# Patient Record
Sex: Male | Born: 1980 | Race: White | Hispanic: No | Smoking: Current every day smoker
Health system: Southern US, Community
[De-identification: ages and names within clinical notes are randomized; demographics above are authoritative.]

## PROBLEM LIST (undated history)

## (undated) DIAGNOSIS — K219 Gastro-esophageal reflux disease without esophagitis: Secondary | ICD-10-CM

## (undated) DIAGNOSIS — F419 Anxiety disorder, unspecified: Secondary | ICD-10-CM

## (undated) DIAGNOSIS — M1990 Unspecified osteoarthritis, unspecified site: Secondary | ICD-10-CM

## (undated) HISTORY — PX: OTHER SURGICAL HISTORY: SHX169

## (undated) HISTORY — PX: BACK SURGERY: SHX140

---

## 2006-06-14 ENCOUNTER — Ambulatory Visit: Payer: Self-pay | Admitting: Internal Medicine

## 2006-09-20 ENCOUNTER — Ambulatory Visit: Payer: Self-pay | Admitting: Physician Assistant

## 2011-12-31 ENCOUNTER — Encounter: Payer: Self-pay | Admitting: Anesthesiology

## 2013-02-21 ENCOUNTER — Ambulatory Visit: Payer: Self-pay | Admitting: Pain Medicine

## 2013-02-24 ENCOUNTER — Ambulatory Visit: Payer: Self-pay | Admitting: Pain Medicine

## 2013-03-14 ENCOUNTER — Ambulatory Visit: Payer: Self-pay | Admitting: Pain Medicine

## 2013-03-20 ENCOUNTER — Ambulatory Visit: Payer: Self-pay | Admitting: Pain Medicine

## 2013-04-18 ENCOUNTER — Ambulatory Visit: Payer: Self-pay | Admitting: Pain Medicine

## 2013-04-24 ENCOUNTER — Ambulatory Visit: Payer: Self-pay | Admitting: Pain Medicine

## 2013-05-06 HISTORY — PX: LUMBAR FUSION: SHX111

## 2013-05-16 ENCOUNTER — Ambulatory Visit: Payer: Self-pay | Admitting: Pain Medicine

## 2013-05-16 ENCOUNTER — Other Ambulatory Visit: Payer: Self-pay | Admitting: Neurosurgery

## 2013-05-23 ENCOUNTER — Encounter (HOSPITAL_COMMUNITY): Payer: Self-pay | Admitting: Pharmacy Technician

## 2013-05-23 NOTE — Pre-Procedure Instructions (Signed)
FERN ASMAR  05/23/2013   Your procedure is scheduled on:  November 24th, Monday   Report to [facility]  2 * 3 at  5:30  AM.   Call this number if you have problems the morning of surgery: [phone]   Remember:   Do not eat food or drink liquids after midnight Sunday.   Take these medicines the morning of surgery with A SIP OF WATER: Lyrica   Do not wear jewelry.  Do not wear lotions, powders, or colognes. You may wear deodorant.   Men may shave face and neck.  Do not bring valuables to the hospital.  [facility] is not responsible for any belongings or valuables.               Contacts, dentures or bridgework may not be worn into surgery.  Leave suitcase in the car. After surgery it may be brought to your room.  For patients admitted to the hospital, discharge time is determined by your treatment team.             Name and phone number of your driver:    Special Instructions: Shower using CHG 2 nights before surgery and the night before surgery.  If you shower the day of surgery use CHG.  Use special wash - you have one bottle of CHG for all showers.  You should use approximately 1/3 of the bottle for each shower.   Please read over the following fact sheets that you were given: Pain Booklet, Blood Transfusion Information, MRSA Information and Surgical Site Infection Prevention

## 2013-05-24 ENCOUNTER — Encounter (HOSPITAL_COMMUNITY): Payer: Self-pay

## 2013-05-24 ENCOUNTER — Encounter (HOSPITAL_COMMUNITY)
Admission: RE | Admit: 2013-05-24 | Discharge: 2013-05-24 | Disposition: A | Discharge status: Home / Self Care | Payer: BC Managed Care – PPO | Source: Ambulatory Visit | Attending: Neurosurgery | Admitting: Neurosurgery

## 2013-05-24 DIAGNOSIS — Z01812 Encounter for preprocedural laboratory examination: Secondary | ICD-10-CM | POA: Insufficient documentation

## 2013-05-24 DIAGNOSIS — Z01818 Encounter for other preprocedural examination: Secondary | ICD-10-CM | POA: Insufficient documentation

## 2013-05-24 HISTORY — DX: Unspecified osteoarthritis, unspecified site: M19.90

## 2013-05-24 HISTORY — DX: Anxiety disorder, unspecified: F41.9

## 2013-05-24 HISTORY — DX: Gastro-esophageal reflux disease without esophagitis: K21.9

## 2013-05-24 LAB — BASIC METABOLIC PANEL
BUN: 7 mg/dL (ref 6–23)
CO2: 26 mEq/L (ref 19–32)
Creatinine, Ser: 0.97 mg/dL (ref 0.50–1.35)
GFR calc Af Amer: >90 mL/min (ref >90)
GFR calc non Af Amer: >90 mL/min (ref >90)
Potassium: 4.6 mEq/L (ref 3.5–5.1)

## 2013-05-24 LAB — SURGICAL PCR SCREEN
MRSA, PCR: NEGATIVE
Staphylococcus aureus: NEGATIVE

## 2013-05-24 LAB — CBC
HCT: 47.4 % (ref 39.0–52.0)
MCHC: 33.8 g/dL (ref 30.0–36.0)
MCV: 87.6 fL (ref 78.0–100.0)
Platelets: 281 10*3/uL (ref 150–400)
RDW: 14 % (ref 11.5–15.5)
WBC: 9.7 10*3/uL (ref 4.0–10.5)

## 2013-05-24 LAB — TYPE AND SCREEN: ABO/RH(D): B POS

## 2013-05-24 LAB — ABO/RH: ABO/RH(D): B POS

## 2013-05-24 NOTE — Progress Notes (Signed)
Denies any cardiac issues, have never seen cardio.Marland Kitchen Has gone to Pain Clinic @ [facility].....basically healthy.  DA

## 2013-05-28 MED ORDER — CEFAZOLIN SODIUM-DEXTROSE 2-3 GM-% IV SOLR: 2.0000 g | INTRAVENOUS | Status: DC

## 2013-05-29 ENCOUNTER — Inpatient Hospital Stay (HOSPITAL_COMMUNITY)
Admission: RE | Admit: 2013-05-29 | Discharge: 2013-05-31 | DRG: 460 | Disposition: A | Discharge status: Home / Self Care | Payer: BC Managed Care – PPO | Source: Ambulatory Visit | Attending: Neurosurgery | Admitting: Neurosurgery

## 2013-05-29 ENCOUNTER — Inpatient Hospital Stay (HOSPITAL_COMMUNITY): Payer: BC Managed Care – PPO | Admitting: Anesthesiology

## 2013-05-29 ENCOUNTER — Inpatient Hospital Stay (HOSPITAL_COMMUNITY): Payer: BC Managed Care – PPO

## 2013-05-29 ENCOUNTER — Encounter (HOSPITAL_COMMUNITY)
Admission: RE | Disposition: A | Discharge status: Home / Self Care | Payer: BC Managed Care – PPO | Source: Ambulatory Visit | Attending: Neurosurgery

## 2013-05-29 ENCOUNTER — Encounter (HOSPITAL_COMMUNITY): Payer: BC Managed Care – PPO | Admitting: Anesthesiology

## 2013-05-29 ENCOUNTER — Encounter (HOSPITAL_COMMUNITY): Payer: Self-pay | Admitting: *Deleted

## 2013-05-29 DIAGNOSIS — Z981 Arthrodesis status: Secondary | ICD-10-CM

## 2013-05-29 DIAGNOSIS — M5137 Other intervertebral disc degeneration, lumbosacral region: Principal | ICD-10-CM | POA: Diagnosis present

## 2013-05-29 DIAGNOSIS — F17200 Nicotine dependence, unspecified, uncomplicated: Secondary | ICD-10-CM | POA: Diagnosis present

## 2013-05-29 DIAGNOSIS — M51379 Other intervertebral disc degeneration, lumbosacral region without mention of lumbar back pain or lower extremity pain: Principal | ICD-10-CM | POA: Diagnosis present

## 2013-05-29 DIAGNOSIS — F411 Generalized anxiety disorder: Secondary | ICD-10-CM | POA: Diagnosis present

## 2013-05-29 DIAGNOSIS — M129 Arthropathy, unspecified: Secondary | ICD-10-CM | POA: Diagnosis present

## 2013-05-29 DIAGNOSIS — M5136 Other intervertebral disc degeneration, lumbar region: Secondary | ICD-10-CM

## 2013-05-29 DIAGNOSIS — K219 Gastro-esophageal reflux disease without esophagitis: Secondary | ICD-10-CM | POA: Diagnosis present

## 2013-05-29 SURGERY — POSTERIOR LUMBAR FUSION 2 LEVEL
Anesthesia: General | Site: Back | Wound class: Clean

## 2013-05-29 MED ORDER — BACITRACIN ZINC 500 UNIT/GM EX OINT
TOPICAL_OINTMENT | CUTANEOUS | Status: DC | PRN
  Administered 2013-05-29: 1 via TOPICAL

## 2013-05-29 MED ORDER — MIDAZOLAM HCL 5 MG/5ML IJ SOLN
INTRAMUSCULAR | Status: DC | PRN
  Administered 2013-05-29 (×2): 2 mg via INTRAVENOUS

## 2013-05-29 MED ORDER — MORPHINE SULFATE 2 MG/ML IJ SOLN: 1.0000 mg | INTRAMUSCULAR | Status: DC | PRN

## 2013-05-29 MED ORDER — NEOSTIGMINE METHYLSULFATE 1 MG/ML IJ SOLN
INTRAMUSCULAR | Status: DC | PRN
  Administered 2013-05-29: 3 mg via INTRAVENOUS

## 2013-05-29 MED ORDER — BUPIVACAINE LIPOSOME 1.3 % IJ SUSP
INTRAMUSCULAR | Status: DC | PRN
  Administered 2013-05-29: 20 mL

## 2013-05-29 MED ORDER — ONDANSETRON HCL 4 MG/2ML IJ SOLN: 4.0000 mg | Freq: Four times a day (QID) | INTRAMUSCULAR | Status: DC | PRN

## 2013-05-29 MED ORDER — DOCUSATE SODIUM 100 MG PO CAPS
100.0000 mg | ORAL_CAPSULE | Freq: Two times a day (BID) | ORAL | Status: DC
  Administered 2013-05-29 – 2013-05-31 (×5): 100 mg via ORAL
  Filled 2013-05-29 (×5): qty 1

## 2013-05-29 MED ORDER — MORPHINE SULFATE (PF) 1 MG/ML IV SOLN
INTRAVENOUS | Status: DC
  Administered 2013-05-29: 13:00:00 via INTRAVENOUS
  Administered 2013-05-29: 15 mg via INTRAVENOUS
  Administered 2013-05-29: 16.5 mg via INTRAVENOUS
  Administered 2013-05-30: 17.58 mg via INTRAVENOUS
  Administered 2013-05-30: 20.41 mg via INTRAVENOUS
  Administered 2013-05-30: 9 mg via INTRAVENOUS
  Filled 2013-05-29 (×3): qty 25

## 2013-05-29 MED ORDER — HYDROMORPHONE HCL PF 1 MG/ML IJ SOLN
INTRAMUSCULAR | Status: AC
  Filled 2013-05-29: qty 1

## 2013-05-29 MED ORDER — OXYCODONE HCL 5 MG/5ML PO SOLN: 5.0000 mg | Freq: Once | ORAL | Status: AC | PRN

## 2013-05-29 MED ORDER — SODIUM CHLORIDE 0.9 % IJ SOLN: 9.0000 mL | INTRAMUSCULAR | Status: DC | PRN

## 2013-05-29 MED ORDER — NALOXONE HCL 0.4 MG/ML IJ SOLN: 0.4000 mg | INTRAMUSCULAR | Status: DC | PRN

## 2013-05-29 MED ORDER — LACTATED RINGERS IV SOLN: INTRAVENOUS | Status: DC

## 2013-05-29 MED ORDER — MORPHINE SULFATE (PF) 1 MG/ML IV SOLN
INTRAVENOUS | Status: AC
  Administered 2013-05-29: 18:00:00
  Filled 2013-05-29: qty 25

## 2013-05-29 MED ORDER — GLYCOPYRROLATE 0.2 MG/ML IJ SOLN
INTRAMUSCULAR | Status: DC | PRN
  Administered 2013-05-29: 0.4 mg via INTRAVENOUS

## 2013-05-29 MED ORDER — DIPHENHYDRAMINE HCL 50 MG/ML IJ SOLN
12.5000 mg | Freq: Four times a day (QID) | INTRAMUSCULAR | Status: DC | PRN
  Administered 2013-05-30: 12.5 mg via INTRAVENOUS
  Filled 2013-05-29: qty 1

## 2013-05-29 MED ORDER — HYDROMORPHONE HCL PF 1 MG/ML IJ SOLN
0.2500 mg | INTRAMUSCULAR | Status: DC | PRN
  Administered 2013-05-29 (×4): 0.5 mg via INTRAVENOUS

## 2013-05-29 MED ORDER — ALUM & MAG HYDROXIDE-SIMETH 200-200-20 MG/5ML PO SUSP
30.0000 mL | Freq: Four times a day (QID) | ORAL | Status: DC | PRN
  Administered 2013-05-30: 30 mL via ORAL
  Filled 2013-05-29: qty 30

## 2013-05-29 MED ORDER — ONDANSETRON HCL 4 MG/2ML IJ SOLN: 4.0000 mg | Freq: Once | INTRAMUSCULAR | Status: DC | PRN

## 2013-05-29 MED ORDER — OXYCODONE-ACETAMINOPHEN 5-325 MG PO TABS
1.0000 | ORAL_TABLET | ORAL | Status: DC | PRN
  Administered 2013-05-29 – 2013-05-31 (×6): 2 via ORAL
  Filled 2013-05-29 (×8): qty 2

## 2013-05-29 MED ORDER — LIDOCAINE HCL (CARDIAC) 20 MG/ML IV SOLN
INTRAVENOUS | Status: DC | PRN
  Administered 2013-05-29: 80 mg via INTRAVENOUS

## 2013-05-29 MED ORDER — HYDROCODONE-ACETAMINOPHEN 5-325 MG PO TABS
1.0000 | ORAL_TABLET | ORAL | Status: DC | PRN
  Administered 2013-05-30 – 2013-05-31 (×2): 2 via ORAL
  Filled 2013-05-29 (×2): qty 2

## 2013-05-29 MED ORDER — PHENOL 1.4 % MT LIQD: 1.0000 | OROMUCOSAL | Status: DC | PRN

## 2013-05-29 MED ORDER — BUPIVACAINE-EPINEPHRINE PF 0.5-1:200000 % IJ SOLN
INTRAMUSCULAR | Status: DC | PRN
  Administered 2013-05-29: 10 mL

## 2013-05-29 MED ORDER — VANCOMYCIN HCL 10 G IV SOLR
1500.0000 mg | Freq: Two times a day (BID) | INTRAVENOUS | Status: DC
  Administered 2013-05-29 – 2013-05-31 (×4): 1500 mg via INTRAVENOUS
  Filled 2013-05-29 (×6): qty 1500

## 2013-05-29 MED ORDER — VECURONIUM BROMIDE 10 MG IV SOLR
INTRAVENOUS | Status: DC | PRN
  Administered 2013-05-29 (×2): 8 mg via INTRAVENOUS

## 2013-05-29 MED ORDER — THROMBIN 20000 UNITS EX SOLR
CUTANEOUS | Status: DC | PRN
  Administered 2013-05-29: 08:00:00 via TOPICAL

## 2013-05-29 MED ORDER — OXYCODONE HCL 5 MG PO TABS
ORAL_TABLET | ORAL | Status: AC
  Filled 2013-05-29: qty 1

## 2013-05-29 MED ORDER — VANCOMYCIN HCL IN DEXTROSE 1-5 GM/200ML-% IV SOLN
INTRAVENOUS | Status: AC
  Administered 2013-05-29: 1000 mg via INTRAVENOUS
  Filled 2013-05-29: qty 200

## 2013-05-29 MED ORDER — DIAZEPAM 5 MG PO TABS
ORAL_TABLET | ORAL | Status: AC
  Administered 2013-05-30: 5 mg via ORAL
  Filled 2013-05-29: qty 1

## 2013-05-29 MED ORDER — PREGABALIN 75 MG PO CAPS
75.0000 mg | ORAL_CAPSULE | Freq: Two times a day (BID) | ORAL | Status: DC
  Filled 2013-05-29 (×4): qty 1

## 2013-05-29 MED ORDER — SODIUM CHLORIDE 0.9 % IR SOLN
Status: DC | PRN
  Administered 2013-05-29: 08:00:00

## 2013-05-29 MED ORDER — DEXAMETHASONE SODIUM PHOSPHATE 10 MG/ML IJ SOLN
INTRAMUSCULAR | Status: AC
  Administered 2013-05-29: 10 mg via INTRAVENOUS
  Filled 2013-05-29: qty 1

## 2013-05-29 MED ORDER — 0.9 % SODIUM CHLORIDE (POUR BTL) OPTIME
TOPICAL | Status: DC | PRN
  Administered 2013-05-29: 1000 mL

## 2013-05-29 MED ORDER — ROCURONIUM BROMIDE 100 MG/10ML IV SOLN
INTRAVENOUS | Status: DC | PRN
  Administered 2013-05-29: 50 mg via INTRAVENOUS

## 2013-05-29 MED ORDER — DIAZEPAM 5 MG PO TABS
5.0000 mg | ORAL_TABLET | Freq: Four times a day (QID) | ORAL | Status: DC | PRN
Start: 2013-05-29 — End: 2013-05-31
  Administered 2013-05-29 – 2013-05-31 (×6): 5 mg via ORAL
  Filled 2013-05-29 (×5): qty 1

## 2013-05-29 MED ORDER — PHENYLEPHRINE HCL 10 MG/ML IJ SOLN
INTRAMUSCULAR | Status: DC | PRN
  Administered 2013-05-29 (×2): 80 ug via INTRAVENOUS
  Administered 2013-05-29: 120 ug via INTRAVENOUS

## 2013-05-29 MED ORDER — ONDANSETRON HCL 4 MG/2ML IJ SOLN
INTRAMUSCULAR | Status: DC | PRN
  Administered 2013-05-29: 4 mg via INTRAVENOUS

## 2013-05-29 MED ORDER — DIPHENHYDRAMINE HCL 12.5 MG/5ML PO ELIX
12.5000 mg | ORAL_SOLUTION | Freq: Four times a day (QID) | ORAL | Status: DC | PRN
  Filled 2013-05-29: qty 10

## 2013-05-29 MED ORDER — ARTIFICIAL TEARS OP OINT
TOPICAL_OINTMENT | OPHTHALMIC | Status: DC | PRN
  Administered 2013-05-29: 1 via OPHTHALMIC

## 2013-05-29 MED ORDER — ONDANSETRON HCL 4 MG/2ML IJ SOLN: 4.0000 mg | INTRAMUSCULAR | Status: DC | PRN

## 2013-05-29 MED ORDER — PROPOFOL 10 MG/ML IV BOLUS
INTRAVENOUS | Status: DC | PRN
  Administered 2013-05-29: 200 mg via INTRAVENOUS

## 2013-05-29 MED ORDER — BUPIVACAINE LIPOSOME 1.3 % IJ SUSP
20.0000 mL | INTRAMUSCULAR | Status: AC
  Filled 2013-05-29: qty 20

## 2013-05-29 MED ORDER — LACTATED RINGERS IV SOLN
INTRAVENOUS | Status: DC | PRN
  Administered 2013-05-29 (×4): via INTRAVENOUS

## 2013-05-29 MED ORDER — FENTANYL CITRATE 0.05 MG/ML IJ SOLN
INTRAMUSCULAR | Status: DC | PRN
  Administered 2013-05-29: 100 ug via INTRAVENOUS
  Administered 2013-05-29: 50 ug via INTRAVENOUS
  Administered 2013-05-29: 150 ug via INTRAVENOUS
  Administered 2013-05-29 (×4): 100 ug via INTRAVENOUS
  Administered 2013-05-29: 50 ug via INTRAVENOUS
  Administered 2013-05-29 (×2): 100 ug via INTRAVENOUS
  Administered 2013-05-29: 50 ug via INTRAVENOUS

## 2013-05-29 MED ORDER — MENTHOL 3 MG MT LOZG: 1.0000 | LOZENGE | OROMUCOSAL | Status: DC | PRN

## 2013-05-29 MED ORDER — OXYCODONE HCL 5 MG PO TABS
5.0000 mg | ORAL_TABLET | Freq: Once | ORAL | Status: AC | PRN
  Administered 2013-05-29: 5 mg via ORAL

## 2013-05-29 MED FILL — Heparin Sodium (Porcine) Inj 1000 Unit/ML: INTRAMUSCULAR | Qty: 30 | Status: AC

## 2013-05-29 MED FILL — Sodium Chloride IV Soln 0.9%: INTRAVENOUS | Qty: 1000 | Status: AC

## 2013-05-29 SURGICAL SUPPLY — 74 items
APL SKNCLS STERI-STRIP NONHPOA (GAUZE/BANDAGES/DRESSINGS) ×1
BAG DECANTER FOR FLEXI CONT (MISCELLANEOUS) ×2 IMPLANT
BENZOIN TINCTURE PRP APPL 2/3 (GAUZE/BANDAGES/DRESSINGS) ×2 IMPLANT
BLADE SURG ROTATE 9660 (MISCELLANEOUS) IMPLANT
BRUSH SCRUB EZ PLAIN DRY (MISCELLANEOUS) ×2 IMPLANT
BUR ACORN 6.0 (BURR) ×2 IMPLANT
BUR MATCHSTICK NEURO 3.0 LAGG (BURR) ×2 IMPLANT
CANISTER SUCT 3000ML (MISCELLANEOUS) ×2 IMPLANT
CAP REVERE LOCKING (Cap) ×6 IMPLANT
CONT SPEC 4OZ CLIKSEAL STRL BL (MISCELLANEOUS) ×3 IMPLANT
COVER BACK TABLE 24X17X13 BIG (DRAPES) IMPLANT
COVER TABLE BACK 60X90 (DRAPES) ×2 IMPLANT
DRAPE C-ARM 42X72 X-RAY (DRAPES) ×4 IMPLANT
DRAPE LAPAROTOMY 100X72X124 (DRAPES) ×2 IMPLANT
DRAPE POUCH INSTRU U-SHP 10X18 (DRAPES) ×2 IMPLANT
DRAPE PROXIMA HALF (DRAPES) ×1 IMPLANT
DRAPE SURG 17X23 STRL (DRAPES) ×8 IMPLANT
ELECT BLADE 4.0 EZ CLEAN MEGAD (MISCELLANEOUS) ×2
ELECT REM PT RETURN 9FT ADLT (ELECTROSURGICAL) ×2
ELECTRODE BLDE 4.0 EZ CLN MEGD (MISCELLANEOUS) ×1 IMPLANT
ELECTRODE REM PT RTRN 9FT ADLT (ELECTROSURGICAL) ×1 IMPLANT
EVACUATOR 1/8 PVC DRAIN (DRAIN) ×1 IMPLANT
GAUZE SPONGE 4X4 16PLY XRAY LF (GAUZE/BANDAGES/DRESSINGS) ×2 IMPLANT
GLOVE BIO SURGEON STRL SZ8 (GLOVE) ×1 IMPLANT
GLOVE BIO SURGEON STRL SZ8.5 (GLOVE) ×4 IMPLANT
GLOVE BIOGEL PI IND STRL 7.5 (GLOVE) IMPLANT
GLOVE BIOGEL PI IND STRL 8 (GLOVE) IMPLANT
GLOVE BIOGEL PI INDICATOR 7.5 (GLOVE) ×2
GLOVE BIOGEL PI INDICATOR 8 (GLOVE) ×4
GLOVE ECLIPSE 7.5 STRL STRAW (GLOVE) ×7 IMPLANT
GLOVE EXAM NITRILE LRG STRL (GLOVE) IMPLANT
GLOVE EXAM NITRILE MD LF STRL (GLOVE) IMPLANT
GLOVE EXAM NITRILE XL STR (GLOVE) IMPLANT
GLOVE EXAM NITRILE XS STR PU (GLOVE) IMPLANT
GLOVE INDICATOR 8.5 STRL (GLOVE) ×1 IMPLANT
GLOVE SS BIOGEL STRL SZ 8 (GLOVE) ×2 IMPLANT
GLOVE SUPERSENSE BIOGEL SZ 8 (GLOVE) ×2
GOWN BRE IMP SLV AUR LG STRL (GOWN DISPOSABLE) IMPLANT
GOWN BRE IMP SLV AUR XL STRL (GOWN DISPOSABLE) ×6 IMPLANT
GOWN STRL REIN 2XL LVL4 (GOWN DISPOSABLE) ×3 IMPLANT
KIT BASIN OR (CUSTOM PROCEDURE TRAY) ×2 IMPLANT
KIT ROOM TURNOVER OR (KITS) ×2 IMPLANT
NDL HYPO 21X1.5 SAFETY (NEEDLE) IMPLANT
NEEDLE HYPO 21X1.5 SAFETY (NEEDLE) ×2 IMPLANT
NEEDLE HYPO 22GX1.5 SAFETY (NEEDLE) ×2 IMPLANT
NS IRRIG 1000ML POUR BTL (IV SOLUTION) ×2 IMPLANT
PACK FOAM VITOSS 10CC (Orthopedic Implant) ×1 IMPLANT
PACK LAMINECTOMY NEURO (CUSTOM PROCEDURE TRAY) ×2 IMPLANT
PAD ARMBOARD 7.5X6 YLW CONV (MISCELLANEOUS) ×7 IMPLANT
PATTIES SURGICAL .5 X1 (DISPOSABLE) IMPLANT
PATTIES SURGICAL 1X1 (DISPOSABLE) ×1 IMPLANT
PUTTY ABX ACTIFUSE 20ML (Putty) ×1 IMPLANT
ROD REVERE 7.5MM (Rod) ×2 IMPLANT
SCREW 7.5X45MM (Screw) ×1 IMPLANT
SCREW 7.5X50MM (Screw) ×5 IMPLANT
SCREW REVERE 6.35 75X55MM (Screw) ×1 IMPLANT
SPACER SUSTAIN O 10X26 13MM (Spacer) ×2 IMPLANT
SPACER SUSTAIN O 10X26 8MM (Spacer) ×2 IMPLANT
SPONGE GAUZE 4X4 12PLY (GAUZE/BANDAGES/DRESSINGS) ×2 IMPLANT
SPONGE LAP 4X18 X RAY DECT (DISPOSABLE) IMPLANT
SPONGE NEURO XRAY DETECT 1X3 (DISPOSABLE) IMPLANT
SPONGE SURGIFOAM ABS GEL 100 (HEMOSTASIS) ×2 IMPLANT
STRIP CLOSURE SKIN 1/2X4 (GAUZE/BANDAGES/DRESSINGS) ×2 IMPLANT
SUT VIC AB 1 CT1 18XBRD ANBCTR (SUTURE) ×2 IMPLANT
SUT VIC AB 1 CT1 8-18 (SUTURE) ×4
SUT VIC AB 2-0 CP2 18 (SUTURE) ×4 IMPLANT
SYR 20CC LL (SYRINGE) ×1 IMPLANT
SYR 20ML ECCENTRIC (SYRINGE) ×2 IMPLANT
TAPE CLOTH SURG 4X10 WHT LF (GAUZE/BANDAGES/DRESSINGS) ×1 IMPLANT
TOWEL OR 17X24 6PK STRL BLUE (TOWEL DISPOSABLE) ×2 IMPLANT
TOWEL OR 17X26 10 PK STRL BLUE (TOWEL DISPOSABLE) ×2 IMPLANT
TRAY FOLEY CATH 14FRSI W/METER (CATHETERS) ×1 IMPLANT
TRAY FOLEY CATH 16FRSI W/METER (SET/KITS/TRAYS/PACK) ×1 IMPLANT
WATER STERILE IRR 1000ML POUR (IV SOLUTION) ×2 IMPLANT

## 2013-05-29 NOTE — Plan of Care (Signed)
Problem: Consults Goal: Diagnosis - Spinal Surgery Fusion

## 2013-05-29 NOTE — Progress Notes (Signed)
ANTIBIOTIC CONSULT NOTE - INITIAL  Pharmacy Consult:  Vancomycin Indication:  Surgical prophylaxis  Allergies  Allergen Reactions  . Amoxicillin Hives and Rash    Patient Measurements: Height: 5' 10.08" (178 cm) Weight: 160 lb 0.9 oz (72.6 kg) IBW/kg (Calculated) : 73.18  Vital Signs: Temp: 96.8 F (36 C) (11/24 1507) Temp src: Oral (11/24 0616) BP: 114/58 mmHg (11/24 1438) Pulse Rate: 96 (11/24 1507) Intake/Output from this shift: Total I/O In: 3500 [I.V.:3500] Out: 1380 [Urine:950; Drains:30; Blood:400]  Labs: No results found for this basename: WBC, HGB, PLT, LABCREA, CREATININE,  in the last 72 hours Estimated Creatinine Clearance: 110.2 ml/min (by C-G formula based on Cr of 0.97). No results found for this basename: VANCOTROUGH, Leodis Binet, VANCORANDOM, GENTTROUGH, GENTPEAK, GENTRANDOM, TOBRATROUGH, TOBRAPEAK, TOBRARND, AMIKACINPEAK, AMIKACINTROU, AMIKACIN,  in the last 72 hours   Microbiology: Recent Results (from the past 720 hour(s))  SURGICAL PCR SCREEN     Status: None   Collection Time    05/24/13  9:18 AM      Result Value Range Status   MRSA, PCR NEGATIVE  NEGATIVE Final   Staphylococcus aureus NEGATIVE  NEGATIVE Final   Comment:            The Xpert SA Assay (FDA     approved for NASAL specimens     in patients over 54 years of age),     is one component of     a comprehensive surveillance     program.  Test performance has     been validated by [employer] for patients greater     than or equal to 1 year old.     It is not intended     to diagnose infection nor to     guide or monitor treatment.    Medical History: Past Medical History  Diagnosis Date  . GERD (gastroesophageal reflux disease)     TAKES OTC  . Arthritis   . Anxiety     IN THE PAST      Assessment: 32 YOM with previous back surgery in 2006 presented with persistent back pain and subsequently undergo lumbar laminectomies and interbody fusion, POD#0.  Pharmacy  consulted to manage vancomycin for surgical prophylaxis while Hemovac still present.  Estimated CrCL 110 ml/min.  Patient received pre-op vancomycin at 0730 today.   Goal of Therapy:  Vancomycin trough level 10-15 mcg/ml   Plan:  - Vanc 1500mg  IV Q12H - Monitor renal fxn, clinical course - F/U Hemovac removal to d/c vanc    Aleksei Goodlin D. Laney Potash, PharmD, BCPS Pager:  [phone] 05/29/2013, 4:08 PM

## 2013-05-29 NOTE — Anesthesia Postprocedure Evaluation (Signed)
  Anesthesia Post-op Note  Patient: Derek [name]  Procedure(s) Performed: Procedure(s) with comments: Lumbar four-five, Lumbar five-Sacral one laminectomies with posterior lumbar interbody fusion with interbody prosthesis, posterior lateral arthrodesis and posterior segmental instrumentation (N/A) - Lumbar four-five, Lumbar five-Sacral one laminectomies with posterior lumbar interbody fusion with interbody prosthesis, posterior lateral arthrodesis and posterior segmental instrumentation  Patient Location: PACU  Anesthesia Type:General  Level of Consciousness: awake, alert  and oriented  Airway and Oxygen Therapy: Patient Spontanous Breathing and Patient connected to nasal cannula oxygen  Post-op Pain: mild  Post-op Assessment: Post-op Vital signs reviewed  Post-op Vital Signs: Reviewed  Complications: No apparent anesthesia complications

## 2013-05-29 NOTE — Anesthesia Preprocedure Evaluation (Signed)
Anesthesia Evaluation  Patient identified by MRN, date of birth, ID band Patient awake    Reviewed: Allergy & Precautions, H&P , NPO status , Patient's Chart, lab work & pertinent test results  Airway Mallampati: I TM Distance: >3 FB Neck ROM: Full    Dental  (+) Teeth Intact and Dental Advisory Given   Pulmonary Current Smoker,  breath sounds clear to auscultation        Cardiovascular Rhythm:Regular Rate:Normal     Neuro/Psych    GI/Hepatic GERD-  Medicated and Controlled,  Endo/Other    Renal/GU      Musculoskeletal   Abdominal   Peds  Hematology   Anesthesia Other Findings   Reproductive/Obstetrics                           Anesthesia Physical Anesthesia Plan  ASA: I  Anesthesia Plan: General   Post-op Pain Management:    Induction: Intravenous  Airway Management Planned: Oral ETT  Additional Equipment:   Intra-op Plan:   Post-operative Plan: Extubation in OR  Informed Consent: I have reviewed the patients History and Physical, chart, labs and discussed the procedure including the risks, benefits and alternatives for the proposed anesthesia with the patient or authorized representative who has indicated his/her understanding and acceptance.   Dental advisory given  Plan Discussed with: CRNA, Anesthesiologist and Surgeon  Anesthesia Plan Comments:         Anesthesia Quick Evaluation

## 2013-05-29 NOTE — H&P (Signed)
Subjective: The patient is a 32 year old white male who's had prior back surgery by another physician in 2006. He has had persistent back pain. He has failed extensive nonsurgical management. He was worked up with a lumbar MRI which demonstrated disc degeneration at L4-5 and L5-S1. I discussed the various treatment options with him including surgery. The patient has weighed the risks, benefits, and alternatives surgery and decided proceed with an L4-5 and L5-S1 decompression, instrumentation, and fusion.   Past Medical History  Diagnosis Date  . GERD (gastroesophageal reflux disease)     TAKES OTC  . Arthritis   . Anxiety     IN THE PAST    Past Surgical History  Procedure Laterality Date  . Back surgery      LUMBAR LAMINECTOMY  . Hand reimplanted      LEFT HAND 1985    Allergies  Allergen Reactions  . Amoxicillin Hives and Rash    History  Substance Use Topics  . Smoking status: Current Every Day Smoker -- 1.00 packs/day for 16 years  . Smokeless tobacco: Not on file  . Alcohol Use: Yes     Comment: VERY RARE, SOCIAL    History reviewed. No pertinent family history. Prior to Admission medications   Medication Sig Start Date End Date Taking? Authorizing Provider  cyclobenzaprine (FLEXERIL) 10 MG tablet Take 10 mg by mouth daily as needed for muscle spasms.   Yes Historical Provider, MD  pregabalin (LYRICA) 75 MG capsule Take 75 mg by mouth 2 (two) times daily.   Yes Historical Provider, MD     Review of Systems  Positive ROS: As above  All other systems have been reviewed and were otherwise negative with the exception of those mentioned in the HPI and as above.  Objective: Vital signs in last 24 hours: Temp:  [98.2 F (36.8 C)] 98.2 F (36.8 C) (11/24 0616) Resp:  [16] 16 (11/24 0616) BP: (122)/(58) 122/58 mmHg (11/24 0616) SpO2:  [98 %] 98 % (11/24 0616)  General Appearance: Alert, cooperative, no distress, appears stated age Head: Normocephalic, without obvious  abnormality, atraumatic Eyes: PERRL, conjunctiva/corneas clear, EOM's intact, fundi benign, both eyes      Ears: Normal TM's and external ear canals, both ears Throat: Lips, mucosa, and tongue normal; teeth and gums normal Neck: Supple, symmetrical, trachea midline, no adenopathy; thyroid: No enlargement/tenderness/nodules; no carotid bruit or JVD Back: Symmetric, no curvature, ROM normal, no CVA tenderness. The patient's lumbar incision is well-healed. Lungs: Clear to auscultation bilaterally, respirations unlabored Heart: Regular rate and rhythm, S1 and S2 normal, no murmur, rub or gallop Abdomen: Soft, non-tender, bowel sounds active all four quadrants, no masses, no organomegaly Extremities: Extremities normal, atraumatic, no cyanosis or edema Pulses: 2+ and symmetric all extremities Skin: Skin color, texture, turgor normal, no rashes or lesions  NEUROLOGIC:   Mental status: alert and oriented, no aphasia, good attention span, Fund of knowledge/ memory ok Motor Exam - grossly normal Sensory Exam - grossly normal Reflexes:  Coordination - grossly normal Gait - grossly normal Balance - grossly normal Cranial Nerves: I: smell Not tested  II: visual acuity  OS: Normal    OD: Normal   II: visual fields Full to confrontation  II: pupils Equal, round, reactive to light  III,VII: ptosis None  III,IV,VI: extraocular muscles  Full ROM  V: mastication Normal  V: facial light touch sensation  Normal  V,VII: corneal reflex  Present  VII: facial muscle function - upper  Normal  VII: facial  muscle function - lower Normal  VIII: hearing Not tested  IX: soft palate elevation  Normal  IX,X: gag reflex Present  XI: trapezius strength  5/5  XI: sternocleidomastoid strength 5/5  XI: neck flexion strength  5/5  XII: tongue strength  Normal    Data Review Lab Results  Component Value Date   WBC 9.7 05/24/2013   HGB 16.0 05/24/2013   HCT 47.4 05/24/2013   MCV 87.6 05/24/2013   PLT 281  05/24/2013   Lab Results  Component Value Date   NA 141 05/24/2013   K 4.6 05/24/2013   CL 105 05/24/2013   CO2 26 05/24/2013   BUN 7 05/24/2013   CREATININE 0.97 05/24/2013   GLUCOSE 84 05/24/2013   No results found for this basename: INR, PROTIME    Assessment/Plan: 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 I have reviewed the MR scan with them and pointed out the abnormalities. We have discussed the various treatment options including surgery. I have described the surgical treatment option of a L4-5 and L5-S1 decompression, agitation, and fusion. I have shown him surgical models. We have discussed the risks, benefits, alternatives, and likelihood of achieving our goals with surgery. I have answered all the patient's questions. He has decided to proceed with surgery.   Ebunoluwa Gernert,Ericson D 05/29/2013 7:18 AM

## 2013-05-29 NOTE — Transfer of Care (Signed)
Immediate Anesthesia Transfer of Care Note  Patient: Derek [name]  Procedure(s) Performed: Procedure(s) with comments: Lumbar four-five, Lumbar five-Sacral one laminectomies with posterior lumbar interbody fusion with interbody prosthesis, posterior lateral arthrodesis and posterior segmental instrumentation (N/A) - Lumbar four-five, Lumbar five-Sacral one laminectomies with posterior lumbar interbody fusion with interbody prosthesis, posterior lateral arthrodesis and posterior segmental instrumentation  Patient Location: PACU  Anesthesia Type:General  Level of Consciousness: sedated, patient cooperative and responds to stimulation  Airway & Oxygen Therapy: Patient Spontanous Breathing and Patient connected to nasal cannula oxygen  Post-op Assessment: Report given to PACU RN, Post -op Vital signs reviewed and stable, Patient moving all extremities and Patient moving all extremities X 4  Post vital signs: Reviewed and stable  Complications: No apparent anesthesia complications

## 2013-05-29 NOTE — Op Note (Signed)
Brief history: The patient is a 32 year old white male who has had prior lumbar laminectomy and discectomy by the physician years ago. He has had persistent back pain and has failed extensive nonsurgical management. He was worked up with a lumbar MRI which demonstrated this degeneration at L4-5 and L5-S1. I discussed the various treatment options with the patient including surgery. He has weighed the risks, benefits, and alternatives surgery and decided proceed with an L4-5 and L5-S1 decompression, agitation, and fusion.  Preoperative diagnosis: L4-5 and L5-S1 Degenerative disc disease, lumbago; lumbar radiculopathy; L4-5 and L5-S1 facet arthropathy, stenosis  Postoperative diagnosis: The same  Procedure: Bilateral L4 and L5 Laminotomy/foraminotomies to decompress the bilateral L4, L5 and S1 nerve roots(the work required to do this was in addition to the work required to do the posterior lumbar interbody fusion because of the patient's spinal stenosis, facet arthropathy. Etc. requiring a wide decompression of the nerve roots.); L4-5 and L5-S1 posterior lumbar interbody fusion with local morselized autograft bone and Actifusebone graft extender; insertion of interbody prosthesis at L4-5 and L5-S1 (globus peek interbody prosthesis); posterior segmental instrumentation from L4 to S1 with globus titanium pedicle screws and rods; posterior lateral arthrodesis at L4-5 and L5-S1 with local morselized autograft bone and Vitoss bone graft extender.  Surgeon: Dr. Delma Officer  Asst.: Dr. Jillyn Hidden cram  Anesthesia: Gen. endotracheal  Estimated blood loss: 200 cc  Drains: One medium Hemovac  Complications: None  Description of procedure: The patient was brought to the operating room by the anesthesia team. General endotracheal anesthesia was induced. The patient was turned to the prone position on the Wilson frame. The patient's lumbosacral region was then prepared with Betadine scrub and Betadine solution.  Sterile drapes were applied.  I then injected the area to be incised with Marcaine with epinephrine solution. I then used the scalpel to make a linear midline incision over the L4-5 and L5-S1 interspace, incising through the patient's prior surgical scar. I then used electrocautery to perform a bilateral subperiosteal dissection exposing the spinous process and lamina of L3-S1. We then obtained intraoperative radiograph to confirm our location. We then inserted the Verstrac retractor to provide exposure.  I began the decompression by using the high speed drill to perform laminotomies at L4 and L5 bilaterally. We did encounter scar tissue from the prior operation at L5 S1-1 left. We then used the Kerrison punches to widen the laminotomy and removed the ligamentum flavum at L4-5 and L5-S1 bilaterally. We carefully dissected through the epidural scar tissue at L5-S1 and performed a foraminotomy about the left S1 nerve root.. We used the Kerrison punches to remove the medial facets at L4-5 and L5-S1 bilaterally. We performed wide foraminotomies about the bilateral L4, L5 and S1 nerve roots completing the decompression.  We now turned our attention to the posterior lumbar interbody fusion. I used a scalpel to incise the intervertebral disc at L4-5 and L5-S1. I then performed a partial intervertebral discectomy at L4-5 and L5-S1 using the pituitary forceps. We prepared the vertebral endplates at L4-5 and L5-S1 for the fusion by removing the soft tissues with the curettes. We then used the trial spacers to pick the appropriate sized interbody prosthesis. We prefilled his prosthesis with a combination of local morselized autograft bone that we obtained during the decompression as well as Actifuse bone graft extender. We inserted the prefilled prosthesis into the interspace at L4-5 and L5-S1. There was a good snug fit of the prosthesis in the interspace. We then filled and the  remainder of the intervertebral disc space  with local morselized autograft bone and Actifuse. This completed the posterior lumbar interbody arthrodesis.  We now turned attention to the instrumentation. Under fluoroscopic guidance we cannulated the bilateral L4, L5 and S1 pedicles with the bone probe. We then removed the bone probe. We then tapped the pedicle with a 0.5 millimeter tap. We then removed the tap. We probed inside the tapped pedicle with a ball probe to rule out cortical breaches. We then inserted a 7.5 x 50 and 55 millimeter pedicle screw into the L4, L5 and S1 pedicles bilaterally under fluoroscopic guidance. We then palpated along the medial aspect of the pedicles to rule out cortical breaches. There were none. The nerve roots were not injured. We then connected the unilateral pedicle screws with a lordotic rod. We compressed the construct and secured the rod in place with the caps. We then tightened the caps appropriately. This completed the instrumentation from L4-S1.  We now turned our attention to the posterior lateral arthrodesis at L4-5 and L5-S1. We used the high-speed drill to decorticate the remainder of the facets, pars, transverse process at L4-5 and L5-S1. We then applied a combination of local morselized autograft bone and Vitoss bone graft extender over these decorticated posterior lateral structures. This completed the posterior lateral arthrodesis.  We then obtained hemostasis using bipolar electrocautery. We irrigated the wound out with bacitracin solution. We inspected the thecal sac and nerve roots and noted they were well decompressed. We then removed the retractor. We placed a medium Hemovac drain in the epidural space and tunneled out through separate stab wound. We reapproximated patient's thoracolumbar fascia with interrupted #1 Vicryl suture. We reapproximated patient's subcutaneous tissue with interrupted 2-0 Vicryl suture. The reapproximated patient's skin with Steri-Strips and benzoin. The wound was then  coated with bacitracin ointment. A sterile dressing was applied. The drapes were removed. The patient was subsequently returned to the supine position where they were extubated by the anesthesia team. He was then transported to the post anesthesia care unit in stable condition. All sponge instrument and needle counts were reportedly correct at the end of this case.

## 2013-05-29 NOTE — Progress Notes (Signed)
Patient ID: Derek [name], male   DOB: 1981/05/24, 32 y.o.   MRN: [id number] Subjective:  The patient is somnolent but easily arousable. He is in no apparent distress.  Objective: Vital signs in last 24 hours: Temp:  [96.8 F (36 C)-98.2 F (36.8 C)] 96.8 F (36 C) (11/24 1217) Resp:  [16] 16 (11/24 0616) BP: (122)/(58) 122/58 mmHg (11/24 0616) SpO2:  [98 %] 98 % (11/24 0616)  Intake/Output from previous day:   Intake/Output this shift: Total I/O In: 3500 [I.V.:3500] Out: 1150 [Urine:750; Blood:400]  Physical exam the patient is somnolent but arousable. He is moving all 4 extremities well.  Lab Results: No results found for this basename: WBC, HGB, HCT, PLT,  in the last 72 hours BMET No results found for this basename: NA, K, CL, CO2, GLUCOSE, BUN, CREATININE, CALCIUM,  in the last 72 hours  Studies/Results: Dg Lumbar Spine 1 View  05/29/2013   CLINICAL DATA:  L4-L5 and L5-S1 lumbar surgery  EXAM: LUMBAR SPINE - 1 VIEW  COMPARISON:  MRI lumbar spine 11/06/2008  FINDINGS: Prior MRI labeled with 5 lumbar vertebrae.  Cross-table lateral view #1 at 0806 hr interpreted at 1156 hr.  Metallic probe via posterior approach projects dorsal to the L5-S1 disc space level.  Tissue spreaders and a surgical sponge are identified dorsal to L4 and L5.  IMPRESSION: Dorsal localization of the L5-S1 disc space level.   Electronically Signed   By: Ulyses Southward M.D.   On: 05/29/2013 11:57    Assessment/Plan: The patient is doing well.  LOS: 0 days     Calissa Swenor,Srihith D 05/29/2013, 12:24 PM

## 2013-05-30 LAB — BASIC METABOLIC PANEL
BUN: 7 mg/dL (ref 6–23)
GFR calc Af Amer: >90 mL/min (ref >90)
GFR calc non Af Amer: >90 mL/min (ref >90)
Glucose, Bld: 131 mg/dL — ABNORMAL HIGH (ref 70–99)
Potassium: 4.3 mEq/L (ref 3.5–5.1)
Sodium: 143 mEq/L (ref 135–145)

## 2013-05-30 LAB — CBC
Hemoglobin: 11.8 g/dL — ABNORMAL LOW (ref 13.0–17.0)
MCH: 29.3 pg (ref 26.0–34.0)
MCHC: 33.6 g/dL (ref 30.0–36.0)
Platelets: 274 10*3/uL (ref 150–400)
WBC: 27.2 10*3/uL — ABNORMAL HIGH (ref 4.0–10.5)

## 2013-05-30 MED ORDER — PANTOPRAZOLE SODIUM 40 MG PO TBEC
40.0000 mg | DELAYED_RELEASE_TABLET | Freq: Every day | ORAL | Status: DC
  Administered 2013-05-30 – 2013-05-31 (×2): 40 mg via ORAL
  Filled 2013-05-30: qty 1

## 2013-05-30 MED ORDER — MORPHINE SULFATE 2 MG/ML IJ SOLN
2.0000 mg | INTRAMUSCULAR | Status: DC | PRN
  Administered 2013-05-30: 2 mg via INTRAVENOUS
  Filled 2013-05-30: qty 1

## 2013-05-30 NOTE — Progress Notes (Signed)
UR complete.  Braniya Farrugia RN, MSN 

## 2013-05-30 NOTE — Evaluation (Signed)
Occupational Therapy Evaluation Patient Details Name: Derek [name] MRN: [id number] DOB: Jan 09, 1981 Today's Date: 05/30/2013 Time: 0272-5366 OT Time Calculation (min): 30 min  OT Assessment / Plan / Recommendation History of present illness pt presents with L4-S1 PLIF.   Clinical Impression   Pt presents with below problem list. Pt moving well during evaluation. Pt will benefit from acute OT to increase independence prior to d/c.    OT Assessment  Patient needs continued OT Services    Follow Up Recommendations  No OT follow up    Barriers to Discharge      Equipment Recommendations  None recommended by OT    Recommendations for Other Services    Frequency  Min 2X/week    Precautions / Restrictions Precautions Precautions: Back;Fall Precaution Booklet Issued: No Precaution Comments: Pt able to state 2/3 back precautions. Required Braces or Orthoses: Spinal Brace Spinal Brace: Lumbar corset;Applied in sitting position Restrictions Weight Bearing Restrictions: No   Pertinent Vitals/Pain Pain 8/10 in back. Increased activity during session.     ADL  Grooming: Wash/dry hands;Min guard (pt bending at knees squatting and then leaning over) Where Assessed - Grooming: Supported standing Upper Body Dressing: Supervision/safety;Set up Where Assessed - Upper Body Dressing: Unsupported sitting Lower Body Dressing: Minimal assistance Where Assessed - Lower Body Dressing: Unsupported sit to stand Toilet Transfer: Supervision/safety Toilet Transfer Method: Sit to stand Toilet Transfer Equipment: Regular height toilet;Bedside commode (stood at comfort height; sit to stand from simulated regular) Toileting - Architect and Hygiene: Supervision/safety Where Assessed - Engineer, mining and Hygiene: Standing Tub/Shower Transfer: Supervision/safety Tub/Shower Transfer Method: Science writer: Walk in shower Equipment Used: Back  brace;Reacher;Long-handled sponge;Long-handled shoe horn;Sock aid Transfers/Ambulation Related to ADLs: Supervision ADL Comments: Pt appeared to be bending when trying to don sock, so educated on AE for LB ADLs. Pt practiced with sockaid. Educated on having grooming items on right side of sink and also using cups for teeth care to avoid breaking precautions. Educated to sit to get LB clothing over feet. Recommended having someone with him when showering and getting in and out of shower. Recommended sitting to bathe. Educated on toilet aid for hygiene. Legs shaky when practicing simulated regular height toilet transfer, so practiced on [facility] as well.     OT Diagnosis: Acute pain  OT Problem List: Decreased knowledge of use of DME or AE;Decreased knowledge of precautions;Pain;Decreased activity tolerance;Impaired balance (sitting and/or standing);Decreased range of motion OT Treatment Interventions: [name]-care/ADL training;DME and/or AE instruction;Therapeutic activities;Patient/family education;Balance training   OT Goals(Current goals can be found in the care plan section) Acute Rehab OT Goals Patient Stated Goal: not stated OT Goal Formulation: With patient Time For Goal Achievement: 06/06/13 Potential to Achieve Goals: Good ADL Goals Pt Will Perform Lower Body Dressing: with modified independence;with adaptive equipment;sit to/from stand Pt Will Transfer to Toilet: with modified independence;ambulating;regular height toilet Pt Will Perform Toileting - Clothing Manipulation and hygiene: with modified independence;sit to/from stand Additional ADL Goal #1: Pt will independently verbalize and demonstrate 3/3 back precautions. Additional ADL Goal #2: Pt will independently perform bed mobility while maintaining back precautions as precursor for ADLs.    Visit Information  Last OT Received On: 05/30/13 Assistance Needed: +1 History of Present Illness: pt presents with L4-S1 PLIF.       Prior  Functioning     Home Living Family/patient expects to be discharged to:: Private residence Living Arrangements: Spouse/significant other;Children Available Help at Discharge: Family;Available 24 hours/day Type of Home: House  Home Access: Stairs to enter Entrance Stairs-Number of Steps: 3 Entrance Stairs-Rails: Right Home Layout: One level Home Equipment: None (thinks he can borrow 3 in 1 if needed) Prior Function Level of Independence: Independent Communication Communication: No difficulties Dominant Hand: Right         Vision/Perception     Cognition  Cognition Arousal/Alertness: Awake/alert Behavior During Therapy: WFL for tasks assessed/performed Overall Cognitive Status: Within Functional Limits for tasks assessed    Extremity/Trunk Assessment Upper Extremity Assessment Upper Extremity Assessment: Overall WFL for tasks assessed Lower Extremity Assessment Lower Extremity Assessment: Defer to PT evaluation     Mobility Bed Mobility Bed Mobility: Rolling Right;Right Sidelying to Sit Rolling Right: 5: Supervision Right Sidelying to Sit: 4: Min guard Details for Bed Mobility Assistance: Pt very close to EOB. Cues for technique. Transfers Transfers: Sit to Stand;Stand to Sit Sit to Stand: 5: Supervision;From chair/3-in-1;From toilet;From bed Stand to Sit: 5: Supervision;To chair/3-in-1;To toilet Details for Transfer Assistance: cues for technique.     Exercise     Balance Balance Balance Assessed: No   End of Session OT - End of Session Equipment Utilized During Treatment: Back brace Activity Tolerance: Patient tolerated treatment well Patient left: Other (comment) (up in room-nurse notified) Nurse Communication: Other (comment) (pt up in room-staggered a few times during session)  GO     Earlie Raveling OTR/L [phone] 05/30/2013, 3:56 PM

## 2013-05-30 NOTE — Progress Notes (Signed)
Patient ID: Derek [name], male   DOB: [birth year], 32 y.o.   MRN: [id number] Subjective:  the patient is alert and pleasant. He looks well. He is in no apparent distress.  Objective: Vital signs in last 24 hours: Temp:  [96.8 F (36 C)-98.5 F (36.9 C)] 98 F (36.7 C) (11/25 0500) Pulse Rate:  [70-117] 70 (11/25 0500) Resp:  [11-22] 16 (11/25 0500) BP: (109-139)/(53-71) 124/70 mmHg (11/25 0500) SpO2:  [89 %-100 %] 97 % (11/25 0500) Weight:  [72.6 kg (160 lb 0.9 oz)] 72.6 kg (160 lb 0.9 oz) (11/24 1507)  Intake/Output from previous day: 11/24 0701 - 11/25 0700 In: 3860 [P.O.:360; I.V.:3500] Out: 5755 [Urine:5150; Drains:205; Blood:400] Intake/Output this shift:    Physical exam patient is alert and oriented x3. His strength is grossly normal his lower extremities.  Lab Results:  Recent Labs  05/30/13 0635  WBC 27.2*  HGB 11.8*  HCT 35.1*  PLT 274   BMET  Recent Labs  05/30/13 0635  NA 143  K 4.3  CL 105  CO2 28  GLUCOSE 131*  BUN 7  CREATININE 0.87  CALCIUM 9.1    Studies/Results: Dg Lumbar Spine 2-3 Views  05/29/2013   CLINICAL DATA:  L4-L5 -S1 laminectomy with posterior interbody fusion  EXAM: LUMBAR SPINE - 2-3 VIEW  COMPARISON:  05/29/2013 at 8:06 a.m.  FINDINGS: Two intraoperative images of the lumbar spine demonstrate pedicle screws in place at L4, L5, and S1 with bilateral interbody spacer markers at L4-5 and left-sided interbody spacer markers at L5-S1. There is perhaps 2-3 mm of posterior subluxation at L4-5 on the lateral projection. No fracture observed. The posterolateral rods have not yet been placed.  IMPRESSION: 1. Pedicle screw and interbody spacers in place as noted above. Minimal posterior subluxation at L4-5, likely degenerative. No complicating feature observed.   Electronically Signed   By: Herbie Baltimore M.D.   On: 05/29/2013 14:00   Dg Lumbar Spine 1 View  05/29/2013   CLINICAL DATA:  L4-L5 and L5-S1 lumbar surgery  EXAM: LUMBAR SPINE -  1 VIEW  COMPARISON:  MRI lumbar spine 11/06/2008  FINDINGS: Prior MRI labeled with 5 lumbar vertebrae.  Cross-table lateral view #1 at 0806 hr interpreted at 1156 hr.  Metallic probe via posterior approach projects dorsal to the L5-S1 disc space level.  Tissue spreaders and a surgical sponge are identified dorsal to L4 and L5.  IMPRESSION: Dorsal localization of the L5-S1 disc space level.   Electronically Signed   By: Ulyses Southward M.D.   On: 05/29/2013 11:57    Assessment/Plan: Postop day #1: The patient is doing well. We will discontinue his Foley catheter, PCA pump, Hemovac drain. We will mobilize him with PT and OT. He may be able to go home tomorrow.   LOS: 1 day     Zakyra Kukuk,Mathayus D 05/30/2013, 7:54 AM

## 2013-05-30 NOTE — Evaluation (Signed)
Physical Therapy Evaluation Patient Details Name: Derek [name] MRN: [id number] DOB: April 29, 1981 Today's Date: 05/30/2013 Time: 1323-1400 PT Time Calculation (min): 37 min  PT Assessment / Plan / Recommendation History of Present Illness  pt presents with L4-S1 PLIF.  Clinical Impression  Pt moving great and anticipate continued great progress.  Pt ed on back precautions and donning/doffing brace.  Pt would benefit from OPPT prior to returning to work.  No further acute care PT at this time.  Will sign off.      PT Assessment  Patent does not need any further PT services    Follow Up Recommendations  No PT follow up    Does the patient have the potential to tolerate intense rehabilitation      Barriers to Discharge        Equipment Recommendations  None recommended by PT    Recommendations for Other Services     Frequency      Precautions / Restrictions Precautions Precautions: Back;Fall Precaution Booklet Issued: Yes (comment) Required Braces or Orthoses: Spinal Brace Spinal Brace: Lumbar corset;Applied in sitting position Restrictions Weight Bearing Restrictions: No   Pertinent Vitals/Pain "hiccups make my back sore"      Mobility  Bed Mobility Bed Mobility: Sit to Sidelying Right Sit to Sidelying Right: 5: Supervision;HOB elevated Details for Bed Mobility Assistance: cues for log roll technique.   Transfers Transfers: Stand to Sit Stand to Sit: 6: Modified independent (Device/Increase time);To bed;With upper extremity assist Details for Transfer Assistance: Demos good safety.   Ambulation/Gait Ambulation/Gait Assistance: 6: Modified independent (Device/Increase time) Ambulation Distance (Feet): 300 Feet Assistive device: None Ambulation/Gait Assistance Details: pt moves slowly, but demos good safety.   Gait Pattern: Step-through pattern;Decreased stride length Stairs: Yes Stairs Assistance: 5: Supervision Stairs Assistance Details (indicate cue type and  reason): Demos good safety and use of rail.   Stair Management Technique: One rail Right;Forwards Number of Stairs: 2 Wheelchair Mobility Wheelchair Mobility: No    Exercises     PT Diagnosis:    PT Problem List:   PT Treatment Interventions:       PT Goals(Current goals can be found in the care plan section)    Visit Information  Last PT Received On: 05/30/13 Assistance Needed: +1 History of Present Illness: pt presents with L4-S1 PLIF.       Prior Functioning  Home Living Family/patient expects to be discharged to:: Private residence Living Arrangements: Spouse/significant other;Children Available Help at Discharge: Family;Available 24 hours/day Type of Home: House Home Access: Stairs to enter [employer] of Steps: 3 Entrance Stairs-Rails: Right Home Layout: One level Home Equipment: None Prior Function Level of Independence: Independent Communication Communication: No difficulties    Cognition  Cognition Arousal/Alertness: Awake/alert Behavior During Therapy: WFL for tasks assessed/performed Overall Cognitive Status: Within Functional Limits for tasks assessed    Extremity/Trunk Assessment Upper Extremity Assessment Upper Extremity Assessment: Defer to OT evaluation Lower Extremity Assessment Lower Extremity Assessment: Overall WFL for tasks assessed   Balance Balance Balance Assessed: No  End of Session PT - End of Session Equipment Utilized During Treatment: Gait belt;Back brace Activity Tolerance: Patient tolerated treatment well Patient left: in bed;with call bell/phone within reach;with family/visitor present Nurse Communication: Mobility status  GP     Derek [name], [place] [phone] 05/30/2013, 2:12 PM

## 2013-05-31 MED ORDER — DSS 100 MG PO CAPS: 100.0000 mg | ORAL_CAPSULE | Freq: Two times a day (BID) | ORAL | Status: DC

## 2013-05-31 MED ORDER — OXYCODONE HCL 5 MG PO TABS
15.0000 mg | ORAL_TABLET | ORAL | Status: DC | PRN
  Administered 2013-05-31: 15 mg via ORAL
  Filled 2013-05-31: qty 3

## 2013-05-31 MED ORDER — DIAZEPAM 5 MG PO TABS: 5.0000 mg | ORAL_TABLET | Freq: Four times a day (QID) | ORAL | Status: DC | PRN

## 2013-05-31 MED ORDER — OXYCODONE HCL 15 MG PO TABS: 15.0000 mg | ORAL_TABLET | ORAL | Status: DC | PRN

## 2013-05-31 NOTE — Discharge Summary (Signed)
Physician Discharge Summary  Patient ID: Derek [name] MRN: [id number] DOB/AGE: 10-02-1980 32 y.o.  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 *   Discharged Condition: good  Hospital Course: I performed a redo L4-5 and L5-S1 laminectomy, decompression, instrumentation, and fusion on the patient on 05/29/2013. The surgery went well.  The patient's postoperative course was unremarkable. On postop day #2 the patient requested discharge to home. He was given oral and written discharge instructions. All his questions were answered.  Consults: PT/OT Significant Diagnostic Studies: None Treatments: L4-5 and L5-S1 decompression, instrumentation, and fusion. Discharge Exam: Blood pressure 121/57, pulse 90, temperature 98.9 F (37.2 C), temperature source Oral, resp. rate 18, height 5' 10.08" (1.78 m), weight 72.6 kg (160 lb 0.9 oz), SpO2 96.00%. The patient is alert and pleasant. He looks well. His strength is normal his lower extremities. His dressing is clean and dry.  Disposition: Home  Discharge Orders   Future Orders Complete By Expires   Call MD for:  difficulty breathing, headache or visual disturbances  As directed    Call MD for:  extreme fatigue  As directed    Call MD for:  hives  As directed    Call MD for:  persistant dizziness or light-headedness  As directed    Call MD for:  persistant nausea and vomiting  As directed    Call MD for:  redness, tenderness, or signs of infection (pain, swelling, redness, odor or green/yellow discharge around incision site)  As directed    Call MD for:  severe uncontrolled pain  As directed    Call MD for:  temperature >100.4  As directed    Diet - low sodium heart healthy  As directed    Discharge instructions  As directed    Comments:     Call  [phone] for a followup appointment. Take a stool softener while you are using pain medications.   Driving Restrictions  As directed    Comments:     Do not drive for 2 weeks.   Increase activity slowly  As directed    Lifting restrictions  As directed    Comments:     Do not lift more than 5 pounds. No excessive bending or twisting.   May shower / Bathe  As directed    Comments:     He may shower after the pain she is removed 3 days after surgery. Leave the incision alone.   Remove dressing in 24 hours  As directed        Medication List    STOP taking these medications       cyclobenzaprine 10 MG tablet  Commonly known as:  FLEXERIL      TAKE these medications       diazepam 5 MG tablet  Commonly known as:  VALIUM  Take 1 tablet (5 mg total) by mouth every 6 (six) hours as needed for muscle spasms.     DSS 100 MG Caps  Take 100 mg by mouth 2 (two) times daily.     oxyCODONE 15 MG immediate release tablet  Commonly known as:  ROXICODONE  Take 1 tablet (15 mg total) by mouth every 4 (four) hours as needed for severe pain.     pregabalin 75 MG capsule  Commonly known as:  LYRICA  Take 75 mg by mouth 2 (  two) times daily.         SignedCristi Loron 05/31/2013, 12:08 PM

## 2013-05-31 NOTE — Progress Notes (Signed)
Occupational Therapy Treatment Patient Details Name: Derek [name] MRN: [id number] DOB: 28-May-1981 Today's Date: 05/31/2013 Time: 8413-2440 OT Time Calculation (min): 16 min  OT Assessment / Plan / Recommendation  History of present illness pt presents with L4-S1 PLIF.   OT comments  Practiced with AE for LB ADLs, toilet transfer, bed mobility and donning brace. Pt in a lot of pain today.   Follow Up Recommendations  No OT follow up    Barriers to Discharge       Equipment Recommendations  Other (comment) (AE kit)    Recommendations for Other Services    Frequency Min 2X/week   Progress towards OT Goals Progress towards OT goals: Progressing toward goals  Plan Discharge plan remains appropriate    Precautions / Restrictions Precautions Precautions: Back;Fall Precaution Booklet Issued: No Precaution Comments: Pt able to state 3/3 back precautions Required Braces or Orthoses: Spinal Brace Spinal Brace: Lumbar corset;Applied in sitting position Restrictions Weight Bearing Restrictions: No   Pertinent Vitals/Pain Pain 9/10. Increased activity during session.      ADL  Upper Body Dressing: Modified independent Where Assessed - Upper Body Dressing: Unsupported sitting (back brace) Lower Body Dressing: Supervision/safety;Set up Where Assessed - Lower Body Dressing: Unsupported sit to stand Toilet Transfer: Supervision/safety Toilet Transfer Method: Sit to Barista: Regular height toilet Equipment Used: Back brace;Long-handled sponge;Reacher;Sock aid Transfers/Ambulation Related to ADLs: Supervision ADL Comments: Pt practiced with AE for LB ADLs. Pt practiced simulated regular height toilet transfer. Pt in a lot of pain today. Recommended sitting to bathe and standing in front of chair/bed when pulling up LB clothing. Told pt the 3 in 1 may be easier for him to use at home for toileting.     OT Diagnosis:    OT Problem List:   OT Treatment  Interventions:     OT Goals(current goals can now be found in the care plan section) Acute Rehab OT Goals Patient Stated Goal: not stated OT Goal Formulation: With patient Time For Goal Achievement: 06/06/13 Potential to Achieve Goals: Good ADL Goals Pt Will Perform Lower Body Dressing: with modified independence;with adaptive equipment;sit to/from stand Pt Will Transfer to Toilet: with modified independence;ambulating;regular height toilet Pt Will Perform Toileting - Clothing Manipulation and hygiene: with modified independence;sit to/from stand Additional ADL Goal #1: Pt will independently verbalize and demonstrate 3/3 back precautions. Additional ADL Goal #2: Pt will independently perform bed mobility while maintaining back precautions as precursor for ADLs.    Visit Information  Last OT Received On: 05/31/13 Assistance Needed: +1 History of Present Illness: pt presents with L4-S1 PLIF.    Subjective Data      Prior Functioning       Cognition  Cognition Arousal/Alertness: Awake/alert Behavior During Therapy: WFL for tasks assessed/performed Overall Cognitive Status: Within Functional Limits for tasks assessed    Mobility  Bed Mobility Bed Mobility: Sit to Sidelying Right;Rolling Left;Rolling Right;Right Sidelying to Sit Rolling Right: 4: Min guard Rolling Left: 5: Supervision Right Sidelying to Sit: 4: Min guard Sit to Sidelying Right: 4: Min guard Details for Bed Mobility Assistance: Pt in lots of pain today. OT educated to get all the way on side before rolling back. Transfers Transfers: Sit to Stand;Stand to Sit Sit to Stand: 5: Supervision;With upper extremity assist;From toilet;From chair/3-in-1;From bed Stand to Sit: 5: Supervision;With upper extremity assist;To chair/3-in-1;To bed;To toilet Details for Transfer Assistance: Supervision for safety. Pt moving very slowly due to pain.    Exercises  Balance     End of Session OT - End of Session Equipment  Utilized During Treatment: Back brace Activity Tolerance: Patient limited by pain Patient left: Other (comment) (up in room-nurse notified) Nurse Communication: Other (comment) (up in room)  GO     Earlie Raveling OTR/L [phone] 05/31/2013, 12:35 PM

## 2013-05-31 NOTE — Progress Notes (Signed)
Patient refused a bath this morning, saying he is in a lot of pain.

## 2013-05-31 NOTE — Progress Notes (Signed)
Patient d/c today, awaited on transportation from family. Assessments remained unchanged as at now. D/C instructions and prescriptions given to patient and education done on back precautions.

## 2014-12-31 ENCOUNTER — Other Ambulatory Visit: Payer: Self-pay | Admitting: Neurosurgery

## 2014-12-31 DIAGNOSIS — G8929 Other chronic pain: Secondary | ICD-10-CM

## 2014-12-31 DIAGNOSIS — M545 Low back pain: Principal | ICD-10-CM

## 2015-01-04 ENCOUNTER — Ambulatory Visit
Admission: RE | Admit: 2015-01-04 | Discharge: 2015-01-04 | Disposition: A | Discharge status: Home / Self Care | Payer: BLUE CROSS/BLUE SHIELD | Source: Ambulatory Visit | Attending: Neurosurgery | Admitting: Neurosurgery

## 2015-01-04 DIAGNOSIS — M545 Low back pain: Secondary | ICD-10-CM | POA: Diagnosis present

## 2015-01-04 DIAGNOSIS — G8929 Other chronic pain: Secondary | ICD-10-CM

## 2015-01-22 ENCOUNTER — Other Ambulatory Visit: Payer: Self-pay | Admitting: Neurosurgery

## 2015-02-11 ENCOUNTER — Other Ambulatory Visit (HOSPITAL_COMMUNITY): Payer: Self-pay | Admitting: *Deleted

## 2015-02-11 NOTE — Pre-Procedure Instructions (Signed)
    AMELIA MACKEN  02/11/2015      Your procedure is scheduled on Wednesday, February 20, 2015 at 9:30 AM.   Report to [facility] Entrance "A" Admitting Office at 6:30 AM.   Call this number if you have problems the morning of surgery: [phone]   Any questions prior to day of surgery, please call [phone] between 8 & 4 PM.   Remember:  Do not eat food or drink liquids after midnight Tuesday, 02/19/15.  Take these medicines the morning of surgery with A SIP OF WATER: Valium - if needed, Oxycodone - if needed   Do not wear jewelry.  Do not wear lotions, powders, or cologne.  You may wear deodorant.             Men may shave face and neck.  Do not bring valuables to the hospital.  [facility] is not responsible for any belongings or valuables.  Contacts, dentures or bridgework may not be worn into surgery.  Leave your suitcase in the car.  After surgery it may be brought to your room.  For patients admitted to the hospital, discharge time will be determined by your treatment team.  Special instructions:  See "Preparing for Surgery" Instruction sheet.   Please read over the following fact sheets that you were given. Pain Booklet, Coughing and Deep Breathing, Blood Transfusion Information, MRSA Information and Surgical Site Infection Prevention

## 2015-02-12 ENCOUNTER — Encounter (HOSPITAL_COMMUNITY)
Admission: RE | Admit: 2015-02-12 | Discharge: 2015-02-12 | Disposition: A | Discharge status: Home / Self Care | Payer: BLUE CROSS/BLUE SHIELD | Source: Ambulatory Visit | Attending: Neurosurgery | Admitting: Neurosurgery

## 2015-02-12 ENCOUNTER — Encounter (HOSPITAL_COMMUNITY): Payer: Self-pay

## 2015-02-12 DIAGNOSIS — Z0183 Encounter for blood typing: Secondary | ICD-10-CM | POA: Insufficient documentation

## 2015-02-12 DIAGNOSIS — Z01812 Encounter for preprocedural laboratory examination: Secondary | ICD-10-CM | POA: Diagnosis not present

## 2015-02-12 DIAGNOSIS — M5136 Other intervertebral disc degeneration, lumbar region: Secondary | ICD-10-CM | POA: Diagnosis not present

## 2015-02-12 LAB — SURGICAL PCR SCREEN
MRSA, PCR: NEGATIVE
STAPHYLOCOCCUS AUREUS: NEGATIVE

## 2015-02-12 LAB — TYPE AND SCREEN
ABO/RH(D): B POS
Antibody Screen: NEGATIVE

## 2015-02-12 LAB — CBC
HCT: 42.3 % (ref 39.0–52.0)
HEMOGLOBIN: 14 g/dL (ref 13.0–17.0)
MCH: 28.5 pg (ref 26.0–34.0)
MCHC: 33.1 g/dL (ref 30.0–36.0)
MCV: 86 fL (ref 78.0–100.0)
PLATELETS: 228 10*3/uL (ref 150–400)
RBC: 4.92 MIL/uL (ref 4.22–5.81)
RDW: 13.6 % (ref 11.5–15.5)
WBC: 9 10*3/uL (ref 4.0–10.5)

## 2015-02-12 NOTE — Progress Notes (Signed)
PCP:Dr. Diannia Ruder at [facility] in [place], [place].  Pt. Stated 2007 he wore a holter monitor over the weekend due to having an extra heart beat. Stated it was related to having excess caffine consumption and he decrease his intake.  No problems since then.

## 2015-02-19 MED ORDER — VANCOMYCIN HCL IN DEXTROSE 1-5 GM/200ML-% IV SOLN
1000.0000 mg | INTRAVENOUS | Status: AC
  Administered 2015-02-20: 1000 mg via INTRAVENOUS
  Filled 2015-02-19: qty 200

## 2015-02-20 ENCOUNTER — Ambulatory Visit (HOSPITAL_COMMUNITY)
Admission: RE | Admit: 2015-02-20 | Discharge: 2015-02-20 | Disposition: A | Discharge status: Home / Self Care | Payer: BLUE CROSS/BLUE SHIELD | Source: Ambulatory Visit | Attending: Neurosurgery | Admitting: Neurosurgery

## 2015-02-20 ENCOUNTER — Inpatient Hospital Stay (HOSPITAL_COMMUNITY)
Admission: AD | Admit: 2015-02-20 | Discharge: 2015-02-21 | DRG: 460 | Disposition: A | Discharge status: Home / Self Care | Payer: BLUE CROSS/BLUE SHIELD | Source: Ambulatory Visit | Attending: Neurosurgery | Admitting: Neurosurgery

## 2015-02-20 ENCOUNTER — Encounter (HOSPITAL_COMMUNITY): Payer: Self-pay | Admitting: Certified Registered Nurse Anesthetist

## 2015-02-20 ENCOUNTER — Inpatient Hospital Stay (HOSPITAL_COMMUNITY): Payer: BLUE CROSS/BLUE SHIELD | Admitting: Certified Registered Nurse Anesthetist

## 2015-02-20 ENCOUNTER — Inpatient Hospital Stay (HOSPITAL_COMMUNITY): Payer: BLUE CROSS/BLUE SHIELD

## 2015-02-20 ENCOUNTER — Encounter (HOSPITAL_COMMUNITY)
Admission: AD | Disposition: A | Discharge status: Home / Self Care | Payer: BLUE CROSS/BLUE SHIELD | Source: Ambulatory Visit | Attending: Neurosurgery

## 2015-02-20 DIAGNOSIS — Z885 Allergy status to narcotic agent status: Secondary | ICD-10-CM

## 2015-02-20 DIAGNOSIS — Z881 Allergy status to other antibiotic agents status: Secondary | ICD-10-CM | POA: Diagnosis not present

## 2015-02-20 DIAGNOSIS — M960 Pseudarthrosis after fusion or arthrodesis: Secondary | ICD-10-CM | POA: Diagnosis present

## 2015-02-20 DIAGNOSIS — M5489 Other dorsalgia: Secondary | ICD-10-CM

## 2015-02-20 DIAGNOSIS — M1990 Unspecified osteoarthritis, unspecified site: Secondary | ICD-10-CM | POA: Diagnosis present

## 2015-02-20 DIAGNOSIS — S32009K Unspecified fracture of unspecified lumbar vertebra, subsequent encounter for fracture with nonunion: Secondary | ICD-10-CM | POA: Diagnosis present

## 2015-02-20 DIAGNOSIS — M4326 Fusion of spine, lumbar region: Secondary | ICD-10-CM

## 2015-02-20 DIAGNOSIS — F17210 Nicotine dependence, cigarettes, uncomplicated: Secondary | ICD-10-CM | POA: Diagnosis present

## 2015-02-20 SURGERY — POSTERIOR LUMBAR FUSION 1 WITH HARDWARE REMOVAL
Anesthesia: General | Site: Back

## 2015-02-20 MED ORDER — ONDANSETRON HCL 4 MG/2ML IJ SOLN: 4.0000 mg | INTRAMUSCULAR | Status: DC | PRN

## 2015-02-20 MED ORDER — MORPHINE SULFATE (PF) 2 MG/ML IV SOLN: 1.0000 mg | INTRAVENOUS | Status: DC | PRN

## 2015-02-20 MED ORDER — MENTHOL 3 MG MT LOZG: 1.0000 | LOZENGE | OROMUCOSAL | Status: DC | PRN

## 2015-02-20 MED ORDER — LACTATED RINGERS IV SOLN
INTRAVENOUS | Status: DC
  Administered 2015-02-20 (×3): via INTRAVENOUS

## 2015-02-20 MED ORDER — VANCOMYCIN HCL IN DEXTROSE 1-5 GM/200ML-% IV SOLN
1000.0000 mg | Freq: Two times a day (BID) | INTRAVENOUS | Status: DC
  Administered 2015-02-20: 1000 mg via INTRAVENOUS
  Filled 2015-02-20 (×3): qty 200

## 2015-02-20 MED ORDER — LIDOCAINE HCL (CARDIAC) 20 MG/ML IV SOLN
INTRAVENOUS | Status: DC | PRN
  Administered 2015-02-20: 60 mg via INTRAVENOUS

## 2015-02-20 MED ORDER — VECURONIUM BROMIDE 10 MG IV SOLR
INTRAVENOUS | Status: AC
  Filled 2015-02-20: qty 10

## 2015-02-20 MED ORDER — ROCURONIUM BROMIDE 100 MG/10ML IV SOLN
INTRAVENOUS | Status: DC | PRN
  Administered 2015-02-20: 50 mg via INTRAVENOUS

## 2015-02-20 MED ORDER — OXYCODONE-ACETAMINOPHEN 10-325 MG PO TABS: 1.0000 | ORAL_TABLET | ORAL | Status: DC | PRN

## 2015-02-20 MED ORDER — SODIUM CHLORIDE 0.9 % IR SOLN
Status: DC | PRN
  Administered 2015-02-20: 500 mL

## 2015-02-20 MED ORDER — MIDAZOLAM HCL 2 MG/2ML IJ SOLN
INTRAMUSCULAR | Status: AC
  Filled 2015-02-20: qty 4

## 2015-02-20 MED ORDER — BUPIVACAINE LIPOSOME 1.3 % IJ SUSP
20.0000 mL | Freq: Once | INTRAMUSCULAR | Status: DC
  Filled 2015-02-20: qty 20

## 2015-02-20 MED ORDER — DEXAMETHASONE SODIUM PHOSPHATE 4 MG/ML IJ SOLN
INTRAMUSCULAR | Status: DC | PRN
  Administered 2015-02-20: 8 mg via INTRAVENOUS

## 2015-02-20 MED ORDER — VANCOMYCIN HCL 1000 MG IV SOLR
INTRAVENOUS | Status: AC
  Filled 2015-02-20: qty 1000

## 2015-02-20 MED ORDER — 0.9 % SODIUM CHLORIDE (POUR BTL) OPTIME
TOPICAL | Status: DC | PRN
  Administered 2015-02-20: 1000 mL

## 2015-02-20 MED ORDER — STERILE WATER FOR INJECTION IJ SOLN
INTRAMUSCULAR | Status: AC
  Filled 2015-02-20: qty 10

## 2015-02-20 MED ORDER — NEOSTIGMINE METHYLSULFATE 10 MG/10ML IV SOLN
INTRAVENOUS | Status: AC
  Filled 2015-02-20: qty 1

## 2015-02-20 MED ORDER — ROCURONIUM BROMIDE 50 MG/5ML IV SOLN
INTRAVENOUS | Status: AC
  Filled 2015-02-20: qty 1

## 2015-02-20 MED ORDER — CYCLOBENZAPRINE HCL 10 MG PO TABS: 10.0000 mg | ORAL_TABLET | Freq: Three times a day (TID) | ORAL | Status: DC | PRN

## 2015-02-20 MED ORDER — ONDANSETRON HCL 4 MG/2ML IJ SOLN
INTRAMUSCULAR | Status: AC
  Filled 2015-02-20: qty 2

## 2015-02-20 MED ORDER — PROPOFOL 10 MG/ML IV BOLUS
INTRAVENOUS | Status: AC
  Filled 2015-02-20: qty 20

## 2015-02-20 MED ORDER — NEOSTIGMINE METHYLSULFATE 10 MG/10ML IV SOLN
INTRAVENOUS | Status: DC | PRN
  Administered 2015-02-20: 3 mg via INTRAVENOUS

## 2015-02-20 MED ORDER — DIAZEPAM 5 MG PO TABS
5.0000 mg | ORAL_TABLET | Freq: Four times a day (QID) | ORAL | Status: DC | PRN
  Administered 2015-02-20 – 2015-02-21 (×3): 5 mg via ORAL
  Filled 2015-02-20 (×3): qty 1

## 2015-02-20 MED ORDER — BUPIVACAINE-EPINEPHRINE (PF) 0.5% -1:200000 IJ SOLN
INTRAMUSCULAR | Status: DC | PRN
  Administered 2015-02-20: 10 mL via PERINEURAL

## 2015-02-20 MED ORDER — GLYCOPYRROLATE 0.2 MG/ML IJ SOLN
INTRAMUSCULAR | Status: DC | PRN
  Administered 2015-02-20: 0.4 mg via INTRAVENOUS

## 2015-02-20 MED ORDER — LACTATED RINGERS IV SOLN: INTRAVENOUS | Status: DC

## 2015-02-20 MED ORDER — BISACODYL 10 MG RE SUPP: 10.0000 mg | Freq: Every day | RECTAL | Status: DC | PRN

## 2015-02-20 MED ORDER — MIDAZOLAM HCL 5 MG/5ML IJ SOLN
INTRAMUSCULAR | Status: DC | PRN
  Administered 2015-02-20: 2 mg via INTRAVENOUS

## 2015-02-20 MED ORDER — SUCCINYLCHOLINE CHLORIDE 20 MG/ML IJ SOLN
INTRAMUSCULAR | Status: AC
  Filled 2015-02-20: qty 1

## 2015-02-20 MED ORDER — DEXAMETHASONE SODIUM PHOSPHATE 4 MG/ML IJ SOLN
INTRAMUSCULAR | Status: AC
  Filled 2015-02-20: qty 2

## 2015-02-20 MED ORDER — GLYCOPYRROLATE 0.2 MG/ML IJ SOLN
INTRAMUSCULAR | Status: AC
  Filled 2015-02-20: qty 2

## 2015-02-20 MED ORDER — THROMBIN 20000 UNITS EX SOLR
CUTANEOUS | Status: DC | PRN
  Administered 2015-02-20: 20 mL via TOPICAL

## 2015-02-20 MED ORDER — FENTANYL CITRATE (PF) 250 MCG/5ML IJ SOLN
INTRAMUSCULAR | Status: AC
  Filled 2015-02-20: qty 5

## 2015-02-20 MED ORDER — ARTIFICIAL TEARS OP OINT
TOPICAL_OINTMENT | OPHTHALMIC | Status: AC
  Filled 2015-02-20: qty 3.5

## 2015-02-20 MED ORDER — ACETAMINOPHEN 325 MG PO TABS: 650.0000 mg | ORAL_TABLET | ORAL | Status: DC | PRN

## 2015-02-20 MED ORDER — BUPIVACAINE LIPOSOME 1.3 % IJ SUSP
INTRAMUSCULAR | Status: DC | PRN
  Administered 2015-02-20: 20 mL

## 2015-02-20 MED ORDER — VANCOMYCIN HCL 1000 MG IV SOLR
INTRAVENOUS | Status: DC | PRN
  Administered 2015-02-20: 1000 mg

## 2015-02-20 MED ORDER — ALUM & MAG HYDROXIDE-SIMETH 200-200-20 MG/5ML PO SUSP
30.0000 mL | Freq: Four times a day (QID) | ORAL | Status: DC | PRN
  Administered 2015-02-21: 30 mL via ORAL
  Filled 2015-02-20: qty 30

## 2015-02-20 MED ORDER — EPHEDRINE SULFATE 50 MG/ML IJ SOLN
INTRAMUSCULAR | Status: AC
  Filled 2015-02-20: qty 1

## 2015-02-20 MED ORDER — LIDOCAINE HCL (CARDIAC) 20 MG/ML IV SOLN
INTRAVENOUS | Status: AC
  Filled 2015-02-20: qty 5

## 2015-02-20 MED ORDER — BACITRACIN ZINC 500 UNIT/GM EX OINT
TOPICAL_OINTMENT | CUTANEOUS | Status: DC | PRN
  Administered 2015-02-20: 1 via TOPICAL

## 2015-02-20 MED ORDER — HYDROMORPHONE HCL 1 MG/ML IJ SOLN
0.2500 mg | INTRAMUSCULAR | Status: DC | PRN
  Administered 2015-02-20: 0.5 mg via INTRAVENOUS

## 2015-02-20 MED ORDER — PROPOFOL 10 MG/ML IV BOLUS
INTRAVENOUS | Status: DC | PRN
  Administered 2015-02-20: 200 mg via INTRAVENOUS

## 2015-02-20 MED ORDER — HYDROMORPHONE HCL 1 MG/ML IJ SOLN
INTRAMUSCULAR | Status: AC
  Filled 2015-02-20: qty 1

## 2015-02-20 MED ORDER — PHENOL 1.4 % MT LIQD: 1.0000 | OROMUCOSAL | Status: DC | PRN

## 2015-02-20 MED ORDER — DOCUSATE SODIUM 100 MG PO CAPS
100.0000 mg | ORAL_CAPSULE | Freq: Two times a day (BID) | ORAL | Status: DC
  Administered 2015-02-20 – 2015-02-21 (×2): 100 mg via ORAL
  Filled 2015-02-20 (×2): qty 1

## 2015-02-20 MED ORDER — ACETAMINOPHEN 650 MG RE SUPP: 650.0000 mg | RECTAL | Status: DC | PRN

## 2015-02-20 MED ORDER — ONDANSETRON HCL 4 MG/2ML IJ SOLN
INTRAMUSCULAR | Status: DC | PRN
  Administered 2015-02-20: 4 mg via INTRAVENOUS

## 2015-02-20 MED ORDER — FENTANYL CITRATE (PF) 100 MCG/2ML IJ SOLN
INTRAMUSCULAR | Status: DC | PRN
  Administered 2015-02-20 (×2): 50 ug via INTRAVENOUS
  Administered 2015-02-20: 100 ug via INTRAVENOUS
  Administered 2015-02-20: 250 ug via INTRAVENOUS
  Administered 2015-02-20: 100 ug via INTRAVENOUS
  Administered 2015-02-20 (×4): 50 ug via INTRAVENOUS

## 2015-02-20 MED ORDER — OXYCODONE-ACETAMINOPHEN 5-325 MG PO TABS
1.0000 | ORAL_TABLET | ORAL | Status: DC | PRN
  Administered 2015-02-20 – 2015-02-21 (×5): 1 via ORAL
  Filled 2015-02-20 (×5): qty 1

## 2015-02-20 MED ORDER — CEFAZOLIN SODIUM-DEXTROSE 2-3 GM-% IV SOLR: 2.0000 g | Freq: Three times a day (TID) | INTRAVENOUS | Status: DC

## 2015-02-20 MED ORDER — VECURONIUM BROMIDE 10 MG IV SOLR
INTRAVENOUS | Status: DC | PRN
  Administered 2015-02-20 (×3): 2 mg via INTRAVENOUS

## 2015-02-20 MED ORDER — OXYCODONE HCL 5 MG PO TABS
5.0000 mg | ORAL_TABLET | ORAL | Status: DC | PRN
  Administered 2015-02-20 – 2015-02-21 (×5): 5 mg via ORAL
  Filled 2015-02-20 (×5): qty 1

## 2015-02-20 SURGICAL SUPPLY — 66 items
APL SKNCLS STERI-STRIP NONHPOA (GAUZE/BANDAGES/DRESSINGS) ×2
BAG DECANTER FOR FLEXI CONT (MISCELLANEOUS) ×4 IMPLANT
BENZOIN TINCTURE PRP APPL 2/3 (GAUZE/BANDAGES/DRESSINGS) ×4 IMPLANT
BLADE CLIPPER SURG (BLADE) IMPLANT
BRUSH SCRUB EZ PLAIN DRY (MISCELLANEOUS) ×4 IMPLANT
BUR MATCHSTICK NEURO 3.0 LAGG (BURR) ×4 IMPLANT
BUR PRECISION FLUTE 6.0 (BURR) ×7 IMPLANT
CANISTER SUCT 3000ML PPV (MISCELLANEOUS) ×4 IMPLANT
CAP REVERE LOCKING (Cap) ×24 IMPLANT
CLOSURE WOUND 1/2 X4 (GAUZE/BANDAGES/DRESSINGS) ×1
CONNECTOR REVERE OFFSET 15MM (Connector) ×6 IMPLANT
CONT SPEC 4OZ CLIKSEAL STRL BL (MISCELLANEOUS) ×4 IMPLANT
COVER BACK TABLE 24X17X13 BIG (DRAPES) IMPLANT
COVER BACK TABLE 60X90IN (DRAPES) ×8 IMPLANT
DRAPE C-ARM 42X72 X-RAY (DRAPES) ×8 IMPLANT
DRAPE LAPAROTOMY 100X72X124 (DRAPES) ×4 IMPLANT
DRAPE POUCH INSTRU U-SHP 10X18 (DRAPES) ×4 IMPLANT
DRAPE PROXIMA HALF (DRAPES) ×4 IMPLANT
DRAPE SCAN PATIENT (DRAPES) ×4 IMPLANT
DRAPE SURG 17X23 STRL (DRAPES) ×16 IMPLANT
ELECT BLADE 4.0 EZ CLEAN MEGAD (MISCELLANEOUS) ×4
ELECT REM PT RETURN 9FT ADLT (ELECTROSURGICAL) ×4
ELECTRODE BLDE 4.0 EZ CLN MEGD (MISCELLANEOUS) ×2 IMPLANT
ELECTRODE REM PT RTRN 9FT ADLT (ELECTROSURGICAL) ×2 IMPLANT
GAUZE SPONGE 4X4 12PLY STRL (GAUZE/BANDAGES/DRESSINGS) ×4 IMPLANT
GAUZE SPONGE 4X4 16PLY XRAY LF (GAUZE/BANDAGES/DRESSINGS) ×4 IMPLANT
GLOVE BIO SURGEON STRL SZ8 (GLOVE) ×8 IMPLANT
GLOVE BIO SURGEON STRL SZ8.5 (GLOVE) ×8 IMPLANT
GLOVE EXAM NITRILE LRG STRL (GLOVE) IMPLANT
GLOVE EXAM NITRILE MD LF STRL (GLOVE) IMPLANT
GLOVE EXAM NITRILE XL STR (GLOVE) IMPLANT
GLOVE EXAM NITRILE XS STR PU (GLOVE) IMPLANT
GOWN STRL REUS W/ TWL LRG LVL3 (GOWN DISPOSABLE) ×1 IMPLANT
GOWN STRL REUS W/ TWL XL LVL3 (GOWN DISPOSABLE) ×4 IMPLANT
GOWN STRL REUS W/TWL 2XL LVL3 (GOWN DISPOSABLE) IMPLANT
GOWN STRL REUS W/TWL LRG LVL3 (GOWN DISPOSABLE) ×4
GOWN STRL REUS W/TWL XL LVL3 (GOWN DISPOSABLE) ×8
KIT BASIN OR (CUSTOM PROCEDURE TRAY) ×4 IMPLANT
KIT INFUSE SMALL (Orthopedic Implant) ×3 IMPLANT
KIT ROOM TURNOVER OR (KITS) ×4 IMPLANT
MARKER SPHERE PSV REFLC 13MM (MARKER) ×17 IMPLANT
NDL HYPO 21X1.5 SAFETY (NEEDLE) IMPLANT
NEEDLE HYPO 21X1.5 SAFETY (NEEDLE) IMPLANT
NEEDLE HYPO 22GX1.5 SAFETY (NEEDLE) ×4 IMPLANT
NS IRRIG 1000ML POUR BTL (IV SOLUTION) ×4 IMPLANT
PACK LAMINECTOMY NEURO (CUSTOM PROCEDURE TRAY) ×4 IMPLANT
PAD ARMBOARD 7.5X6 YLW CONV (MISCELLANEOUS) ×12 IMPLANT
PATTIES SURGICAL .5 X1 (DISPOSABLE) IMPLANT
ROD REVERE 6.35 CURVED 85MM (Rod) ×6 IMPLANT
SCREW REVERE 6.35 8.5X50MM (Screw) ×6 IMPLANT
SCREW REVERE 6.35 9.0X80MM (Screw) ×6 IMPLANT
SPONGE LAP 4X18 X RAY DECT (DISPOSABLE) IMPLANT
SPONGE NEURO XRAY DETECT 1X3 (DISPOSABLE) IMPLANT
SPONGE SURGIFOAM ABS GEL 100 (HEMOSTASIS) ×4 IMPLANT
STRIP BIOACTIVE 20CC 25X100X8 (Miscellaneous) ×3 IMPLANT
STRIP CLOSURE SKIN 1/2X4 (GAUZE/BANDAGES/DRESSINGS) ×3 IMPLANT
SUT VIC AB 1 CT1 18XBRD ANBCTR (SUTURE) ×4 IMPLANT
SUT VIC AB 1 CT1 8-18 (SUTURE) ×8
SUT VIC AB 2-0 CP2 18 (SUTURE) ×8 IMPLANT
SYR 20ML ECCENTRIC (SYRINGE) ×4 IMPLANT
TAPE CLOTH SURG 4X10 WHT LF (GAUZE/BANDAGES/DRESSINGS) ×3 IMPLANT
TAPE STRIPS DRAPE STRL (GAUZE/BANDAGES/DRESSINGS) ×3 IMPLANT
TOWEL OR 17X24 6PK STRL BLUE (TOWEL DISPOSABLE) ×4 IMPLANT
TOWEL OR 17X26 10 PK STRL BLUE (TOWEL DISPOSABLE) ×4 IMPLANT
TRAY FOLEY W/METER SILVER 14FR (SET/KITS/TRAYS/PACK) ×4 IMPLANT
WATER STERILE IRR 1000ML POUR (IV SOLUTION) ×4 IMPLANT

## 2015-02-20 NOTE — Anesthesia Preprocedure Evaluation (Addendum)
Anesthesia Evaluation  Patient identified by MRN, date of birth, ID band Patient awake    Reviewed: Allergy & Precautions, H&P , NPO status , Patient's Chart, lab work & pertinent test results  Airway Mallampati: II  TM Distance: >3 FB Neck ROM: Full    Dental no notable dental hx. (+) Poor Dentition, Dental Advisory Given   Pulmonary Current Smoker,  breath sounds clear to auscultation  Pulmonary exam normal       Cardiovascular negative cardio ROS  Rhythm:Regular Rate:Normal     Neuro/Psych Anxiety negative neurological ROS     GI/Hepatic Neg liver ROS, GERD-  Controlled,  Endo/Other  negative endocrine ROS  Renal/GU negative Renal ROS  negative genitourinary   Musculoskeletal  (+) Arthritis -, Osteoarthritis,    Abdominal   Peds  Hematology negative hematology ROS (+)   Anesthesia Other Findings   Reproductive/Obstetrics negative OB ROS                            Anesthesia Physical Anesthesia Plan  ASA: II  Anesthesia Plan: General   Post-op Pain Management:    Induction: Intravenous  Airway Management Planned: Oral ETT  Additional Equipment:   Intra-op Plan:   Post-operative Plan: Extubation in OR  Informed Consent: I have reviewed the patients History and Physical, chart, labs and discussed the procedure including the risks, benefits and alternatives for the proposed anesthesia with the patient or authorized representative who has indicated his/her understanding and acceptance.   Dental advisory given  Plan Discussed with: CRNA  Anesthesia Plan Comments:         Anesthesia Quick Evaluation

## 2015-02-20 NOTE — Transfer of Care (Signed)
Immediate Anesthesia Transfer of Care Note  Patient: Derek [name]  Procedure(s) Performed: Procedure(s) with comments: L5S1 PLIF ilium screws exploration of fusion (N/A) - L5S1 posterior lumbar interbody fusion with interbody prostheis posterior lateral arthrodesis ilium screws segmental instrumentation exploration of fusion   Patient Location: PACU  Anesthesia Type:General  Level of Consciousness: awake, alert  and oriented  Airway & Oxygen Therapy: Patient Spontanous Breathing and Patient connected to nasal cannula oxygen  Post-op Assessment: Report given to RN, Post -op Vital signs reviewed and stable and Patient moving all extremities X 4  Post vital signs: Reviewed and stable  Last Vitals:  Filed Vitals:   02/20/15 1330  BP: 123/61  Pulse: 94  Temp:   Resp: 23    Complications: No apparent anesthesia complications

## 2015-02-20 NOTE — Anesthesia Procedure Notes (Signed)
Procedure Name: Intubation Date/Time: 02/20/2015 9:34 AM Performed by: Margaree Mackintosh Pre-anesthesia Checklist: Patient identified, Emergency Drugs available, Suction available, Patient being monitored and Timeout performed Patient Re-evaluated:Patient Re-evaluated prior to inductionOxygen Delivery Method: Circle system utilized Preoxygenation: Pre-oxygenation with 100% oxygen Intubation Type: IV induction Ventilation: Mask ventilation without difficulty and Oral airway inserted - appropriate to patient size Laryngoscope Size: Mac and 4 Grade View: Grade I Tube type: Oral Tube size: 7.5 mm Number of attempts: 1 Airway Equipment and Method: Stylet Placement Confirmation: ETT inserted through vocal cords under direct vision,  positive ETCO2 and breath sounds checked- equal and bilateral Secured at: 22 cm Tube secured with: Tape Dental Injury: Teeth and Oropharynx as per pre-operative assessment

## 2015-02-20 NOTE — Anesthesia Postprocedure Evaluation (Signed)
  Anesthesia Post-op Note  Patient: VARDAAN DEPASCALE  Procedure(s) Performed: Procedure(s) with comments: L5S1 PLIF ilium screws exploration of fusion (N/A) - L5S1 posterior lumbar interbody fusion with interbody prostheis posterior lateral arthrodesis ilium screws segmental instrumentation exploration of fusion   Patient Location: PACU  Anesthesia Type:General  Level of Consciousness: awake, alert , oriented and patient cooperative  Airway and Oxygen Therapy: Patient Spontanous Breathing and Patient connected to nasal cannula oxygen  Post-op Pain: mild  Post-op Assessment: Post-op Vital signs reviewed, Patient's Cardiovascular Status Stable, Respiratory Function Stable, Patent Airway, No signs of Nausea or vomiting and Pain level controlled              Post-op Vital Signs: Reviewed and stable  Last Vitals:  Filed Vitals:   02/20/15 1515  BP: 121/53  Pulse: 57  Temp:   Resp: 12    Complications: No apparent anesthesia complications

## 2015-02-20 NOTE — Progress Notes (Signed)
Subjective:  The patient is somnolent but arousable. He is in no apparent distress.  Objective: Vital signs in last 24 hours: Temp:  [97.8 F (36.6 C)] 97.8 F (36.6 C) (08/17 0727) Pulse Rate:  [63] 63 (08/17 0727) Resp:  [16] 16 (08/17 0727) BP: (127)/(69) 127/69 mmHg (08/17 0727) SpO2:  [99 %] 99 % (08/17 0727) Weight:  [73.936 kg (163 lb)] 73.936 kg (163 lb) (08/17 0727)  Intake/Output from previous day:   Intake/Output this shift: Total I/O In: 2000 [I.V.:2000] Out: 400 [Urine:200; Blood:200]  Physical exam the patient is somnolent but arousable. He is moving all 4 extremities well.  Lab Results: No results for input(s): WBC, HGB, HCT, PLT in the last 72 hours. BMET No results for input(s): NA, K, CL, CO2, GLUCOSE, BUN, CREATININE, CALCIUM in the last 72 hours.  Studies/Results: Ct Lumbar Spine Wo Contrast  02/20/2015   CLINICAL DATA:  Brain lab protocol. Preop spine to sacrum fusion. History of back pain with radicular symptoms in both legs.  EXAM: CT LUMBAR SPINE WITHOUT CONTRAST  TECHNIQUE: Multidetector CT imaging of the lumbar spine was performed without intravenous contrast administration. Multiplanar CT image reconstructions were also generated.  COMPARISON:  None.  FINDINGS: 1 mm contiguous images were obtained from L2 through the coccyx with patient prone in preparation for brain lab localization for surgical fusion. The patient has undergone previous L4-S1 posterior and interbody fusion. L4-5 fusion is solid. There is a pseudarthrosis at L5-S1. No solid interbody or posterior arthrodesis. BILATERAL S1 screw loosening. Hardware appropriately placed. No worrisome paravertebral findings. Unremarkable sacroiliac joints. Grossly unremarkable visualized abdominal soft tissues.  IMPRESSION: Brain lab protocol in preparation for fusion revision. Solid L4-5 fusion. L5-S1 pseudarthrosis.   Electronically Signed   By: Elsie Stain M.D.   On: 02/20/2015 08:26     Assessment/Plan: The patient is doing well.  LOS: 0 days     [name],Apolinar D 02/20/2015, 1:27 PM

## 2015-02-20 NOTE — Plan of Care (Signed)
Problem: Consults Goal: Diagnosis - Spinal Surgery Outcome: Completed/Met Date Met:  02/20/15 Thoraco/Lumbar Spine Fusion     

## 2015-02-20 NOTE — Op Note (Signed)
Brief history: The patient is a 34 year old white male on whom I performed an L4-5 and L5-S1 decompression, instrumentation, and fusion about 18 months ago. The patient initially seemed to be improving but developed recurrent back pain. He was worked up with a lumbar x-rays in the lumbar CT which demonstrated findings consistent with a pseudoarthrosis at L5-S1. I discussed the various treatment options with the patient including surgery. He has weighed the risks, benefits, and alternative surgery and decided to proceed with a redo L5-S1 posterior lateral arthrodesis and posterior instrumentation.  Preoperative diagnosis: L5-S1 pseudoarthrosis, lumbago  Postoperative diagnosis: The same  Procedure: Posterior lateral arthrodesis L5-S1 with local morselized autograft bone, Kinnex bone graft extender, and bone morphogenic protein-soaked collagen sponges; posterior segmental instrumentation L4 to the ilium with lowest titanium pedicle screws and rods with BrainLab neuro navigation; exploration of lumbar fusion  Surgeon: Dr. Delma Officer  Asst.: Dr. Romeo Apple Ditty  Anesthesia: Gen. endotracheal  Estimated blood loss: 150 mL  Drains: One medium Hemovac  Complications: None  Description of procedure: The patient was brought to the operating room by the anesthesia team. General endotracheal anesthesia was induced. The patient was turned to the prone position on the Wilson frame. The patient's lumbosacral region was then prepared with Betadine scrub and Betadine solution. Sterile drapes were applied.  I then injected the area to be incised with Marcaine with epinephrine solution. I then used the scalpel to make a linear midline incision over the L4-5 and L5-S1 interspace, incising through the old surgical scar. I then used electrocautery to perform a bilateral subperiosteal dissection exposing the spinous process and lamina of L4, L5 and the sacrum, and exposing the old hardware from L4-S1. We also exposed  the bilateral posterior superior iliac spine. We then inserted the Verstrac retractor to provide exposure.  I explored the old lumbar fusion by removing the caps from the pedicle screws at L4, L5 and S1 bilaterally. The arthrodesis at L4-5 appeared solid. The arthrodesis at L5-S1 did not appear solid the S1 pedicle screws were a bit loose. We removed the S1 pedicle screws.  We now turned attention to the instrumentation. We registered the patient's spine in the [place] computer. Then utilizing BrainLab neuro navigation I identified a good entry point for the ileal screws bilaterally. I used a high-speed drill to decorticate the entry point and to make room for the screw head. We then used the neuronavigation unit to cannulate the ileum with bone probe. I then removed the bone probe and tapped along the track of the bone probe. We then removed the tap. I probed inside the track with a ball probe and noted there was no cortical breaches. We inserted a 10.0 x 80 mm polyaxial screw anteriorly ilium bilaterally. We got good bony purchase. I then replaced the S1 pedicle screws with a 8.5 x 50 mm pedicle screw. There was a good snug fit. We then connected with a unilateral pedicle screws and ileal screw with a lordotic rod bilaterally. We secured the rod in place with the caps were tightened appropriately. This completed the instrumentation.  We now turned our attention to the posterior lateral arthrodesis at L5-S1. We used the high-speed drill to decorticate the remainder of the facets, pars, transverse process at L5-S1. We then applied a combination of local morselized autograft bone, bone morphogenic protein-soaked collagen sponges, and Kinnex bone graft extender over these decorticated posterior lateral structures. This completed the posterior lateral arthrodesis.  We then obtained hemostasis using bipolar electrocautery. We irrigated  the wound out with bacitracin solution. We inspected the thecal sac and  nerve roots and noted they were well decompressed. We then removed the retractor. We placed a medium Hemovac drain in the epidural space and tunneled out through separate stab wound. We placed vancomycin powder in the wound. We reapproximated patient's thoracolumbar fascia with interrupted #1 Vicryl suture. We reapproximated patient's subcutaneous tissue with interrupted 2-0 Vicryl suture. The reapproximated patient's skin with Steri-Strips and benzoin. The wound was then coated with bacitracin ointment. A sterile dressing was applied. The drapes were removed. The patient was subsequently returned to the supine position where they were extubated by the anesthesia team. He was then transported to the post anesthesia care unit in stable condition. All sponge instrument and needle counts were reportedly correct at the end of this case.

## 2015-02-20 NOTE — Progress Notes (Signed)
Orthopedic Tech Progress Note Patient Details:  Derek [name] 1981/07/04 [id number] Brace order completed by bio-tech vendor. Patient ID: Derek [name], male   DOB: April 04, 1981, 34 y.o.   MRN: [id number]   [name] 02/20/2015, 6:24 PM

## 2015-02-21 LAB — BASIC METABOLIC PANEL
Anion gap: 7 (ref 5–15)
BUN: 9 mg/dL (ref 6–20)
CHLORIDE: 108 mmol/L (ref 101–111)
CO2: 28 mmol/L (ref 22–32)
Calcium: 9.4 mg/dL (ref 8.9–10.3)
Creatinine, Ser: 1.01 mg/dL (ref 0.61–1.24)
GFR calc Af Amer: >60 mL/min (ref >60)
GFR calc non Af Amer: >60 mL/min (ref >60)
GLUCOSE: 186 mg/dL — AB (ref 65–99)
POTASSIUM: 3.9 mmol/L (ref 3.5–5.1)
Sodium: 143 mmol/L (ref 135–145)

## 2015-02-21 LAB — CBC
HCT: 39.9 % (ref 39.0–52.0)
HEMOGLOBIN: 13.1 g/dL (ref 13.0–17.0)
MCH: 28.6 pg (ref 26.0–34.0)
MCHC: 32.8 g/dL (ref 30.0–36.0)
MCV: 87.1 fL (ref 78.0–100.0)
Platelets: 248 10*3/uL (ref 150–400)
RBC: 4.58 MIL/uL (ref 4.22–5.81)
RDW: 13.7 % (ref 11.5–15.5)
WBC: 27.5 10*3/uL — ABNORMAL HIGH (ref 4.0–10.5)

## 2015-02-21 MED ORDER — DOCUSATE SODIUM 100 MG PO CAPS: 100.0000 mg | ORAL_CAPSULE | Freq: Two times a day (BID) | ORAL | Status: DC

## 2015-02-21 MED ORDER — OXYCODONE-ACETAMINOPHEN 10-325 MG PO TABS: 1.0000 | ORAL_TABLET | ORAL | Status: DC | PRN

## 2015-02-21 MED ORDER — CYCLOBENZAPRINE HCL 10 MG PO TABS: 10.0000 mg | ORAL_TABLET | Freq: Three times a day (TID) | ORAL | Status: DC | PRN

## 2015-02-21 NOTE — Evaluation (Addendum)
Physical Therapy Evaluation Patient Details Name: Derek [name] MRN: [id number] DOB: 20-May-1981 Today's Date: 02/21/2015   History of Present Illness  Patient is a 34 yo male s/p L5S1 PLIF ilium screws exploration of fusion (N/A) - L5S1 posterior lumbar interbody fusion with interbody prostheis posterior lateral arthrodesis ilium screws segmental instrumentation exploration of fusion.  Clinical Impression  Patient seen for mobility and education. Patient mobilizing well, performed stair negotiation and reviewed all education related to precautions and mobility expectations. At this time, no further acute PT needs. Will sign off.    Follow Up Recommendations No PT follow up    Equipment Recommendations  None recommended by PT    Recommendations for Other Services       Precautions / Restrictions Precautions Precautions: Back Precaution Booklet Issued: Yes (comment) Precaution Comments: educated and verbally reviewed Required Braces or Orthoses: Spinal Brace Spinal Brace: Lumbar corset Restrictions Weight Bearing Restrictions: No      Mobility  Bed Mobility Overal bed mobility: Modified Independent             General bed mobility comments: increased time secondary to pain  Transfers Overall transfer level: Modified independent Equipment used: None             General transfer comment: increased time secondary to pain, no physical assist required  Ambulation/Gait Ambulation/Gait assistance: Independent Ambulation Distance (Feet): 410 Feet Assistive device: None Gait Pattern/deviations: WFL(Within Functional Limits) Gait velocity: decreased Gait velocity interpretation: Below normal speed for age/gender General Gait Details: steady with ambulation, no physical assist required, no need for assistive device  Stairs Stairs: Yes Stairs assistance: Independent Stair Management: Forwards Number of Stairs: 4 General stair comments: no rails, recipricol  technique  Wheelchair Mobility    Modified Rankin (Stroke Patients Only)       Balance                                             Pertinent Vitals/Pain Pain Assessment: 0-10 Pain Score: 8  Pain Location: low back and buttocks Pain Descriptors / Indicators: Aching;Grimacing;Operative site guarding;Sore Pain Intervention(s): Monitored during session    Home Living Family/patient expects to be discharged to:: Private residence Living Arrangements: Spouse/significant other Available Help at Discharge: Family Type of Home: House Home Access: Stairs to enter Entrance Stairs-Rails: Right Entrance Stairs-Number of Steps: 3 Home Layout: One level Home Equipment: Adaptive equipment      Prior Function Level of Independence: Independent               Hand Dominance   Dominant Hand: Right    Extremity/Trunk Assessment   Upper Extremity Assessment: Overall WFL for tasks assessed           Lower Extremity Assessment: Overall WFL for tasks assessed         Communication   Communication: No difficulties  Cognition Arousal/Alertness: Awake/alert Behavior During Therapy: WFL for tasks assessed/performed Overall Cognitive Status: Within Functional Limits for tasks assessed                      General Comments General comments (skin integrity, edema, etc.): educated regarding precuations, safety with mobility, car transfers, and pain management strategies    Exercises        Assessment/Plan    PT Assessment Patent does not need any further PT services  PT Diagnosis Difficulty walking;Acute  pain   PT Problem List Pain  PT Treatment Interventions     PT Goals (Current goals can be found in the Care Plan section) Acute Rehab PT Goals Patient Stated Goal: to go home PT Goal Formulation: All assessment and education complete, DC therapy    Frequency     Barriers to discharge        Co-evaluation               End  of Session Equipment Utilized During Treatment: Back brace Activity Tolerance: Patient tolerated treatment well Patient left: in bed;with call bell/phone within reach;with family/visitor present Nurse Communication: Mobility status    Functional Assessment Tool Used: clinical judgement Functional Limitation: Mobility: Walking and moving around Mobility: Walking and Moving Around Current Status (V7846): At least 1 percent but less than 20 percent impaired, limited or restricted Mobility: Walking and Moving Around Goal Status [phone]): At least 1 percent but less than 20 percent impaired, limited or restricted Mobility: Walking and Moving Around Discharge Status [phone]): At least 1 percent but less than 20 percent impaired, limited or restricted    Time: 0742-0759 PT Time Calculation (min) (ACUTE ONLY): 17 min   Charges:   PT Evaluation $Initial PT Evaluation Tier I: 1 Procedure     PT G Codes:   PT G-Codes **NOT FOR INPATIENT CLASS** Functional Assessment Tool Used: clinical judgement Functional Limitation: Mobility: Walking and moving around Mobility: Walking and Moving Around Current Status (K4401): At least 1 percent but less than 20 percent impaired, limited or restricted Mobility: Walking and Moving Around Goal Status [phone]): At least 1 percent but less than 20 percent impaired, limited or restricted Mobility: Walking and Moving Around Discharge Status [phone]): At least 1 percent but less than 20 percent impaired, limited or restricted    Fabio Asa 02/21/2015, 8:02 AM Charlotte Crumb, PT DPT  [phone]

## 2015-02-21 NOTE — Progress Notes (Signed)
Pt doing well. Pt given D/C instructions with Rx's, verbal understanding was provided. Pt's IV and Hemovac were removed prior to D/C. Pt's dressing was changed per MD order. Pt is stable @ D/C and has no other needs at this time. Pt D/C'd home via walking @ 1135 per MD order. Rema Fendt, RN

## 2015-02-21 NOTE — Discharge Summary (Signed)
Physician Discharge Summary  Patient ID: Derek [name] MRN: [id number] DOB/AGE: 1981-04-11 34 y.o.  Admit date: 02/20/2015 Discharge date: 02/21/2015  Admission Diagnoses: L5-S1 pseudoarthrosis, lumbago  Discharge Diagnoses: The same Active Problems:   Lumbar pseudoarthrosis   Discharged Condition: good  Hospital Course: I performed an exploration of the patient's lumbar fusion with a redo L5-S1 instrumentation and fusion on the patient on 02/20/2015. The surgery went well.  The patient's postoperative course was unremarkable. On postoperative day #1 he requested discharge to home. The patient, and his wife, was given oral and written discharge instructions. All their questions were answered.  Consults: Physical therapy Significant Diagnostic Studies: None Treatments: Exploration of lumbar fusion; L5-S1 posterior lateral arthrodesis, L4-S1 posterior segmental instrumentation Discharge Exam: Blood pressure 123/60, pulse 85, temperature 98.6 F (37 C), temperature source Oral, resp. rate 18, weight 73.936 kg (163 lb), SpO2 97 %. The patient is alert and pleasant. He looks great. His strength is normal in his lower extremities. His strain has clotted off. His dressing has an old bloodstained.  Disposition: Home  Discharge Instructions    Call MD for:  difficulty breathing, headache or visual disturbances    Complete by:  As directed      Call MD for:  extreme fatigue    Complete by:  As directed      Call MD for:  hives    Complete by:  As directed      Call MD for:  persistant dizziness or light-headedness    Complete by:  As directed      Call MD for:  persistant nausea and vomiting    Complete by:  As directed      Call MD for:  redness, tenderness, or signs of infection (pain, swelling, redness, odor or green/yellow discharge around incision site)    Complete by:  As directed      Call MD for:  severe uncontrolled pain    Complete by:  As directed      Call MD for:   temperature >100.4    Complete by:  As directed      Diet - low sodium heart healthy    Complete by:  As directed      Discharge instructions    Complete by:  As directed   Call [phone] for a followup appointment. Take a stool softener while you are using pain medications.     Driving Restrictions    Complete by:  As directed   Do not drive for 2 weeks.     Increase activity slowly    Complete by:  As directed      Lifting restrictions    Complete by:  As directed   Do not lift more than 5 pounds. No excessive bending or twisting.     May shower / Bathe    Complete by:  As directed   He may shower after the pain she is removed 3 days after surgery. Leave the incision alone.     Remove dressing in 48 hours    Complete by:  As directed   Your stitches are under the scan and will dissolve by themselves. The Steri-Strips will fall off after you take a few showers. Do not rub back or pick at the wound, Leave the wound alone.            Medication List    STOP taking these medications        diazepam 5 MG tablet  Commonly known  as:  VALIUM      TAKE these medications        cyclobenzaprine 10 MG tablet  Commonly known as:  FLEXERIL  Take 1 tablet (10 mg total) by mouth 3 (three) times daily as needed for muscle spasms.     docusate sodium 100 MG capsule  Commonly known as:  COLACE  Take 1 capsule (100 mg total) by mouth 2 (two) times daily.     oxyCODONE-acetaminophen 10-325 MG per tablet  Commonly known as:  PERCOCET  Take 1 tablet by oral route every 6 hours as needed for pain     oxyCODONE-acetaminophen 10-325 MG per tablet  Commonly known as:  PERCOCET  Take 1 tablet by mouth every 4 (four) hours as needed for pain.         SignedTressie Stalker D 02/21/2015, 11:06 AM

## 2015-02-21 NOTE — Discharge Instructions (Signed)

## 2015-02-22 MED FILL — Heparin Sodium (Porcine) Inj 1000 Unit/ML: INTRAMUSCULAR | Qty: 30 | Status: AC

## 2015-02-22 MED FILL — Sodium Chloride IV Soln 0.9%: INTRAVENOUS | Qty: 1000 | Status: AC

## 2015-02-26 ENCOUNTER — Encounter (HOSPITAL_COMMUNITY): Payer: Self-pay | Admitting: Neurosurgery

## 2015-02-27 NOTE — H&P (Cosign Needed)
Subjective: The patient is a 34 year old white male on whom I performed a previous L4-5 and L5-S1. He has had chronic back pain. He has failed medical management and was worked up with a lumbar CT. This demonstrated findings consistent with an L5-S1 pseudoarthrosis. I discussed the situation with the patient. We discussed the various treatment options including surgery. He has decided to proceed with an exploration of his fusion with a redo L5-S1 is rotation and fusion.   Past Medical History  Diagnosis Date  . GERD (gastroesophageal reflux disease)     TAKES OTC  . Arthritis   . Anxiety     IN THE PAST    Past Surgical History  Procedure Laterality Date  . Back surgery      LUMBAR LAMINECTOMY  . Hand reimplanted      LEFT HAND 1985  . Lumbar fusion  11/14    Allergies  Allergen Reactions  . Amoxicillin Hives and Rash  . Vicodin [Hydrocodone-Acetaminophen] Hives and Itching    Social History  Substance Use Topics  . Smoking status: Current Every Day Smoker -- 1.00 packs/day for 16 years  . Smokeless tobacco: Not on file  . Alcohol Use: Yes     Comment: VERY RARE, SOCIAL    History reviewed. No pertinent family history. Prior to Admission medications   Medication Sig Start Date End Date Taking? Authorizing Provider  oxyCODONE-acetaminophen (PERCOCET) 10-325 MG per tablet Take 1 tablet by oral route every 6 hours as needed for pain 01/15/15  Yes Historical Provider, MD  cyclobenzaprine (FLEXERIL) 10 MG tablet Take 1 tablet (10 mg total) by mouth 3 (three) times daily as needed for muscle spasms. 02/21/15   Tressie Stalker, MD  docusate sodium (COLACE) 100 MG capsule Take 1 capsule (100 mg total) by mouth 2 (two) times daily. 02/21/15   Tressie Stalker, MD  oxyCODONE-acetaminophen (PERCOCET) 10-325 MG per tablet Take 1 tablet by mouth every 4 (four) hours as needed for pain. 02/21/15   Tressie Stalker, MD     Review of Systems  Positive ROS: As above  All other systems have been  reviewed and were otherwise negative with the exception of those mentioned in the HPI and as above.  Objective: Vital signs in last 24 hours:    General Appearance: Alert, cooperative, no distress, Head: Normocephalic, without obvious abnormality, atraumatic Eyes: PERRL, conjunctiva/corneas clear, EOM's intact,    Ears: Normal  Throat: Normal  Neck: Supple, symmetrical, trachea midline, no adenopathy; thyroid: No enlargement/tenderness/nodules; no carotid bruit or JVD Back: Symmetric, no curvature, ROM normal, no CVA tenderness. The patient's lumbar incision is well-healed. Lungs: Clear to auscultation bilaterally, respirations unlabored Heart: Regular rate and rhythm, no murmur, rub or gallop Abdomen: Soft, non-tender,, no masses, no organomegaly Extremities: Extremities normal, atraumatic, no cyanosis or edema Pulses: 2+ and symmetric all extremities Skin: Skin color, texture, turgor normal, no rashes or lesions  NEUROLOGIC:   Mental status: alert and oriented, no aphasia, good attention span, Fund of knowledge/ memory ok Motor Exam - grossly normal Sensory Exam - grossly normal Reflexes:  Coordination - grossly normal Gait - grossly normal Balance - grossly normal Cranial Nerves: I: smell Not tested  II: visual acuity  OS: Normal  OD: Normal   II: visual fields Full to confrontation  II: pupils Equal, round, reactive to light  III,VII: ptosis None  III,IV,VI: extraocular muscles  Full ROM  V: mastication Normal  V: facial light touch sensation  Normal  V,VII: corneal reflex  Present  VII: facial muscle function - upper  Normal  VII: facial muscle function - lower Normal  VIII: hearing Not tested  IX: soft palate elevation  Normal  IX,X: gag reflex Present  XI: trapezius strength  5/5  XI: sternocleidomastoid strength 5/5  XI: neck flexion strength  5/5  XII: tongue strength  Normal    Data Review Lab Results  Component Value Date   WBC 27.5* 02/21/2015   HGB  13.1 02/21/2015   HCT 39.9 02/21/2015   MCV 87.1 02/21/2015   PLT 248 02/21/2015   Lab Results  Component Value Date   NA 143 02/21/2015   K 3.9 02/21/2015   CL 108 02/21/2015   CO2 28 02/21/2015   BUN 9 02/21/2015   CREATININE 1.01 02/21/2015   GLUCOSE 186* 02/21/2015   No results found for: INR, PROTIME  Assessment/Plan: L5-S1 pseudoarthrosis, lumbago: I have discussed the situation with the patient. I have reviewed his imaging studies with him and pointed out the abnormalities. We have discussed the various treatment options including surgery. I have described the surgical treatment option of L5-S1 redo fusion with an exploration of his fusion. I have described the surgery to him. I have shown surgical models. We have discussed the risks, benefits, alternatives, and likelihood of achieving goals with surgery. I have answered all his questions. He has decided to proceed with surgery.   Hyun Reali,Eliah D 02/27/2015 8:29 AM

## 2015-04-18 ENCOUNTER — Other Ambulatory Visit: Payer: Self-pay | Admitting: Neurosurgery

## 2015-04-18 DIAGNOSIS — S32009K Unspecified fracture of unspecified lumbar vertebra, subsequent encounter for fracture with nonunion: Secondary | ICD-10-CM

## 2015-04-19 ENCOUNTER — Ambulatory Visit
Admission: RE | Admit: 2015-04-19 | Discharge: 2015-04-19 | Disposition: A | Discharge status: Home / Self Care | Payer: BLUE CROSS/BLUE SHIELD | Source: Ambulatory Visit | Attending: Neurosurgery | Admitting: Neurosurgery

## 2015-04-19 DIAGNOSIS — M545 Low back pain: Secondary | ICD-10-CM | POA: Diagnosis not present

## 2015-04-19 DIAGNOSIS — S32009K Unspecified fracture of unspecified lumbar vertebra, subsequent encounter for fracture with nonunion: Secondary | ICD-10-CM

## 2016-09-02 ENCOUNTER — Emergency Department
Admission: EM | Admit: 2016-09-02 | Discharge: 2016-09-02 | Disposition: A | Discharge status: Home / Self Care | Payer: BLUE CROSS/BLUE SHIELD | Attending: Emergency Medicine | Admitting: Emergency Medicine

## 2016-09-02 ENCOUNTER — Encounter: Payer: Self-pay | Admitting: Emergency Medicine

## 2016-09-02 DIAGNOSIS — F17200 Nicotine dependence, unspecified, uncomplicated: Secondary | ICD-10-CM | POA: Insufficient documentation

## 2016-09-02 DIAGNOSIS — Z79899 Other long term (current) drug therapy: Secondary | ICD-10-CM | POA: Insufficient documentation

## 2016-09-02 DIAGNOSIS — F410 Panic disorder [episodic paroxysmal anxiety] without agoraphobia: Secondary | ICD-10-CM | POA: Insufficient documentation

## 2016-09-02 MED ORDER — LORAZEPAM 0.5 MG PO TABS: 0.5000 mg | ORAL_TABLET | Freq: Four times a day (QID) | ORAL | 0 refills | Status: AC | PRN

## 2016-09-02 MED ORDER — LORAZEPAM 0.5 MG PO TABS
0.5000 mg | ORAL_TABLET | Freq: Once | ORAL | Status: AC
  Administered 2016-09-02: 0.5 mg via ORAL
  Filled 2016-09-02: qty 1

## 2016-09-02 NOTE — Discharge Instructions (Signed)
Please return to the emergency department if you develop thoughts of hurting herself or anyone else, hallucinations, severe pain, visual or speech changes, numbness tingling or weakness, chest pain, lightheadedness or fainting, or any other symptoms concerning to you.

## 2016-09-02 NOTE — ED Provider Notes (Signed)
[facility] Emergency Department Provider Note  ____________________________________________  Time seen: Approximately 4:49 PM  I have reviewed the triage vital signs and the nursing notes.   HISTORY  Chief Complaint Panic Attack    HPI Derek [name] is a 36 y.o. male with a history of generalized anxiety disorder presenting with panic attacks. The patient reports that he has been diagnosed with anxiety in the past and was on medication, but has not been taking it for a long time. Over the past 3 days, he has had increased stress, and has had multiple episodes that are typical of his previous panic attacks. He describes a sensation of "not being able to take a deep breath" associated with tightness in his chest. He denies any palpitations, lightheadedness or syncope. No fevers or chills. He has no SI, HI or hallucinations.   Past Medical History:  Diagnosis Date  . Anxiety    IN THE PAST  . Arthritis   . GERD (gastroesophageal reflux disease)    TAKES OTC    Patient Active Problem List   Diagnosis Date Noted  . 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 BACK SURGERY     LUMBAR LAMINECTOMY  . HAND REIMPLANTED     LEFT HAND 1985  . LUMBAR FUSION  11/14    Current Outpatient Rx  . Order #: [id number] Class: Normal  . Order #: [id number] Class: Normal  . Order #: [id number] Class: Historical Med  . Order #: [id number] Class: Print    Allergies Amoxicillin and Vicodin [hydrocodone-acetaminophen]  No family history on file.  Social History Social History  Substance Use Topics  . Smoking status: Current Every Day Smoker    Packs/day: 1.00    Years: 16.00  . Smokeless tobacco: Never Used  . Alcohol use Yes     Comment: VERY RARE, SOCIAL    Review of Systems Constitutional: No fever/chills.No lightheadedness or syncope. Eyes: No visual changes. ENT: No sore throat. No congestion or  rhinorrhea. Cardiovascular: Positive chest pain. Denies palpitations. Respiratory: Positive shortness of breath.  No cough. Gastrointestinal: No abdominal pain.  No nausea, no vomiting.  No diarrhea.  No constipation. Genitourinary: Negative for dysuria. Musculoskeletal: Negative for back pain. Skin: Negative for rash. Neurological: Negative for headaches. No focal numbness, tingling or weakness.  Psychiatric:Positive panic attacks. Negative SI, HI or hallucinations on my examination.  10-point ROS otherwise negative.  ____________________________________________   PHYSICAL EXAM:  VITAL SIGNS: ED Triage Vitals  Enc Vitals Group     BP 09/02/16 1611 114/75     Pulse Rate 09/02/16 1611 71     Resp 09/02/16 1611 17     Temp 09/02/16 1611 97.9 F (36.6 C)     Temp Source 09/02/16 1611 Oral     SpO2 09/02/16 1611 98 %     Weight 09/02/16 1612 175 lb (79.4 kg)     Height 09/02/16 1612 5\' 10"  (1.778 m)     Head Circumference --      Peak Flow --      Pain Score 09/02/16 1612 3     Pain Loc --      Pain Edu? --      Excl. in GC? --     Constitutional: Alert and oriented. Well appearing and in no acute distress. Answers questions appropriately. Eyes: Conjunctivae are normal.  EOMI. No scleral icterus. Head: Atraumatic. Nose: No congestion/rhinnorhea. Mouth/Throat: Mucous membranes are moist.  Neck: No stridor.  Supple.  No JVD. No meningismus. Cardiovascular: Normal rate, regular rhythm. No murmurs, rubs or gallops.  Respiratory: Normal respiratory effort.  No accessory muscle use or retractions. Lungs CTAB.  No wheezes, rales or ronchi. Gastrointestinal: Soft, nontender and nondistended.  No guarding or rebound.  No peritoneal signs. Musculoskeletal: No LE edema. . Neurologic:  A&Ox3.  Speech is clear.  Face and smile are symmetric.  EOMI.  Moves all extremities well. Skin:  Skin is warm, dry and intact. No rash noted. Psychiatric: Mood and affect are normal. The patient  does not have any evidence of acute anxiety on my examination. His speech is normal and he does not have any pressured speech or tangential thinking. Speech and behavior are normal.  Normal judgement. My examination, the patient denies any SI, HI or hallucinations.  ____________________________________________   LABS (all labs ordered are listed, but only abnormal results are displayed)  Labs Reviewed - No data to display ____________________________________________  EKG  ED ECG REPORT I, Rockne Menghini, the attending physician, personally viewed and interpreted this ECG.   Date: 09/02/2016  EKG Time: 1604  Rate: 67  Rhythm: normal sinus rhythm  Axis: normal  Intervals:none  ST&T Change: Nonspecific T-wave inversions in V1. No ST elevation.  ____________________________________________  RADIOLOGY  No results found.  ____________________________________________   PROCEDURES  Procedure(s) performed: None  Procedures  Critical Care performed: No ____________________________________________   INITIAL IMPRESSION / ASSESSMENT AND PLAN / ED COURSE  Pertinent labs & imaging results that were available during my care of the patient were reviewed by me and considered in my medical decision making (see chart for details).  36 y.o. male with a history of panic attacks off his medications presenting with 3 days of intermittent panic attacks. Overall, the patient's vital signs are reassuring and his cardiopulmonary examination is normal. He does not have any red flag warnings for acute psychiatric illness requiring hospitalization. His EKG does not show ischemic changes. I have talked to our TTS service, who will see the patient for recommendations of outpatient treatment for his symptoms. We discussed return precautions as well as follow-up instructions.  ----------------------------------------- 5:23 PM on 09/02/2016 -----------------------------------------  The  patient has been seen and evaluated by TTS who have given him outpatient resources for his panic attacks. Plan discharge.  ____________________________________________  FINAL CLINICAL IMPRESSION(S) / ED DIAGNOSES  Final diagnoses:  Panic attacks         NEW MEDICATIONS STARTED DURING THIS VISIT:  New Prescriptions   No medications on file      Rockne Menghini, MD 09/02/16 1723

## 2016-09-02 NOTE — ED Triage Notes (Signed)
Pt comes into the Ed via POV c/o panic attacks that started about 2 days ago.  States he has episodes of shortness of breath associated with it.  Denies any chest pain.  Patient states he has had an increase in stress lately.  Patient in NAD at this time with even and unlabored respirations.

## 2016-09-02 NOTE — ED Notes (Signed)
Patient is alert/ oriented/clam/cooperative and denies SI/HI/Psychosis.   Discussed patient with ER MD (Dr. Sharma CovertNorman) and patient is able to discharge home when medically cleared. Patient was giving referral information and instructions on how to follow up with Outpatient Treatment (as well as several private practice locations, [facility] and [facility]) and [facility].  Writer also advised the patient to call the toll free phone on his insurance card for the counselors and agencies in his network.

## 2018-01-13 ENCOUNTER — Ambulatory Visit: Payer: BLUE CROSS/BLUE SHIELD | Admitting: Nurse Practitioner

## 2018-01-13 DIAGNOSIS — Z720 Tobacco use: Secondary | ICD-10-CM | POA: Insufficient documentation

## 2018-01-13 NOTE — Progress Notes (Deleted)
Patient's Name: Derek [name]  MRN: [id number]  Referring Provider: Jodi Marble, MD  DOB: [birth year]  PCP: Derek Marble, MD  DOS: 01/13/2018  Note by: Derek David NP  Service setting: Ambulatory outpatient  Specialty: Interventional Pain Management  Location: [facility] (AMB) Pain Management Facility    Patient type: New Patient    Primary Reason(s) for Visit: Initial Patient Evaluation CC: No chief complaint on file.  HPI  Derek [name] is a 37 y.o. year old, male patient, who comes today for an initial evaluation. He has Lumbar pseudoarthrosis; Chronic back pain; and Tobacco use on their problem list.. His primarily concern today is the No chief complaint on file.  Pain Assessment: Location:     Radiating:   Onset:   Duration:   Quality:   Severity:  /10 (subjective, self-reported pain score)  Note: Reported level is compatible with observation.                         When using our objective Pain Scale, levels between 6 and 10/10 are said to belong in an emergency room, as it progressively worsens from a 6/10, described as severely limiting, requiring emergency care not usually available at an outpatient pain management facility. At a 6/10 level, communication becomes difficult and requires great effort. Assistance to reach the emergency department may be required. Facial flushing and profuse sweating along with potentially dangerous increases in heart rate and blood pressure will be evident. Effect on ADL:   Timing:   Modifying factors:   BP:    HR:    Onset and Duration: {Hx; Onset and Duration:210120511} Cause of pain: {Hx; Cause:210120521} Severity: {Pain Severity:210120502} Timing: {Symptoms; Timing:210120501} Aggravating Factors: {Causes; Aggravating pain factors:210120507} Alleviating Factors: {Causes; Alleviating Factors:210120500} Associated Problems: {Hx; Associated problems:210120515} Quality of Pain: {Hx; Symptom quality or Descriptor:210120531} Previous  Examinations or Tests: {Hx; Previous examinations or test:210120529} Previous Treatments: {Hx; Previous Treatment:210120503}  The patient comes into the clinics today for the first time for a chronic pain management evaluation. ***  Today I took the time to provide the patient with information regarding this pain practice. The patient was informed that the practice is divided into two sections: an interventional pain management section, as well as a completely separate and distinct medication management section. I explained that there are procedure days for interventional therapies, and evaluation days for follow-ups and medication management. Because of the amount of documentation required during both, they are kept separated. This means that there is the possibility that *** may be scheduled for a procedure on one day, and medication management the next. I have also informed *** that because of staffing and facility limitations, this practice will no longer take patients for medication management only. To illustrate the reasons for this, I gave the patient the example of surgeons, and how inappropriate it would be to refer a patient to his/her care, just to write for the post-surgical antibiotics on a surgery done by a different surgeon.   Because interventional pain management is part of the board-certified specialty for the doctors, the patient was informed that joining this practice means that they are open to any and all interventional therapies. I made it clear that this does not mean that they will be forced to have any procedures done. What this means is that I believe interventional therapies to be essential part of the diagnosis and proper management of chronic pain conditions. Therefore, patients not interested in these interventional  alternatives will be better served under the care of a different practitioner.  The patient was also made aware of my Comprehensive Pain Management Safety  Guidelines where by joining this practice, they limit all of their nerve blocks and joint injections to those done by our practice, for as long as we are retained to manage their care. Historic Controlled Substance Pharmacotherapy Review  PMP and historical list of controlled substances: *** Highest opioid analgesic regimen found: *** Most recent opioid analgesic: *** Current opioid analgesics: *** Highest recorded MME/day: *** mg/day MME/day: *** mg/day Medications: The patient did not bring the medication(s) to the appointment, as requested in our "New Patient Package" Pharmacodynamics: Desired effects: Analgesia: The patient reports >50% benefit. Reported improvement in function: The patient reports medication allows him to accomplish basic ADLs. Clinically meaningful improvement in function (CMIF): Sustained CMIF goals met Perceived effectiveness: Described as relatively effective, allowing for increase in activities of daily living (ADL) Undesirable effects: Side-effects or Adverse reactions: None reported Historical Monitoring: The patient  reports that he does not use drugs. List of all UDS Test(s): No results found for: MDMA, COCAINSCRNUR, PCPSCRNUR, PCPQUANT, CANNABQUANT, THCU, Quinter List of all Serum Drug Screening Test(s):  No results found for: AMPHSCRSER, BARBSCRSER, BENZOSCRSER, COCAINSCRSER, PCPSCRSER, PCPQUANT, THCSCRSER, CANNABQUANT, OPIATESCRSER, OXYSCRSER, PROPOXSCRSER Historical Background Evaluation: [place] PDMP: Six (6) year initial data search conducted.             [place] Department of public safety, offender search: Editor, commissioning Information) Non-contributory Risk Assessment Profile: Aberrant behavior: None observed or detected today Risk factors for fatal opioid overdose: None identified today Fatal overdose hazard ratio (HR): Calculation deferred Non-fatal overdose hazard ratio (HR): Calculation deferred Risk of opioid abuse or dependence: 0.7-3.0% with doses ? 36 MME/day  and 6.1-26% with doses ? 120 MME/day. Substance use disorder (SUD) risk level: Pending results of Medical Psychology Evaluation for SUD Opioid risk tool (ORT) (Total Score):    ORT Scoring interpretation table:  Score <3 = Low Risk for SUD  Score between 4-7 = Moderate Risk for SUD  Score >8 = High Risk for Opioid Abuse   PHQ-2 Depression Scale:  Total score:    PHQ-2 Scoring interpretation table: (Score and probability of major depressive disorder)  Score 0 = No depression  Score 1 = 15.4% Probability  Score 2 = 21.1% Probability  Score 3 = 38.4% Probability  Score 4 = 45.5% Probability  Score 5 = 56.4% Probability  Score 6 = 78.6% Probability   PHQ-9 Depression Scale:  Total score:    PHQ-9 Scoring interpretation table:  Score 0-4 = No depression  Score 5-9 = Mild depression  Score 10-14 = Moderate depression  Score 15-19 = Moderately severe depression  Score 20-27 = Severe depression (2.4 times higher risk of SUD and 2.89 times higher risk of overuse)   Pharmacologic Plan: Pending ordered tests and/or consults  Meds  The patient has a current medication list which includes the following prescription(s): cyclobenzaprine, docusate sodium, oxycodone-acetaminophen, and oxycodone-acetaminophen.  Current Outpatient Medications on File Prior to Visit  Medication Sig  . cyclobenzaprine (FLEXERIL) 10 MG tablet Take 1 tablet (10 mg total) by mouth 3 (three) times daily as needed for muscle spasms.  Marland Kitchen docusate sodium (COLACE) 100 MG capsule Take 1 capsule (100 mg total) by mouth 2 (two) times daily.  Marland Kitchen oxyCODONE-acetaminophen (PERCOCET) 10-325 MG per tablet Take 1 tablet by oral route every 6 hours as needed for pain  . oxyCODONE-acetaminophen (PERCOCET) 10-325 MG per tablet Take  1 tablet by mouth every 4 (four) hours as needed for pain.   No current facility-administered medications on file prior to visit.    Imaging Review  Cervical Imaging: Cervical MR wo contrast: No results  found for this or any previous visit. Cervical MR wo contrast: No procedure found. Cervical MR w/wo contrast: No results found for this or any previous visit. Cervical MR w contrast: No results found for this or any previous visit. Cervical CT wo contrast: No results found for this or any previous visit. Cervical CT w/wo contrast: No results found for this or any previous visit. Cervical CT w/wo contrast: No results found for this or any previous visit. Cervical CT w contrast: No results found for this or any previous visit. Cervical CT outside: No results found for this or any previous visit. Cervical DG 1 view: No results found for this or any previous visit. Cervical DG 2-3 views: No results found for this or any previous visit. Cervical DG F/E views: No results found for this or any previous visit. Cervical DG 2-3 clearing views: No results found for this or any previous visit. Cervical DG Bending/F/E views: No results found for this or any previous visit. Cervical DG complete: No results found for this or any previous visit. Cervical DG Myelogram views: No results found for this or any previous visit. Cervical DG Myelogram views: No results found for this or any previous visit. Cervical Discogram views: No results found for this or any previous visit.  Shoulder Imaging: Shoulder-R MR w contrast: No results found for this or any previous visit. Shoulder-L MR w contrast: No results found for this or any previous visit. Shoulder-R MR w/wo contrast: No results found for this or any previous visit. Shoulder-L MR w/wo contrast: No results found for this or any previous visit. Shoulder-R MR wo contrast: No results found for this or any previous visit. Shoulder-L MR wo contrast: No results found for this or any previous visit. Shoulder-R CT w contrast: No results found for this or any previous visit. Shoulder-L CT w contrast: No results found for this or any previous visit. Shoulder-R CT  w/wo contrast: No results found for this or any previous visit. Shoulder-L CT w/wo contrast: No results found for this or any previous visit. Shoulder-R CT wo contrast: No results found for this or any previous visit. Shoulder-L CT wo contrast: No results found for this or any previous visit. Shoulder-R DG Arthrogram: No results found for this or any previous visit. Shoulder-L DG Arthrogram: No results found for this or any previous visit. Shoulder-R DG 1 view: No results found for this or any previous visit. Shoulder-L DG 1 view: No results found for this or any previous visit. Shoulder-R DG: No results found for this or any previous visit. Shoulder-L DG: No results found for this or any previous visit.  Thoracic Imaging: Thoracic MR wo contrast: No results found for this or any previous visit. Thoracic MR wo contrast: No procedure found. Thoracic MR w/wo contrast: No results found for this or any previous visit. Thoracic MR w contrast: No results found for this or any previous visit. Thoracic CT wo contrast: No results found for this or any previous visit. Thoracic CT w/wo contrast: No results found for this or any previous visit. Thoracic CT w/wo contrast: No results found for this or any previous visit. Thoracic CT w contrast: No results found for this or any previous visit. Thoracic DG 2-3 views: No results found for this  or any previous visit. Thoracic DG 4 views: No results found for this or any previous visit. Thoracic DG: No results found for this or any previous visit. Thoracic DG w/swimmers view: No results found for this or any previous visit. Thoracic DG Myelogram views: No results found for this or any previous visit. Thoracic DG Myelogram views: No results found for this or any previous visit.  Lumbosacral Imaging: Lumbar MR wo contrast: No results found for this or any previous visit. Lumbar MR wo contrast: No procedure found. Lumbar MR w/wo contrast: No results found  for this or any previous visit. Lumbar MR w contrast: No results found for this or any previous visit. Lumbar CT wo contrast:  Results for orders placed during the hospital encounter of 02/20/15  CT Lumbar Spine Wo Contrast   Narrative CLINICAL DATA:  Brain lab protocol. Preop spine to sacrum fusion. History of back pain with radicular symptoms in both legs.  EXAM: CT LUMBAR SPINE WITHOUT CONTRAST  TECHNIQUE: Multidetector CT imaging of the lumbar spine was performed without intravenous contrast administration. Multiplanar CT image reconstructions were also generated.  COMPARISON:  None.  FINDINGS: 1 mm contiguous images were obtained from L2 through the coccyx with patient prone in preparation for brain lab localization for surgical fusion. The patient has undergone previous L4-S1 posterior and interbody fusion. L4-5 fusion is solid. There is a pseudarthrosis at L5-S1. No solid interbody or posterior arthrodesis. BILATERAL S1 screw loosening. Hardware appropriately placed. No worrisome paravertebral findings. Unremarkable sacroiliac joints. Grossly unremarkable visualized abdominal soft tissues.  IMPRESSION: Brain lab protocol in preparation for fusion revision. Solid L4-5 fusion. L5-S1 pseudarthrosis.   Electronically Signed   By: Staci Righter M.D.   On: 02/20/2015 08:26    Lumbar CT w/wo contrast: No results found for this or any previous visit. Lumbar CT w/wo contrast: No results found for this or any previous visit. Lumbar CT w contrast: No results found for this or any previous visit. Lumbar DG 1V:  Results for orders placed during the hospital encounter of 05/29/13  DG Lumbar Spine 1 View   Narrative CLINICAL DATA:  L4-L5 and L5-S1 lumbar surgery  EXAM: LUMBAR SPINE - 1 VIEW  COMPARISON:  MRI lumbar spine 11/06/2008  FINDINGS: Prior MRI labeled with 5 lumbar vertebrae.  Cross-table lateral view #1 at 0806 hr interpreted at 3546 hr.  Metallic probe via  posterior approach projects dorsal to the L5-S1 disc space level.  Tissue spreaders and a surgical sponge are identified dorsal to L4 and L5.  IMPRESSION: Dorsal localization of the L5-S1 disc space level.   Electronically Signed   By: Lavonia Dana M.D.   On: 05/29/2013 11:57    Lumbar DG 1V (Clearing): No results found for this or any previous visit. Lumbar DG 2-3V (Clearing): No results found for this or any previous visit. Lumbar DG 2-3 views:  Results for orders placed during the hospital encounter of 04/19/15  DG Lumbar Spine 2-3 Views   Narrative CLINICAL DATA:  Persistent lumbago for 1 day.  Prior spinal fusion.  EXAM: LUMBAR SPINE - 2-3 VIEW  COMPARISON:  CT lumbar spine February 20, 2015  FINDINGS: Frontal, lateral, and spot lumbosacral lateral images were obtained. There is pedicle screw and plate fixation at L4, L5, and S1. There are also screws transfixing each sacroiliac joint. The screw and plate fixation devices appear intact as do disc spacers at L4-5 and L5-S1. The positioning of the pedicle screws appears unchanged compared to prior CT  examination.  There is no fracture or spondylolisthesis. Disc spaces appear unchanged ; the disc spaces superior to the postoperative areas appear unremarkable. No erosive change or bony destruction.  IMPRESSION: Postoperative change at multiple levels, stable. Note that there is now screw fixation to each sacroiliac joint. No fracture or spondylolisthesis. No erosive change or bony destruction apparent.   Electronically Signed   By: Lowella Grip III M.D.   On: 04/19/2015 14:51    Lumbar DG (Complete) 4+V: No results found for this or any previous visit. Lumbar DG F/E views: No results found for this or any previous visit. Lumbar DG Bending views: No results found for this or any previous visit. Lumbar DG Myelogram views: No results found for this or any previous visit. Lumbar DG Myelogram: No results found for  this or any previous visit. Lumbar DG Myelogram: No results found for this or any previous visit. Lumbar DG Myelogram: No results found for this or any previous visit. Lumbar DG Myelogram Lumbosacral: No results found for this or any previous visit. Lumbar DG Diskogram views: No results found for this or any previous visit. Lumbar DG Diskogram views: No results found for this or any previous visit. Lumbar DG Epidurogram OP: No results found for this or any previous visit. Lumbar DG Epidurogram IP: No results found for this or any previous visit.  Sacroiliac Joint Imaging: Sacroiliac Joint DG: No results found for this or any previous visit. Sacroiliac Joint MR w/wo contrast: No results found for this or any previous visit. Sacroiliac Joint MR wo contrast: No results found for this or any previous visit.  Spine Imaging: Whole Spine DG Myelogram views: No results found for this or any previous visit. Whole Spine MR Mets screen: No results found for this or any previous visit. Whole Spine MR Mets screen: No results found for this or any previous visit. Whole Spine MR w/wo: No results found for this or any previous visit. MRA Spinal Canal w/ cm: No results found for this or any previous visit. MRA Spinal Canal wo/ cm: No procedure found. MRA Spinal Canal w/wo cm: No results found for this or any previous visit. Spine Outside MR Films: No results found for this or any previous visit. Spine Outside CT Films: No results found for this or any previous visit. CT-Guided Biopsy: No results found for this or any previous visit. CT-Guided Needle Placement: No results found for this or any previous visit. DG Spine outside: No results found for this or any previous visit. IR Spine outside: No results found for this or any previous visit. NM Spine outside: No results found for this or any previous visit.  Hip Imaging: Hip-R MR w contrast: No results found for this or any previous visit. Hip-L MR w  contrast: No results found for this or any previous visit. Hip-R MR w/wo contrast: No results found for this or any previous visit. Hip-L MR w/wo contrast: No results found for this or any previous visit. Hip-R MR wo contrast: No results found for this or any previous visit. Hip-L MR wo contrast: No results found for this or any previous visit. Hip-R CT w contrast: No results found for this or any previous visit. Hip-L CT w contrast: No results found for this or any previous visit. Hip-R CT w/wo contrast: No results found for this or any previous visit. Hip-L CT w/wo contrast: No results found for this or any previous visit. Hip-R CT wo contrast: No results found for this or  any previous visit. Hip-L CT wo contrast: No results found for this or any previous visit. Hip-R DG 2-3 views: No results found for this or any previous visit. Hip-L DG 2-3 views: No results found for this or any previous visit. Hip-R DG Arthrogram: No results found for this or any previous visit. Hip-L DG Arthrogram: No results found for this or any previous visit. Hip-B DG Bilateral: No results found for this or any previous visit.  Knee Imaging: Knee-R MR w contrast: No results found for this or any previous visit. Knee-L MR w contrast: No results found for this or any previous visit. Knee-R MR w/wo contrast: No results found for this or any previous visit. Knee-L MR w/wo contrast: No results found for this or any previous visit. Knee-R MR wo contrast: No results found for this or any previous visit. Knee-L MR wo contrast: No results found for this or any previous visit. Knee-R CT w contrast: No results found for this or any previous visit. Knee-L CT w contrast: No results found for this or any previous visit. Knee-R CT w/wo contrast: No results found for this or any previous visit. Knee-L CT w/wo contrast: No results found for this or any previous visit. Knee-R CT wo contrast: No results found for this or any  previous visit. Knee-L CT wo contrast: No results found for this or any previous visit. Knee-R DG 1-2 views: No results found for this or any previous visit. Knee-L DG 1-2 views: No results found for this or any previous visit. Knee-R DG 3 views: No results found for this or any previous visit. Knee-L DG 3 views: No results found for this or any previous visit. Knee-R DG 4 views: No results found for this or any previous visit. Knee-L DG 4 views: No results found for this or any previous visit. Knee-R DG Arthrogram: No results found for this or any previous visit. Knee-L DG Arthrogram: No results found for this or any previous visit.  Note: Available results from prior imaging studies were reviewed.        ROS  Cardiovascular History: {Hx; Cardiovascular History:210120525} Pulmonary or Respiratory History: {Hx; Pumonary and/or Respiratory History:210120523} Neurological History: {Hx; Neurological:210120504} Review of Past Neurological Studies: No results found for this or any previous visit. Psychological-Psychiatric History: {Hx; Psychological-Psychiatric History:210120512} Gastrointestinal History: {Hx; Gastrointestinal:210120527} Genitourinary History: {Hx; Genitourinary:210120506} Hematological History: {Hx; Hematological:210120510} Endocrine History: {Hx; Endocrine history:210120509} Rheumatologic History: {Hx; Rheumatological:210120530} Musculoskeletal History: {Hx; Musculoskeletal:210120528} Work History: {Hx; Work history:210120514}  Allergies  Mr. Lowden is allergic to amoxicillin and vicodin [hydrocodone-acetaminophen].  Laboratory Chemistry  Inflammation Markers No results found for: CRP, ESRSEDRATE (CRP: Acute Phase) (ESR: Chronic Phase) Renal Function Markers Lab Results  Component Value Date   BUN 9 02/21/2015   CREATININE 1.01 02/21/2015   GFRAA >60 02/21/2015   GFRNONAA >60 02/21/2015   Hepatic Function Markers No results found for: AST, ALT, ALBUMIN,  ALKPHOS, HCVAB Electrolytes Lab Results  Component Value Date   NA 143 02/21/2015   K 3.9 02/21/2015   CL 108 02/21/2015   CALCIUM 9.4 02/21/2015   Neuropathy Markers No results found for: [id number] Bone Pathology Markers Lab Results  Component Value Date   CALCIUM 9.4 02/21/2015   Coagulation Parameters Lab Results  Component Value Date   PLT 248 02/21/2015   Cardiovascular Markers Lab Results  Component Value Date   HGB 13.1 02/21/2015   HCT 39.9 02/21/2015   Note: Lab results reviewed.  [place]  Drug: Mr. Keenum  reports that  he does not use drugs. Alcohol:  reports that he drinks alcohol. Tobacco:  reports that he has been smoking.  He has a 16.00 pack-year smoking history. He has never used smokeless tobacco. Medical:  has a past medical history of Anxiety, Arthritis, and GERD (gastroesophageal reflux disease). Family: family history is not on file.  Past Surgical History:  Procedure Laterality Date  . BACK SURGERY     LUMBAR LAMINECTOMY  . HAND REIMPLANTED     LEFT HAND 1985  . LUMBAR FUSION  11/14   Active Ambulatory Problems    Diagnosis Date Noted  . Lumbar pseudoarthrosis 02/20/2015  . Chronic back pain 01/13/2018  . Tobacco use 01/13/2018   Resolved Ambulatory Problems    Diagnosis Date Noted  . No Resolved Ambulatory Problems   Past Medical History:  Diagnosis Date  . Anxiety   . Arthritis   . GERD (gastroesophageal reflux disease)    Constitutional Exam  General appearance: Well nourished, well developed, and well hydrated. In no apparent acute distress There were no vitals filed for this visit. BMI Assessment: Estimated body mass index is 25.11 kg/m as calculated from the following:   Height as of 09/02/16: '5\' 10"'  (1.778 m).   Weight as of 09/02/16: 175 lb (79.4 kg).  BMI interpretation table: BMI level Category Range association with higher incidence of chronic pain  <18 kg/m2 Underweight   18.5-24.9 kg/m2 Ideal body weight   25-29.9  kg/m2 Overweight Increased incidence by 20%  30-34.9 kg/m2 Obese (Class I) Increased incidence by 68%  35-39.9 kg/m2 Severe obesity (Class II) Increased incidence by 136%  >40 kg/m2 Extreme obesity (Class III) Increased incidence by 254%   BMI Readings from Last 4 Encounters:  09/02/16 25.11 kg/m  02/20/15 23.39 kg/m  02/12/15 23.50 kg/m  05/29/13 22.91 kg/m   Wt Readings from Last 4 Encounters:  09/02/16 175 lb (79.4 kg)  02/20/15 163 lb (73.9 kg)  02/12/15 163 lb 12.8 oz (74.3 kg)  05/29/13 160 lb 0.9 oz (72.6 kg)  Psych/Mental status: Alert, oriented x 3 (person, place, & time)       Eyes: PERLA Respiratory: No evidence of acute respiratory distress  Cervical Spine Exam  Inspection: No masses, redness, or swelling Alignment: Symmetrical Functional ROM: Unrestricted ROM      Stability: No instability detected Muscle strength & Tone: Functionally intact Sensory: Unimpaired Palpation: No palpable anomalies              Upper Extremity (UE) Exam    Side: Right upper extremity  Side: Left upper extremity  Inspection: No masses, redness, swelling, or asymmetry. No contractures  Inspection: No masses, redness, swelling, or asymmetry. No contractures  Functional ROM: Unrestricted ROM          Functional ROM: Unrestricted ROM          Muscle strength & Tone: Functionally intact  Muscle strength & Tone: Functionally intact  Sensory: Unimpaired  Sensory: Unimpaired  Palpation: No palpable anomalies              Palpation: No palpable anomalies              Specialized Test(s): Deferred         Specialized Test(s): Deferred          Thoracic Spine Exam  Inspection: No masses, redness, or swelling Alignment: Symmetrical Functional ROM: Unrestricted ROM Stability: No instability detected Sensory: Unimpaired Muscle strength & Tone: No palpable anomalies  Lumbar Spine Exam  Inspection: No  masses, redness, or swelling Alignment: Symmetrical Functional ROM: Unrestricted ROM       Stability: No instability detected Muscle strength & Tone: Functionally intact Sensory: Unimpaired Palpation: No palpable anomalies       Provocative Tests: Lumbar Hyperextension and rotation test: evaluation deferred today       Patrick's Maneuver: evaluation deferred today                    Gait & Posture Assessment  Ambulation: Unassisted Gait: Relatively normal for age and body habitus Posture: WNL   Lower Extremity Exam    Side: Right lower extremity  Side: Left lower extremity  Inspection: No masses, redness, swelling, or asymmetry. No contractures  Inspection: No masses, redness, swelling, or asymmetry. No contractures  Functional ROM: Unrestricted ROM          Functional ROM: Unrestricted ROM          Muscle strength & Tone: Functionally intact  Muscle strength & Tone: Functionally intact  Sensory: Unimpaired  Sensory: Unimpaired  Palpation: No palpable anomalies  Palpation: No palpable anomalies   Assessment  Primary Diagnosis & Pertinent Problem List: There were no encounter diagnoses.  Visit Diagnosis: No diagnosis found. Plan of Care  Initial treatment plan:  Please be advised that as per protocol, today's visit has been an evaluation only. We have not taken over the patient's controlled substance management.  Problem-specific plan: No problem-specific Assessment & Plan notes found for this encounter.  Ordered Lab-work, Procedure(s), Referral(s), & Consult(s): No orders of the defined types were placed in this encounter.  Pharmacotherapy: Medications ordered:  No orders of the defined types were placed in this encounter.  Medications administered during this visit: [name] had no medications administered during this visit.   Pharmacotherapy under consideration:  Opioid Analgesics: The patient was informed that there is no guarantee that he would be a candidate for opioid analgesics. The decision will be made following CDC guidelines. This decision  will be based on the results of diagnostic studies, as well as Mr. Golob risk profile.  Membrane stabilizer: To be determined at a later time Muscle relaxant: To be determined at a later time NSAID: To be determined at a later time Other analgesic(s): To be determined at a later time   Interventional therapies under consideration: Mr. Wicke was informed that there is no guarantee that he would be a candidate for interventional therapies. The decision will be based on the results of diagnostic studies, as well as Mr. Schear risk profile.  Possible procedure(s): ***   Provider-requested follow-up: No follow-ups on file.  Future Appointments  Date Time Provider [place]  01/13/2018  1:00 PM Vevelyn Francois, NP [facility] None    Primary Care Physician: Derek Marble, MD Location: [facility] Note by:  Date: 01/13/2018; Time: 12:04 PM  Pain Score Disclaimer: We use the NRS-11 scale. This is a self-reported, subjective measurement of pain severity with only modest accuracy. It is used primarily to identify changes within a particular patient. It must be understood that outpatient pain scales are significantly less accurate that those used for research, where they can be applied under ideal controlled circumstances with minimal exposure to variables. In reality, the score is likely to be a combination of pain intensity and pain affect, where pain affect describes the degree of emotional arousal or changes in action readiness caused by the sensory experience of pain. Factors such as social and work situation, setting,  emotional state, anxiety levels, expectation, and prior pain experience may influence pain perception and show large inter-individual differences that may also be affected by time variables.  Patient instructions provided during this appointment: There are no Patient Instructions on file for this visit.

## 2018-01-17 ENCOUNTER — Ambulatory Visit: Payer: BLUE CROSS/BLUE SHIELD | Admitting: Nurse Practitioner

## 2018-01-24 ENCOUNTER — Other Ambulatory Visit: Payer: Self-pay

## 2018-01-24 ENCOUNTER — Encounter: Payer: Self-pay | Admitting: Nurse Practitioner

## 2018-01-24 ENCOUNTER — Ambulatory Visit: Payer: BLUE CROSS/BLUE SHIELD | Attending: Nurse Practitioner | Admitting: Nurse Practitioner

## 2018-01-24 VITALS — BP 123/73 | HR 103 | Temp 98.2°F | Ht 70.0 in | Wt 184.0 lb

## 2018-01-24 DIAGNOSIS — Z79891 Long term (current) use of opiate analgesic: Secondary | ICD-10-CM | POA: Diagnosis not present

## 2018-01-24 DIAGNOSIS — G8929 Other chronic pain: Secondary | ICD-10-CM

## 2018-01-24 DIAGNOSIS — G894 Chronic pain syndrome: Secondary | ICD-10-CM | POA: Diagnosis not present

## 2018-01-24 DIAGNOSIS — Z981 Arthrodesis status: Secondary | ICD-10-CM | POA: Insufficient documentation

## 2018-01-24 DIAGNOSIS — M79604 Pain in right leg: Secondary | ICD-10-CM | POA: Insufficient documentation

## 2018-01-24 DIAGNOSIS — M7989 Other specified soft tissue disorders: Secondary | ICD-10-CM | POA: Insufficient documentation

## 2018-01-24 DIAGNOSIS — M5442 Lumbago with sciatica, left side: Secondary | ICD-10-CM | POA: Insufficient documentation

## 2018-01-24 DIAGNOSIS — R531 Weakness: Secondary | ICD-10-CM | POA: Diagnosis not present

## 2018-01-24 DIAGNOSIS — K5900 Constipation, unspecified: Secondary | ICD-10-CM | POA: Insufficient documentation

## 2018-01-24 DIAGNOSIS — R200 Anesthesia of skin: Secondary | ICD-10-CM | POA: Diagnosis not present

## 2018-01-24 DIAGNOSIS — M79605 Pain in left leg: Secondary | ICD-10-CM | POA: Insufficient documentation

## 2018-01-24 DIAGNOSIS — M545 Low back pain: Secondary | ICD-10-CM | POA: Diagnosis present

## 2018-01-24 DIAGNOSIS — R252 Cramp and spasm: Secondary | ICD-10-CM | POA: Diagnosis not present

## 2018-01-24 DIAGNOSIS — Z789 Other specified health status: Secondary | ICD-10-CM

## 2018-01-24 DIAGNOSIS — S32009A Unspecified fracture of unspecified lumbar vertebra, initial encounter for closed fracture: Secondary | ICD-10-CM | POA: Insufficient documentation

## 2018-01-24 DIAGNOSIS — M5441 Lumbago with sciatica, right side: Secondary | ICD-10-CM | POA: Insufficient documentation

## 2018-01-24 DIAGNOSIS — M899 Disorder of bone, unspecified: Secondary | ICD-10-CM | POA: Insufficient documentation

## 2018-01-24 DIAGNOSIS — Z79899 Other long term (current) drug therapy: Secondary | ICD-10-CM | POA: Insufficient documentation

## 2018-01-24 NOTE — Progress Notes (Signed)
Patient's Name: Derek [name]  MRN: [id number]  Referring Provider: Jodi Marble, MD  DOB: [birth year]  PCP: Jodi Marble, MD  DOS: 01/24/2018  Note by: Dionisio David NP  Service setting: Ambulatory outpatient  Specialty: Interventional Pain Management  Location: [facility] (AMB) Pain Management Facility    Patient type: New Patient    Primary Reason(s) for Visit: Initial Patient Evaluation CC: Back Pain (lower)  HPI  [name] is a 37 y.o. year old, male patient, who comes today for an initial evaluation. He has Lumbar pseudoarthrosis; Chronic back pain; Tobacco use; Chronic bilateral low back pain with bilateral sciatica (Primary Area of Pain) (L>R); Chronic pain of both lower extremities (Secondary Area of Pain) (L>R); Chronic pain syndrome; Long term current use of opiate analgesic; Pharmacologic therapy; Disorder of skeletal system; and Problems influencing health status on their problem list.. His primarily concern today is the Back Pain (lower)  Pain Assessment: Location: Lower Back Radiating: Pain radiates down both leg to ankle Onset: More than a month ago Duration: Chronic pain Quality: Aching, Throbbing, Pounding, Sharp, Constant Severity: 3 /10 (subjective, self-reported pain score)  Note: Reported level is compatible with observation.                          Effect on ADL: Limits my activities Timing: Constant Modifying factors: hot shower, heating pad, ice pad, medications, physcial therapy, massages, TENs unit, liocaine patches BP: 123/73  HR: (!) 103  Onset and Duration: Sudden, Date of onset: 10/06/2006 and Present longer than 3 months Cause of pain: falling accident in driveway Severity: Getting worse, NAS-11 at its worse: 9/10, NAS-11 at its best: 2/10, NAS-11 now: 4/10 and NAS-11 on the average: 6/10 Timing: Morning, Afternoon, Night, During activity or exercise, After activity or exercise and After a period of immobility Aggravating Factors: Bending, Climbing,  Lifiting, Prolonged sitting, Prolonged standing, Squatting, Twisting, Walking, Walking uphill, Walking downhill and Working Alleviating Factors: Cold packs, Hot packs, Lying down, Medications, Nerve blocks, Resting, Sitting, Standing, TENS, Using a brace, Warm showers or baths, Chiropractic manipulations and Walking Associated Problems: Constipation, Night-time cramps, Fatigue, Inability to concentrate, Numbness, Sadness, Spasms, Sweating, Swelling, Tingling, Weakness, Pain that wakes patient up and Pain that does not allow patient to sleep Quality of Pain: Aching, Agonizing, Burning, Constant, Cramping, Cruel, Deep, Disabling, Dreadful, Dull, Exhausting, Horrible, Nagging, Pressure-like, Pulsating, Punishing, Sharp, Shooting, Sickening, Stabbing, Tender, Throbbing, Tingling, Tiring, Toothache-like and Uncomfortable Previous Examinations or Tests: CT scan, Endoscopy, MRI scan, Nerve block, X-rays, Nerve conduction test, Neurosurgical evaluation and Chiropractic evaluation Previous Treatments: Chiropractic manipulations, Epidural steroid injections, Facet blocks, Narcotic medications, Physical Therapy, Radiofrequency, Steroid treatments by mouth, Strengthening exercises, Stretching exercises, TENS and Trigger point injections  The patient comes into the clinics today for the first time for a chronic pain management evaluation.  According to the patient his primary area of pain is in his lower back.  He admits that this is related to an fall that he suffered in his driveway approximately 11 years ago.  He is status post lumbar spinal surgery x3.  He admits his first surgery was in April 2008 Dr. Sherwood Gambler.  He has had 2 additional surgeries by Dr. Arnoldo Morale last being 05/29/2016 spinal fusion.  He admits that he has been treated with interventional therapy in the past by Dr. Primus Bravo and Dr. Assunta Curtis.  He does not feel like the epidural steroids were effective.  He admits that the first injection was effective  for about  4 to 5 days.  His last physical therapy was 8 weeks in 2016.  Denies any recent physical therapy.  He admits that he does see Dr. Laveda Abbe chiropractor monthly.  He admits that he had images of few weeks ago [place] neuro[facility].  His second area pain is in his legs.  He admits the left is greater than the right.  He admits that the pain goes down the back of his leg into his heels.  He describes it as numbness with prolonged sitting or standing.  He also has tingling.  He admits that he has swelling and cramping in his legs in the mornings.  He admits that he did have a nerve conduction study in the past while being treated by Dr. Primus Bravo.  He admits that he does have muscle spasms in his upper back he admits only on occasion.  He admits that the Flexeril, tizanidine and Percocet are not effective when this happens.  Today I took the time to provide the patient with information regarding this pain practice. The patient was informed that the practice is divided into two sections: an interventional pain management section, as well as a completely separate and distinct medication management section. I explained that there are procedure days for interventional therapies, and evaluation days for follow-ups and medication management. Because of the amount of documentation required during both, they are kept separated. This means that there is the possibility that he may be scheduled for a procedure on one day, and medication management the next. I have also informed him that because of staffing and facility limitations, this practice will no longer take patients for medication management only. To illustrate the reasons for this, I gave the patient the example of surgeons, and how inappropriate it would be to refer a patient to his/her care, just to write for the post-surgical antibiotics on a surgery done by a different surgeon.   Because interventional pain management is part of the board-certified specialty for the  doctors, the patient was informed that joining this practice means that they are open to any and all interventional therapies. I made it clear that this does not mean that they will be forced to have any procedures done. What this means is that I believe interventional therapies to be essential part of the diagnosis and proper management of chronic pain conditions. Therefore, patients not interested in these interventional alternatives will be better served under the care of a different practitioner.  The patient was also made aware of my Comprehensive Pain Management Safety Guidelines where by joining this practice, they limit all of their nerve blocks and joint injections to those done by our practice, for as long as we are retained to manage their care. Historic Controlled Substance Pharmacotherapy Review  PMP and historical list of controlled substances: Oxycodone/acetaminophen 10/325 mg, clonazepam 0.5 mg, lorazepam 0.5 mg, diazepam 5 mg, oxycodone 15 mg hydrocodone/acetaminophen 10/325 mg, hydrocodone/acetaminophen 7.5/325 mg, hydrocodone/acetaminophen 5/325 mg, hydrocodone/acetaminophen 10/500 mg, Highest opioid analgesic regimen found: Oxycodone 15 mg 1 tablet 5 times daily (fill date 07/10/2013) oxycodone 75 mg/day Most recent opioid analgesic: Oxycodone/acetaminophen 10/325 mg (fill date 01/07/2018) oxycodone 40 mg/day Current opioid analgesics: Oxycodone/acetaminophen 10/325 mg (fill date 01/07/2018) oxycodone 40 mg/day Highest recorded MME/day: 112.5 mg/day MME/day: 60 mg/day Medications: The patient did not bring the medication(s) to the appointment, as requested in our "New Patient Package" Pharmacodynamics: Desired effects: Analgesia: The patient reports >50% benefit. Reported improvement in function: The patient reports medication allows him  to accomplish basic ADLs. Clinically meaningful improvement in function (CMIF): Sustained CMIF goals met Perceived effectiveness: Described as  relatively effective, allowing for increase in activities of daily living (ADL) Undesirable effects: Side-effects or Adverse reactions: None reported Historical Monitoring: The patient  reports that he does not use drugs. List of all UDS Test(s): No results found for: MDMA, COCAINSCRNUR, PCPSCRNUR, PCPQUANT, CANNABQUANT, THCU, [place] List of all Serum Drug Screening Test(s):  No results found for: AMPHSCRSER, BARBSCRSER, BENZOSCRSER, COCAINSCRSER, PCPSCRSER, PCPQUANT, THCSCRSER, CANNABQUANT, OPIATESCRSER, OXYSCRSER, PROPOXSCRSER Historical Background Evaluation: [place] PDMP: Six (6) year initial data search conducted.             [place] Department of public safety, offender search: Editor, commissioning Information) Non-contributory Risk Assessment Profile: Aberrant behavior: None observed or detected today Risk factors for fatal opioid overdose: Benzodiazepine use and male gender Fatal overdose hazard ratio (HR): Calculation deferred Non-fatal overdose hazard ratio (HR): Calculation deferred Risk of opioid abuse or dependence: 0.7-3.0% with doses ? 36 MME/day and 6.1-26% with doses ? 120 MME/day. Substance use disorder (SUD) risk level: Pending results of Medical Psychology Evaluation for SUD Opioid risk tool (ORT) (Total Score): 1  ORT Scoring interpretation table:  Score <3 = Low Risk for SUD  Score between 4-7 = Moderate Risk for SUD  Score >8 = High Risk for Opioid Abuse   PHQ-2 Depression Scale:  Total score: 0  PHQ-2 Scoring interpretation table: (Score and probability of major depressive disorder)  Score 0 = No depression  Score 1 = 15.4% Probability  Score 2 = 21.1% Probability  Score 3 = 38.4% Probability  Score 4 = 45.5% Probability  Score 5 = 56.4% Probability  Score 6 = 78.6% Probability   PHQ-9 Depression Scale:  Total score: 0  PHQ-9 Scoring interpretation table:  Score 0-4 = No depression  Score 5-9 = Mild depression  Score 10-14 = Moderate depression  Score 15-19 = Moderately  severe depression  Score 20-27 = Severe depression (2.4 times higher risk of SUD and 2.89 times higher risk of overuse)   Pharmacologic Plan: Pending ordered tests and/or consults  Meds  The patient has a current medication list which includes the following prescription(s): cyclobenzaprine, oxycodone-acetaminophen, oxycodone-acetaminophen, and tizanidine.  Current Outpatient Medications on File Prior to Visit  Medication Sig  . cyclobenzaprine (FLEXERIL) 10 MG tablet Take 1 tablet (10 mg total) by mouth 3 (three) times daily as needed for muscle spasms.  Marland Kitchen oxyCODONE-acetaminophen (PERCOCET) 10-325 MG per tablet Take 1 tablet by oral route every 6 hours as needed for pain  . oxyCODONE-acetaminophen (PERCOCET) 10-325 MG per tablet Take 1 tablet by mouth every 4 (four) hours as needed for pain.  Marland Kitchen tiZANidine (ZANAFLEX) 4 MG tablet Take 4 mg by mouth every 8 (eight) hours as needed for muscle spasms.   No current facility-administered medications on file prior to visit.    Imaging Review  Lumbosacral Imaging:  Lumbar CT wo contrast:  Results for orders placed during the hospital encounter of 02/20/15  CT Lumbar Spine Wo Contrast   Narrative CLINICAL DATA:  Brain lab protocol. Preop spine to sacrum fusion. History of back pain with radicular symptoms in both legs.  EXAM: CT LUMBAR SPINE WITHOUT CONTRAST  TECHNIQUE: Multidetector CT imaging of the lumbar spine was performed without intravenous contrast administration. Multiplanar CT image reconstructions were also generated.  COMPARISON:  None.  FINDINGS: 1 mm contiguous images were obtained from L2 through the coccyx with patient prone in preparation for brain lab localization for  surgical fusion. The patient has undergone previous L4-S1 posterior and interbody fusion. L4-5 fusion is solid. There is a pseudarthrosis at L5-S1. No solid interbody or posterior arthrodesis. BILATERAL S1 screw loosening. Hardware appropriately placed.  No worrisome paravertebral findings. Unremarkable sacroiliac joints. Grossly unremarkable visualized abdominal soft tissues.  IMPRESSION: Brain lab protocol in preparation for fusion revision. Solid L4-5 fusion. L5-S1 pseudarthrosis.   Electronically Signed   By: Staci Righter M.D.   On: 02/20/2015 08:26   Lumbar DG 1V:  Results for orders placed during the hospital encounter of 05/29/13  DG Lumbar Spine 1 View   Narrative CLINICAL DATA:  L4-L5 and L5-S1 lumbar surgery  EXAM: LUMBAR SPINE - 1 VIEW  COMPARISON:  MRI lumbar spine 11/06/2008  FINDINGS: Prior MRI labeled with 5 lumbar vertebrae.  Cross-table lateral view #1 at 0806 hr interpreted at 2353 hr.  Metallic probe via posterior approach projects dorsal to the L5-S1 disc space level.  Tissue spreaders and a surgical sponge are identified dorsal to L4 and L5.  IMPRESSION: Dorsal localization of the L5-S1 disc space level.   Electronically Signed   By: Lavonia Dana M.D.   On: 05/29/2013 11:57     Lumbar DG 2-3 views:  Results for orders placed during the hospital encounter of 04/19/15  DG Lumbar Spine 2-3 Views   Narrative CLINICAL DATA:  Persistent lumbago for 1 day.  Prior spinal fusion.  EXAM: LUMBAR SPINE - 2-3 VIEW  COMPARISON:  CT lumbar spine February 20, 2015  FINDINGS: Frontal, lateral, and spot lumbosacral lateral images were obtained. There is pedicle screw and plate fixation at L4, L5, and S1. There are also screws transfixing each sacroiliac joint. The screw and plate fixation devices appear intact as do disc spacers at L4-5 and L5-S1. The positioning of the pedicle screws appears unchanged compared to prior CT examination.  There is no fracture or spondylolisthesis. Disc spaces appear unchanged ; the disc spaces superior to the postoperative areas appear unremarkable. No erosive change or bony destruction.  IMPRESSION: Postoperative change at multiple levels, stable. Note that there  is now screw fixation to each sacroiliac joint. No fracture or spondylolisthesis. No erosive change or bony destruction apparent.   Electronically Signed   By: Lowella Grip III M.D.   On: 04/19/2015 14:51   Note: Available results from prior imaging studies were reviewed.        ROS  Cardiovascular History: Abnormal heart rhythm Pulmonary or Respiratory History: Smoking and Coughing up mucus (Bronchitis) Neurological History: No reported neurological signs or symptoms such as seizures, abnormal skin sensations, urinary and/or fecal incontinence, being born with an abnormal open spine and/or a tethered spinal cord Review of Past Neurological Studies: No results found for this or any previous visit. Psychological-Psychiatric History: Anxiousness and Prone to panicking Gastrointestinal History: Vomiting blood (Ulcers), Reflux or heatburn and Irregular, infrequent bowel movements (Constipation) Genitourinary History: No reported renal or genitourinary signs or symptoms such as difficulty voiding or producing urine, peeing blood, non-functioning kidney, kidney stones, difficulty emptying the bladder, difficulty controlling the flow of urine, or chronic kidney disease Hematological History: No reported hematological signs or symptoms such as prolonged bleeding, low or poor functioning platelets, bruising or bleeding easily, hereditary bleeding problems, low energy levels due to low hemoglobin or being anemic Endocrine History: No reported endocrine signs or symptoms such as high or low blood sugar, rapid heart rate due to high thyroid levels, obesity or weight gain due to slow thyroid or thyroid disease Rheumatologic  History: No reported rheumatological signs and symptoms such as fatigue, joint pain, tenderness, swelling, redness, heat, stiffness, decreased range of motion, with or without associated rash Musculoskeletal History: Negative for myasthenia gravis, muscular dystrophy, multiple  sclerosis or malignant hyperthermia Work History: Working full time  Allergies  Mr. Harshfield is allergic to amoxicillin and vicodin [hydrocodone-acetaminophen].  Laboratory Chemistry  Inflammation Markers No results found for: CRP, ESRSEDRATE (CRP: Acute Phase) (ESR: Chronic Phase) Renal Function Markers Lab Results  Component Value Date   BUN 9 02/21/2015   CREATININE 1.01 02/21/2015   GFRAA >60 02/21/2015   GFRNONAA >60 02/21/2015   Hepatic Function Markers No results found for: AST, ALT, ALBUMIN, ALKPHOS, HCVAB Electrolytes Lab Results  Component Value Date   NA 143 02/21/2015   K 3.9 02/21/2015   CL 108 02/21/2015   CALCIUM 9.4 02/21/2015   Neuropathy Markers No results found for: [id number] Bone Pathology Markers Lab Results  Component Value Date   CALCIUM 9.4 02/21/2015   Coagulation Parameters Lab Results  Component Value Date   PLT 248 02/21/2015   Cardiovascular Markers Lab Results  Component Value Date   HGB 13.1 02/21/2015   HCT 39.9 02/21/2015   Note: Lab results reviewed.  [place]  Drug: Mr. Calcaterra  reports that he does not use drugs. Alcohol:  reports that he drinks alcohol. Tobacco:  reports that he has been smoking.  He has a 16.00 pack-year smoking history. He has never used smokeless tobacco. Medical:  has a past medical history of Anxiety, Arthritis, and GERD (gastroesophageal reflux disease). Family: family history is not on file.  Past Surgical History:  Procedure Laterality Date  . BACK SURGERY     LUMBAR LAMINECTOMY  . HAND REIMPLANTED     LEFT HAND 1985  . LUMBAR FUSION  11/14   Active Ambulatory Problems    Diagnosis Date Noted  . Lumbar pseudoarthrosis 02/20/2015  . Chronic back pain 01/13/2018  . Tobacco use 01/13/2018  . Chronic bilateral low back pain with bilateral sciatica (Primary Area of Pain) (L>R) 01/24/2018  . Chronic pain of both lower extremities (Secondary Area of Pain) (L>R) 01/24/2018  . Chronic pain syndrome  01/24/2018  . Long term current use of opiate analgesic 01/24/2018  . Pharmacologic therapy 01/24/2018  . Disorder of skeletal system 01/24/2018  . Problems influencing health status 01/24/2018   Resolved Ambulatory Problems    Diagnosis Date Noted  . No Resolved Ambulatory Problems   Past Medical History:  Diagnosis Date  . Anxiety   . Arthritis   . GERD (gastroesophageal reflux disease)    Constitutional Exam  General appearance: Well nourished, well developed, and well hydrated. In no apparent acute distress Vitals:   01/24/18 0810  BP: 123/73  Pulse: (!) 103  Temp: 98.2 F (36.8 C)  SpO2: 96%  Weight: 184 lb (83.5 kg)  Height: _0  (1.778 m)   BMI Assessment: Estimated body mass index is 26.4 kg/m as calculated from the following:   Height as of this encounter: _1  (1.778 m).   Weight as of this encounter: 184 lb (83.5 kg).  BMI interpretation table: BMI level Category Range association with higher incidence of chronic pain  <18 kg/m2 Underweight   18.5-24.9 kg/m2 Ideal body weight   25-29.9 kg/m2 Overweight Increased incidence by 20%  30-34.9 kg/m2 Obese (Class I) Increased incidence by 68%  35-39.9 kg/m2 Severe obesity (Class II) Increased incidence by 136%  >40 kg/m2 Extreme obesity (Class III) Increased incidence by 254%  BMI Readings from Last 4 Encounters:  01/24/18 26.40 kg/m  09/02/16 25.11 kg/m  02/20/15 23.39 kg/m  02/12/15 23.50 kg/m   Wt Readings from Last 4 Encounters:  01/24/18 184 lb (83.5 kg)  09/02/16 175 lb (79.4 kg)  02/20/15 163 lb (73.9 kg)  02/12/15 163 lb 12.8 oz (74.3 kg)  Psych/Mental status: Alert, oriented x 3 (person, place, & time)       Eyes: PERLA Respiratory: No evidence of acute respiratory distress  Cervical Spine Exam  Inspection: No masses, redness, or swelling Alignment: Symmetrical Functional ROM: Unrestricted ROM      Stability: No instability detected Muscle strength & Tone: Functionally  intact Sensory: Unimpaired Palpation: No palpable anomalies              Upper Extremity (UE) Exam    Side: Right upper extremity  Side: Left upper extremity  Inspection: No masses, redness, swelling, or asymmetry. No contractures  Inspection: No masses, redness, swelling, or asymmetry. No contractures  Functional ROM: Unrestricted ROM          Functional ROM: Unrestricted ROM          Muscle strength & Tone: Functionally intact  Muscle strength & Tone: Functionally intact  Sensory: Unimpaired  Sensory: Unimpaired  Palpation: No palpable anomalies              Palpation: No palpable anomalies              Specialized Test(s): Deferred         Specialized Test(s): Deferred          Thoracic Spine Exam  Inspection: No masses, redness, or swelling Alignment: Symmetrical Functional ROM: Unrestricted ROM Stability: No instability detected Sensory: Unimpaired Muscle strength & Tone: No palpable anomalies  Lumbar Spine Exam  Inspection: Well healed scar from previous spine surgery detected Alignment: Symmetrical Functional ROM: Unrestricted ROM      Stability: No instability detected Muscle strength & Tone: Functionally intact Sensory: Unimpaired Palpation: Complains of area being tender to palpation       Provocative Tests: Lumbar Hyperextension and rotation test: Positive bilaterally for facet joint pain. Patrick's Maneuver: Negative                    Gait & Posture Assessment  Ambulation: Unassisted Gait: Relatively normal for age and body habitus Posture: WNL   Lower Extremity Exam    Side: Right lower extremity  Side: Left lower extremity  Inspection: No masses, redness, swelling, or asymmetry. No contractures  Inspection: No masses, redness, swelling, or asymmetry. No contractures  Functional ROM: Unrestricted ROM          Functional ROM: Unrestricted ROM          Muscle strength & Tone: Functionally intact  Muscle strength & Tone: Functionally intact  Sensory: Unimpaired   Sensory: Unimpaired  Palpation: No palpable anomalies  Palpation: No palpable anomalies   Assessment  Primary Diagnosis & Pertinent Problem List: The primary encounter diagnosis was Chronic bilateral low back pain with bilateral sciatica (Primary Area of Pain) (L>R). Diagnoses of Chronic pain of both lower extremities (Secondary Area of Pain) (L>R), Chronic pain syndrome, Long term current use of opiate analgesic, Pharmacologic therapy, Disorder of skeletal system, and Problems influencing health status were also pertinent to this visit.  Visit Diagnosis: 1. Chronic bilateral low back pain with bilateral sciatica (Primary Area of Pain) (L>R)   2. Chronic pain of both lower extremities (Secondary Area of Pain) (L>R)  3. Chronic pain syndrome   4. Long term current use of opiate analgesic   5. Pharmacologic therapy   6. Disorder of skeletal system   7. Problems influencing health status    Plan of Care  Initial treatment plan:  Please be advised that as per protocol, today's visit has been an evaluation only. We have not taken over the patient's controlled substance management.  Problem-specific plan: No problem-specific Assessment & Plan notes found for this encounter.  Ordered Lab-work, Procedure(s), Referral(s), & Consult(s): Orders Placed This Encounter  Procedures  . DG Lumbar Spine Complete W/Bend  . Compliance Drug Analysis, Ur  . Comp. Metabolic Panel (12)  . Magnesium  . Vitamin B12  . Sedimentation rate  . 25-Hydroxyvitamin D Lcms D2+D3  . C-reactive protein   Pharmacotherapy: Medications ordered:  No orders of the defined types were placed in this encounter.  Medications administered during this visit: [name] had no medications administered during this visit.   Pharmacotherapy under consideration:  Opioid Analgesics: The patient was informed that there is no guarantee that he would be a candidate for opioid analgesics. The decision will be made following  CDC guidelines. This decision will be based on the results of diagnostic studies, as well as Mr. Chapel risk profile.  Membrane stabilizer: To be determined at a later time Muscle relaxant: To be determined at a later time NSAID: To be determined at a later time Other analgesic(s): To be determined at a later time   Interventional therapies under consideration: Mr. Mcmeen was informed that there is no guarantee that he would be a candidate for interventional therapies. The decision will be based on the results of diagnostic studies, as well as Mr. Mey risk profile.  Possible procedure(s): Diagnostic bilateral lumbar epidural steroid injection Diagnostic bilateral lumbar facet nerve block Possible bilateral lumbar facet radiofrequency ablation Diagnostic trigger point injections   Provider-requested follow-up: Return for 2nd Visit, w/ Dr. Dossie Arbour, [place] before return for second visit.  Future Appointments  Date Time Provider [place]  02/14/2018  8:30 AM Milinda Pointer, MD [facility] None    Primary Care Physician: Jodi Marble, MD Location: [facility] Outpatient Pain Management Facility Note by:  Date: 01/24/2018; Time: 11:17 AM  Pain Score Disclaimer: We use the NRS-11 scale. This is a self-reported, subjective measurement of pain severity with only modest accuracy. It is used primarily to identify changes within a particular patient. It must be understood that outpatient pain scales are significantly less accurate that those used for research, where they can be applied under ideal controlled circumstances with minimal exposure to variables. In reality, the score is likely to be a combination of pain intensity and pain affect, where pain affect describes the degree of emotional arousal or changes in action readiness caused by the sensory experience of pain. Factors such as social and work situation, setting, emotional state, anxiety levels, expectation, and prior pain  experience may influence pain perception and show large inter-individual differences that may also be affected by time variables.  Patient instructions provided during this appointment: Patient Instructions   ____________________________________________________________________________________________  Appointment Policy Summary  It is our goal and responsibility to provide the medical community with assistance in the evaluation and management of patients with chronic pain. Unfortunately our resources are limited. Because we do not have an unlimited amount of time, or available appointments, we are required to closely monitor and manage their use. The following rules exist to maximize their use:  Patient's responsibilities: 1. Punctuality:  At what time should I arrive? You should be physically present in our office 30 minutes before your scheduled appointment. Your scheduled appointment is with your assigned healthcare provider. However, it takes 5-10 minutes to be "checked-in", and another 15 minutes for the nurses to do the admission. If you arrive to our office at the time you were given for your appointment, you will end up being at least 20-25 minutes late to your appointment with the provider. 2. Tardiness:  What happens if I arrive only a few minutes after my scheduled appointment time? You will need to reschedule your appointment. The cutoff is your appointment time. This is why it is so important that you arrive at least 30 minutes before that appointment. If you have an appointment scheduled for 10:00 AM and you arrive at 10:01, you will be required to reschedule your appointment.  3. Plan ahead:  Always assume that you will encounter traffic on your way in. Plan for it. If you are dependent on a driver, make sure they understand these rules and the need to arrive early. 4. Other appointments and responsibilities:  Avoid scheduling any other appointments before or after your pain clinic  appointments.  5. Be prepared:  Write down everything that you need to discuss with your healthcare provider and give this information to the admitting nurse. Write down the medications that you will need refilled. Bring your pills and bottles (even the empty ones), to all of your appointments, except for those where a procedure is scheduled. 6. No children or pets:  Find someone to take care of them. It is not appropriate to bring them in. 7. Scheduling changes:  We request "advanced notification" of any changes or cancellations. 8. Advanced notification:  Defined as a time period of more than 24 hours prior to the originally scheduled appointment. This allows for the appointment to be offered to other patients. 9. Rescheduling:  When a visit is rescheduled, it will require the cancellation of the original appointment. For this reason they both fall within the category of "Cancellations".  10. Cancellations:  They require advanced notification. Any cancellation less than 24 hours before the  appointment will be recorded as a "No Show". 11. No Show:  Defined as an unkept appointment where the patient failed to notify or declare to the practice their intention or inability to keep the appointment.  Corrective process for repeat offenders:  1. Tardiness: Three (3) episodes of rescheduling due to late arrivals will be recorded as one (1) "No Show". 2. Cancellation or reschedule: Three (3) cancellations or rescheduling will be recorded as one (1) "No Show". 3. "No Shows": Three (3) "No Shows" within a 12 month period will result in discharge from the practice. ____________________________________________________________________________________________  ____________________________________________________________________________________________  Pain Scale  Introduction: The pain score used by this practice is the Verbal Numerical Rating Scale (VNRS-11). This is an 11-point scale. It is for  adults and children 10 years or older. There are significant differences in how the pain score is reported, used, and applied. Forget everything you learned in the past and learn this scoring system.  General Information: The scale should reflect your current level of pain. Unless you are specifically asked for the level of your worst pain, or your average pain. If you are asked for one of these two, then it should be understood that it is over the past 24 hours.  Basic Activities of Daily Living (ADL): Personal hygiene, dressing, eating, transferring, and using restroom.  Instructions:  Most patients tend to report their level of pain as a combination of two factors, their physical pain and their psychosocial pain. This last one is also known as "suffering" and it is reflection of how physical pain affects you socially and psychologically. From now on, report them separately. From this point on, when asked to report your pain level, report only your physical pain. Use the following table for reference.  Pain Clinic Pain Levels (0-5/10)  Pain Level Score  Description  No Pain 0   Mild pain 1 Nagging, annoying, but does not interfere with basic activities of daily living (ADL). Patients are able to eat, bathe, get dressed, toileting (being able to get on and off the toilet and perform personal hygiene functions), transfer (move in and out of bed or a chair without assistance), and maintain continence (able to control bladder and bowel functions). Blood pressure and heart rate are unaffected. A normal heart rate for a healthy adult ranges from 60 to 100 bpm (beats per minute).   Mild to moderate pain 2 Noticeable and distracting. Impossible to hide from other people. More frequent flare-ups. Still possible to adapt and function close to normal. It can be very annoying and may have occasional stronger flare-ups. With discipline, patients may get used to it and adapt.   Moderate pain 3 Interferes  significantly with activities of daily living (ADL). It becomes difficult to feed, bathe, get dressed, get on and off the toilet or to perform personal hygiene functions. Difficult to get in and out of bed or a chair without assistance. Very distracting. With effort, it can be ignored when deeply involved in activities.   Moderately severe pain 4 Impossible to ignore for more than a few minutes. With effort, patients may still be able to manage work or participate in some social activities. Very difficult to concentrate. Signs of autonomic nervous system discharge are evident: dilated pupils (mydriasis); mild sweating (diaphoresis); sleep interference. Heart rate becomes elevated (>115 bpm). Diastolic blood pressure (lower number) rises above 100 mmHg. Patients find relief in laying down and not moving.   Severe pain 5 Intense and extremely unpleasant. Associated with frowning face and frequent crying. Pain overwhelms the senses.  Ability to do any activity or maintain social relationships becomes significantly limited. Conversation becomes difficult. Pacing back and forth is common, as getting into a comfortable position is nearly impossible. Pain wakes you up from deep sleep. Physical signs will be obvious: pupillary dilation; increased sweating; goosebumps; brisk reflexes; cold, clammy hands and feet; nausea, vomiting or dry heaves; loss of appetite; significant sleep disturbance with inability to fall asleep or to remain asleep. When persistent, significant weight loss is observed due to the complete loss of appetite and sleep deprivation.  Blood pressure and heart rate becomes significantly elevated. Caution: If elevated blood pressure triggers a pounding headache associated with blurred vision, then the patient should immediately seek attention at an urgent or emergency care unit, as these may be signs of an impending stroke.    Emergency Department Pain Levels (6-10/10)  Emergency Room Pain 6  Severely limiting. Requires emergency care and should not be seen or managed at an outpatient pain management facility. Communication becomes difficult and requires great effort. Assistance to reach the emergency department may be required. Facial flushing and profuse sweating along with potentially dangerous increases in heart rate and blood pressure will be evident.   Distressing pain 7 Self-care is very difficult. Assistance is required to transport, or use restroom. Assistance  to reach the emergency department will be required. Tasks requiring coordination, such as bathing and getting dressed become very difficult.   Disabling pain 8 Self-care is no longer possible. At this level, pain is disabling. The individual is unable to do even the most "basic" activities such as walking, eating, bathing, dressing, transferring to a bed, or toileting. Fine motor skills are lost. It is difficult to think clearly.   Incapacitating pain 9 Pain becomes incapacitating. Thought processing is no longer possible. Difficult to remember your own name. Control of movement and coordination are lost.   The worst pain imaginable 10 At this level, most patients pass out from pain. When this level is reached, collapse of the autonomic nervous system occurs, leading to a sudden drop in blood pressure and heart rate. This in turn results in a temporary and dramatic drop in blood flow to the brain, leading to a loss of consciousness. Fainting is one of the body's self defense mechanisms. Passing out puts the brain in a calmed state and causes it to shut down for a while, in order to begin the healing process.    Summary: 1. Refer to this scale when providing [place] with your pain level. 2. Be accurate and careful when reporting your pain level. This will help with your care. 3. Over-reporting your pain level will lead to loss of credibility. 4. Even a level of 1/10 means that there is pain and will be treated at our  facility. 5. High, inaccurate reporting will be documented as "Symptom Exaggeration", leading to loss of credibility and suspicions of possible secondary gains such as obtaining more narcotics, or wanting to appear disabled, for fraudulent reasons. 6. Only pain levels of 5 or below will be seen at our facility. 7. Pain levels of 6 and above will be sent to the Emergency Department and the appointment cancelled. ____________________________________________________________________________________________

## 2018-01-24 NOTE — Patient Instructions (Signed)

## 2018-01-27 LAB — COMPLIANCE DRUG ANALYSIS, UR

## 2018-01-29 LAB — COMP. METABOLIC PANEL (12)
AST: 22 IU/L (ref 0–40)
Albumin/Globulin Ratio: 1.6 (ref 1.2–2.2)
Albumin: 4.4 g/dL (ref 3.5–5.5)
Alkaline Phosphatase: 101 IU/L (ref 39–117)
BUN/Creatinine Ratio: 8 — ABNORMAL LOW (ref 9–20)
BUN: 8 mg/dL (ref 6–20)
Bilirubin Total: 0.3 mg/dL (ref 0.0–1.2)
CALCIUM: 9.9 mg/dL (ref 8.7–10.2)
Chloride: 104 mmol/L (ref 96–106)
Creatinine, Ser: 1.02 mg/dL (ref 0.76–1.27)
GFR, EST AFRICAN AMERICAN: 107 mL/min/{1.73_m2} (ref >59)
GFR, EST NON AFRICAN AMERICAN: 93 mL/min/{1.73_m2} (ref >59)
GLUCOSE: 89 mg/dL (ref 65–99)
Globulin, Total: 2.8 g/dL (ref 1.5–4.5)
Potassium: 4.1 mmol/L (ref 3.5–5.2)
Sodium: 144 mmol/L (ref 134–144)
Total Protein: 7.2 g/dL (ref 6.0–8.5)

## 2018-01-29 LAB — SEDIMENTATION RATE: SED RATE: 8 mm/h (ref 0–15)

## 2018-01-29 LAB — MAGNESIUM: Magnesium: 2.1 mg/dL (ref 1.6–2.3)

## 2018-01-29 LAB — 25-HYDROXYVITAMIN D LCMS D2+D3
25-HYDROXY, VITAMIN D-3: 26 ng/mL
25-HYDROXY, VITAMIN D: 26 ng/mL — AB

## 2018-01-29 LAB — VITAMIN B12: Vitamin B-12: 462 pg/mL (ref 232–1245)

## 2018-01-29 LAB — C-REACTIVE PROTEIN: CRP: 13 mg/L — AB (ref 0–10)

## 2018-02-14 ENCOUNTER — Ambulatory Visit: Payer: BLUE CROSS/BLUE SHIELD | Admitting: Pain Medicine

## 2018-03-01 DIAGNOSIS — M961 Postlaminectomy syndrome, not elsewhere classified: Secondary | ICD-10-CM | POA: Insufficient documentation

## 2018-03-01 DIAGNOSIS — M960 Pseudarthrosis after fusion or arthrodesis: Secondary | ICD-10-CM | POA: Insufficient documentation

## 2018-03-01 DIAGNOSIS — E559 Vitamin D deficiency, unspecified: Secondary | ICD-10-CM | POA: Insufficient documentation

## 2018-03-01 DIAGNOSIS — R7982 Elevated C-reactive protein (CRP): Secondary | ICD-10-CM | POA: Insufficient documentation

## 2018-03-01 NOTE — Progress Notes (Signed)
Patient's Name: Derek [name]  MRN: [id number]  Referring Provider: Jodi Marble, MD  DOB: 06-07-1981  PCP: Jodi Marble, MD  DOS: 03/02/2018  Note by: Gaspar Cola, MD  Service setting: Ambulatory outpatient  Specialty: Interventional Pain Management  Location: [facility] (AMB) Pain Management Facility    Patient type: Established   Primary Reason(s) for Visit: Encounter for evaluation before starting new chronic pain management plan of care (Level of risk: moderate) CC: Back Pain (lower and mid)  HPI  Derek [name] is a 37 y.o. year old, male patient, who comes today for a follow-up evaluation to review the test results and decide on a treatment plan. He has Lumbar pseudoarthrosis (L5-S1); Tobacco use; Chronic low back pain (Primary Area of Pain) (Bilateral) (L>R) w/ sciatica (Bilateral); Chronic lower extremity pain (Secondary Area of Pain) (Bilateral) (L>R); Chronic pain syndrome; Long term current use of opiate analgesic; Pharmacologic therapy; Disorder of skeletal system; Problems influencing health status; Elevated C-reactive protein (CRP); Vitamin D insufficiency; Failed back surgical syndrome (x3); L5-S1 pseudoarthrosis; Chronic musculoskeletal pain; Spasm of back muscles; Sacroiliac joint dysfunction (Bilateral); Chronic sacroiliac joint pain (Bilateral); Somatic dysfunction of sacroiliac joints (Bilateral); Chronic hip pain (Bilateral); Epidural fibrosis; Lumbar postlaminectomy syndrome; DDD (degenerative disc disease), lumbosacral; Lumbar facet syndrome (Bilateral); Other specified dorsopathies, sacral and sacrococcygeal region; Spondylosis without myelopathy or radiculopathy, lumbosacral region; and Neurogenic pain on their problem list. His primarily concern today is the Back Pain (lower and mid)  Pain Assessment: Location: Lower, Mid Back Radiating: Pain radiaties down left leg to heel Onset: More than a month ago Duration: Chronic pain Quality: Aching, Dull, Sharp Severity:  8 /10 (subjective, self-reported pain score)  Note: Reported level is inconsistent with clinical observations. Clinically the patient looks like a 3/10 A 3/10 is viewed as "Moderate" and described as significantly interfering with activities of daily living (ADL). It becomes difficult to feed, bathe, get dressed, get on and off the toilet or to perform personal hygiene functions. Difficult to get in and out of bed or a chair without assistance. Very distracting. With effort, it can be ignored when deeply involved in activities. Information on the proper use of the pain scale provided to the patient today. When using our objective Pain Scale, levels between 6 and 10/10 are said to belong in an emergency room, as it progressively worsens from a 6/10, described as severely limiting, requiring emergency care not usually available at an outpatient pain management facility. At a 6/10 level, communication becomes difficult and requires great effort. Assistance to reach the emergency department may be required. Facial flushing and profuse sweating along with potentially dangerous increases in heart rate and blood pressure will be evident. Effect on ADL: limits my daily activities Timing: Constant Modifying factors: hot shower, heating pad, ice pad ,medications BP: 129/79  HR: 87  Derek [name] comes in today for a follow-up visit after his initial evaluation on 01/24/2018. Today we went over the results of his tests. These were explained in "Layman's terms". During today's appointment we went over my diagnostic impression, as well as the proposed treatment plan.  According to the patient his primary area of pain is in his lower back (B) (L>R).  He admits that this is related to an fall that he suffered in his driveway approximately 11 years ago.  He is s/p lumbar spinal surgery x3.  He admits his first surgery was in April 2008 Dr. Sherwood Gambler.  He has had 2 additional surgeries by Dr. Arnoldo Morale last  being 05/29/2016 spinal  fusion.  He admits that he has been treated with interventional therapy in the past by Dr. Primus Bravo and Dr. Brien Few.  He does not feel like the epidural steroids were effective.  He admits that the first injection was effective for about 4 to 5 days (Epidural done at [facility]).  Had bad experiences w/ Dr. Brien Few [facility]) having difficulties getting into the space (TPI? Vs Facet?). Apparently he had problems w/ scar tissue. Has also been to [facility]. His last physical therapy was x 8 weeks in 2016 (made pain worse).  Denies any recent physical therapy.  He admits that he does see Dr. Laveda Abbe chiropractor monthly (helps w/ mid-back & neck).  He admits that he had images at [facility]. Worse in the mornings.  His second area pain is in his legs.  He admits the left is greater than the right.  He admits that the pain goes down the back of his leg into his heels (B).  He describes it as numbness with prolonged sitting or standing over right thigh.  He also has tingling. He admits that he has swelling and cramping in his legs (B) in the mornings.  He admits that he did have a nerve conduction study in the past while being treated by Dr. Primus Bravo.  His third  area of pain is his mid- to upper back, due to spasms.  He admits that he does have muscle spasms in the lower back and they go up into his upper back, last one about 6 months ago. Steroids and muscle relaxants may take about a day to improve. Toradol IM helps with the spasms. He admits that the Flexeril (only at night because it makes him drowsy), tizanidine (BID during the day) and Percocet are not effective when this happens.  Next pain is the neck, which is intermittent.   In considering the treatment plan options, Derek [name] was reminded that I no longer take patients for medication management only. I asked him to let me know if he had no intention of taking advantage of the interventional therapies, so that we could make  arrangements to provide this space to someone interested. I also made it clear that undergoing interventional therapies for the purpose of getting pain medications is very inappropriate on the part of a patient, and it will not be tolerated in this practice. This type of behavior would suggest true addiction and therefore it requires referral to an addiction specialist.   Further details on both, my assessment(s), as well as the proposed treatment plan, please see below.  Controlled Substance Pharmacotherapy Assessment REMS (Risk Evaluation and Mitigation Strategy)  Analgesic: Oxycodone/acetaminophen 10/325 mg (fill date 01/07/2018) oxycodone 40 mg/day Highest recorded MME/day: 112.5 mg/day MME/day: 60 mg/day Pill Count: None expected due to no prior prescriptions written by our practice. No notes on file Pharmacokinetics: Liberation and absorption (onset of action): WNL Distribution (time to peak effect): WNL Metabolism and excretion (duration of action): WNL         Pharmacodynamics: Desired effects: Analgesia: Mr. Zwahlen reports >50% benefit. Functional ability: Patient reports that medication allows him to accomplish basic ADLs Clinically meaningful improvement in function (CMIF): Sustained CMIF goals met Perceived effectiveness: Described as relatively effective, allowing for increase in activities of daily living (ADL) Undesirable effects: Side-effects or Adverse reactions: None reported Monitoring: [place] PMP: Online review of the past 65-monthperiod previously conducted. Not applicable at this point since we have not taken over the  patient's medication management yet. List of other Serum/Urine Drug Screening Test(s):  No results found. List of all UDS test(s) done:  Lab Results  Component Value Date   SUMMARY FINAL 01/24/2018   Last UDS on record: Summary  Date Value Ref Range Status  01/24/2018 FINAL  Final    Comment:     ==================================================================== TOXASSURE COMP DRUG ANALYSIS,UR ==================================================================== Test                             Result       Flag       Units Drug Present and Declared for Prescription Verification   Oxycodone                      1585         EXPECTED   ng/mg creat   Oxymorphone                    3751         EXPECTED   ng/mg creat   Noroxycodone                   2191         EXPECTED   ng/mg creat   Noroxymorphone                 431          EXPECTED   ng/mg creat    Sources of oxycodone are scheduled prescription medications.    Oxymorphone, noroxycodone, and noroxymorphone are expected    metabolites of oxycodone. Oxymorphone is also available as a    scheduled prescription medication.   Acetaminophen                  PRESENT      EXPECTED Drug Absent but Declared for Prescription Verification   Cyclobenzaprine                Not Detected UNEXPECTED   Tizanidine                     Not Detected UNEXPECTED    Tizanidine, as indicated in the declared medication list, is not    always detected even when used as directed. ==================================================================== Test                      Result    Flag   Units      Ref Range   Creatinine              110              mg/dL      >=20 ==================================================================== Declared Medications:  The flagging and interpretation on this report are based on the  following declared medications.  Unexpected results may arise from  inaccuracies in the declared medications.  **Note: The testing scope of this panel includes these medications:  Cyclobenzaprine  Oxycodone (Oxycodone Acetaminophen)  **Note: The testing scope of this panel does not include small to  moderate amounts of these reported medications:  Acetaminophen (Oxycodone Acetaminophen)   Tizanidine ==================================================================== For clinical consultation, please call [phone]. ====================================================================    UDS interpretation: Unexpected findings not considered significantly abnormal.          Medication Assessment Form: Patient introduced to form today Treatment compliance: Treatment may start today if patient agrees with proposed plan. Evaluation  of compliance is not applicable at this point Risk Assessment Profile: Aberrant behavior: See initial evaluations. None observed or detected today Comorbid factors increasing risk of overdose: See initial evaluation. No additional risks detected today Opioid risk tool (ORT) (Total Score): 1 Personal History of Substance Abuse (SUD-Substance use disorder):  Alcohol: Negative  Illegal Drugs: Negative  Rx Drugs: Negative  ORT Risk Level calculation: Low Risk Risk of substance use disorder (SUD): Low Opioid Risk Tool - 03/02/18 1457      Family History of Substance Abuse   Alcohol  Positive Male    Illegal Drugs  Negative    Rx Drugs  Negative      Personal History of Substance Abuse   Alcohol  Negative    Illegal Drugs  Negative    Rx Drugs  Negative      History of Preadolescent Sexual Abuse   History of Preadolescent Sexual Abuse  Negative or Male      Total Score   Opioid Risk Tool Scoring  1    Opioid Risk Interpretation  Low Risk      ORT Scoring interpretation table:  Score <3 = Low Risk for SUD  Score between 4-7 = Moderate Risk for SUD  Score >8 = High Risk for Opioid Abuse   Risk Mitigation Strategies:  Patient opioid safety counseling: Completed today. Counseling provided to patient as per "Patient Counseling Document". Document signed by patient, attesting to counseling and understanding Patient-Prescriber Agreement (PPA): Obtained today.  Controlled substance notification to other providers: Written and sent  today.  Pharmacologic Plan: Today we may be taking over the patient's pharmacological regimen. See below.             Laboratory Chemistry  Inflammation Markers (CRP: Acute Phase) (ESR: Chronic Phase) Lab Results  Component Value Date   CRP 13 (H) 01/24/2018   ESRSEDRATE 8 01/24/2018                         Rheumatology Markers No results found.  Renal Function Markers Lab Results  Component Value Date   BUN 8 01/24/2018   CREATININE 1.02 01/24/2018   BCR 8 (L) 01/24/2018   GFRAA 107 01/24/2018   GFRNONAA 93 01/24/2018                             Hepatic Function Markers Lab Results  Component Value Date   AST 22 01/24/2018   ALBUMIN 4.4 01/24/2018   ALKPHOS 101 01/24/2018                        Electrolytes Lab Results  Component Value Date   NA 144 01/24/2018   K 4.1 01/24/2018   CL 104 01/24/2018   CALCIUM 9.9 01/24/2018   MG 2.1 01/24/2018                        Neuropathy Markers Lab Results  Component Value Date   VITAMINB12 462 01/24/2018                        Bone Pathology Markers Lab Results  Component Value Date   25OHVITD1 26 (L) 01/24/2018   25OHVITD2 <1.0 01/24/2018   25OHVITD3 26 01/24/2018  Coagulation Parameters Lab Results  Component Value Date   PLT 248 02/21/2015                        Cardiovascular Markers Lab Results  Component Value Date   HGB 13.1 02/21/2015   HCT 39.9 02/21/2015                         CA Markers No results found.  Note: Lab results reviewed.  Recent Diagnostic Imaging Review  Lumbosacral Imaging: Lumbar CT wo contrast:  Results for orders placed during the hospital encounter of 02/20/15  CT Lumbar Spine Wo Contrast   Narrative CLINICAL DATA:  Brain lab protocol. Preop spine to sacrum fusion. History of back pain with radicular symptoms in both legs.  EXAM: CT LUMBAR SPINE WITHOUT CONTRAST  TECHNIQUE: Multidetector CT imaging of the lumbar spine was performed  without intravenous contrast administration. Multiplanar CT image reconstructions were also generated.  COMPARISON:  None.  FINDINGS: 1 mm contiguous images were obtained from L2 through the coccyx with patient prone in preparation for brain lab localization for surgical fusion. The patient has undergone previous L4-S1 posterior and interbody fusion. L4-5 fusion is solid. There is a pseudarthrosis at L5-S1. No solid interbody or posterior arthrodesis. BILATERAL S1 screw loosening. Hardware appropriately placed. No worrisome paravertebral findings. Unremarkable sacroiliac joints. Grossly unremarkable visualized abdominal soft tissues.  IMPRESSION: Brain lab protocol in preparation for fusion revision. Solid L4-5 fusion. L5-S1 pseudarthrosis.   Electronically Signed   By: Staci Righter M.D.   On: 02/20/2015 08:26    Lumbar DG 1V:  Results for orders placed during the hospital encounter of 05/29/13  DG Lumbar Spine 1 View   Narrative CLINICAL DATA:  L4-L5 and L5-S1 lumbar surgery  EXAM: LUMBAR SPINE - 1 VIEW  COMPARISON:  MRI lumbar spine 11/06/2008  FINDINGS: Prior MRI labeled with 5 lumbar vertebrae.  Cross-table lateral view #1 at 0806 hr interpreted at 0350 hr.  Metallic probe via posterior approach projects dorsal to the L5-S1 disc space level.  Tissue spreaders and a surgical sponge are identified dorsal to L4 and L5.  IMPRESSION: Dorsal localization of the L5-S1 disc space level.   Electronically Signed   By: Lavonia Dana M.D.   On: 05/29/2013 11:57    Lumbar DG 2-3 views:  Results for orders placed during the hospital encounter of 04/19/15  DG Lumbar Spine 2-3 Views   Narrative CLINICAL DATA:  Persistent lumbago for 1 day.  Prior spinal fusion.  EXAM: LUMBAR SPINE - 2-3 VIEW  COMPARISON:  CT lumbar spine February 20, 2015  FINDINGS: Frontal, lateral, and spot lumbosacral lateral images were obtained. There is pedicle screw and plate fixation at  L4, L5, and S1. There are also screws transfixing each sacroiliac joint. The screw and plate fixation devices appear intact as do disc spacers at L4-5 and L5-S1. The positioning of the pedicle screws appears unchanged compared to prior CT examination.  There is no fracture or spondylolisthesis. Disc spaces appear unchanged ; the disc spaces superior to the postoperative areas appear unremarkable. No erosive change or bony destruction.  IMPRESSION: Postoperative change at multiple levels, stable. Note that there is now screw fixation to each sacroiliac joint. No fracture or spondylolisthesis. No erosive change or bony destruction apparent.   Electronically Signed   By: Lowella Grip III M.D.   On: 04/19/2015 14:51        Complexity Note:  Imaging results reviewed. Results shared with Mr. Bookwalter, using Layman's terms.                         Meds   Current Outpatient Medications:  .  cyclobenzaprine (FLEXERIL) 10 MG tablet, Take 1 tablet (10 mg total) by mouth at bedtime., Disp: 30 tablet, Rfl: 0 .  oxyCODONE-acetaminophen (PERCOCET) 10-325 MG tablet, Take 1 tablet by mouth every 6 (six) hours as needed for pain., Disp: 100 tablet, Rfl: 0 .  tiZANidine (ZANAFLEX) 4 MG tablet, Take 1 tablet (4 mg total) by mouth 2 (two) times daily as needed for muscle spasms (For muscle pain/spasm during the day.)., Disp: 60 tablet, Rfl: 0 .  Calcium Carbonate-Vit D-Min (GNP CALCIUM 1200) 1200-1000 MG-UNIT CHEW, Chew 1,200 mg by mouth daily with breakfast. Take in combination with vitamin D and magnesium., Disp: 30 tablet, Rfl: 5 .  Cholecalciferol (VITAMIN D3) 5000 units CAPS, Take 1 capsule (5,000 Units total) by mouth daily with breakfast. Take along with calcium and magnesium., Disp: 30 capsule, Rfl: 5 .  [START ON 03/03/2018] ergocalciferol (VITAMIN D2) 50000 units capsule, Take 1 capsule (50,000 Units total) by mouth 2 (two) times a week. X 6 weeks., Disp: 12 capsule, Rfl: 0 .  Magnesium 500  MG CAPS, Take 1 capsule (500 mg total) by mouth 2 (two) times daily at 8 am and 10 pm., Disp: 60 capsule, Rfl: 5  ROS  Constitutional: Denies any fever or chills Gastrointestinal: No reported hemesis, hematochezia, vomiting, or acute GI distress Musculoskeletal: Denies any acute onset joint swelling, redness, loss of ROM, or weakness Neurological: No reported episodes of acute onset apraxia, aphasia, dysarthria, agnosia, amnesia, paralysis, loss of coordination, or loss of consciousness  Allergies  Mr. Nelles is allergic to amoxicillin and vicodin [hydrocodone-acetaminophen].  [place]  Drug: Mr. Sellman  reports that he does not use drugs. Alcohol:  reports that he drinks alcohol. Tobacco:  reports that he has been smoking. He has a 16.00 pack-year smoking history. He has never used smokeless tobacco. Medical:  has a past medical history of Anxiety, Arthritis, and GERD (gastroesophageal reflux disease). Surgical: Mr. Hoogland  has a past surgical history that includes Back surgery; HAND REIMPLANTED; and Lumbar fusion (11/14). Family: family history is not on file.  Constitutional Exam  General appearance: Well nourished, well developed, and well hydrated. In no apparent acute distress Vitals:   03/02/18 1447  BP: 129/79  Pulse: 87  Temp: 98.7 F (37.1 C)  SpO2: 97%  Weight: 181 lb (82.1 kg)  Height: '5\' 10"'  (1.778 m)   BMI Assessment: Estimated body mass index is 25.97 kg/m as calculated from the following:   Height as of this encounter: '5\' 10"'  (1.778 m).   Weight as of this encounter: 181 lb (82.1 kg).  BMI interpretation table: BMI level Category Range association with higher incidence of chronic pain  <18 kg/m2 Underweight   18.5-24.9 kg/m2 Ideal body weight   25-29.9 kg/m2 Overweight Increased incidence by 20%  30-34.9 kg/m2 Obese (Class I) Increased incidence by 68%  35-39.9 kg/m2 Severe obesity (Class II) Increased incidence by 136%  >40 kg/m2 Extreme obesity (Class III)  Increased incidence by 254%   Patient's current BMI Ideal Body weight  Body mass index is 25.97 kg/m. Ideal body weight: 73 kg (160 lb 15 oz) Adjusted ideal body weight: 76.6 kg (168 lb 15.4 oz)   BMI Readings from Last 4 Encounters:  03/02/18 25.97 kg/m  01/24/18 26.40  kg/m  09/02/16 25.11 kg/m  02/20/15 23.39 kg/m   Wt Readings from Last 4 Encounters:  03/02/18 181 lb (82.1 kg)  01/24/18 184 lb (83.5 kg)  09/02/16 175 lb (79.4 kg)  02/20/15 163 lb (73.9 kg)  Psych/Mental status: Alert, oriented x 3 (person, place, & time)       Eyes: PERLA Respiratory: No evidence of acute respiratory distress  Cervical Spine Area Exam  Skin & Axial Inspection: No masses, redness, edema, swelling, or associated skin lesions Alignment: Symmetrical Functional ROM: Unrestricted ROM      Stability: No instability detected Muscle Tone/Strength: Functionally intact. No obvious neuro-muscular anomalies detected. Sensory (Neurological): Unimpaired Palpation: No palpable anomalies              Upper Extremity (UE) Exam    Side: Right upper extremity  Side: Left upper extremity  Skin & Extremity Inspection: Skin color, temperature, and hair growth are WNL. No peripheral edema or cyanosis. No masses, redness, swelling, asymmetry, or associated skin lesions. No contractures.  Skin & Extremity Inspection: Skin color, temperature, and hair growth are WNL. No peripheral edema or cyanosis. No masses, redness, swelling, asymmetry, or associated skin lesions. No contractures.  Functional ROM: Unrestricted ROM          Functional ROM: Unrestricted ROM          Muscle Tone/Strength: Functionally intact. No obvious neuro-muscular anomalies detected.  Muscle Tone/Strength: Functionally intact. No obvious neuro-muscular anomalies detected.  Sensory (Neurological): Unimpaired          Sensory (Neurological): Unimpaired          Palpation: No palpable anomalies              Palpation: No palpable anomalies               Provocative Test(s):  Phalen's test: deferred Tinel's test: deferred Apley's scratch test (touch opposite shoulder):  Action 1 (Across chest): deferred Action 2 (Overhead): deferred Action 3 (LB reach): deferred   Provocative Test(s):  Phalen's test: deferred Tinel's test: deferred Apley's scratch test (touch opposite shoulder):  Action 1 (Across chest): deferred Action 2 (Overhead): deferred Action 3 (LB reach): deferred    Thoracic Spine Area Exam  Skin & Axial Inspection: No masses, redness, or swelling Alignment: Symmetrical Functional ROM: Unrestricted ROM Stability: No instability detected Muscle Tone/Strength: Functionally intact. No obvious neuro-muscular anomalies detected. Sensory (Neurological): Unimpaired Muscle strength & Tone: No palpable anomalies  Lumbar Spine Area Exam  Skin & Axial Inspection: No masses, redness, or swelling Alignment: Symmetrical Functional ROM: Decreased ROM       Stability: No instability detected Muscle Tone/Strength: Increased muscle tone over affected area Sensory (Neurological): Movement-associated pain Palpation: Complains of area being tender to palpation       Provocative Tests: Hyperextension/rotation test: (+) bilaterally for facet joint pain. Lumbar quadrant test (Kemp's test): (+) bilaterally for facet joint pain. Lateral bending test: deferred today       Patrick's Maneuver: (+) for bilateral S-I arthralgia and for bilateral hip arthralgia (L>R) FABER test: (+) for bilateral S-I arthralgia and for bilateral hip arthralgia (L>R) S-I anterior distraction/compression test: deferred today         S-I lateral compression test: deferred today         S-I Thigh-thrust test: deferred today         S-I Gaenslen's test: deferred today          Gait & Posture Assessment  Ambulation: Unassisted Gait: Relatively normal for age  and body habitus Posture: WNL   Lower Extremity Exam    Side: Right lower extremity  Side: Left  lower extremity  Stability: No instability observed          Stability: No instability observed          Skin & Extremity Inspection: Skin color, temperature, and hair growth are WNL. No peripheral edema or cyanosis. No masses, redness, swelling, asymmetry, or associated skin lesions. No contractures.  Skin & Extremity Inspection: Skin color, temperature, and hair growth are WNL. No peripheral edema or cyanosis. No masses, redness, swelling, asymmetry, or associated skin lesions. No contractures.  Functional ROM: Unrestricted ROM                  Functional ROM: Unrestricted ROM                  Muscle Tone/Strength: Able to Toe-walk & Heel-walk without problems  Muscle Tone/Strength: Able to Toe-walk & Heel-walk without problems  Sensory (Neurological): Dermatomal pain pattern (S1)  Sensory (Neurological): Dermatomal pain pattern  Palpation: No palpable anomalies  Palpation: No palpable anomalies   Assessment & Plan  Primary Diagnosis & Pertinent Problem List: The primary encounter diagnosis was Chronic pain syndrome. Diagnoses of Chronic low back pain (Primary Area of Pain) (Bilateral) (L>R) w/ sciatica (Bilateral), Failed back surgical syndrome, Lumbar postlaminectomy syndrome, L5-S1 pseudarthrosis, Lumbar pseudoarthrosis (L5-S1), DDD (degenerative disc disease), lumbar, Epidural fibrosis, Lumbar facet syndrome (Bilateral), Spondylosis without myelopathy or radiculopathy, lumbosacral region, Spasm of back muscles, Chronic sacroiliac joint pain (Bilateral), Sacroiliac joint dysfunction (Bilateral), Somatic dysfunction of sacroiliac joints (Bilateral), Other specified dorsopathies, sacral and sacrococcygeal region, Chronic lower extremity pain (Secondary Area of Pain) (Bilateral) (L>R), Chronic hip pain (Bilateral), Neurogenic pain, Chronic musculoskeletal pain, Pharmacologic therapy, Elevated C-reactive protein (CRP), and Vitamin D insufficiency were also pertinent to this visit.  Visit  Diagnosis: 1. Chronic pain syndrome   2. Chronic low back pain (Primary Area of Pain) (Bilateral) (L>R) w/ sciatica (Bilateral)   3. Failed back surgical syndrome   4. Lumbar postlaminectomy syndrome   5. L5-S1 pseudarthrosis   6. Lumbar pseudoarthrosis (L5-S1)   7. DDD (degenerative disc disease), lumbar   8. Epidural fibrosis   9. Lumbar facet syndrome (Bilateral)   10. Spondylosis without myelopathy or radiculopathy, lumbosacral region   11. Spasm of back muscles   12. Chronic sacroiliac joint pain (Bilateral)   13. Sacroiliac joint dysfunction (Bilateral)   14. Somatic dysfunction of sacroiliac joints (Bilateral)   15. Other specified dorsopathies, sacral and sacrococcygeal region   16. Chronic lower extremity pain (Secondary Area of Pain) (Bilateral) (L>R)   17. Chronic hip pain (Bilateral)   18. Neurogenic pain   19. Chronic musculoskeletal pain   20. Pharmacologic therapy   21. Elevated C-reactive protein (CRP)   22. Vitamin D insufficiency    Problems updated and reviewed during this visit: Problem  Chronic Musculoskeletal Pain  Spasm of Back Muscles  Sacroiliac joint dysfunction (Bilateral)  Chronic sacroiliac joint pain (Bilateral)  Somatic dysfunction of sacroiliac joints (Bilateral)  Chronic hip pain (Bilateral)  Epidural Fibrosis  Lumbar postlaminectomy syndrome  Ddd (Degenerative Disc Disease), Lumbosacral  Lumbar facet syndrome (Bilateral)  Other Specified Dorsopathies, Sacral and Sacrococcygeal Region  Spondylosis Without Myelopathy Or Radiculopathy, Lumbosacral Region  Neurogenic Pain  Failed back surgical syndrome (x3)   previous L4-S1 posterior and interbody fusion   L5-S1 pseudoarthrosis  Lumbar pseudoarthrosis (L5-S1)   Time Note: Greater than 50% of the 40 minute(s) of  face-to-face time spent with Mr. Bracken, was spent in counseling/coordination of care regarding: the appropriate use of the pain scale, Mr. Henes primary cause of pain, the results of  his recent test(s), the significance of each one oth the test(s) anomalies and it's corresponding characteristic pain pattern(s), the treatment plan, treatment alternatives, the risks and possible complications of proposed treatment, the opioid analgesic risks and possible complications, realistic expectations, the goals of pain management (increased in functionality), the medication agreement and the need to collect and read the AVS material.  Plan of Care  Pharmacotherapy (Medications Ordered): Meds ordered this encounter  Medications  . ergocalciferol (VITAMIN D2) 50000 units capsule    Sig: Take 1 capsule (50,000 Units total) by mouth 2 (two) times a week. X 6 weeks.    Dispense:  12 capsule    Refill:  0    Do not add this medication to the electronic "Automatic Refill" notification system. Patient may have prescription filled one day early if pharmacy is closed on scheduled refill date.  . Cholecalciferol (VITAMIN D3) 5000 units CAPS    Sig: Take 1 capsule (5,000 Units total) by mouth daily with breakfast. Take along with calcium and magnesium.    Dispense:  30 capsule    Refill:  5    Do not place medication on "Automatic Refill".  May substitute with similar over-the-counter product.  . Magnesium 500 MG CAPS    Sig: Take 1 capsule (500 mg total) by mouth 2 (two) times daily at 8 am and 10 pm.    Dispense:  60 capsule    Refill:  5    Do not place medication on "Automatic Refill".  The patient may use similar over-the-counter product.  . Calcium Carbonate-Vit D-Min (GNP CALCIUM 1200) 1200-1000 MG-UNIT CHEW    Sig: Chew 1,200 mg by mouth daily with breakfast. Take in combination with vitamin D and magnesium.    Dispense:  30 tablet    Refill:  5    Do not place medication on "Automatic Refill".  May substitute with similar over-the-counter product.  . orphenadrine (NORFLEX) injection 60 mg  . ketorolac (TORADOL) injection 60 mg  . cyclobenzaprine (FLEXERIL) 10 MG tablet    Sig:  Take 1 tablet (10 mg total) by mouth at bedtime.    Dispense:  30 tablet    Refill:  0    Do not place medication on "Automatic Refill". Fill one day early if pharmacy is closed on scheduled refill date.  Marland Kitchen tiZANidine (ZANAFLEX) 4 MG tablet    Sig: Take 1 tablet (4 mg total) by mouth 2 (two) times daily as needed for muscle spasms (For muscle pain/spasm during the day.).    Dispense:  60 tablet    Refill:  0    Do not place medication on "Automatic Refill". Fill one day early if pharmacy is closed on scheduled refill date.  Marland Kitchen oxyCODONE-acetaminophen (PERCOCET) 10-325 MG tablet    Sig: Take 1 tablet by mouth every 6 (six) hours as needed for pain.    Dispense:  100 tablet    Refill:  0    Medication for Chronic Pain (G89.4).  STOP ACT - Not applicable. Fill one day early if pharmacy is closed on scheduled refill date.  Do not fill until: 03/02/18 To last until: 04/01/18    Procedure Orders     Caudal Epidural Injection Lab Orders  No laboratory test(s) ordered today   Imaging Orders  No imaging studies ordered today  Referral Orders  No referral(s) requested today    Pharmacological management options:  Opioid Analgesics: We'll take over management today. See above orders Membrane stabilizer: We have discussed the possibility of optimizing this mode of therapy, if tolerated Muscle relaxant: We have discussed the possibility of a trial NSAID: We have discussed the possibility of a trial Other analgesic(s): To be determined at a later time   Interventional management options: Planned, scheduled, and/or pending:    Diagnostic (Midline) caudal ESI #1+ diagnostic epidurogram under fluoroscopic guidance and IV sedation    Considering:   Diagnostic (Midline) caudal ESI #1+ diagnostic epidurogram  Possible Racz procedure  Diagnostic bilateral lumbar facet block  Possible bilateral lumbar facet RFA  Diagnostic bilateral sacroiliac joint block  Possible bilateral sacroiliac  joint RFA  Possible spinal cord stimulator trial    PRN Procedures:   None at this time   Provider-requested follow-up: Return for Procedure (w/ sedation): (ML) Caudal ESI #1 + Epidurogram.  Future Appointments  Date Time Provider [place]  03/08/2018  9:45 AM Milinda Pointer, MD [facility] None    Primary Care Physician: Jodi Marble, MD Location: [facility] Note by: Gaspar Cola, MD Date: 03/02/2018; Time: 4:36 PM

## 2018-03-02 ENCOUNTER — Encounter: Payer: Self-pay | Admitting: Pain Medicine

## 2018-03-02 ENCOUNTER — Ambulatory Visit: Payer: BLUE CROSS/BLUE SHIELD | Attending: Pain Medicine | Admitting: Pain Medicine

## 2018-03-02 ENCOUNTER — Other Ambulatory Visit: Payer: Self-pay

## 2018-03-02 VITALS — BP 129/79 | HR 87 | Temp 98.7°F | Ht 70.0 in | Wt 181.0 lb

## 2018-03-02 DIAGNOSIS — M25551 Pain in right hip: Secondary | ICD-10-CM | POA: Insufficient documentation

## 2018-03-02 DIAGNOSIS — M25552 Pain in left hip: Secondary | ICD-10-CM | POA: Diagnosis not present

## 2018-03-02 DIAGNOSIS — M47817 Spondylosis without myelopathy or radiculopathy, lumbosacral region: Secondary | ICD-10-CM | POA: Diagnosis not present

## 2018-03-02 DIAGNOSIS — M5388 Other specified dorsopathies, sacral and sacrococcygeal region: Secondary | ICD-10-CM

## 2018-03-02 DIAGNOSIS — G9619 Other disorders of meninges, not elsewhere classified: Secondary | ICD-10-CM

## 2018-03-02 DIAGNOSIS — M7918 Myalgia, other site: Secondary | ICD-10-CM

## 2018-03-02 DIAGNOSIS — G8929 Other chronic pain: Secondary | ICD-10-CM | POA: Diagnosis present

## 2018-03-02 DIAGNOSIS — Z79891 Long term (current) use of opiate analgesic: Secondary | ICD-10-CM | POA: Insufficient documentation

## 2018-03-02 DIAGNOSIS — M546 Pain in thoracic spine: Secondary | ICD-10-CM | POA: Diagnosis present

## 2018-03-02 DIAGNOSIS — M960 Pseudarthrosis after fusion or arthrodesis: Secondary | ICD-10-CM | POA: Diagnosis not present

## 2018-03-02 DIAGNOSIS — E559 Vitamin D deficiency, unspecified: Secondary | ICD-10-CM

## 2018-03-02 DIAGNOSIS — M79605 Pain in left leg: Secondary | ICD-10-CM | POA: Insufficient documentation

## 2018-03-02 DIAGNOSIS — M47816 Spondylosis without myelopathy or radiculopathy, lumbar region: Secondary | ICD-10-CM | POA: Diagnosis not present

## 2018-03-02 DIAGNOSIS — M5137 Other intervertebral disc degeneration, lumbosacral region: Secondary | ICD-10-CM | POA: Diagnosis not present

## 2018-03-02 DIAGNOSIS — M5136 Other intervertebral disc degeneration, lumbar region: Secondary | ICD-10-CM

## 2018-03-02 DIAGNOSIS — S32009K Unspecified fracture of unspecified lumbar vertebra, subsequent encounter for fracture with nonunion: Secondary | ICD-10-CM

## 2018-03-02 DIAGNOSIS — M961 Postlaminectomy syndrome, not elsewhere classified: Secondary | ICD-10-CM | POA: Diagnosis not present

## 2018-03-02 DIAGNOSIS — M792 Neuralgia and neuritis, unspecified: Secondary | ICD-10-CM

## 2018-03-02 DIAGNOSIS — G96198 Other disorders of meninges, not elsewhere classified: Secondary | ICD-10-CM

## 2018-03-02 DIAGNOSIS — R252 Cramp and spasm: Secondary | ICD-10-CM | POA: Insufficient documentation

## 2018-03-02 DIAGNOSIS — M5442 Lumbago with sciatica, left side: Secondary | ICD-10-CM | POA: Insufficient documentation

## 2018-03-02 DIAGNOSIS — M545 Low back pain: Secondary | ICD-10-CM | POA: Diagnosis present

## 2018-03-02 DIAGNOSIS — M62830 Muscle spasm of back: Secondary | ICD-10-CM

## 2018-03-02 DIAGNOSIS — M533 Sacrococcygeal disorders, not elsewhere classified: Secondary | ICD-10-CM | POA: Insufficient documentation

## 2018-03-02 DIAGNOSIS — M79604 Pain in right leg: Secondary | ICD-10-CM | POA: Diagnosis not present

## 2018-03-02 DIAGNOSIS — Z981 Arthrodesis status: Secondary | ICD-10-CM | POA: Diagnosis not present

## 2018-03-02 DIAGNOSIS — M9904 Segmental and somatic dysfunction of sacral region: Secondary | ICD-10-CM | POA: Insufficient documentation

## 2018-03-02 DIAGNOSIS — M5441 Lumbago with sciatica, right side: Secondary | ICD-10-CM | POA: Diagnosis not present

## 2018-03-02 DIAGNOSIS — R7982 Elevated C-reactive protein (CRP): Secondary | ICD-10-CM

## 2018-03-02 DIAGNOSIS — G894 Chronic pain syndrome: Secondary | ICD-10-CM | POA: Insufficient documentation

## 2018-03-02 DIAGNOSIS — Z79899 Other long term (current) drug therapy: Secondary | ICD-10-CM

## 2018-03-02 MED ORDER — ERGOCALCIFEROL 1.25 MG (50000 UT) PO CAPS: 50000.0000 [IU] | ORAL_CAPSULE | ORAL | 0 refills | Status: AC

## 2018-03-02 MED ORDER — KETOROLAC TROMETHAMINE 60 MG/2ML IM SOLN
60.0000 mg | Freq: Once | INTRAMUSCULAR | Status: AC
  Administered 2018-03-02: 60 mg via INTRAMUSCULAR

## 2018-03-02 MED ORDER — CYCLOBENZAPRINE HCL 10 MG PO TABS: 10.0000 mg | ORAL_TABLET | Freq: Every day | ORAL | 0 refills | Status: DC

## 2018-03-02 MED ORDER — OXYCODONE-ACETAMINOPHEN 10-325 MG PO TABS
1.0000 | ORAL_TABLET | Freq: Four times a day (QID) | ORAL | 0 refills | Status: DC | PRN
Start: 2018-03-02 — End: 2018-03-21

## 2018-03-02 MED ORDER — ORPHENADRINE CITRATE 30 MG/ML IJ SOLN
60.0000 mg | Freq: Once | INTRAMUSCULAR | Status: AC
  Administered 2018-03-02: 60 mg via INTRAMUSCULAR

## 2018-03-02 MED ORDER — VITAMIN D3 125 MCG (5000 UT) PO CAPS
1.0000 | ORAL_CAPSULE | Freq: Every day | ORAL | 5 refills | Status: DC
Start: 2018-03-02 — End: 2018-07-21

## 2018-03-02 MED ORDER — GNP CALCIUM 1200 1200-1000 MG-UNIT PO CHEW
1200.0000 mg | CHEWABLE_TABLET | Freq: Every day | ORAL | 5 refills | Status: DC
Start: 2018-03-02 — End: 2018-07-21

## 2018-03-02 MED ORDER — ORPHENADRINE CITRATE 30 MG/ML IJ SOLN
INTRAMUSCULAR | Status: AC
  Filled 2018-03-02: qty 2

## 2018-03-02 MED ORDER — KETOROLAC TROMETHAMINE 60 MG/2ML IM SOLN
INTRAMUSCULAR | Status: AC
  Filled 2018-03-02: qty 2

## 2018-03-02 MED ORDER — MAGNESIUM 500 MG PO CAPS: 500.0000 mg | ORAL_CAPSULE | Freq: Two times a day (BID) | ORAL | 5 refills | Status: DC

## 2018-03-02 MED ORDER — TIZANIDINE HCL 4 MG PO TABS
4.0000 mg | ORAL_TABLET | Freq: Two times a day (BID) | ORAL | 0 refills | Status: DC | PRN
Start: 2018-03-02 — End: 2018-03-21

## 2018-03-02 NOTE — Patient Instructions (Addendum)
____________________________________________________________________________________________  Pain Scale  Introduction: The pain score used by this practice is the Verbal Numerical Rating Scale (VNRS-11). This is an 11-point scale. It is for adults and children 10 years or older. There are significant differences in how the pain score is reported, used, and applied. Forget everything you learned in the past and learn this scoring system.  General Information: The scale should reflect your current level of pain. Unless you are specifically asked for the level of your worst pain, or your average pain. If you are asked for one of these two, then it should be understood that it is over the past 24 hours.  Basic Activities of Daily Living (ADL): Personal hygiene, dressing, eating, transferring, and using restroom.  Instructions: Most patients tend to report their level of pain as a combination of two factors, their physical pain and their psychosocial pain. This last one is also known as "suffering" and it is reflection of how physical pain affects you socially and psychologically. From now on, report them separately. From this point on, when asked to report your pain level, report only your physical pain. Use the following table for reference.  Pain Clinic Pain Levels (0-5/10)  Pain Level Score  Description  No Pain 0   Mild pain 1 Nagging, annoying, but does not interfere with basic activities of daily living (ADL). Patients are able to eat, bathe, get dressed, toileting (being able to get on and off the toilet and perform personal hygiene functions), transfer (move in and out of bed or a chair without assistance), and maintain continence (able to control bladder and bowel functions). Blood pressure and heart rate are unaffected. A normal heart rate for a healthy adult ranges from 60 to 100 bpm (beats per minute).   Mild to moderate pain 2 Noticeable and distracting. Impossible to hide from other  people. More frequent flare-ups. Still possible to adapt and function close to normal. It can be very annoying and may have occasional stronger flare-ups. With discipline, patients may get used to it and adapt.   Moderate pain 3 Interferes significantly with activities of daily living (ADL). It becomes difficult to feed, bathe, get dressed, get on and off the toilet or to perform personal hygiene functions. Difficult to get in and out of bed or a chair without assistance. Very distracting. With effort, it can be ignored when deeply involved in activities.   Moderately severe pain 4 Impossible to ignore for more than a few minutes. With effort, patients may still be able to manage work or participate in some social activities. Very difficult to concentrate. Signs of autonomic nervous system discharge are evident: dilated pupils (mydriasis); mild sweating (diaphoresis); sleep interference. Heart rate becomes elevated (>115 bpm). Diastolic blood pressure (lower number) rises above 100 mmHg. Patients find relief in laying down and not moving.   Severe pain 5 Intense and extremely unpleasant. Associated with frowning face and frequent crying. Pain overwhelms the senses.  Ability to do any activity or maintain social relationships becomes significantly limited. Conversation becomes difficult. Pacing back and forth is common, as getting into a comfortable position is nearly impossible. Pain wakes you up from deep sleep. Physical signs will be obvious: pupillary dilation; increased sweating; goosebumps; brisk reflexes; cold, clammy hands and feet; nausea, vomiting or dry heaves; loss of appetite; significant sleep disturbance with inability to fall asleep or to remain asleep. When persistent, significant weight loss is observed due to the complete loss of appetite and sleep deprivation.  Blood   pressure and heart rate becomes significantly elevated. Caution: If elevated blood pressure triggers a pounding headache  associated with blurred vision, then the patient should immediately seek attention at an urgent or emergency care unit, as these may be signs of an impending stroke.    Emergency Department Pain Levels (6-10/10)  Emergency Room Pain 6 Severely limiting. Requires emergency care and should not be seen or managed at an outpatient pain management facility. Communication becomes difficult and requires great effort. Assistance to reach the emergency department may be required. Facial flushing and profuse sweating along with potentially dangerous increases in heart rate and blood pressure will be evident.   Distressing pain 7 Self-care is very difficult. Assistance is required to transport, or use restroom. Assistance to reach the emergency department will be required. Tasks requiring coordination, such as bathing and getting dressed become very difficult.   Disabling pain 8 Self-care is no longer possible. At this level, pain is disabling. The individual is unable to do even the most "basic" activities such as walking, eating, bathing, dressing, transferring to a bed, or toileting. Fine motor skills are lost. It is difficult to think clearly.   Incapacitating pain 9 Pain becomes incapacitating. Thought processing is no longer possible. Difficult to remember your own name. Control of movement and coordination are lost.   The worst pain imaginable 10 At this level, most patients pass out from pain. When this level is reached, collapse of the autonomic nervous system occurs, leading to a sudden drop in blood pressure and heart rate. This in turn results in a temporary and dramatic drop in blood flow to the brain, leading to a loss of consciousness. Fainting is one of the body's self defense mechanisms. Passing out puts the brain in a calmed state and causes it to shut down for a while, in order to begin the healing process.    Summary: 1. Refer to this scale when providing [place] with your pain level. 2. Be  accurate and careful when reporting your pain level. This will help with your care. 3. Over-reporting your pain level will lead to loss of credibility. 4. Even a level of 1/10 means that there is pain and will be treated at our facility. 5. High, inaccurate reporting will be documented as "Symptom Exaggeration", leading to loss of credibility and suspicions of possible secondary gains such as obtaining more narcotics, or wanting to appear disabled, for fraudulent reasons. 6. Only pain levels of 5 or below will be seen at our facility. 7. Pain levels of 6 and above will be sent to the Emergency Department and the appointment cancelled. ____________________________________________________________________________________________   ____________________________________________________________________________________________  Pain Prevention Technique  Definition:   A technique used to minimize the effects of an activity known to cause inflammation or swelling, which in turn leads to an increase in pain.  Purpose: To prevent swelling from occurring. It is based on the fact that it is easier to prevent swelling from happening than it is to get rid of it, once it occurs.  Contraindications: 1. Anyone with allergy or hypersensitivity to the recommended medications. 2. Anyone taking anticoagulants (Blood Thinners) (e.g., Coumadin, Warfarin, Plavix, etc.). 3. Patients in Renal Failure.  Technique: Before you undertake an activity known to cause pain, or a flare-up of your chronic pain, and before you experience any pain, do the following:  1. On a full stomach, take 4 (four) over the counter Ibuprofens 200mg  tablets (Motrin), for a total of 800 mg. 2. In addition, take over  the counter Magnesium 400 to 500 mg, before doing the activity.  3. Six (6) hours later, again on a full stomach, repeat the Ibuprofen. 4. That night, take a warm shower and stretch under the running warm water.  This  technique may be sufficient to abort the pain and discomfort before it happens. Keep in mind that it takes a lot less medication to prevent swelling than it takes to eliminate it once it occurs.  ____________________________________________________________________________________________   ____________________________________________________________________________________________  Muscle Spasms & Cramps  Cause:  The most common cause of muscle spasms and cramps is vitamin and/or electrolyte (calcium, potassium, sodium, etc.) deficiencies.  Possible triggers: Sweating - causes loss of electrolytes thru the skin. Steroids - causes loss of electrolytes thru the urine.  Treatment: 1. Gatorade (or any other electrolyte-replenishing drink) - Take 1, 8 oz glass with each meal (3 times a day). 2. OTC (over-the-counter) Magnesium 400 to 500 mg - Take 1 tablet twice a day (one with breakfast and one before bedtime). If you have kidney problems, talk to your primary care physician before taking any Magnesium. 3. Tonic Water with quinine - Take 1, 8 oz glass before bedtime.   ____________________________________________________________________________________________   ____________________________________________________________________________________________  Preparing for Procedure with Sedation  Instructions: . Oral Intake: Do not eat or drink anything for at least 8 hours prior to your procedure. . Transportation: Public transportation is not allowed. Bring an adult driver. The driver must be physically present in our waiting room before any procedure can be started. Marland Kitchen Physical Assistance: Bring an adult physically capable of assisting you, in the event you need help. This adult should keep you company at home for at least 6 hours after the procedure. . Blood Pressure Medicine: Take your blood pressure medicine with a sip of water the morning of the procedure. . Blood thinners: Notify our  staff if you are taking any blood thinners. Depending on which one you take, there will be specific instructions on how and when to stop it. . Diabetics on insulin: Notify the staff so that you can be scheduled 1st case in the morning. If your diabetes requires high dose insulin, take only  of your normal insulin dose the morning of the procedure and notify the staff that you have done so. . Preventing infections: Shower with an antibacterial soap the morning of your procedure. . Build-up your immune system: Take 1000 mg of Vitamin C with every meal (3 times a day) the day prior to your procedure. Marland Kitchen Antibiotics: Inform the staff if you have a condition or reason that requires you to take antibiotics before dental procedures. . Pregnancy: If you are pregnant, call and cancel the procedure. . Sickness: If you have a cold, fever, or any active infections, call and cancel the procedure. . Arrival: You must be in the facility at least 30 minutes prior to your scheduled procedure. . Children: Do not bring children with you. . Dress appropriately: Bring dark clothing that you would not mind if they get stained. . Valuables: Do not bring any jewelry or valuables.  Procedure appointments are reserved for interventional treatments only. Marland Kitchen No Prescription Refills. . No medication changes will be discussed during procedure appointments. . No disability issues will be discussed.  Reasons to call and reschedule or cancel your procedure: (Following these recommendations will minimize the risk of a serious complication.) . Surgeries: Avoid having procedures within 2 weeks of any surgery. (Avoid for 2 weeks before or after any surgery). . Flu Shots:  Avoid having procedures within 2 weeks of a flu shots or . (Avoid for 2 weeks before or after immunizations). . Barium: Avoid having a procedure within 7-10 days after having had a radiological study involving the use of radiological contrast. (Myelograms, Barium  swallow or enema study). . Heart attacks: Avoid any elective procedures or surgeries for the initial 6 months after a "Myocardial Infarction" (Heart Attack). . Blood thinners: It is imperative that you stop these medications before procedures. Let us know if you if you take any blood thinner.  . Infection: Avoid procedures during or within two weeks of an infection (including chest colds or gastrointestinal problems). Symptoms associated with infections include: Localized redness, fever, chills, night sweats or profuse sweating, burning sensation when voiding, cough, congestion, stuffiness, runny nose, sore throat, diarrhea, nausea, vomiting, cold or Flu symptoms, recent or current infections. It is specially important if the infection is over the area that we intend to treat. Marland Kitchen Heart and lung problems: Symptoms that may suggest an active cardiopulmonary problem include: cough, chest pain, breathing difficulties or shortness of breath, dizziness, ankle swelling, uncontrolled high or unusually low blood pressure, and/or palpitations. If you are experiencing any of these symptoms, cancel your procedure and contact your primary care physician for an evaluation.  Remember:  Regular Business hours are:  Monday to Thursday 8:00 AM to 4:00 PM  Provider's Schedule: Delano Metz, MD:  Procedure days: Tuesday and Thursday 7:30 AM to 4:00 PM  Edward Jolly, MD:  Procedure days: Monday and Wednesday 7:30 AM to 4:00 PM ____________________________________________________________________________________________   ____________________________________________________________________________________________  Medication Rules  Applies to: All patients receiving prescriptions (written or electronic).  Pharmacy of record: Pharmacy where electronic prescriptions will be sent. If written prescriptions are taken to a different pharmacy, please inform the nursing staff. The pharmacy listed in the electronic  medical record should be the one where you would like electronic prescriptions to be sent.  Prescription refills: Only during scheduled appointments. Applies to both, written and electronic prescriptions.  NOTE: The following applies primarily to controlled substances (Opioid* Pain Medications).   Patient's responsibilities: 1. Pain Pills: Bring all pain pills to every appointment (except for procedure appointments). 2. Pill Bottles: Bring pills in original pharmacy bottle. Always bring newest bottle. Bring bottle, even if empty. 3. Medication refills: You are responsible for knowing and keeping track of what medications you need refilled. The day before your appointment, write a list of all prescriptions that need to be refilled. Bring that list to your appointment and give it to the admitting nurse. Prescriptions will be written only during appointments. If you forget a medication, it will not be "Called in", "Faxed", or "electronically sent". You will need to get another appointment to get these prescribed. 4. Prescription Accuracy: You are responsible for carefully inspecting your prescriptions before leaving our office. Have the discharge nurse carefully go over each prescription with you, before taking them home. Make sure that your name is accurately spelled, that your address is correct. Check the name and dose of your medication to make sure it is accurate. Check the number of pills, and the written instructions to make sure they are clear and accurate. Make sure that you are given enough medication to last until your next medication refill appointment. 5. Taking Medication: Take medication as prescribed. Never take more pills than instructed. Never take medication more frequently than prescribed. Taking less pills or less frequently is permitted and encouraged, when it comes to controlled substances (written prescriptions).  6. Inform other Doctors: Always inform, all of your healthcare  providers, of all the medications you take. 7. Pain Medication from other Providers: You are not allowed to accept any additional pain medication from any other Doctor or Healthcare provider. There are two exceptions to this rule. (see below) In the event that you require additional pain medication, you are responsible for notifying us, as stated below. 8. Medication Agreement: You are responsible for carefully reading and following our Medication Agreement. This must be signed before receiving any prescriptions from our practice. Safely store a copy of your signed Agreement. Violations to the Agreement will result in no further prescriptions. (Additional copies of our Medication Agreement are available upon request.) 9. Laws, Rules, & Regulations: All patients are expected to follow all [address] and [employer], Rules, [place] [employer] [place] [employer]. Ignorance of the Laws does not constitute a valid excuse. The use of any illegal substances is prohibited. 10. Adopted CDC guidelines & recommendations: Target dosing levels will be at or below 60 MME/day. Use of benzodiazepines** is not recommended.  Exceptions: There are only two exceptions to the rule of not receiving pain medications from other Healthcare Providers. 1. Exception #1 (Emergencies): In the event of an emergency (i.e.: accident requiring emergency care), you are allowed to receive additional pain medication. However, you are responsible for: As soon as you are able, call our office [phone], at any time of the day or night, and leave a message stating your name, the date and nature of the emergency, and the name and dose of the medication prescribed. In the event that your call is answered by a member of our staff, make sure to document and save the date, time, and the name of the person that took your information.  2. Exception #2 (Planned Surgery): In the event that you are scheduled by another doctor or dentist to have any type of surgery or  procedure, you are allowed (for a period no longer than 30 days), to receive additional pain medication, for the acute post-op pain. However, in this case, you are responsible for picking up a copy of our "Post-op Pain Management for Surgeons" handout, and giving it to your surgeon or dentist. This document is available at our office, and does not require an appointment to obtain it. Simply go to our office during business hours (Monday-Thursday from 8:00 AM to 4:00 PM) (Friday 8:00 AM to 12:00 Noon) or if you have a scheduled appointment with [place], prior to your surgery, and ask for it by name. In addition, you will need to provide [place] with your name, name of your surgeon, type of surgery, and date of procedure or surgery.  *Opioid medications include: morphine, codeine, oxycodone, oxymorphone, hydrocodone, hydromorphone, meperidine, tramadol, tapentadol, buprenorphine, fentanyl, methadone. **Benzodiazepine medications include: diazepam (Valium), alprazolam (Xanax), clonazepam (Klonopine), lorazepam (Ativan), clorazepate (Tranxene), chlordiazepoxide (Librium), estazolam (Prosom), oxazepam (Serax), temazepam (Restoril), triazolam (Halcion) (Last updated: 09/02/2017) ____________________________________________________________________________________________   ____________________________________________________________________________________________  Medication Recommendations and Reminders  Applies to: All patients receiving prescriptions (written and/or electronic).  Medication Rules & Regulations: These rules and regulations exist for your safety and that of others. They are not flexible and neither are we. Dismissing or ignoring them will be considered "non-compliance" with medication therapy, resulting in complete and irreversible termination of such therapy. (See document titled "Medication Rules" for more details.) In all conscience, because of safety reasons, we cannot continue providing a  therapy where the patient does not follow instructions.  Pharmacy of record:  Definition: This is the pharmacy where your electronic prescriptions will be sent.   We do not endorse any particular pharmacy.  You are not restricted in your choice of pharmacy.  The pharmacy listed in the electronic medical record should be the one where you want electronic prescriptions to be sent.  If you choose to change pharmacy, simply notify our nursing staff of your choice of new pharmacy.  Recommendations:  Keep all of your pain medications in a safe place, under lock and key, even if you live alone.   After you fill your prescription, take 1 week's worth of pills and put them away in a safe place. You should keep a separate, properly labeled bottle for this purpose. The remainder should be kept in the original bottle. Use this as your primary supply, until it runs out. Once it's gone, then you know that you have 1 week's worth of medicine, and it is time to come in for a prescription refill. If you do this correctly, it is unlikely that you will ever run out of medicine.  To make sure that the above recommendation works, it is very important that you make sure your medication refill appointments are scheduled at least 1 week before you run out of medicine. To do this in an effective manner, make sure that you do not leave the office without scheduling your next medication management appointment. Always ask the nursing staff to show you in your prescription , when your medication will be running out. Then arrange for the receptionist to get you a return appointment, at least 7 days before you run out of medicine. Do not wait until you have 1 or 2 pills left, to come in. This is very poor planning and does not take into consideration that we may need to cancel appointments due to bad weather, sickness, or emergencies affecting our staff.  "Partial Fill": If for any reason your pharmacy does not have enough  pills/tablets to completely fill or refill your prescription, do not allow for a "partial fill". You will need a separate prescription to fill the remaining amount, which we will not provide. If the reason for the partial fill is your insurance, you will need to talk to the pharmacist about payment alternatives for the remaining tablets, but again, do not accept a partial fill.  Prescription refills and/or changes in medication(s):   Prescription refills, and/or changes in dose or medication, will be conducted only during scheduled medication management appointments. (Applies to both, written and electronic prescriptions.)  No refills on procedure days. No medication will be changed or started on procedure days. No changes, adjustments, and/or refills will be conducted on a procedure day. Doing so will interfere with the diagnostic portion of the procedure.  No phone refills. No medications will be "called into the pharmacy".  No Fax refills.  No weekend refills.  No Holliday refills.  No after hours refills.  Remember:  Business hours are:  Monday to Thursday 8:00 AM to 4:00 PM Provider's Schedule: Thad Rangerrystal King, NP - Appointments are:  Medication management: Monday to Thursday 8:00 AM to 4:00 PM Delano MetzFrancisco Constanza Mincy, MD - Appointments are:  Medication management: Monday and Wednesday 8:00 AM to 4:00 PM Procedure day: Tuesday and Thursday 7:30 AM to 4:00 PM Edward JollyBilal Lateef, MD - Appointments are:  Medication management: Tuesday and Thursday 8:00 AM to 4:00 PM Procedure day: Monday and Wednesday 7:30 AM to 4:00 PM (Last update: 09/02/2017) ____________________________________________________________________________________________   ____________________________________________________________________________________________  CANNABIDIOL (AKA: CBD  Oil or Pills)  Applies to: All patients receiving prescriptions of controlled substances (written and/or electronic).  General Information:  Cannabidiol (CBD) was discovered in 101. It is one of some 113 identified cannabinoids in cannabis (Marijuana) plants, accounting for up to 40% of the plant's extract. As of 2018, preliminary clinical research on cannabidiol included studies of anxiety, cognition, movement disorders, and pain.  Cannabidiol is consummed in multiple ways, including inhalation of cannabis smoke or vapor, as an aerosol spray into the cheek, and by mouth. It may be supplied as CBD oil containing CBD as the active ingredient (no added tetrahydrocannabinol (THC) or terpenes), a full-plant CBD-dominant hemp extract oil, capsules, dried cannabis, or as a liquid solution. CBD is thought not have the same psychoactivity as THC, and may affect the actions of THC. Studies suggest that CBD may interact with different biological targets, including cannabinoid receptors and other neurotransmitter receptors. As of 2018 the mechanism of action for its biological effects has not been determined.  In the [place], cannabidiol has a limited approval by the Food and Drug Administration (FDA) for treatment of only two types of epilepsy disorders. The side effects of long-term use of the drug include somnolence, decreased appetite, diarrhea, fatigue, malaise, weakness, sleeping problems, and others.  CBD remains a Schedule I drug prohibited for any use.  Legality: Some manufacturers ship CBD products nationally, an illegal action which the FDA has not enforced in 2018, with CBD remaining the subject of an FDA investigational new drug evaluation, and is not considered legal as a dietary supplement or food ingredient as of December 2018. Federal illegality has made it difficult historically to conduct research on CBD. CBD is openly sold in head shops and health food stores in some states where such sales have not been explicitly legalized.  Warning: Because it is not FDA approved for general use or treatment of pain, it is not required to  undergo the same manufacturing controls as prescription drugs.  This means that the available cannabidiol (CBD) may be contaminated with THC.  If this is the case, it will trigger a positive urine drug screen (UDS) test for cannabinoids (Marijuana).  Because a positive UDS for illicit substances is a violation of our medication agreement, your opioid analgesics (pain medicine) may be permanently discontinued. (Last update: 09/23/2017) ____________________________________________________________________________________________   ____________________________________________________________________________________________  Preparing for Procedure with Sedation  Instructions: . Oral Intake: Do not eat or drink anything for at least 8 hours prior to your procedure. . Transportation: Public transportation is not allowed. Bring an adult driver. The driver must be physically present in our waiting room before any procedure can be started. [facility] Physical Assistance: Bring an adult physically capable of assisting you, in the event you need help. This adult should keep you company at home for at least 6 hours after the procedure. . Blood Pressure Medicine: Take your blood pressure medicine with a sip of water the morning of the procedure. . Blood thinners: Notify our staff if you are taking any blood thinners. Depending on which one you take, there will be specific instructions on how and when to stop it. . Diabetics on insulin: Notify the staff so that you can be scheduled 1st case in the morning. If your diabetes requires high dose insulin, take only  of your normal insulin dose the morning of the procedure and notify the staff that you have done so. . Preventing infections: Shower with an antibacterial soap the morning of your procedure. . Build-up your  immune system: Take 1000 mg of Vitamin C with every meal (3 times a day) the day prior to your procedure. [facility] Kitchen Antibiotics: Inform the staff if you have a  condition or reason that requires you to take antibiotics before dental procedures. . Pregnancy: If you are pregnant, call and cancel the procedure. . Sickness: If you have a cold, fever, or any active infections, call and cancel the procedure. . Arrival: You must be in the facility at least 30 minutes prior to your scheduled procedure. . Children: Do not bring children with you. . Dress appropriately: Bring dark clothing that you would not mind if they get stained. . Valuables: Do not bring any jewelry or valuables.  Procedure appointments are reserved for interventional treatments only. [facility] Kitchen No Prescription Refills. . No medication changes will be discussed during procedure appointments. . No disability issues will be discussed.  Reasons to call and reschedule or cancel your procedure: (Following these recommendations will minimize the risk of a serious complication.) . Surgeries: Avoid having procedures within 2 weeks of any surgery. (Avoid for 2 weeks before or after any surgery). . Flu Shots: Avoid having procedures within 2 weeks of a flu shots or . (Avoid for 2 weeks before or after immunizations). . Barium: Avoid having a procedure within 7-10 days after having had a radiological study involving the use of radiological contrast. (Myelograms, Barium swallow or enema study). . Heart attacks: Avoid any elective procedures or surgeries for the initial 6 months after a "Myocardial Infarction" (Heart Attack). . Blood thinners: It is imperative that you stop these medications before procedures. Let us know if you if you take any blood thinner.  . Infection: Avoid procedures during or within two weeks of an infection (including chest colds or gastrointestinal problems). Symptoms associated with infections include: Localized redness, fever, chills, night sweats or profuse sweating, burning sensation when voiding, cough, congestion, stuffiness, runny nose, sore throat, diarrhea, nausea, vomiting, cold  or Flu symptoms, recent or current infections. It is specially important if the infection is over the area that we intend to treat. [facility] Heart and lung problems: Symptoms that may suggest an active cardiopulmonary problem include: cough, chest pain, breathing difficulties or shortness of breath, dizziness, ankle swelling, uncontrolled high or unusually low blood pressure, and/or palpitations. If you are experiencing any of these symptoms, cancel your procedure and contact your primary care physician for an evaluation.  Remember:  Regular Business hours are:  Monday to Thursday 8:00 AM to 4:00 PM  Provider's Schedule: Delano Metz, MD:  Procedure days: Tuesday and Thursday 7:30 AM to 4:00 PM  Edward Jolly, MD:  Procedure days: Monday and Wednesday 7:30 AM to 4:00 PM ____________________________________________________________________________________________   Epidural Steroid Injection An epidural steroid injection is a shot of steroid medicine and numbing medicine that is given into the space between the spinal cord and the bones in your back (epidural space). The shot helps relieve pain caused by an irritated or swollen nerve root. The amount of pain relief you get from the injection depends on what is causing the nerve to be swollen and irritated, and how long your pain lasts. You are more likely to benefit from this injection if your pain is strong and comes on suddenly rather than if you have had pain for a long time. Tell a health care provider about:  Any allergies you have.  All medicines you are taking, including vitamins, herbs, eye drops, creams, and over-the-counter medicines.  Any problems you or family members  have had with anesthetic medicines.  Any blood disorders you have.  Any surgeries you have had.  Any medical conditions you have.  Whether you are pregnant or may be pregnant. What are the risks? Generally, this is a safe procedure. However, problems may occur,  including:  Headache.  Bleeding.  Infection.  Allergic reaction to medicines.  Damage to your nerves.  What happens before the procedure? Staying hydrated Follow instructions from your health care provider about hydration, which may include:  Up to 2 hours before the procedure - you may continue to drink clear liquids, such as water, clear fruit juice, black coffee, and plain tea.  Eating and drinking restrictions Follow instructions from your health care provider about eating and drinking, which may include:  8 hours before the procedure - stop eating heavy meals or foods such as meat, fried foods, or fatty foods.  6 hours before the procedure - stop eating light meals or foods, such as toast or cereal.  6 hours before the procedure - stop drinking milk or drinks that contain milk.  2 hours before the procedure - stop drinking clear liquids.  Medicine  You may be given medicines to lower anxiety.  Ask your health care provider about: ? Changing or stopping your regular medicines. This is especially important if you are taking diabetes medicines or blood thinners. ? Taking medicines such as aspirin and ibuprofen. These medicines can thin your blood. Do not take these medicines before your procedure if your health care provider instructs you not to. General instructions  Plan to have someone take you home from the hospital or clinic. What happens during the procedure?  You may receive a medicine to help you relax (sedative).  You will be asked to lie on your abdomen.  The injection site will be cleaned.  A numbing medicine (local anesthetic) will be used to numb the injection site.  A needle will be inserted through your skin into the epidural space. You may feel some discomfort when this happens. An X-ray machine will be used to make sure the needle is put as close as possible to the affected nerve.  A steroid medicine and a local anesthetic will be injected into  the epidural space.  The needle will be removed.  A bandage (dressing) will be put over the injection site. What happens after the procedure?  Your blood pressure, heart rate, breathing rate, and blood oxygen level will be monitored until the medicines you were given have worn off.  Your arm or leg may feel weak or numb for a few hours.  The injection site may feel sore.  Do not drive for 24 hours if you received a sedative. This information is not intended to replace advice given to you by your health care provider. Make sure you discuss any questions you have with your health care provider. Document Released: 09/29/2007 Document Revised: 12/04/2015 Document Reviewed: 10/08/2015 [employer]vier Interactive Patient Education  [employer].

## 2018-03-08 ENCOUNTER — Ambulatory Visit (HOSPITAL_BASED_OUTPATIENT_CLINIC_OR_DEPARTMENT_OTHER): Payer: BLUE CROSS/BLUE SHIELD | Admitting: Pain Medicine

## 2018-03-08 ENCOUNTER — Ambulatory Visit
Admission: RE | Admit: 2018-03-08 | Discharge: 2018-03-08 | Disposition: A | Discharge status: Home / Self Care | Payer: BLUE CROSS/BLUE SHIELD | Source: Ambulatory Visit | Attending: Pain Medicine | Admitting: Pain Medicine

## 2018-03-08 ENCOUNTER — Other Ambulatory Visit: Payer: Self-pay

## 2018-03-08 ENCOUNTER — Encounter: Payer: Self-pay | Admitting: Pain Medicine

## 2018-03-08 VITALS — BP 112/67 | HR 96 | Temp 98.4°F | Resp 16 | Ht 70.0 in | Wt 181.0 lb

## 2018-03-08 DIAGNOSIS — M960 Pseudarthrosis after fusion or arthrodesis: Secondary | ICD-10-CM | POA: Diagnosis not present

## 2018-03-08 DIAGNOSIS — M5117 Intervertebral disc disorders with radiculopathy, lumbosacral region: Secondary | ICD-10-CM | POA: Insufficient documentation

## 2018-03-08 DIAGNOSIS — Z79891 Long term (current) use of opiate analgesic: Secondary | ICD-10-CM | POA: Diagnosis not present

## 2018-03-08 DIAGNOSIS — M5137 Other intervertebral disc degeneration, lumbosacral region: Secondary | ICD-10-CM | POA: Diagnosis not present

## 2018-03-08 DIAGNOSIS — G9619 Other disorders of meninges, not elsewhere classified: Secondary | ICD-10-CM

## 2018-03-08 DIAGNOSIS — F419 Anxiety disorder, unspecified: Secondary | ICD-10-CM | POA: Insufficient documentation

## 2018-03-08 DIAGNOSIS — Z885 Allergy status to narcotic agent status: Secondary | ICD-10-CM | POA: Insufficient documentation

## 2018-03-08 DIAGNOSIS — M79605 Pain in left leg: Secondary | ICD-10-CM | POA: Insufficient documentation

## 2018-03-08 DIAGNOSIS — M79604 Pain in right leg: Secondary | ICD-10-CM

## 2018-03-08 DIAGNOSIS — G8929 Other chronic pain: Secondary | ICD-10-CM | POA: Insufficient documentation

## 2018-03-08 DIAGNOSIS — M961 Postlaminectomy syndrome, not elsewhere classified: Secondary | ICD-10-CM | POA: Diagnosis not present

## 2018-03-08 DIAGNOSIS — M5442 Lumbago with sciatica, left side: Secondary | ICD-10-CM

## 2018-03-08 DIAGNOSIS — Z880 Allergy status to penicillin: Secondary | ICD-10-CM | POA: Diagnosis not present

## 2018-03-08 DIAGNOSIS — Z79899 Other long term (current) drug therapy: Secondary | ICD-10-CM | POA: Diagnosis not present

## 2018-03-08 DIAGNOSIS — M5441 Lumbago with sciatica, right side: Secondary | ICD-10-CM

## 2018-03-08 DIAGNOSIS — M545 Low back pain: Secondary | ICD-10-CM | POA: Diagnosis present

## 2018-03-08 DIAGNOSIS — G96198 Other disorders of meninges, not elsewhere classified: Secondary | ICD-10-CM

## 2018-03-08 MED ORDER — FENTANYL CITRATE (PF) 100 MCG/2ML IJ SOLN
25.0000 ug | INTRAMUSCULAR | Status: DC | PRN
  Administered 2018-03-08: 100 ug via INTRAVENOUS
  Filled 2018-03-08: qty 2

## 2018-03-08 MED ORDER — TRIAMCINOLONE ACETONIDE 40 MG/ML IJ SUSP
40.0000 mg | Freq: Once | INTRAMUSCULAR | Status: AC
  Administered 2018-03-08: 40 mg
  Filled 2018-03-08: qty 1

## 2018-03-08 MED ORDER — SODIUM CHLORIDE 0.9% FLUSH
2.0000 mL | Freq: Once | INTRAVENOUS | Status: AC
  Administered 2018-03-08: 10 mL

## 2018-03-08 MED ORDER — ROPIVACAINE HCL 2 MG/ML IJ SOLN
2.0000 mL | Freq: Once | INTRAMUSCULAR | Status: AC
  Administered 2018-03-08: 2 mL via EPIDURAL
  Filled 2018-03-08: qty 10

## 2018-03-08 MED ORDER — LACTATED RINGERS IV SOLN
1000.0000 mL | Freq: Once | INTRAVENOUS | Status: AC
  Administered 2018-03-08: 1000 mL via INTRAVENOUS

## 2018-03-08 MED ORDER — LIDOCAINE HCL 2 % IJ SOLN
20.0000 mL | Freq: Once | INTRAMUSCULAR | Status: AC
  Administered 2018-03-08: 400 mg
  Filled 2018-03-08: qty 40

## 2018-03-08 MED ORDER — MIDAZOLAM HCL 5 MG/5ML IJ SOLN
1.0000 mg | INTRAMUSCULAR | Status: DC | PRN
  Administered 2018-03-08: 3 mg via INTRAVENOUS
  Filled 2018-03-08: qty 5

## 2018-03-08 MED ORDER — IOPAMIDOL (ISOVUE-M 200) INJECTION 41%
10.0000 mL | Freq: Once | INTRAMUSCULAR | Status: AC
  Administered 2018-03-08: 10 mL via EPIDURAL
  Filled 2018-03-08: qty 10

## 2018-03-08 NOTE — Progress Notes (Signed)
Safety precautions to be maintained throughout the outpatient stay will include: orient to surroundings, keep bed in low position, maintain call bell within reach at all times, provide assistance with transfer out of bed and ambulation.  

## 2018-03-08 NOTE — Patient Instructions (Signed)

## 2018-03-08 NOTE — Progress Notes (Addendum)
Patient's Name: Derek [name]  MRN: [id number]  Referring Provider: Sherron Monday, MD  DOB: [birth year]  PCP: Sherron Monday, MD  DOS: 03/08/2018  Note by: Oswaldo Done, MD  Service setting: Ambulatory outpatient  Specialty: Interventional Pain Management  Patient type: Established  Location: [facility] (AMB) Pain Management Facility  Visit type: Interventional Procedure   Primary Reason for Visit: Interventional Pain Management Treatment. CC: Back Pain (lower)  Procedure:          Anesthesia, Analgesia, Anxiolysis:  Type: Diagnostic Epidural Steroid Injection + Diagnostic Epidurogram #1  Region: Caudal Level: Sacrococcygeal   Laterality: Midline       Type: Moderate (Conscious) Sedation combined with Local Anesthesia Indication(s): Analgesia and Anxiety Route: Intravenous (IV) IV Access: Secured Sedation: Meaningful verbal contact was maintained at all times during the procedure  Local Anesthetic: Lidocaine 1-2%   Indications: 1. DDD (degenerative disc disease), lumbosacral   2. Failed back surgical syndrome (x3)   3. Epidural fibrosis   4. Lumbar postlaminectomy syndrome   5. L5-S1 pseudoarthrosis   6. Chronic low back pain (Primary Area of Pain) (Bilateral) (L>R) w/ sciatica (Bilateral)   7. Chronic lower extremity pain (Secondary Area of Pain) (Bilateral) (L>R)    Pain Score: Pre-procedure: 1 /10 Post-procedure: 0-No pain/10  Pre-op Assessment:  [name] is a 37 y.o. (year old), male patient, seen today for interventional treatment. He  has a past surgical history that includes Back surgery; HAND REIMPLANTED; and Lumbar fusion (11/14). [name] has a current medication list which includes the following prescription(s): gnp calcium 1200, vitamin d3, cyclobenzaprine, ergocalciferol, magnesium, oxycodone-acetaminophen, and tizanidine, and the following Facility-Administered Medications: fentanyl and midazolam. His primarily concern today is the Back Pain (lower)  Initial  Vital Signs:  Pulse/HCG Rate: 89ECG Heart Rate: (!) 104 Temp: 98.6 F (37 C) Resp: 16 BP: 116/70 SpO2: 98 %  BMI: Estimated body mass index is 25.97 kg/m as calculated from the following:   Height as of this encounter: 5\' 10"  (1.778 m).   Weight as of this encounter: 181 lb (82.1 kg).  Risk Assessment: Allergies: Reviewed. He is allergic to amoxicillin and vicodin [hydrocodone-acetaminophen].  Allergy Precautions: None required Coagulopathies: Reviewed. None identified.  Blood-thinner therapy: None at this time Active Infection(s): Reviewed. None identified. [name] is afebrile  Site Confirmation: Mr. Kazmi was asked to confirm the procedure and laterality before marking the site Procedure checklist: Completed Consent: Before the procedure and under the influence of no sedative(s), amnesic(s), or anxiolytics, the patient was informed of the treatment options, risks and possible complications. To fulfill our ethical and legal obligations, as recommended by the American Medical Association's Code of Ethics, I have informed the patient of my clinical impression; the nature and purpose of the treatment or procedure; the risks, benefits, and possible complications of the intervention; the alternatives, including doing nothing; the risk(s) and benefit(s) of the alternative treatment(s) or procedure(s); and the risk(s) and benefit(s) of doing nothing. The patient was provided information about the general risks and possible complications associated with the procedure. These may include, but are not limited to: failure to achieve desired goals, infection, bleeding, organ or nerve damage, allergic reactions, paralysis, and death. In addition, the patient was informed of those risks and complications associated to Spine-related procedures, such as failure to decrease pain; infection (i.e.: Meningitis, epidural or intraspinal abscess); bleeding (i.e.: epidural hematoma, subarachnoid hemorrhage, or any  other type of intraspinal or peri-dural bleeding); organ or nerve damage (i.e.: Any type  of peripheral nerve, nerve root, or spinal cord injury) with subsequent damage to sensory, motor, and/or autonomic systems, resulting in permanent pain, numbness, and/or weakness of one or several areas of the body; allergic reactions; (i.e.: anaphylactic reaction); and/or death. Furthermore, the patient was informed of those risks and complications associated with the medications. These include, but are not limited to: allergic reactions (i.e.: anaphylactic or anaphylactoid reaction(s)); adrenal axis suppression; blood sugar elevation that in diabetics may result in ketoacidosis or comma; water retention that in patients with history of congestive heart failure may result in shortness of breath, pulmonary edema, and decompensation with resultant heart failure; weight gain; swelling or edema; medication-induced neural toxicity; particulate matter embolism and blood vessel occlusion with resultant organ, and/or nervous system infarction; and/or aseptic necrosis of one or more joints. Finally, the patient was informed that Medicine is not an exact science; therefore, there is also the possibility of unforeseen or unpredictable risks and/or possible complications that may result in a catastrophic outcome. The patient indicated having understood very clearly. We have given the patient no guarantees and we have made no promises. Enough time was given to the patient to ask questions, all of which were answered to the patient's satisfaction. Mr. Ben has indicated that he wanted to continue with the procedure. Attestation: I, the ordering provider, attest that I have discussed with the patient the benefits, risks, side-effects, alternatives, likelihood of achieving goals, and potential problems during recovery for the procedure that I have provided informed consent. Date  Time: 03/08/2018  9:43 AM  Pre-Procedure Preparation:   Monitoring: As per clinic protocol. Respiration, ETCO2, SpO2, BP, heart rate and rhythm monitor placed and checked for adequate function Safety Precautions: Patient was assessed for positional comfort and pressure points before starting the procedure. Time-out: I initiated and conducted the "Time-out" before starting the procedure, as per protocol. The patient was asked to participate by confirming the accuracy of the "Time Out" information. Verification of the correct person, site, and procedure were performed and confirmed by me, the nursing staff, and the patient. "Time-out" conducted as per Joint Commission's Universal Protocol (UP.01.01.01). Time: 1103  Description of Procedure:          Position: Prone Target Area: Caudal Epidural Canal. Approach: Midline approach. Area Prepped: Entire Posterior Sacrococcygeal Region Prepping solution: ChloraPrep (2% chlorhexidine gluconate and 70% isopropyl alcohol) Safety Precautions: Aspiration looking for blood return was conducted prior to all injections. At no point did we inject any substances, as a needle was being advanced. No attempts were made at seeking any paresthesias. Safe injection practices and needle disposal techniques used. Medications properly checked for expiration dates. SDV (single dose vial) medications used. Description of the Procedure: Protocol guidelines were followed. The patient was placed in position over the fluoroscopy table. The target area was identified and the area prepped in the usual manner. Skin & deeper tissues infiltrated with local anesthetic. Appropriate amount of time allowed to pass for local anesthetics to take effect. The procedure needles were then advanced to the target area. Proper needle placement secured. Negative aspiration confirmed. Solution injected in intermittent fashion, asking for systemic symptoms every 0.5cc of injectate. The needles were then removed and the area cleansed, making sure to leave some  of the prepping solution back to take advantage of its long term bactericidal properties.  Vitals:   03/08/18 1114 03/08/18 1124 03/08/18 1134 03/08/18 1143  BP: 104/87 123/69 121/79 112/67  Pulse:  84 90 96  Resp: 13 10 18  16  Temp:  98.4 F (36.9 C)    TempSrc:  Oral    SpO2: 97% 98% 99% 99%  Weight:      Height:        Start Time: 1103 hrs. End Time: 1114 hrs. Materials:  Needle(s) Type: Epidural needle Gauge: 17G Length: 3.5-in Medication(s): Please see orders for medications and dosing details.  Imaging Guidance (Spinal):          Type of Imaging Technique: Fluoroscopy Guidance (Spinal) Indication(s): Assistance in needle guidance and placement for procedures requiring needle placement in or near specific anatomical locations not easily accessible without such assistance. Exposure Time: Please see nurses notes. Contrast: Before injecting any contrast, we confirmed that the patient did not have an allergy to iodine, shellfish, or radiological contrast. Once satisfactory needle placement was completed at the desired level, radiological contrast was injected. Contrast injected under live fluoroscopy. No contrast complications. See chart for type and volume of contrast used. Fluoroscopic Guidance: I was personally present during the use of fluoroscopy. "Tunnel Vision Technique" used to obtain the best possible view of the target area. Parallax error corrected before commencing the procedure. "Direction-depth-direction" technique used to introduce the needle under continuous pulsed fluoroscopy. Once target was reached, antero-posterior, oblique, and lateral fluoroscopic projection used confirm needle placement in all planes. Images permanently stored in EMR.      Interpretation: I personally interpreted the imaging intraoperatively. Adequate needle placement confirmed in multiple planes. Appropriate spread of contrast into desired area was observed. No evidence of afferent or efferent  intravascular uptake. No intrathecal or subarachnoid spread observed. Permanent images saved into the patient's record.  Diagnostic Epidurogram:  Contrast: Before injecting any contrast, we confirmed that the patient did not have an allergy to iodine, shellfish, or radiological contrast. For accuracy purposes, contrast was injected under live fluoroscopy. Study personally interpreted intraoparatively. Type: Non-ionic, water soluble, hypoallergenic, myelogram-compatible, radiological contrast used. Please see orders and nurses note for specific choice of contrast. Volume: Please see nurses note for injected volume.  Observations:  Spinal Alignment: Maintained       Vertebral body: Intact Lamina: Surgical changes observed. Laminectomy observed at the L5 level. Disc: Loss of disc hight Facet: Arthritic changes        Hardware: Pedicle screw(s): At L3, L4, L5, and sacral, bilaterally. The patient also has bilateral, posterior rods. And to intravertebral disc spacers between L3-4 and L4-5.  Spread: Abnormal contrast spread. See below. Anterior: Minimal anterior flow observed Posterior: Defect identified Superior (cephalad): Defect identified blocking the flow of contrast at the level of the L4-5, bilaterally. Inferior (caudad): Flow limited to: The area of the S1, bilaterally with S2 and S3 better visualized on the right side. On the left side it is difficult to visualize S2 and S3. Right lateral: Flow limited to: The level of the L5 with no visualization of the L4. Left lateral: Flow limited to: The level of the L5 with no visualization of the L4. A defect seems to be present around the L5. Plica medialis dorsalis: more pronounced on the right side, but appears to be pushed towards the left. Nerve root(s): ill-defined L5 nerve roots, bilaterally.The one on the left side seems to be less prominent than the one on the right. Epidural extravasation: None observed Intrathecal: No intrathecal spread  identified Subarachnoid: No subarachnoid spread pattern observed Vascular: No evidence of afferent or efferent intravascular uptake  Impression: Technically successful epidurogram. Observed changes are compatible with epidural fibrosis, as described above Note: Hard copies saved  to EMR.  Antibiotic Prophylaxis:   Anti-infectives (From admission, onward)   None     Indication(s): None identified  Post-operative Assessment:  Post-procedure Vital Signs:  Pulse/HCG Rate: 9682 Temp: 98.4 F (36.9 C) Resp: 16 BP: 112/67 SpO2: 99 %  EBL: None  Complications: No immediate post-treatment complications observed by team, or reported by patient.  Note: The patient tolerated the entire procedure well. A repeat set of vitals were taken after the procedure and the patient was kept under observation following institutional policy, for this type of procedure. Post-procedural neurological assessment was performed, showing return to baseline, prior to discharge. The patient was provided with post-procedure discharge instructions, including a section on how to identify potential problems. Should any problems arise concerning this procedure, the patient was given instructions to immediately contact us, at any time, without hesitation. In any case, we plan to contact the patient by telephone for a follow-up status report regarding this interventional procedure.  Comments:  No additional relevant information.  Plan of Care    Imaging Orders     DG C-Arm 1-60 Min-No Report  Procedure Orders     Caudal Epidural Injection  Medications ordered for procedure: Meds ordered this encounter  Medications  . iopamidol (ISOVUE-M) 41 % intrathecal injection 10 mL    Must be Myelogram-compatible. If not available, you may substitute with a water-soluble, non-ionic, hypoallergenic, myelogram-compatible radiological contrast medium.  Marland Kitchen lidocaine (XYLOCAINE) 2 % (with pres) injection 400 mg  . midazolam  (VERSED) 5 MG/5ML injection 1-2 mg    Make sure Flumazenil is available in the pyxis when using this medication. If oversedation occurs, administer 0.2 mg IV over 15 sec. If after 45 sec no response, administer 0.2 mg again over 1 min; may repeat at 1 min intervals; not to exceed 4 doses (1 mg)  . fentaNYL (SUBLIMAZE) injection 25-50 mcg    Make sure Narcan is available in the pyxis when using this medication. In the event of respiratory depression (RR< 8/min): Titrate NARCAN (naloxone) in increments of 0.1 to 0.2 mg IV at 2-3 minute intervals, until desired degree of reversal.  . lactated ringers infusion 1,000 mL  . sodium chloride flush (NS) 0.9 % injection 2 mL  . ropivacaine (PF) 2 mg/mL (0.2%) (NAROPIN) injection 2 mL  . triamcinolone acetonide (KENALOG-40) injection 40 mg   Medications administered: We administered iopamidol, lidocaine, midazolam, fentaNYL, lactated ringers, sodium chloride flush, ropivacaine (PF) 2 mg/mL (0.2%), and triamcinolone acetonide.  See the medical record for exact dosing, route, and time of administration.  New Prescriptions   No medications on file   Disposition: Discharge home  Discharge Date & Time: 03/08/2018; 1146 hrs.   Physician-requested Follow-up: Return for post-procedure eval (2 wks), w/ [name].  Future Appointments  Date Time Provider Department Center  03/21/2018  1:30 PM Delano Metz, MD [facility] None   Primary Care Physician: Sherron Monday, MD Location: [facility] Out[facility] Management Facility Note by: Oswaldo Done, MD Date: 03/08/2018; Time: 1:03 PM  Disclaimer:  Medicine is not an Visual merchandiser. The only guarantee in medicine is that nothing is guaranteed. It is important to note that the decision to proceed with this intervention was based on the information collected from the patient. The Data and conclusions were drawn from the patient's questionnaire, the interview, and the physical examination. Because the  information was provided in large part by the patient, it cannot be guaranteed that it has not been purposely or unconsciously manipulated. Every effort  has been made to obtain as much relevant data as possible for this evaluation. It is important to note that the conclusions that lead to this procedure are derived in large part from the available data. Always take into account that the treatment will also be dependent on availability of resources and existing treatment guidelines, considered by other Pain Management Practitioners as being common knowledge and practice, at the time of the intervention. For Medico-Legal purposes, it is also important to point out that variation in procedural techniques and pharmacological choices are the acceptable norm. The indications, contraindications, technique, and results of the above procedure should only be interpreted and judged by a Board-Certified Interventional Pain Specialist with extensive familiarity and expertise in the same exact procedure and technique.

## 2018-03-09 ENCOUNTER — Telehealth: Payer: Self-pay

## 2018-03-09 NOTE — Telephone Encounter (Signed)
Post procedue phone call.  Patient states she is doing OK.

## 2018-03-11 ENCOUNTER — Telehealth: Payer: Self-pay | Admitting: *Deleted

## 2018-03-14 NOTE — Telephone Encounter (Signed)
Patient states he will run out of meds early. Only got #60 tabs. Informed him that the prescription was written for #100 tabs. He will call his pharmacy.

## 2018-03-20 NOTE — Progress Notes (Signed)
Patient's Name: Derek [name]  MRN: [id number]  Referring Provider: Jodi Marble, MD  DOB: 1980-07-08  PCP: Jodi Marble, MD  DOS: 03/21/2018  Note by: Gaspar Cola, MD  Service setting: Ambulatory outpatient  Specialty: Interventional Pain Management  Location: [facility] (AMB) Pain Management Facility    Patient type: Established   Primary Reason(s) for Visit: Encounter for post-procedure evaluation of chronic illness with mild to moderate exacerbation CC: Back Pain (low) and Leg Pain (posterior to ankle)  HPI  Derek [name] is a 37 y.o. year old, male patient, who comes today for a post-procedure evaluation. He has Lumbar pseudoarthrosis (L5-S1); Tobacco use; Chronic low back pain (Primary Area of Pain) (Bilateral) (L>R) w/ sciatica (Bilateral); Chronic lower extremity pain (Secondary Area of Pain) (Bilateral) (L>R); Chronic pain syndrome; Long term current use of opiate analgesic; Pharmacologic therapy; Disorder of skeletal system; Problems influencing health status; Elevated C-reactive protein (CRP); Vitamin D insufficiency; Failed back surgical syndrome (x3); L5-S1 pseudoarthrosis; Chronic musculoskeletal pain; Spasm of back muscles; Sacroiliac joint dysfunction (Bilateral); Chronic sacroiliac joint pain (Bilateral); Somatic dysfunction of sacroiliac joints (Bilateral); Chronic hip pain (Bilateral); Epidural fibrosis; Lumbar postlaminectomy syndrome; DDD (degenerative disc disease), lumbosacral; Lumbar facet syndrome (Bilateral); Other specified dorsopathies, sacral and sacrococcygeal region; Spondylosis without myelopathy or radiculopathy, lumbosacral region; and Neurogenic pain on their problem list. His primarily concern today is the Back Pain (low) and Leg Pain (posterior to ankle)  Pain Assessment: Location: Lower, Left, Right Back Radiating: legs posteriorly Onset: More than a month ago Duration: Chronic pain Quality: Dull, Aching, Pressure, Constant Severity: 1 /10 (subjective,  self-reported pain score)  Note: Reported level is compatible with observation.                               Timing: Constant Modifying factors: TENS, heat, medications, ice, hot showers, procedures for a brief period BP: 128/74  HR: 97  Derek [name] comes in today for post-procedure evaluation after the treatment done on 03/11/2018.  The patient seems to have done well with the caudal epidural steroid injection in terms of its diagnosis, unfortunately, did not provide him with any long-term benefit in the because of this reason and the fact that he demonstrated epidural fibrosis, we will be scheduling him for a Racz procedure.  Today I took time to explain to him the procedure including the risk and possible complications.  He also asked about spinal cord stimulation which I also explained to him.  Further details on both, my assessment(s), as well as the proposed treatment plan, please see below.  Post-Procedure Assessment  03/08/2018 Procedure: Diagnostic (Midline) caudal ESI #1 + diagnostic epidurogram under fluoroscopic guidance and IV sedation  Pre-procedure pain score:  1/10 Post-procedure pain score: 0/10 (100% relief) Influential Factors: BMI: 25.83 kg/m Intra-procedural challenges: None observed.         Assessment challenges: None detected.              Reported side-effects: None.        Post-procedural adverse reactions or complications: None reported         Sedation: Sedation provided. When no sedatives are used, the analgesic levels obtained are directly associated to the effectiveness of the local anesthetics. However, when sedation is provided, the level of analgesia obtained during the initial 1 hour following the intervention, is believed to be the result of a combination of factors. These factors may include, but are not limited  to: 1. The effectiveness of the local anesthetics used. 2. The effects of the analgesic(s) and/or anxiolytic(s) used. 3. The degree of discomfort  experienced by the patient at the time of the procedure. 4. The patients ability and reliability in recalling and recording the events. 5. The presence and influence of possible secondary gains and/or psychosocial factors. Reported result: Relief experienced during the 1st hour after the procedure: 100 % (Ultra-Short Term Relief) Derek [name] has indicated area to have been numb during this time. Interpretative annotation: Clinically appropriate result. Analgesia during this period is likely to be Local Anesthetic and/or IV Sedative (Analgesic/Anxiolytic) related.          Effects of local anesthetic: The analgesic effects attained during this period are directly associated to the localized infiltration of local anesthetics and therefore cary significant diagnostic value as to the etiological location, or anatomical origin, of the pain. Expected duration of relief is directly dependent on the pharmacodynamics of the local anesthetic used. Long-acting (4-6 hours) anesthetics used.  Reported result: Relief during the next 4 to 6 hour after the procedure: 100 % (Short-Term Relief) Derek [name] has indicated area to have been numb during this time. Interpretative annotation: Clinically appropriate result. Analgesia during this period is likely to be Local Anesthetic-related.          Long-term benefit: Defined as the period of time past the expected duration of local anesthetics (1 hour for short-acting and 4-6 hours for long-acting). With the possible exception of prolonged sympathetic blockade from the local anesthetics, benefits during this period are typically attributed to, or associated with, other factors such as analgesic sensory neuropraxia, antiinflammatory effects, or beneficial biochemical changes provided by agents other than the local anesthetics.  Reported result: Extended relief following procedure: 0 % (Long-Term Relief) It took several hours for the leg pain to ruturn (that night). Back pain  returned back to normal, next day. Interpretative annotation: Clinically possible results. No long-term benefit. Therapeutic failure. Persistent algesic mechanism detected. Etiology is likely mechanical rather than inflammatory.  Current benefits: Defined as reported results that persistent at this point in time.   Analgesia: 0 %            Function: Back to baseline ROM: Back to baseline Interpretative annotation: Persistent symptomatology. Therapeutic failure. Results would suggest persistent aggravating factors.          Interpretation: Results would suggest a successful diagnostic intervention.                  Plan:  At this point, we will proceed with the Racz procedure.                Laboratory Chemistry  Inflammation Markers (CRP: Acute Phase) (ESR: Chronic Phase) Lab Results  Component Value Date   CRP 13 (H) 01/24/2018   ESRSEDRATE 8 01/24/2018                         Renal Markers Lab Results  Component Value Date   BUN 8 01/24/2018   CREATININE 1.02 01/24/2018   BCR 8 (L) 01/24/2018   GFRAA 107 01/24/2018   GFRNONAA 93 01/24/2018                             Hepatic Markers Lab Results  Component Value Date   AST 22 01/24/2018   ALBUMIN 4.4 01/24/2018  Neuropathy Markers Lab Results  Component Value Date   VITAMINB12 462 01/24/2018                        Hematology Parameters Lab Results  Component Value Date   PLT 248 02/21/2015   HGB 13.1 02/21/2015   HCT 39.9 02/21/2015                        Note: Lab results reviewed.  Recent Imaging Results   Results for orders placed in visit on 03/08/18  DG C-Arm 1-60 Min-No Report   Narrative Fluoroscopy was utilized by the requesting physician.  No radiographic  interpretation.    Interpretation Report: Fluoroscopy was used during the procedure to assist with needle guidance. The images were interpreted intraoperatively by the requesting physician.  Meds   Current Outpatient  Medications:  .  Calcium Carbonate-Vit D-Min (GNP CALCIUM 1200) 1200-1000 MG-UNIT CHEW, Chew 1,200 mg by mouth daily with breakfast. Take in combination with vitamin D and magnesium., Disp: 30 tablet, Rfl: 5 .  Cholecalciferol (VITAMIN D3) 5000 units CAPS, Take 1 capsule (5,000 Units total) by mouth daily with breakfast. Take along with calcium and magnesium., Disp: 30 capsule, Rfl: 5 .  [START ON 04/01/2018] cyclobenzaprine (FLEXERIL) 10 MG tablet, Take 1 tablet (10 mg total) by mouth at bedtime., Disp: 30 tablet, Rfl: 2 .  ergocalciferol (VITAMIN D2) 50000 units capsule, Take 1 capsule (50,000 Units total) by mouth 2 (two) times a week. X 6 weeks., Disp: 12 capsule, Rfl: 0 .  Magnesium 500 MG CAPS, Take 1 capsule (500 mg total) by mouth 2 (two) times daily at 8 am and 10 pm., Disp: 60 capsule, Rfl: 5 .  [START ON 04/01/2018] oxyCODONE-acetaminophen (PERCOCET) 10-325 MG tablet, Take 1 tablet by mouth every 6 (six) hours as needed for pain., Disp: 100 tablet, Rfl: 0 .  [START ON 04/01/2018] tiZANidine (ZANAFLEX) 4 MG tablet, Take 1 tablet (4 mg total) by mouth 2 (two) times daily as needed for muscle spasms (For muscle pain/spasm during the day.)., Disp: 60 tablet, Rfl: 2  ROS  Constitutional: Denies any fever or chills Gastrointestinal: No reported hemesis, hematochezia, vomiting, or acute GI distress Musculoskeletal: Denies any acute onset joint swelling, redness, loss of ROM, or weakness Neurological: No reported episodes of acute onset apraxia, aphasia, dysarthria, agnosia, amnesia, paralysis, loss of coordination, or loss of consciousness  Allergies  Mr. Yniguez is allergic to amoxicillin and vicodin [hydrocodone-acetaminophen].  [place]  Drug: Mr. Matsuoka  reports that he does not use drugs. Alcohol:  reports that he drinks alcohol. Tobacco:  reports that he has been smoking. He has a 16.00 pack-year smoking history. He has never used smokeless tobacco. Medical:  has a past medical history of  Anxiety, Arthritis, and GERD (gastroesophageal reflux disease). Surgical: Mr. Leeper  has a past surgical history that includes Back surgery; HAND REIMPLANTED; and Lumbar fusion (11/14). Family: family history is not on file.  Constitutional Exam  General appearance: Well nourished, well developed, and well hydrated. In no apparent acute distress Vitals:   03/21/18 1317  BP: 128/74  Pulse: 97  Resp: 18  Temp: 98.2 F (36.8 C)  TempSrc: Oral  SpO2: 98%  Weight: 180 lb (81.6 kg)  Height: '5\' 10"'  (1.778 m)   BMI Assessment: Estimated body mass index is 25.83 kg/m as calculated from the following:   Height as of this encounter: '5\' 10"'  (1.778 m).   Weight  as of this encounter: 180 lb (81.6 kg).  BMI interpretation table: BMI level Category Range association with higher incidence of chronic pain  <18 kg/m2 Underweight   18.5-24.9 kg/m2 Ideal body weight   25-29.9 kg/m2 Overweight Increased incidence by 20%  30-34.9 kg/m2 Obese (Class I) Increased incidence by 68%  35-39.9 kg/m2 Severe obesity (Class II) Increased incidence by 136%  >40 kg/m2 Extreme obesity (Class III) Increased incidence by 254%   Patient's current BMI Ideal Body weight  Body mass index is 25.83 kg/m. Ideal body weight: 73 kg (160 lb 15 oz) Adjusted ideal body weight: 76.5 kg (168 lb 9 oz)   BMI Readings from Last 4 Encounters:  03/21/18 25.83 kg/m  03/08/18 25.97 kg/m  03/02/18 25.97 kg/m  01/24/18 26.40 kg/m   Wt Readings from Last 4 Encounters:  03/21/18 180 lb (81.6 kg)  03/08/18 181 lb (82.1 kg)  03/02/18 181 lb (82.1 kg)  01/24/18 184 lb (83.5 kg)  Psych/Mental status: Alert, oriented x 3 (person, place, & time)       Eyes: PERLA Respiratory: No evidence of acute respiratory distress  Cervical Spine Area Exam  Skin & Axial Inspection: No masses, redness, edema, swelling, or associated skin lesions Alignment: Symmetrical Functional ROM: Unrestricted ROM      Stability: No instability  detected Muscle Tone/Strength: Functionally intact. No obvious neuro-muscular anomalies detected. Sensory (Neurological): Unimpaired Palpation: No palpable anomalies              Upper Extremity (UE) Exam    Side: Right upper extremity  Side: Left upper extremity  Skin & Extremity Inspection: Skin color, temperature, and hair growth are WNL. No peripheral edema or cyanosis. No masses, redness, swelling, asymmetry, or associated skin lesions. No contractures.  Skin & Extremity Inspection: Skin color, temperature, and hair growth are WNL. No peripheral edema or cyanosis. No masses, redness, swelling, asymmetry, or associated skin lesions. No contractures.  Functional ROM: Unrestricted ROM          Functional ROM: Unrestricted ROM          Muscle Tone/Strength: Functionally intact. No obvious neuro-muscular anomalies detected.  Muscle Tone/Strength: Functionally intact. No obvious neuro-muscular anomalies detected.  Sensory (Neurological): Unimpaired          Sensory (Neurological): Unimpaired          Palpation: No palpable anomalies              Palpation: No palpable anomalies              Provocative Test(s):  Phalen's test: deferred Tinel's test: deferred Apley's scratch test (touch opposite shoulder):  Action 1 (Across chest): deferred Action 2 (Overhead): deferred Action 3 (LB reach): deferred   Provocative Test(s):  Phalen's test: deferred Tinel's test: deferred Apley's scratch test (touch opposite shoulder):  Action 1 (Across chest): deferred Action 2 (Overhead): deferred Action 3 (LB reach): deferred    Thoracic Spine Area Exam  Skin & Axial Inspection: No masses, redness, or swelling Alignment: Symmetrical Functional ROM: Unrestricted ROM Stability: No instability detected Muscle Tone/Strength: Functionally intact. No obvious neuro-muscular anomalies detected. Sensory (Neurological): Unimpaired Muscle strength & Tone: No palpable anomalies  Lumbar Spine Area Exam  Skin &  Axial Inspection: No masses, redness, or swelling Alignment: Symmetrical Functional ROM: Decreased ROM       Stability: No instability detected Muscle Tone/Strength: Functionally intact. No obvious neuro-muscular anomalies detected. Sensory (Neurological): Movement-associated pain Palpation: Uncomfortable       Provocative Tests: Hyperextension/rotation test: deferred  today       Lumbar quadrant test (Kemp's test): deferred today       Lateral bending test: deferred today       Patrick's Maneuver: deferred today                   FABER test: deferred today                   S-I anterior distraction/compression test: deferred today         S-I lateral compression test: deferred today         S-I Thigh-thrust test: deferred today         S-I Gaenslen's test: deferred today          Gait & Posture Assessment  Ambulation: Unassisted Gait: Relatively normal for age and body habitus Posture: WNL   Lower Extremity Exam    Side: Right lower extremity  Side: Left lower extremity  Stability: No instability observed          Stability: No instability observed          Skin & Extremity Inspection: Skin color, temperature, and hair growth are WNL. No peripheral edema or cyanosis. No masses, redness, swelling, asymmetry, or associated skin lesions. No contractures.  Skin & Extremity Inspection: Skin color, temperature, and hair growth are WNL. No peripheral edema or cyanosis. No masses, redness, swelling, asymmetry, or associated skin lesions. No contractures.  Functional ROM: Unrestricted ROM                  Functional ROM: Unrestricted ROM                  Muscle Tone/Strength: Functionally intact. No obvious neuro-muscular anomalies detected.  Muscle Tone/Strength: Functionally intact. No obvious neuro-muscular anomalies detected.  Sensory (Neurological): Unimpaired  Sensory (Neurological): Unimpaired  Palpation: No palpable anomalies  Palpation: No palpable anomalies   Assessment  Primary  Diagnosis & Pertinent Problem List: The primary encounter diagnosis was Chronic low back pain (Primary Area of Pain) (Bilateral) (L>R) w/ sciatica (Bilateral). Diagnoses of Chronic lower extremity pain (Secondary Area of Pain) (Bilateral) (L>R), Failed back surgical syndrome (x3), Lumbar postlaminectomy syndrome, L5-S1 pseudoarthrosis, Epidural fibrosis, Chronic musculoskeletal pain, Spasm of back muscles, and Chronic pain syndrome were also pertinent to this visit.  Status Diagnosis  Unimproved Persistent Persistent 1. Chronic low back pain (Primary Area of Pain) (Bilateral) (L>R) w/ sciatica (Bilateral)   2. Chronic lower extremity pain (Secondary Area of Pain) (Bilateral) (L>R)   3. Failed back surgical syndrome (x3)   4. Lumbar postlaminectomy syndrome   5. L5-S1 pseudoarthrosis   6. Epidural fibrosis   7. Chronic musculoskeletal pain   8. Spasm of back muscles   9. Chronic pain syndrome     Problems updated and reviewed during this visit: No problems updated. Plan of Care  Pharmacotherapy (Medications Ordered): Meds ordered this encounter  Medications  . tiZANidine (ZANAFLEX) 4 MG tablet    Sig: Take 1 tablet (4 mg total) by mouth 2 (two) times daily as needed for muscle spasms (For muscle pain/spasm during the day.).    Dispense:  60 tablet    Refill:  2    Do not place medication on "Automatic Refill". Fill one day early if pharmacy is closed on scheduled refill date.  . cyclobenzaprine (FLEXERIL) 10 MG tablet    Sig: Take 1 tablet (10 mg total) by mouth at bedtime.    Dispense:  30 tablet  Refill:  2    Do not place medication on "Automatic Refill". Fill one day early if pharmacy is closed on scheduled refill date.  Marland Kitchen oxyCODONE-acetaminophen (PERCOCET) 10-325 MG tablet    Sig: Take 1 tablet by mouth every 6 (six) hours as needed for pain.    Dispense:  100 tablet    Refill:  0    Medication for Chronic Pain (G89.4). [place] STOP ACT - Not applicable. Fill one day early if  pharmacy is closed on scheduled refill date.  Do not fill until: 04/01/18 To last until: 05/01/18   Medications administered today: [name] had no medications administered during this visit.   Procedure Orders     Racz (One Day) Lab Orders  No laboratory test(s) ordered today   Imaging Orders  No imaging studies ordered today   Referral Orders  No referral(s) requested today   Interventional management options: Planned, scheduled, and/or pending:   Therapeutic Racz procedure #1 under fluoroscopic guidance and IV sedation   Considering:   Diagnostic (Midline) caudal ESI #1 + diagnostic epidurogram  Possible Racz procedure  Diagnostic bilateral lumbar facet block  Possible bilateral lumbar facet RFA  Diagnostic bilateral sacroiliac joint block  Possible bilateral sacroiliac joint RFA  Possible spinal cord stimulator trial    Palliative PRN treatment(s):   None at this time   Provider-requested follow-up: Return for Procedure (w/ sedation): (ML) RACZ Procedure.  No future appointments. Primary Care Physician: Jodi Marble, MD Location: [facility] Note by: Gaspar Cola, MD Date: 03/21/2018; Time: 2:39 PM

## 2018-03-21 ENCOUNTER — Encounter: Payer: Self-pay | Admitting: Pain Medicine

## 2018-03-21 ENCOUNTER — Ambulatory Visit: Payer: BLUE CROSS/BLUE SHIELD | Attending: Pain Medicine | Admitting: Pain Medicine

## 2018-03-21 ENCOUNTER — Other Ambulatory Visit: Payer: Self-pay

## 2018-03-21 VITALS — BP 128/74 | HR 97 | Temp 98.2°F | Resp 18 | Ht 70.0 in | Wt 180.0 lb

## 2018-03-21 DIAGNOSIS — G9619 Other disorders of meninges, not elsewhere classified: Secondary | ICD-10-CM | POA: Diagnosis not present

## 2018-03-21 DIAGNOSIS — M5441 Lumbago with sciatica, right side: Secondary | ICD-10-CM

## 2018-03-21 DIAGNOSIS — M62830 Muscle spasm of back: Secondary | ICD-10-CM | POA: Diagnosis not present

## 2018-03-21 DIAGNOSIS — Z880 Allergy status to penicillin: Secondary | ICD-10-CM | POA: Insufficient documentation

## 2018-03-21 DIAGNOSIS — Z79899 Other long term (current) drug therapy: Secondary | ICD-10-CM | POA: Insufficient documentation

## 2018-03-21 DIAGNOSIS — M545 Low back pain: Secondary | ICD-10-CM | POA: Diagnosis present

## 2018-03-21 DIAGNOSIS — Z885 Allergy status to narcotic agent status: Secondary | ICD-10-CM | POA: Insufficient documentation

## 2018-03-21 DIAGNOSIS — G894 Chronic pain syndrome: Secondary | ICD-10-CM | POA: Diagnosis not present

## 2018-03-21 DIAGNOSIS — M79661 Pain in right lower leg: Secondary | ICD-10-CM | POA: Insufficient documentation

## 2018-03-21 DIAGNOSIS — G96198 Other disorders of meninges, not elsewhere classified: Secondary | ICD-10-CM

## 2018-03-21 DIAGNOSIS — M79662 Pain in left lower leg: Secondary | ICD-10-CM | POA: Insufficient documentation

## 2018-03-21 DIAGNOSIS — M5442 Lumbago with sciatica, left side: Secondary | ICD-10-CM

## 2018-03-21 DIAGNOSIS — G8929 Other chronic pain: Secondary | ICD-10-CM

## 2018-03-21 DIAGNOSIS — M960 Pseudarthrosis after fusion or arthrodesis: Secondary | ICD-10-CM | POA: Diagnosis not present

## 2018-03-21 DIAGNOSIS — M961 Postlaminectomy syndrome, not elsewhere classified: Secondary | ICD-10-CM

## 2018-03-21 DIAGNOSIS — F17200 Nicotine dependence, unspecified, uncomplicated: Secondary | ICD-10-CM | POA: Diagnosis not present

## 2018-03-21 DIAGNOSIS — M7918 Myalgia, other site: Secondary | ICD-10-CM

## 2018-03-21 DIAGNOSIS — M79605 Pain in left leg: Secondary | ICD-10-CM

## 2018-03-21 DIAGNOSIS — M79604 Pain in right leg: Secondary | ICD-10-CM

## 2018-03-21 MED ORDER — CYCLOBENZAPRINE HCL 10 MG PO TABS: 10.0000 mg | ORAL_TABLET | Freq: Every day | ORAL | 2 refills | Status: DC

## 2018-03-21 MED ORDER — OXYCODONE-ACETAMINOPHEN 10-325 MG PO TABS: 1.0000 | ORAL_TABLET | Freq: Four times a day (QID) | ORAL | 0 refills | Status: DC | PRN

## 2018-03-21 MED ORDER — TIZANIDINE HCL 4 MG PO TABS: 4.0000 mg | ORAL_TABLET | Freq: Two times a day (BID) | ORAL | 2 refills | Status: DC | PRN

## 2018-03-21 NOTE — Patient Instructions (Signed)
____________________________________________________________________________________________  Preparing for Procedure with Sedation  Instructions: . Oral Intake: Do not eat or drink anything for at least 8 hours prior to your procedure. . Transportation: Public transportation is not allowed. Bring an adult driver. The driver must be physically present in our waiting room before any procedure can be started. . Physical Assistance: Bring an adult physically capable of assisting you, in the event you need help. This adult should keep you company at home for at least 6 hours after the procedure. . Blood Pressure Medicine: Take your blood pressure medicine with a sip of water the morning of the procedure. . Blood thinners: Notify our staff if you are taking any blood thinners. Depending on which one you take, there will be specific instructions on how and when to stop it. . Diabetics on insulin: Notify the staff so that you can be scheduled 1st case in the morning. If your diabetes requires high dose insulin, take only  of your normal insulin dose the morning of the procedure and notify the staff that you have done so. . Preventing infections: Shower with an antibacterial soap the morning of your procedure. . Build-up your immune system: Take 1000 mg of Vitamin C with every meal (3 times a day) the day prior to your procedure. . Antibiotics: Inform the staff if you have a condition or reason that requires you to take antibiotics before dental procedures. . Pregnancy: If you are pregnant, call and cancel the procedure. . Sickness: If you have a cold, fever, or any active infections, call and cancel the procedure. . Arrival: You must be in the facility at least 30 minutes prior to your scheduled procedure. . Children: Do not bring children with you. . Dress appropriately: Bring dark clothing that you would not mind if they get stained. . Valuables: Do not bring any jewelry or valuables.  Procedure  appointments are reserved for interventional treatments only. . No Prescription Refills. . No medication changes will be discussed during procedure appointments. . No disability issues will be discussed.  Reasons to call and reschedule or cancel your procedure: (Following these recommendations will minimize the risk of a serious complication.) . Surgeries: Avoid having procedures within 2 weeks of any surgery. (Avoid for 2 weeks before or after any surgery). . Flu Shots: Avoid having procedures within 2 weeks of a flu shots or . (Avoid for 2 weeks before or after immunizations). . Barium: Avoid having a procedure within 7-10 days after having had a radiological study involving the use of radiological contrast. (Myelograms, Barium swallow or enema study). . Heart attacks: Avoid any elective procedures or surgeries for the initial 6 months after a "Myocardial Infarction" (Heart Attack). . Blood thinners: It is imperative that you stop these medications before procedures. Let us know if you if you take any blood thinner.  . Infection: Avoid procedures during or within two weeks of an infection (including chest colds or gastrointestinal problems). Symptoms associated with infections include: Localized redness, fever, chills, night sweats or profuse sweating, burning sensation when voiding, cough, congestion, stuffiness, runny nose, sore throat, diarrhea, nausea, vomiting, cold or Flu symptoms, recent or current infections. It is specially important if the infection is over the area that we intend to treat. . Heart and lung problems: Symptoms that may suggest an active cardiopulmonary problem include: cough, chest pain, breathing difficulties or shortness of breath, dizziness, ankle swelling, uncontrolled high or unusually low blood pressure, and/or palpitations. If you are experiencing any of these symptoms, cancel   your procedure and contact your primary care physician for an evaluation.  Remember:   Regular Business hours are:  Monday to Thursday 8:00 AM to 4:00 PM  Provider's Schedule: Thedore Pickel, MD:  Procedure days: Tuesday and Thursday 7:30 AM to 4:00 PM  Bilal Lateef, MD:  Procedure days: Monday and Wednesday 7:30 AM to 4:00 PM ____________________________________________________________________________________________    

## 2018-03-21 NOTE — Progress Notes (Signed)
Safety precautions to be maintained throughout the outpatient stay will include: orient to surroundings, keep bed in low position, maintain call bell within reach at all times, provide assistance with transfer out of bed and ambulation.  

## 2018-03-22 ENCOUNTER — Telehealth: Payer: Self-pay

## 2018-03-22 NOTE — Telephone Encounter (Signed)
As per Dr. Waynetta SandyNaveira's orders, I scheduled [name] for a RACZ procedure on Thursday Ocotober 3rd at 0745am. I sent Lear NgKiwanna in pharmacy a message thru epic and notified Chip BoerVicki

## 2018-04-07 ENCOUNTER — Encounter: Payer: Self-pay | Admitting: Pain Medicine

## 2018-04-07 ENCOUNTER — Ambulatory Visit (HOSPITAL_BASED_OUTPATIENT_CLINIC_OR_DEPARTMENT_OTHER): Payer: BLUE CROSS/BLUE SHIELD | Admitting: Pain Medicine

## 2018-04-07 ENCOUNTER — Ambulatory Visit
Admission: RE | Admit: 2018-04-07 | Discharge: 2018-04-07 | Disposition: A | Discharge status: Home / Self Care | Payer: BLUE CROSS/BLUE SHIELD | Source: Ambulatory Visit | Attending: Pain Medicine | Admitting: Pain Medicine

## 2018-04-07 VITALS — BP 120/80 | HR 80 | Temp 97.8°F | Resp 18 | Ht 70.0 in | Wt 180.0 lb

## 2018-04-07 DIAGNOSIS — M5137 Other intervertebral disc degeneration, lumbosacral region: Secondary | ICD-10-CM | POA: Insufficient documentation

## 2018-04-07 DIAGNOSIS — G96198 Other disorders of meninges, not elsewhere classified: Secondary | ICD-10-CM

## 2018-04-07 DIAGNOSIS — G8929 Other chronic pain: Secondary | ICD-10-CM

## 2018-04-07 DIAGNOSIS — M5442 Lumbago with sciatica, left side: Secondary | ICD-10-CM

## 2018-04-07 DIAGNOSIS — G894 Chronic pain syndrome: Secondary | ICD-10-CM

## 2018-04-07 DIAGNOSIS — G9619 Other disorders of meninges, not elsewhere classified: Secondary | ICD-10-CM | POA: Diagnosis not present

## 2018-04-07 DIAGNOSIS — M960 Pseudarthrosis after fusion or arthrodesis: Secondary | ICD-10-CM

## 2018-04-07 DIAGNOSIS — M79605 Pain in left leg: Secondary | ICD-10-CM

## 2018-04-07 DIAGNOSIS — M79606 Pain in leg, unspecified: Secondary | ICD-10-CM | POA: Diagnosis present

## 2018-04-07 DIAGNOSIS — M545 Low back pain: Secondary | ICD-10-CM | POA: Diagnosis present

## 2018-04-07 DIAGNOSIS — M5441 Lumbago with sciatica, right side: Secondary | ICD-10-CM

## 2018-04-07 DIAGNOSIS — M79604 Pain in right leg: Secondary | ICD-10-CM

## 2018-04-07 DIAGNOSIS — M961 Postlaminectomy syndrome, not elsewhere classified: Secondary | ICD-10-CM

## 2018-04-07 MED ORDER — LACTATED RINGERS IV SOLN
1000.0000 mL | Freq: Once | INTRAVENOUS | Status: AC
  Administered 2018-04-07: 1000 mL via INTRAVENOUS

## 2018-04-07 MED ORDER — FENTANYL CITRATE (PF) 100 MCG/2ML IJ SOLN
25.0000 ug | INTRAMUSCULAR | Status: DC | PRN
  Administered 2018-04-07: 100 ug via INTRAVENOUS

## 2018-04-07 MED ORDER — IOPAMIDOL (ISOVUE-M 200) INJECTION 41%
INTRAMUSCULAR | Status: AC
  Filled 2018-04-07: qty 10

## 2018-04-07 MED ORDER — GENTAMICIN SULFATE 40 MG/ML IJ SOLN
120.0000 mg | Freq: Once | INTRAVENOUS | Status: AC
  Administered 2018-04-07: 120 mg via INTRAVENOUS
  Filled 2018-04-07: qty 3

## 2018-04-07 MED ORDER — TRIAMCINOLONE ACETONIDE 40 MG/ML IJ SUSP
40.0000 mg | Freq: Once | INTRAMUSCULAR | Status: AC
  Administered 2018-04-07: 40 mg

## 2018-04-07 MED ORDER — ROPIVACAINE HCL 2 MG/ML IJ SOLN
4.0000 mL | Freq: Once | INTRAMUSCULAR | Status: AC
  Administered 2018-04-07: 4 mL via EPIDURAL

## 2018-04-07 MED ORDER — LIDOCAINE HCL 2 % IJ SOLN
20.0000 mL | Freq: Once | INTRAMUSCULAR | Status: AC
  Administered 2018-04-07: 400 mg

## 2018-04-07 MED ORDER — MIDAZOLAM HCL 5 MG/5ML IJ SOLN
1.0000 mg | INTRAMUSCULAR | Status: DC | PRN
  Administered 2018-04-07: 5 mg via INTRAVENOUS

## 2018-04-07 MED ORDER — MIDAZOLAM HCL 5 MG/5ML IJ SOLN
INTRAMUSCULAR | Status: AC
  Filled 2018-04-07: qty 5

## 2018-04-07 MED ORDER — SODIUM CHLORIDE 0.9% FLUSH
4.0000 mL | Freq: Once | INTRAVENOUS | Status: AC
  Administered 2018-04-07: 4 mL

## 2018-04-07 MED ORDER — LIDOCAINE-EPINEPHRINE (PF) 2 %-1:200000 IJ SOLN
10.0000 mL | Freq: Once | INTRAMUSCULAR | Status: AC
  Administered 2018-04-07: 20 mL
  Filled 2018-04-07: qty 20

## 2018-04-07 MED ORDER — ROPIVACAINE HCL 2 MG/ML IJ SOLN
INTRAMUSCULAR | Status: AC
  Filled 2018-04-07: qty 10

## 2018-04-07 MED ORDER — HYALURONIDASE HUMAN 150 UNIT/ML IJ SOLN
1500.0000 [IU] | Freq: Once | INTRAMUSCULAR | Status: AC
  Administered 2018-04-07: 1500 [IU] via INTRADERMAL
  Filled 2018-04-07: qty 10

## 2018-04-07 MED ORDER — TRIAMCINOLONE ACETONIDE 40 MG/ML IJ SUSP
INTRAMUSCULAR | Status: AC
  Filled 2018-04-07: qty 1

## 2018-04-07 MED ORDER — FENTANYL CITRATE (PF) 100 MCG/2ML IJ SOLN
INTRAMUSCULAR | Status: AC
  Filled 2018-04-07: qty 2

## 2018-04-07 MED ORDER — STERILE WATER FOR INJECTION IJ SOLN
10.0000 mL | Freq: Once | INTRAVENOUS | Status: AC
  Administered 2018-04-07: 10 mL via EPIDURAL
  Filled 2018-04-07: qty 4.27

## 2018-04-07 MED ORDER — IOPAMIDOL (ISOVUE-M 200) INJECTION 41%
10.0000 mL | Freq: Once | INTRAMUSCULAR | Status: AC
  Administered 2018-04-07: 10 mL via EPIDURAL
  Filled 2018-04-07: qty 10

## 2018-04-07 NOTE — Progress Notes (Signed)
Safety precautions to be maintained throughout the outpatient stay will include: orient to surroundings, keep bed in low position, maintain call bell within reach at all times, provide assistance with transfer out of bed and ambulation.  

## 2018-04-07 NOTE — Progress Notes (Signed)
Patient's Name: Derek [name]  MRN: [id number]  Referring Provider: Milinda Pointer, MD  DOB: Jan 15, 1981  PCP: Jodi Marble, MD  DOS: 04/07/2018  Note by: Gaspar Cola, MD  Service setting: Ambulatory outpatient  Specialty: Interventional Pain Management  Patient type: Established  Location: [facility] (AMB) Pain Management Facility  Visit type: Interventional Procedure   Primary Reason for Visit: Interventional Pain Management Treatment. CC: Back Pain (low) and Leg Pain  Procedure:          Anesthesia, Analgesia, Anxiolysis:  Type: Therapeutic Percutaneous Epidural Neuroplasty and Lysis of Adhesions (RACZ Procedure)  #1  Region: Caudal Level: Sacrococcygeal   Laterality: Midline aiming at the left  Type: Moderate (Conscious) Sedation combined with Local Anesthesia Indication(s): Analgesia and Anxiety Route: Intravenous (IV) IV Access: Secured Sedation: Meaningful verbal contact was maintained at all times during the procedure  Local Anesthetic: Lidocaine 1-2%  Position: Prone   Indications: 1. Failed back surgical syndrome (x3)   2. Epidural fibrosis   3. DDD (degenerative disc disease), lumbosacral   4. Chronic lower extremity pain (Secondary Area of Pain) (Bilateral) (L>R)   5. Chronic low back pain (Primary Area of Pain) (Bilateral) (L>R) w/ sciatica (Bilateral)   6. Chronic pain syndrome    Pain Score: Pre-procedure: 2 /10 Post-procedure: 0-No pain/10  Pre-op Assessment:  Mr. Leicht is a 37 y.o. (year old), male patient, seen today for interventional treatment. He  has a past surgical history that includes Back surgery; HAND REIMPLANTED; and Lumbar fusion (11/14). Mr. Pullin has a current medication list which includes the following prescription(s): gnp calcium 1200, vitamin d3, cyclobenzaprine, ergocalciferol, magnesium, oxycodone-acetaminophen, and tizanidine, and the following Facility-Administered Medications: fentanyl and midazolam. His primarily concern today is  the Back Pain (low) and Leg Pain  Initial Vital Signs:  Pulse/HCG Rate: 80ECG Heart Rate: 86 Temp: 98.1 F (36.7 C) Resp: 16 BP: 123/82 SpO2: 100 %  BMI: Estimated body mass index is 25.83 kg/m as calculated from the following:   Height as of this encounter: '5\' 10"'  (1.778 m).   Weight as of this encounter: 180 lb (81.6 kg).  Risk Assessment: Allergies: Reviewed. He is allergic to amoxicillin and vicodin [hydrocodone-acetaminophen].  Allergy Precautions: None required Coagulopathies: Reviewed. None identified.  Blood-thinner therapy: None at this time Active Infection(s): Reviewed. None identified. Mr. Dettman is afebrile  Site Confirmation: Mr. Mukai was asked to confirm the procedure and laterality before marking the site Procedure checklist: Completed Consent: Before the procedure and under the influence of no sedative(s), amnesic(s), or anxiolytics, the patient was informed of the treatment options, risks and possible complications. To fulfill our ethical and legal obligations, as recommended by the American Medical Association's Code of Ethics, I have informed the patient of my clinical impression; the nature and purpose of the treatment or procedure; the risks, benefits, and possible complications of the intervention; the alternatives, including doing nothing; the risk(s) and benefit(s) of the alternative treatment(s) or procedure(s); and the risk(s) and benefit(s) of doing nothing. The patient was provided information about the general risks and possible complications associated with the procedure. These may include, but are not limited to: failure to achieve desired goals, infection, bleeding, organ or nerve damage, allergic reactions, paralysis, and death. In addition, the patient was informed of those risks and complications associated to Spine-related procedures, such as failure to decrease pain; infection (i.e.: Meningitis, epidural or intraspinal abscess); bleeding (i.e.: epidural  hematoma, subarachnoid hemorrhage, or any other type of intraspinal or peri-dural bleeding); organ or  nerve damage (i.e.: Any type of peripheral nerve, nerve root, or spinal cord injury) with subsequent damage to sensory, motor, and/or autonomic systems, resulting in permanent pain, numbness, and/or weakness of one or several areas of the body; allergic reactions; (i.e.: anaphylactic reaction); and/or death. Furthermore, the patient was informed of those risks and complications associated with the medications. These include, but are not limited to: allergic reactions (i.e.: anaphylactic or anaphylactoid reaction(s)); adrenal axis suppression; blood sugar elevation that in diabetics may result in ketoacidosis or comma; water retention that in patients with history of congestive heart failure may result in shortness of breath, pulmonary edema, and decompensation with resultant heart failure; weight gain; swelling or edema; medication-induced neural toxicity; particulate matter embolism and blood vessel occlusion with resultant organ, and/or nervous system infarction; and/or aseptic necrosis of one or more joints. Finally, the patient was informed that Medicine is not an exact science; therefore, there is also the possibility of unforeseen or unpredictable risks and/or possible complications that may result in a catastrophic outcome. The patient indicated having understood very clearly. We have given the patient no guarantees and we have made no promises. Enough time was given to the patient to ask questions, all of which were answered to the patient's satisfaction. Mr. Losito has indicated that he wanted to continue with the procedure. Attestation: I, the ordering provider, attest that I have discussed with the patient the benefits, risks, side-effects, alternatives, likelihood of achieving goals, and potential problems during recovery for the procedure that I have provided informed consent. Date   Time: 04/07/2018   8:03 AM  Pre-Procedure Preparation:  Monitoring: As per clinic protocol. Respiration, ETCO2, SpO2, BP, heart rate and rhythm monitor placed and checked for adequate function Safety Precautions: Patient was assessed for positional comfort and pressure points before starting the procedure. Time-out: I initiated and conducted the "Time-out" before starting the procedure, as per protocol. The patient was asked to participate by confirming the accuracy of the "Time Out" information. Verification of the correct person, site, and procedure were performed and confirmed by me, the nursing staff, and the patient. "Time-out" conducted as per Joint Commission's Universal Protocol (UP.01.01.01). Time: 0856  Description of Procedure:          Target Area: Caudal Epidural Canal Approach: Midline approach Area Prepped: Entire Posterior Sacrococcygeal Region Prepping solution: ChloraPrep (2% chlorhexidine gluconate and 70% isopropyl alcohol) Safety Precautions: Aspiration looking for blood return was conducted prior to all injections. At no point did I inject any substances, as a needle was being advanced. No attempts were made at seeking any paresthesias. Safe injection practices and needle disposal techniques used. Medications properly checked for expiration dates. SDV (single dose vial) medications used. Description of the Procedure: Protocol guidelines were followed. The patient was placed in position over the fluoroscopy table. Patient assessed for comfort and pressure points before starting procedure. The target area was identified and the area prepped in the usual manner. Skin & deeper tissues infiltrated with local anesthetic. Appropriate amount of time allowed to pass for local anesthetics to take effect. The epidural needle was then advanced to the target area, via the sacral hiatus, between the sacral cornu. Proper needle placement secured. Negative aspiration confirmed. Step 1: Epidurogram performed by  slowly injecting a non-ionic, water-soluble, hypoallergenic, myelogram-compatible radiological contrast. Defect identified and Racz catheter advanced slowly next to rest proximal to it without attempting to perforate or puncture the defect. At this point, the epidural needle was removed. Step 2: Contrast was again injected,  this time thru the catheter, under live fluoroscopy, closely observing for vascular uptake or intrathecal spread. Step 3: Once no vascular uptake or intrathecal spread was confirmed, a 2 mL test-dose using 2% PF-Lidocaine with 1:200,000 Epinephrine was injected thru the catheter, while closely monitoring for an increase in heart rate or evidence of spinal anesthesia. Step 4: After waiting over 5 minutes, the patient was assessed for evidence of a spinal block. Step 5: Once I had confirmed that there was no vascular uptake or evidence of intrathecal spread, I then slowly injected 1,500 Units of hyaluronidase, carefully monitoring for allergic reactions. I then waited 10 minutes, using this time to secure the catheter using a sterile benzoin tincture swab and (53m x 100 mm) steri-strip. After confirming vitals to be stable, the patient was transferred to the recovery area where Mr. JAhrwas kept under constant observation and monitored as per post-sedation protocol. Step 6: After the 10 minutes, I proceeded to slowly inject a solution containing 0.2% MPF-Ropivacaine (4 mL) + 0.9% PF-NSS (5 mL) + SDV Triamcinolone 40 mg/mL (1 mL), in intermittent fashion, asking for systemic symptoms every 0.5cc of injectate. Step 7:  30 minutes later, a neurological exam was conducted for any evidence of a spinal blockade. Step 8:  After confirming the absence of anesthesia, I slowly injected the 10% PF-Hypertonic Saline, stopping to assess, any time the patient described any discomfort. Once the procedure was completed, I removed the catheter, taking time to show the patient its spring tip, and  demonstrating to the patient that none had been left behind. EBL: None Materials & Medications used:  1. Racz Tun-L-Kath (Epimed, [place] Catheter. (or similar)(Perifix 20Gx100cm) 2. 2 Foam Tape 3. Benzoin tincture swab 4. Steri-Strip (134mx 100 mm) 5. Non-occlusive Transparent Dressing (72M Tegaderm) 6. Bacteriostatic Filter for Epidural Catheter 7. Epidural Kit for 20G catheter Needle(s) used: 20g - 10cm, Tuohy-style epidural needle   Medications used:  1. Skin infiltration: 2.0% Lidocaine (1038m2. Test-dose: 1.5 % Lidocaine w/ Epi 1:200,000 (5ml106m. Epidural Steroid injection:  A). Steroid: Triamcinolone 40 mg/mL (SDV) (1ml)55m. Local Anesthetic: 0.2% PF-Ropivacaine (2 mg/mL) (4 mL) C). Dilution agent: 0.9% PF-NSS (injectable saline) (5 mL) 6. Neurolytic Agent: 10% PF-Sodium Chloride (Hypertonic Saline) (10ml)72m.4% PF-Sodium Chloride (4.273mL) 74m-Sterile H2O (5.727mL) =76m PF-Sodium Chloride (10mL)] 767mar softening agent: Hyaluronidase (150 units/mL) x (10 mL) = 1500 Units 8. Radiological Contrast Media: Isovue-M 200 (10 mL)  Vitals:   04/07/18 1040 04/07/18 1050 04/07/18 1100 04/07/18 1110  BP: 114/67 (!) 132/53 123/71 120/80  Pulse:      Resp: '16 16 17 18  ' Temp:    97.8 F (36.6 C)  SpO2: 99% 99% 99% 98%  Weight:      Height:        Start Time: 0856 hrs. End Time: 0929 hrs. Epidurogram:  Epidurogram flow pattern demonstrated a restricted low at the level of the surgery. The epidural catheter was placed next to this restriction without attempting to perforate it.  Initial injection of the contrast demonstrated flow into the right side of the lumbosacral epidural canal with adequate flow to the S1 region.  None of the S1 on the left side was observed.  However, once the treatment began and further contrast was injected it was clear that we were able to access the left side and through the midline go above the L5-S1 intervertebral space with some contrast  observed to be in the region of the L5, bilaterally,  but still not the region below the pedicles of L5.      Materials:  Needle(s) Type: Epidural needle Gauge: 20G Length: 3.5-in Medication(s): Please see orders for medications and dosing details.  Imaging Guidance (Spinal):          Type of Imaging Technique: Fluoroscopy Guidance (Spinal) Indication(s): Assistance in needle guidance and placement for procedures requiring needle placement in or near specific anatomical locations not easily accessible without such assistance. Exposure Time: Please see nurses notes. Contrast: Before injecting any contrast, we confirmed that the patient did not have an allergy to iodine, shellfish, or radiological contrast. Once satisfactory needle placement was completed at the desired level, radiological contrast was injected. Contrast injected under live fluoroscopy. No contrast complications. See chart for type and volume of contrast used. Fluoroscopic Guidance: I was personally present during the use of fluoroscopy. "Tunnel Vision Technique" used to obtain the best possible view of the target area. Parallax error corrected before commencing the procedure. "Direction-depth-direction" technique used to introduce the needle under continuous pulsed fluoroscopy. Once target was reached, antero-posterior, oblique, and lateral fluoroscopic projection used confirm needle placement in all planes. Images permanently stored in EMR.      Interpretation: I personally interpreted the imaging intraoperatively. Adequate needle placement confirmed in multiple planes. Appropriate spread of contrast into desired area was observed. No evidence of afferent or efferent intravascular uptake. No intrathecal or subarachnoid spread observed. Permanent images saved into the patient's record.  Antibiotic Prophylaxis:   Anti-infectives (From admission, onward)   Start     Dose/Rate Route Frequency Ordered Stop   04/07/18 0845   gentamicin (GARAMYCIN) 120 mg in dextrose 5 % 100 mL IVPB     120 mg 206 mL/hr over 30 Minutes Intravenous  Once 04/07/18 0816 04/07/18 0910     Indication(s): None identified  Post-operative Assessment:  Post-procedure Vital Signs:  Pulse/HCG Rate: 8080 Temp: 97.8 F (36.6 C) Resp: 18 BP: 120/80 SpO2: 98 %  EBL: None  Complications: No immediate post-treatment complications observed by team, or reported by patient.  Note: The patient tolerated the entire procedure well. A repeat set of vitals were taken after the procedure and the patient was kept under observation following institutional policy, for this type of procedure. Post-procedural neurological assessment was performed, showing return to baseline, prior to discharge. The patient was provided with post-procedure discharge instructions, including a section on how to identify potential problems. Should any problems arise concerning this procedure, the patient was given instructions to immediately contact us, at any time, without hesitation. In any case, we plan to contact the patient by telephone for a follow-up status report regarding this interventional procedure.  Comments:  No additional relevant information.  Plan of Care  The patient will return to see Dionisio David, NP for postprocedure follow-up.  At that time we will reassess what kind of pain he has still remaining and we will consider one of the options below.  If he is doing well, we will simply continue to follow-up with him on a as needed basis.  Possible POC:  Diagnostic bilateral lumbar facet block Possible bilateral lumbar facet RFA Diagnostic bilateral sacroiliac joint block Possible bilateral sacroiliac joint RFA Possible spinal cord stimulator trial   Palliative PRN treatment(s):   Therapeutic Racz procedure #2 (no sooner than 6 months from now)   Imaging Orders     DG C-Arm 1-60 Min-No Report  Procedure Orders     Racz (One Day)  Medications  ordered for procedure: Meds  ordered this encounter  Medications   iopamidol (ISOVUE-M) 41 % intrathecal injection 10 mL    Must be Myelogram-compatible. If not available, you may substitute with a water-soluble, non-ionic, hypoallergenic, myelogram-compatible radiological contrast medium.   lidocaine (XYLOCAINE) 2 % (with pres) injection 400 mg   midazolam (VERSED) 5 MG/5ML injection 1-2 mg    Make sure Flumazenil is available in the pyxis when using this medication. If oversedation occurs, administer 0.2 mg IV over 15 sec. If after 45 sec no response, administer 0.2 mg again over 1 min; may repeat at 1 min intervals; not to exceed 4 doses (1 mg)   fentaNYL (SUBLIMAZE) injection 25-50 mcg    Make sure Narcan is available in the pyxis when using this medication. In the event of respiratory depression (RR< 8/min): Titrate NARCAN (naloxone) in increments of 0.1 to 0.2 mg IV at 2-3 minute intervals, until desired degree of reversal.   lactated ringers infusion 1,000 mL   gentamicin (GARAMYCIN) 120 mg in dextrose 5 % 100 mL IVPB    Order Specific Question:   Antibiotic Indication:    Answer:   Surgical Prophylaxis   lidocaine-EPINEPHrine (XYLOCAINE W/EPI) 2 %-1:200000 (PF) injection 10 mL    If 2% is unavailable, may be substituted with 1.5%, but must be preservative-free. If 1:200,000 concentration of epinephrine is not available, it may be substituted with 1:100,000. Notify physician of changes, before procedure.   ropivacaine (PF) 2 mg/mL (0.2%) (NAROPIN) injection 4 mL   triamcinolone acetonide (KENALOG-40) injection 40 mg   sodium chloride flush (NS) 0.9 % injection 4 mL   hyaluronidase Human (HYLENEX) injection 1,500 Units    10 mL of the 150 Units/mL   sodium chloride hypertonic 10% epidural injection    For Racz Epidural Lysis of Adhesions.   Medications administered: We administered iopamidol, lidocaine, midazolam, fentaNYL, lactated ringers, gentamicin (GARAMYCIN) 120 mg in  dextrose 5 % 100 mL IVPB, lidocaine-EPINEPHrine, ropivacaine (PF) 2 mg/mL (0.2%), triamcinolone acetonide, sodium chloride flush, hyaluronidase Human, and sodium chloride hypertonic 10% epidural injection.  See the medical record for exact dosing, route, and time of administration.  New Prescriptions   No medications on file   Disposition: Discharge home  Discharge Date & Time: 04/07/2018; 1127 hrs.   Physician-requested Follow-up: Return for post-procedure eval (2 wks), w/ Dionisio David, NP.  Future Appointments  Date Time Provider [place]  04/21/2018 10:45 AM Vevelyn Francois, NP [facility] None   Primary Care Physician: Jodi Marble, MD Location: [facility] Outpatient Pain Management Facility Note by: Gaspar Cola, MD Date: 04/07/2018; Time: 12:07 PM  Disclaimer:  Medicine is not an Chief Strategy Officer. The only guarantee in medicine is that nothing is guaranteed. It is important to note that the decision to proceed with this intervention was based on the information collected from the patient. The Data and conclusions were drawn from the patient's questionnaire, the interview, and the physical examination. Because the information was provided in large part by the patient, it cannot be guaranteed that it has not been purposely or unconsciously manipulated. Every effort has been made to obtain as much relevant data as possible for this evaluation. It is important to note that the conclusions that lead to this procedure are derived in large part from the available data. Always take into account that the treatment will also be dependent on availability of resources and existing treatment guidelines, considered by other Pain Management Practitioners as being common knowledge and practice, at the time of the intervention.  For Medico-Legal purposes, it is also important to point out that variation in procedural techniques and pharmacological choices are the acceptable norm. The indications,  contraindications, technique, and results of the above procedure should only be interpreted and judged by a Board-Certified Interventional Pain Specialist with extensive familiarity and expertise in the same exact procedure and technique.

## 2018-04-07 NOTE — Patient Instructions (Signed)

## 2018-04-08 ENCOUNTER — Telehealth: Payer: Self-pay | Admitting: *Deleted

## 2018-04-08 NOTE — Telephone Encounter (Signed)
Callback, re; procedure.  Voicemail left to call if any questions or concerns.

## 2018-04-21 ENCOUNTER — Encounter: Payer: Self-pay | Admitting: Nurse Practitioner

## 2018-04-21 ENCOUNTER — Other Ambulatory Visit: Payer: Self-pay

## 2018-04-21 ENCOUNTER — Ambulatory Visit: Payer: BLUE CROSS/BLUE SHIELD | Attending: Nurse Practitioner | Admitting: Nurse Practitioner

## 2018-04-21 VITALS — BP 139/88 | HR 117 | Temp 98.6°F | Resp 16 | Ht 70.0 in | Wt 180.0 lb

## 2018-04-21 DIAGNOSIS — M47816 Spondylosis without myelopathy or radiculopathy, lumbar region: Secondary | ICD-10-CM

## 2018-04-21 DIAGNOSIS — F419 Anxiety disorder, unspecified: Secondary | ICD-10-CM | POA: Diagnosis not present

## 2018-04-21 DIAGNOSIS — Z885 Allergy status to narcotic agent status: Secondary | ICD-10-CM | POA: Insufficient documentation

## 2018-04-21 DIAGNOSIS — F17200 Nicotine dependence, unspecified, uncomplicated: Secondary | ICD-10-CM | POA: Insufficient documentation

## 2018-04-21 DIAGNOSIS — M533 Sacrococcygeal disorders, not elsewhere classified: Secondary | ICD-10-CM | POA: Insufficient documentation

## 2018-04-21 DIAGNOSIS — M961 Postlaminectomy syndrome, not elsewhere classified: Secondary | ICD-10-CM | POA: Insufficient documentation

## 2018-04-21 DIAGNOSIS — G9619 Other disorders of meninges, not elsewhere classified: Secondary | ICD-10-CM | POA: Diagnosis not present

## 2018-04-21 DIAGNOSIS — M9904 Segmental and somatic dysfunction of sacral region: Secondary | ICD-10-CM | POA: Insufficient documentation

## 2018-04-21 DIAGNOSIS — Z880 Allergy status to penicillin: Secondary | ICD-10-CM | POA: Diagnosis not present

## 2018-04-21 DIAGNOSIS — Z79899 Other long term (current) drug therapy: Secondary | ICD-10-CM | POA: Insufficient documentation

## 2018-04-21 DIAGNOSIS — G894 Chronic pain syndrome: Secondary | ICD-10-CM | POA: Diagnosis not present

## 2018-04-21 DIAGNOSIS — M5137 Other intervertebral disc degeneration, lumbosacral region: Secondary | ICD-10-CM | POA: Diagnosis not present

## 2018-04-21 DIAGNOSIS — E559 Vitamin D deficiency, unspecified: Secondary | ICD-10-CM | POA: Diagnosis not present

## 2018-04-21 DIAGNOSIS — K219 Gastro-esophageal reflux disease without esophagitis: Secondary | ICD-10-CM | POA: Insufficient documentation

## 2018-04-21 DIAGNOSIS — M47817 Spondylosis without myelopathy or radiculopathy, lumbosacral region: Secondary | ICD-10-CM | POA: Diagnosis not present

## 2018-04-21 DIAGNOSIS — Z79891 Long term (current) use of opiate analgesic: Secondary | ICD-10-CM | POA: Insufficient documentation

## 2018-04-21 MED ORDER — OXYCODONE-ACETAMINOPHEN 10-325 MG PO TABS: 1.0000 | ORAL_TABLET | Freq: Four times a day (QID) | ORAL | 0 refills | Status: DC | PRN

## 2018-04-21 NOTE — Patient Instructions (Addendum)
____________________________________________________________________________________________  Medication Rules  Applies to: All patients receiving prescriptions (written or electronic).  Pharmacy of record: Pharmacy where electronic prescriptions will be sent. If written prescriptions are taken to a different pharmacy, please inform the nursing staff. The pharmacy listed in the electronic medical record should be the one where you would like electronic prescriptions to be sent.  Prescription refills: Only during scheduled appointments. Applies to both, written and electronic prescriptions.  NOTE: The following applies primarily to controlled substances (Opioid* Pain Medications).   Patient's responsibilities: 1. Pain Pills: Bring all pain pills to every appointment (except for procedure appointments). 2. Pill Bottles: Bring pills in original pharmacy bottle. Always bring newest bottle. Bring bottle, even if empty. 3. Medication refills: You are responsible for knowing and keeping track of what medications you need refilled. The day before your appointment, write a list of all prescriptions that need to be refilled. Bring that list to your appointment and give it to the admitting nurse. Prescriptions will be written only during appointments. If you forget a medication, it will not be "Called in", "Faxed", or "electronically sent". You will need to get another appointment to get these prescribed. 4. Prescription Accuracy: You are responsible for carefully inspecting your prescriptions before leaving our office. Have the discharge nurse carefully go over each prescription with you, before taking them home. Make sure that your name is accurately spelled, that your address is correct. Check the name and dose of your medication to make sure it is accurate. Check the number of pills, and the written instructions to make sure they are clear and accurate. Make sure that you are given enough medication to last  until your next medication refill appointment. 5. Taking Medication: Take medication as prescribed. Never take more pills than instructed. Never take medication more frequently than prescribed. Taking less pills or less frequently is permitted and encouraged, when it comes to controlled substances (written prescriptions).  6. Inform other Doctors: Always inform, all of your healthcare providers, of all the medications you take. 7. Pain Medication from other Providers: You are not allowed to accept any additional pain medication from any other Doctor or Healthcare provider. There are two exceptions to this rule. (see below) In the event that you require additional pain medication, you are responsible for notifying us, as stated below. 8. Medication Agreement: You are responsible for carefully reading and following our Medication Agreement. This must be signed before receiving any prescriptions from our practice. Safely store a copy of your signed Agreement. Violations to the Agreement will result in no further prescriptions. (Additional copies of our Medication Agreement are available upon request.) 9. Laws, Rules, & Regulations: All patients are expected to follow all Federal and State Laws, Statutes, Rules, & Regulations. Ignorance of the Laws does not constitute a valid excuse. The use of any illegal substances is prohibited. 10. Adopted CDC guidelines & recommendations: Target dosing levels will be at or below 60 MME/day. Use of benzodiazepines** is not recommended.  Exceptions: There are only two exceptions to the rule of not receiving pain medications from other Healthcare Providers. 1. Exception #1 (Emergencies): In the event of an emergency (i.e.: accident requiring emergency care), you are allowed to receive additional pain medication. However, you are responsible for: As soon as you are able, call our office [phone], at any time of the day or night, and leave a message stating your name, the  date and nature of the emergency, and the name and dose of the medication   prescribed. In the event that your call is answered by a member of our staff, make sure to document and save the date, time, and the name of the person that took your information.  2. Exception #2 (Planned Surgery): In the event that you are scheduled by another doctor or dentist to have any type of surgery or procedure, you are allowed (for a period no longer than 30 days), to receive additional pain medication, for the acute post-op pain. However, in this case, you are responsible for picking up a copy of our "Post-op Pain Management for Surgeons" handout, and giving it to your surgeon or dentist. This document is available at our office, and does not require an appointment to obtain it. Simply go to our office during business hours (Monday-Thursday from 8:00 AM to 4:00 PM) (Friday 8:00 AM to 12:00 Noon) or if you have a scheduled appointment with [place], prior to your surgery, and ask for it by name. In addition, you will need to provide [place] with your name, name of your surgeon, type of surgery, and date of procedure or surgery.  *Opioid medications include: morphine, codeine, oxycodone, oxymorphone, hydrocodone, hydromorphone, meperidine, tramadol, tapentadol, buprenorphine, fentanyl, methadone. **Benzodiazepine medications include: diazepam (Valium), alprazolam (Xanax), clonazepam (Klonopine), lorazepam (Ativan), clorazepate (Tranxene), chlordiazepoxide (Librium), estazolam (Prosom), oxazepam (Serax), temazepam (Restoril), triazolam (Halcion) (Last updated: 09/02/2017) ____________________________________________________________________________________________   Oxycodone was e-scribed to your pharmacy of choice.

## 2018-04-21 NOTE — Progress Notes (Signed)
Patient's Name: Derek [name]  MRN: [id number]  Referring Provider: Jodi Marble, MD  DOB: March 20, 1981  PCP: Jodi Marble, MD  DOS: 04/21/2018  Note by: Vevelyn Francois NP  Service setting: Ambulatory outpatient  Specialty: Interventional Pain Management  Location: [facility] (AMB) Pain Management Facility    Patient type: Established    Primary Reason(s) for Visit: Encounter for prescription drug management & post-procedure evaluation of chronic illness with mild to moderate exacerbation(Level of risk: moderate) CC: Back Pain (lower)  HPI  Derek [name] is a 37 y.o. year old, male patient, who comes today for a post-procedure evaluation and medication management. He has Lumbar pseudoarthrosis (L5-S1); Tobacco use; Chronic low back pain (Primary Area of Pain) (Bilateral) (L>R) w/ sciatica (Bilateral); Chronic lower extremity pain (Secondary Area of Pain) (Bilateral) (L>R); Chronic pain syndrome; Long term current use of opiate analgesic; Pharmacologic therapy; Disorder of skeletal system; Problems influencing health status; Elevated C-reactive protein (CRP); Vitamin D insufficiency; Failed back surgical syndrome (x3); L5-S1 pseudoarthrosis; Chronic musculoskeletal pain; Spasm of back muscles; Sacroiliac joint dysfunction (Bilateral); Chronic sacroiliac joint pain (Bilateral); Somatic dysfunction of sacroiliac joints (Bilateral); Chronic hip pain (Bilateral); Epidural fibrosis; Lumbar postlaminectomy syndrome; DDD (degenerative disc disease), lumbosacral; Lumbar facet syndrome (Bilateral); Other specified dorsopathies, sacral and sacrococcygeal region; Spondylosis without myelopathy or radiculopathy, lumbosacral region; and Neurogenic pain on their problem list. His primarily concern today is the Back Pain (lower)  Pain Assessment: Location: Lower Back Radiating: denies Onset: More than a month ago Duration: Chronic pain Quality: Aching, Dull, Sharp Severity: 1 /10 (subjective, self-reported pain  score)  Note: Reported level is compatible with observation.                          Effect on ADL:   Timing: Constant Modifying factors: heat, ice, rest, medications BP: 139/88  HR: (!) 117  Derek [name] was last seen on 04/08/2018 for a procedure. During today's appointment we reviewed Derek [name] post-procedure results, as well as his outpatient medication regimen.  He admits that he is having good relief buttocks and in his legs.  Is very pleased with the Racz procedure he admits that he would like to have some additional therapy because pain is getting a little higher up in his back but he will call later to have this scheduled.  Further details on both, my assessment(s), as well as the proposed treatment plan, please see below.  Controlled Substance Pharmacotherapy Assessment REMS (Risk Evaluation and Mitigation Strategy)  Analgesic: Oxycodone/acetaminophen 10/325 mg oxycodone 40 mg/day MME/day:74m/day WLandis Martins RN  04/21/2018 10:52 AM  Sign at close encounter Nursing Pain Medication Assessment:  Safety precautions to be maintained throughout the outpatient stay will include: orient to surroundings, keep bed in low position, maintain call bell within reach at all times, provide assistance with transfer out of bed and ambulation.  Medication Inspection Compliance: Pill count conducted under aseptic conditions, in front of the patient. Neither the pills nor the bottle was removed from the patient's sight at any time. Once count was completed pills were immediately returned to the patient in their original bottle.  Medication: Oxycodone/APAP Pill/Patch Count: 16 of 100 pills remain Pill/Patch Appearance: Markings consistent with prescribed medication Bottle Appearance: Standard pharmacy container. Clearly labeled. Filled Date: 09/27 / 2019 Last Medication intake:  Today   Pharmacokinetics: Liberation and absorption (onset of action): WNL Distribution (time to peak effect):  WNL Metabolism and excretion (duration of action): WNL  Pharmacodynamics: Desired effects: Analgesia: Derek [name] reports >50% benefit. Functional ability: Patient reports that medication allows him to accomplish basic ADLs Clinically meaningful improvement in function (CMIF): Sustained CMIF goals met Perceived effectiveness: Described as relatively effective, allowing for increase in activities of daily living (ADL) Undesirable effects: Side-effects or Adverse reactions: None reported Monitoring: [place] PMP: Online review of the past 54-monthperiod conducted. Compliant with practice rules and regulations Last UDS on record: Summary  Date Value Ref Range Status  01/24/2018 FINAL  Final    Comment:    ==================================================================== TOX[facility] COMP DRUG ANALYSIS,UR ==================================================================== Test                             Result       Flag       Units Drug Present and Declared for Prescription Verification   Oxycodone                      1585         EXPECTED   ng/mg creat   Oxymorphone                    3751         EXPECTED   ng/mg creat   Noroxycodone                   2191         EXPECTED   ng/mg creat   Noroxymorphone                 431          EXPECTED   ng/mg creat    Sources of oxycodone are scheduled prescription medications.    Oxymorphone, noroxycodone, and noroxymorphone are expected    metabolites of oxycodone. Oxymorphone is also available as a    scheduled prescription medication.   Acetaminophen                  PRESENT      EXPECTED Drug Absent but Declared for Prescription Verification   Cyclobenzaprine                Not Detected UNEXPECTED   Tizanidine                     Not Detected UNEXPECTED    Tizanidine, as indicated in the declared medication list, is not    always detected even when used as  directed. ==================================================================== Test                      Result    Flag   Units      Ref Range   Creatinine              110              mg/dL      >=20 ==================================================================== Declared Medications:  The flagging and interpretation on this report are based on the  following declared medications.  Unexpected results may arise from  inaccuracies in the declared medications.  **Note: The testing scope of this panel includes these medications:  Cyclobenzaprine  Oxycodone (Oxycodone Acetaminophen)  **Note: The testing scope of this panel does not include small to  moderate amounts of these reported medications:  Acetaminophen (Oxycodone Acetaminophen)  Tizanidine ==================================================================== For clinical consultation, please call ([phone] ====================================================================    UDS interpretation: Compliant  Medication Assessment Form: Reviewed. Patient indicates being compliant with therapy Treatment compliance: Compliant Risk Assessment Profile: Aberrant behavior: See prior evaluations. None observed or detected today Comorbid factors increasing risk of overdose: See prior notes. No additional risks detected today Opioid risk tool (ORT) (Total Score):   Personal History of Substance Abuse (SUD-Substance use disorder):  Alcohol:    Illegal Drugs:    Rx Drugs:    ORT Risk Level calculation:   Risk of substance use disorder (SUD): Low  ORT Scoring interpretation table:  Score <3 = Low Risk for SUD  Score between 4-7 = Moderate Risk for SUD  Score >8 = High Risk for Opioid Abuse   Risk Mitigation Strategies:  Patient Counseling: Covered Patient-Prescriber Agreement (PPA): Present and active  Notification to other healthcare providers: Done  Pharmacologic Plan: No change in therapy, at this time.              Post-Procedure Assessment  In 09/22/2017 procedure: sacrococcygeal Racz procedure Pre-procedure pain score:  2/10 Post-procedure pain score: 0/10         Influential Factors: BMI: 25.83 kg/m Intra-procedural challenges: None observed.         Assessment challenges: None detected.              Reported side-effects: None.        Post-procedural adverse reactions or complications: None reported         Sedation: Please see nurses note. When no sedatives are used, the analgesic levels obtained are directly associated to the effectiveness of the local anesthetics. However, when sedation is provided, the level of analgesia obtained during the initial 1 hour following the intervention, is believed to be the result of a combination of factors. These factors may include, but are not limited to: 1. The effectiveness of the local anesthetics used. 2. The effects of the analgesic(s) and/or anxiolytic(s) used. 3. The degree of discomfort experienced by the patient at the time of the procedure. 4. The patients ability and reliability in recalling and recording the events. 5. The presence and influence of possible secondary gains and/or psychosocial factors. Reported result: Relief experienced during the 1st hour after the procedure: 100 % (Ultra-Short Term Relief)            Interpretative annotation: Clinically appropriate result. Analgesia during this period is likely to be Local Anesthetic and/or IV Sedative (Analgesic/Anxiolytic) related.          Effects of local anesthetic: The analgesic effects attained during this period are directly associated to the localized infiltration of local anesthetics and therefore cary significant diagnostic value as to the etiological location, or anatomical origin, of the pain. Expected duration of relief is directly dependent on the pharmacodynamics of the local anesthetic used. Long-acting (4-6 hours) anesthetics used.  Reported result: Relief during the next 4  to 6 hour after the procedure: 100 % (Short-Term Relief)            Interpretative annotation: Clinically appropriate result. Analgesia during this period is likely to be Local Anesthetic-related.          Long-term benefit: Defined as the period of time past the expected duration of local anesthetics (1 hour for short-acting and 4-6 hours for long-acting). With the possible exception of prolonged sympathetic blockade from the local anesthetics, benefits during this period are typically attributed to, or associated with, other factors such as analgesic sensory neuropraxia, antiinflammatory effects, or beneficial biochemical changes provided by agents other than the local anesthetics.  Reported result: Extended relief following procedure: 50 % (Long-Term Relief)            Interpretative annotation: Clinically possible results. Good relief. No permanent benefit expected. Inflammation plays a part in the etiology to the pain.          Current benefits: Defined as reported results that persistent at this point in time.   Analgesia: >50 %            Function: Somewhat improved ROM: Somewhat improved Interpretative annotation: Good relief.    Effective therapeutic approach.          Interpretation: Results would suggest this therapy to be effective in the management of Mr. Staffa condition.                  Plan:  Set up procedure as a PRN palliative treatment option for this patient.                Laboratory Chemistry  Inflammation Markers (CRP: Acute Phase) (ESR: Chronic Phase) Lab Results  Component Value Date   CRP 13 (H) 01/24/2018   ESRSEDRATE 8 01/24/2018                         Rheumatology Markers No results found for: RF, ANA, LABURIC, URICUR, LYMEIGGIGMAB, LYMEABIGMQN, HLAB27                      Renal Function Markers Lab Results  Component Value Date   BUN 8 01/24/2018   CREATININE 1.02 01/24/2018   BCR 8 (L) 01/24/2018   GFRAA 107 01/24/2018   GFRNONAA 93 01/24/2018                              Hepatic Function Markers Lab Results  Component Value Date   AST 22 01/24/2018   ALBUMIN 4.4 01/24/2018   ALKPHOS 101 01/24/2018                        Electrolytes Lab Results  Component Value Date   NA 144 01/24/2018   K 4.1 01/24/2018   CL 104 01/24/2018   CALCIUM 9.9 01/24/2018   MG 2.1 01/24/2018                        Neuropathy Markers Lab Results  Component Value Date   VITAMINB12 462 01/24/2018                        CNS Tests No results found for: COLORCSF, APPEARCSF, RBCCOUNTCSF, WBCCSF, POLYSCSF, LYMPHSCSF, EOSCSF, PROTEINCSF, GLUCCSF, JCVIRUS, CSFOLI, IGGCSF                      Bone Pathology Markers Lab Results  Component Value Date   25OHVITD1 26 (L) 01/24/2018   25OHVITD2 <1.0 01/24/2018   25OHVITD3 26 01/24/2018                         Coagulation Parameters Lab Results  Component Value Date   PLT 248 02/21/2015                        Cardiovascular Markers Lab Results  Component Value Date   HGB 13.1 02/21/2015   HCT 39.9 02/21/2015  CA Markers No results found for: CEA, CA125, LABCA2                      Note: Lab results reviewed.  Recent Diagnostic Imaging Results  DG C-Arm 1-60 Min-No Report Fluoroscopy was utilized by the requesting physician.  No radiographic  interpretation.   Complexity Note: Imaging results reviewed. Results shared with Mr. Legler, using Layman's terms.                         Meds   Current Outpatient Medications:  .  Calcium Carbonate-Vit D-Min (GNP CALCIUM 1200) 1200-1000 MG-UNIT CHEW, Chew 1,200 mg by mouth daily with breakfast. Take in combination with vitamin D and magnesium., Disp: 30 tablet, Rfl: 5 .  Cholecalciferol (VITAMIN D3) 5000 units CAPS, Take 1 capsule (5,000 Units total) by mouth daily with breakfast. Take along with calcium and magnesium., Disp: 30 capsule, Rfl: 5 .  cyclobenzaprine (FLEXERIL) 10 MG tablet, Take 1 tablet (10 mg total) by  mouth at bedtime., Disp: 30 tablet, Rfl: 2 .  Magnesium 500 MG CAPS, Take 1 capsule (500 mg total) by mouth 2 (two) times daily at 8 am and 10 pm., Disp: 60 capsule, Rfl: 5 .  [START ON 05/01/2018] oxyCODONE-acetaminophen (PERCOCET) 10-325 MG tablet, Take 1 tablet by mouth every 6 (six) hours as needed for pain., Disp: 100 tablet, Rfl: 0 .  tiZANidine (ZANAFLEX) 4 MG tablet, Take 1 tablet (4 mg total) by mouth 2 (two) times daily as needed for muscle spasms (For muscle pain/spasm during the day.)., Disp: 60 tablet, Rfl: 2  ROS  Constitutional: Denies any fever or chills Gastrointestinal: No reported hemesis, hematochezia, vomiting, or acute GI distress Musculoskeletal: Denies any acute onset joint swelling, redness, loss of ROM, or weakness Neurological: No reported episodes of acute onset apraxia, aphasia, dysarthria, agnosia, amnesia, paralysis, loss of coordination, or loss of consciousness  Allergies  Mr. Buckbee is allergic to amoxicillin and vicodin [hydrocodone-acetaminophen].  [place]  Drug: Mr. Mendez  reports that he does not use drugs. Alcohol:  reports that he drinks alcohol. Tobacco:  reports that he has been smoking. He has a 16.00 pack-year smoking history. He has never used smokeless tobacco. Medical:  has a past medical history of Anxiety, Arthritis, and GERD (gastroesophageal reflux disease). Surgical: Mr. Wickens  has a past surgical history that includes Back surgery; HAND REIMPLANTED; and Lumbar fusion (11/14). Family: family history is not on file.  Constitutional Exam  General appearance: Well nourished, well developed, and well hydrated. In no apparent acute distress Vitals:   04/21/18 1047  BP: 139/88  Pulse: (!) 117  Resp: 16  Temp: 98.6 F (37 C)  TempSrc: Oral  SpO2: 98%  Weight: 180 lb (81.6 kg)  Height: _0  (1.778 m)  Psych/Mental status: Alert, oriented x 3 (person, place, & time)       Eyes: PERLA Respiratory: No evidence of acute respiratory  distress  Thoracic Spine Area Exam  Skin & Axial Inspection: No masses, redness, or swelling Alignment: Symmetrical Functional ROM: Unrestricted ROM Stability: No instability detected Muscle Tone/Strength: Functionally intact. No obvious neuro-muscular anomalies detected. Sensory (Neurological): Unimpaired Muscle strength & Tone: Tender  Lumbar Spine Area Exam  Skin & Axial Inspection: No masses, redness, or swelling Alignment: Symmetrical Functional ROM: Unrestricted ROM       Stability: No instability detected Muscle Tone/Strength: Functionally intact. No obvious neuro-muscular anomalies detected. Sensory (Neurological): Unimpaired Palpation: No palpable anomalies  Gait & Posture Assessment  Ambulation: Unassisted Gait: Relatively normal for age and body habitus Posture: WNL   Lower Extremity Exam    Side: Right lower extremity  Side: Left lower extremity  Stability: No instability observed          Stability: No instability observed          Skin & Extremity Inspection: Skin color, temperature, and hair growth are WNL. No peripheral edema or cyanosis. No masses, redness, swelling, asymmetry, or associated skin lesions. No contractures.  Skin & Extremity Inspection: Skin color, temperature, and hair growth are WNL. No peripheral edema or cyanosis. No masses, redness, swelling, asymmetry, or associated skin lesions. No contractures.  Functional ROM: Unrestricted ROM                  Functional ROM: Unrestricted ROM                  Muscle Tone/Strength: Functionally intact. No obvious neuro-muscular anomalies detected.  Muscle Tone/Strength: Functionally intact. No obvious neuro-muscular anomalies detected.  Sensory (Neurological): Unimpaired  Sensory (Neurological): Unimpaired  Palpation: No palpable anomalies  Palpation: No palpable anomalies   Assessment  Primary Diagnosis & Pertinent Problem List: The primary encounter diagnosis was Spondylosis without myelopathy or  radiculopathy, lumbosacral region. Diagnoses of DDD (degenerative disc disease), lumbosacral, Lumbar facet syndrome (Bilateral), Chronic pain syndrome, and Long term prescription opiate use were also pertinent to this visit.  Status Diagnosis  Controlled Controlled Controlled 1. Spondylosis without myelopathy or radiculopathy, lumbosacral region   2. DDD (degenerative disc disease), lumbosacral   3. Lumbar facet syndrome (Bilateral)   4. Chronic pain syndrome   5. Long term prescription opiate use     Problems updated and reviewed during this visit: No problems updated. Plan of Care  Pharmacotherapy (Medications Ordered): Meds ordered this encounter  Medications  . oxyCODONE-acetaminophen (PERCOCET) 10-325 MG tablet    Sig: Take 1 tablet by mouth every 6 (six) hours as needed for pain.    Dispense:  100 tablet    Refill:  0    Do not add this medication to the electronic "Automatic Refill" notification system. Patient may have prescription filled one day early if pharmacy is closed on scheduled refill date.    Order Specific Question:   Supervising Provider    Answer:   Milinda Pointer [[id number]]   New Prescriptions   No medications on file   Medications administered today: [name] had no medications administered during this visit. Lab-work, procedure(s), and/or referral(s): Orders Placed This Encounter  Procedures  . ToxASSURE Select 13 (MW), Urine   Imaging and/or referral(s): None  Interventional therapies: Planned, scheduled, and/or pending:   Not at this time.   Possible POC: Diagnostic bilateral lumbar facet block Possible bilateral lumbar facet RFA Diagnostic bilateral sacroiliac joint block Possible bilateral sacroiliac joint RFA Possible spinal cord stimulator trial   Palliative PRN treatment(s): Therapeutic Racz procedure #2(no sooner than 6 months from now)   Provider-requested follow-up: Return in about 4 weeks (around 05/19/2018)  for MedMgmt.  Future Appointments  Date Time Provider [place]  05/17/2018  8:00 AM Vevelyn Francois, NP [facility] None   Primary Care Physician: Jodi Marble, MD Location: [facility] Note by: Vevelyn Francois NP Date: 04/21/2018; Time: 4:14 PM  Pain Score Disclaimer: We use the NRS-11 scale. This is a self-reported, subjective measurement of pain severity with only modest accuracy. It is used primarily to identify changes within  a particular patient. It must be understood that outpatient pain scales are significantly less accurate that those used for research, where they can be applied under ideal controlled circumstances with minimal exposure to variables. In reality, the score is likely to be a combination of pain intensity and pain affect, where pain affect describes the degree of emotional arousal or changes in action readiness caused by the sensory experience of pain. Factors such as social and work situation, setting, emotional state, anxiety levels, expectation, and prior pain experience may influence pain perception and show large inter-individual differences that may also be affected by time variables.  Patient instructions provided during this appointment: Patient Instructions  ____________________________________________________________________________________________  Medication Rules  Applies to: All patients receiving prescriptions (written or electronic).  Pharmacy of record: Pharmacy where electronic prescriptions will be sent. If written prescriptions are taken to a different pharmacy, please inform the nursing staff. The pharmacy listed in the electronic medical record should be the one where you would like electronic prescriptions to be sent.  Prescription refills: Only during scheduled appointments. Applies to both, written and electronic prescriptions.  NOTE: The following applies primarily to controlled substances (Opioid*  Pain Medications).   Patient's responsibilities: 1. Pain Pills: Bring all pain pills to every appointment (except for procedure appointments). 2. Pill Bottles: Bring pills in original pharmacy bottle. Always bring newest bottle. Bring bottle, even if empty. 3. Medication refills: You are responsible for knowing and keeping track of what medications you need refilled. The day before your appointment, write a list of all prescriptions that need to be refilled. Bring that list to your appointment and give it to the admitting nurse. Prescriptions will be written only during appointments. If you forget a medication, it will not be "Called in", "Faxed", or "electronically sent". You will need to get another appointment to get these prescribed. 4. Prescription Accuracy: You are responsible for carefully inspecting your prescriptions before leaving our office. Have the discharge nurse carefully go over each prescription with you, before taking them home. Make sure that your name is accurately spelled, that your address is correct. Check the name and dose of your medication to make sure it is accurate. Check the number of pills, and the written instructions to make sure they are clear and accurate. Make sure that you are given enough medication to last until your next medication refill appointment. 5. Taking Medication: Take medication as prescribed. Never take more pills than instructed. Never take medication more frequently than prescribed. Taking less pills or less frequently is permitted and encouraged, when it comes to controlled substances (written prescriptions).  6. Inform other Doctors: Always inform, all of your healthcare providers, of all the medications you take. 7. Pain Medication from other Providers: You are not allowed to accept any additional pain medication from any other Doctor or Healthcare provider. There are two exceptions to this rule. (see below) In the event that you require additional pain  medication, you are responsible for notifying us, as stated below. 8. Medication Agreement: You are responsible for carefully reading and following our Medication Agreement. This must be signed before receiving any prescriptions from our practice. Safely store a copy of your signed Agreement. Violations to the Agreement will result in no further prescriptions. (Additional copies of our Medication Agreement are available upon request.) 9. Laws, Rules, & Regulations: All patients are expected to follow all Federal and [employer], Rules, [employer]. Ignorance of the Laws does not constitute a valid excuse. The use of  any illegal substances is prohibited. 10. Adopted CDC guidelines & recommendations: Target dosing levels will be at or below 60 MME/day. Use of benzodiazepines** is not recommended.  Exceptions: There are only two exceptions to the rule of not receiving pain medications from other Healthcare Providers. 1. Exception #1 (Emergencies): In the event of an emergency (i.e.: accident requiring emergency care), you are allowed to receive additional pain medication. However, you are responsible for: As soon as you are able, call our office [phone], at any time of the day or night, and leave a message stating your name, the date and nature of the emergency, and the name and dose of the medication prescribed. In the event that your call is answered by a member of our staff, make sure to document and save the date, time, and the name of the person that took your information.  2. Exception #2 (Planned Surgery): In the event that you are scheduled by another doctor or dentist to have any type of surgery or procedure, you are allowed (for a period no longer than 30 days), to receive additional pain medication, for the acute post-op pain. However, in this case, you are responsible for picking up a copy of our "Post-op Pain Management for Surgeons" handout, and giving it to your surgeon or  dentist. This document is available at our office, and does not require an appointment to obtain it. Simply go to our office during business hours (Monday-Thursday from 8:00 AM to 4:00 PM) (Friday 8:00 AM to 12:00 Noon) or if you have a scheduled appointment with [place], prior to your surgery, and ask for it by name. In addition, you will need to provide [place] with your name, name of your surgeon, type of surgery, and date of procedure or surgery.  *Opioid medications include: morphine, codeine, oxycodone, oxymorphone, hydrocodone, hydromorphone, meperidine, tramadol, tapentadol, buprenorphine, fentanyl, methadone. **Benzodiazepine medications include: diazepam (Valium), alprazolam (Xanax), clonazepam (Klonopine), lorazepam (Ativan), clorazepate (Tranxene), chlordiazepoxide (Librium), estazolam (Prosom), oxazepam (Serax), temazepam (Restoril), triazolam (Halcion) (Last updated: 09/02/2017) ____________________________________________________________________________________________   Oxycodone was e-scribed to your pharmacy of choice.

## 2018-04-21 NOTE — Progress Notes (Signed)
Nursing Pain Medication Assessment:  Safety precautions to be maintained throughout the outpatient stay will include: orient to surroundings, keep bed in low position, maintain call bell within reach at all times, provide assistance with transfer out of bed and ambulation.  Medication Inspection Compliance: Pill count conducted under aseptic conditions, in front of the patient. Neither the pills nor the bottle was removed from the patient's sight at any time. Once count was completed pills were immediately returned to the patient in their original bottle.  Medication: Oxycodone/APAP Pill/Patch Count: 16 of 100 pills remain Pill/Patch Appearance: Markings consistent with prescribed medication Bottle Appearance: Standard pharmacy container. Clearly labeled. Filled Date: 09/27 / 2019 Last Medication intake:  Today

## 2018-04-25 ENCOUNTER — Telehealth: Payer: Self-pay | Admitting: Nurse Practitioner

## 2018-04-25 NOTE — Telephone Encounter (Signed)
Patient states his meds will not last until 10-27 taking 1 every 6 hours, [name] only wrote for 100 tabs to last until he saw C. Brooke Dare, he will be 5 days short. Please call patient and let him know what he needs to do. Current script is to be filled on 05-01-18

## 2018-04-25 NOTE — Telephone Encounter (Signed)
Pt was called back concerning his medication refill.  Appointment was made to see Dr.Naveria on Wednesday 04/27/18 because patient stated that 100 pills is not lasting for 30 days.

## 2018-04-25 NOTE — Telephone Encounter (Signed)
This is the amount that he will be getting each month. He can have up to 4 per day but his total month dose is 100. Any further discussion will be done with a apt with Dr Shauna Hugh.

## 2018-04-26 NOTE — Progress Notes (Signed)
Patient's Name: Derek [name]  MRN: [id number]  Referring Provider: Jodi Marble, MD  DOB: September 17, 1980  PCP: Jodi Marble, MD  DOS: 04/27/2018  Note by: Gaspar Cola, MD  Service setting: Ambulatory outpatient  Specialty: Interventional Pain Management  Location: [facility] (AMB) Pain Management Facility    Patient type: Established   Primary Reason(s) for Visit: Encounter for prescription drug management. (Level of risk: moderate)  CC: Back Pain (mid)  HPI  Derek [name] is a 37 y.o. year old, male patient, who comes today for a medication management evaluation. He has Lumbar pseudoarthrosis (L5-S1); Tobacco use; Chronic low back pain (Primary Area of Pain) (Bilateral) (L>R) w/ sciatica (Bilateral); Chronic lower extremity pain (Secondary Area of Pain) (Bilateral) (L>R); Chronic pain syndrome; Long term current use of opiate analgesic; Pharmacologic therapy; Disorder of skeletal system; Problems influencing health status; Elevated C-reactive protein (CRP); Vitamin D insufficiency; Failed back surgical syndrome (x3); L5-S1 pseudoarthrosis; Chronic musculoskeletal pain; Spasm of back muscles; Sacroiliac joint dysfunction (Bilateral); Chronic sacroiliac joint pain (Bilateral); Somatic dysfunction of sacroiliac joints (Bilateral); Chronic hip pain (Bilateral); Epidural fibrosis; Lumbar postlaminectomy syndrome; DDD (degenerative disc disease), lumbosacral; Lumbar facet syndrome (Bilateral); Other specified dorsopathies, sacral and sacrococcygeal region; Spondylosis without myelopathy or radiculopathy, lumbosacral region; and Neurogenic pain on their problem list. His primarily concern today is the Back Pain (mid)  Pain Assessment: Location: Mid Back Radiating: denies Onset: More than a month ago Duration: Chronic pain Quality: Aching, Constant, Dull Severity: 1 /10 (subjective, self-reported pain score)  Note: Reported level is compatible with observation.                         When using  our objective Pain Scale, levels between 6 and 10/10 are said to belong in an emergency room, as it progressively worsens from a 6/10, described as severely limiting, requiring emergency care not usually available at an outpatient pain management facility. At a 6/10 level, communication becomes difficult and requires great effort. Assistance to reach the emergency department may be required. Facial flushing and profuse sweating along with potentially dangerous increases in heart rate and blood pressure will be evident. Timing: Constant Modifying factors: medications,heat, ice, procedure BP: (!) 156/59  HR: 78  Derek [name] was last scheduled for an appointment on 04/07/2018 for medication management. During today's appointment we reviewed Derek [name] chronic pain status, as well as his outpatient medication regimen.  The patient comes into the clinic today indicating that we had written his prescription every 6 hours and that is how he took it and it did not last 30 days.  Today I have clarified for the patient that PRN means that he should not be taking the medication around the clock.  I explained to the patient that the reason why had not given him the 120 tablets is because he is taking Percocet which contains Tylenol and I wanted to minimize his exposure to it and therefore minimize the possibility of hepatotoxicity.  In any case, today we have decided after having gone in great detail over the long-term side effects of the opioids and APAP, that we will be switching to oxycodone IR without APAP.  He will be given 120 tablets/month but I made it clear that we will never go up on the dose or the amount.  I also made it clear that if he feels that the medicine is not working as it used to then he should be doing some "drug holidays".  I have provided the patient today with written information on the drug holidays and clear explanations on how to accomplish those.  I did review his case and because all of his  imaging work is from 2016, I will be ordering a CT of the lumbar spine to evaluate for any possible causes of his persistent low back pain and leg pain.  He does indicate having attained excellent relief of the pain from the Racz procedure where he is no longer experiencing the leg pain.  He is still having persistent low back pain and he wants to know if there is anything that we can do for that.  I will be reviewing the images of the CT for that reason.  I will have him come back in 1 month to go over that information and to see how he is doing with the new medication regimen.  The patient  reports that he does not use drugs. His body mass index is 25.83 kg/m.  Further details on both, my assessment(s), as well as the proposed treatment plan, please see below.  Controlled Substance Pharmacotherapy Assessment REMS (Risk Evaluation and Mitigation Strategy)  Analgesic: Oxycodone/acetaminophen 10/325 mg oxycodone 40 mg/day MME/day:15m/day SHart Rochester RN  04/27/2018  8:17 AM  Sign at close encounter Nursing Pain Medication Assessment:  Safety precautions to be maintained throughout the outpatient stay will include: orient to surroundings, keep bed in low position, maintain call bell within reach at all times, provide assistance with transfer out of bed and ambulation.  Medication Inspection Compliance: Pill count conducted under aseptic conditions, in front of the patient. Neither the pills nor the bottle was removed from the patient's sight at any time. Once count was completed pills were immediately returned to the patient in their original bottle.  Medication: Oxycodone IR Pill/Patch Count: 0 of 100 pills remain Pill/Patch Appearance: Markings consistent with prescribed medication Bottle Appearance: Standard pharmacy container. Clearly labeled. Filled Date: 09 / 27 / 2019 Last Medication intake:  Yesterday   Pharmacokinetics: Liberation and absorption (onset of action):  WNL Distribution (time to peak effect): WNL Metabolism and excretion (duration of action): WNL         Pharmacodynamics: Desired effects: Analgesia: Derek [name] >50% benefit. Functional ability: Patient reports that medication allows him to accomplish basic ADLs Clinically meaningful improvement in function (CMIF): Sustained CMIF goals met Perceived effectiveness: Described as relatively effective, allowing for increase in activities of daily living (ADL) Undesirable effects: Side-effects or Adverse reactions: None reported Monitoring: [place] PMP: Online review of the past 167-montheriod conducted. Compliant with practice rules and regulations Last UDS on record: Summary  Date Value Ref Range Status  01/24/2018 FINAL  Final    Comment:    ==================================================================== [facility] COMP DRUG ANALYSIS,UR ==================================================================== Test                             Result       Flag       Units Drug Present and Declared for Prescription Verification   Oxycodone                      1585         EXPECTED   ng/mg creat   Oxymorphone                    3751         EXPECTED   ng/mg creat  Noroxycodone                   2191         EXPECTED   ng/mg creat   Noroxymorphone                 431          EXPECTED   ng/mg creat    Sources of oxycodone are scheduled prescription medications.    Oxymorphone, noroxycodone, and noroxymorphone are expected    metabolites of oxycodone. Oxymorphone is also available as a    scheduled prescription medication.   Acetaminophen                  PRESENT      EXPECTED Drug Absent but Declared for Prescription Verification   Cyclobenzaprine                Not Detected UNEXPECTED   Tizanidine                     Not Detected UNEXPECTED    Tizanidine, as indicated in the declared medication list, is not    always detected even when used as  directed. ==================================================================== Test                      Result    Flag   Units      Ref Range   Creatinine              110              mg/dL      >=20 ==================================================================== Declared Medications:  The flagging and interpretation on this report are based on the  following declared medications.  Unexpected results may arise from  inaccuracies in the declared medications.  **Note: The testing scope of this panel includes these medications:  Cyclobenzaprine  Oxycodone (Oxycodone Acetaminophen)  **Note: The testing scope of this panel does not include small to  moderate amounts of these reported medications:  Acetaminophen (Oxycodone Acetaminophen)  Tizanidine ==================================================================== For clinical consultation, please call [phone]. ====================================================================    UDS interpretation: Compliant          Medication Assessment Form: Reviewed. Patient indicates being compliant with therapy Treatment compliance: Compliant Risk Assessment Profile: Aberrant behavior: See prior evaluations. None observed or detected today Comorbid factors increasing risk of overdose: See prior notes. No additional risks detected today Opioid risk tool (ORT) (Total Score): 1 Personal History of Substance Abuse (SUD-Substance use disorder):  Alcohol: Negative  Illegal Drugs: Negative  Rx Drugs: Negative  ORT Risk Level calculation: Low Risk Risk of substance use disorder (SUD): Low Opioid Risk Tool - 04/27/18 0818      Family History of Substance Abuse   Alcohol  Negative    Illegal Drugs  Negative    Rx Drugs  Negative      Personal History of Substance Abuse   Alcohol  Negative    Illegal Drugs  Negative    Rx Drugs  Negative      Age   Age between 9-45 years   Yes      History of Preadolescent Sexual Abuse    History of Preadolescent Sexual Abuse  Negative or Male      Psychological Disease   Psychological Disease  Negative    Depression  Negative      Total Score   Opioid Risk Tool Scoring  1  Opioid Risk Interpretation  Low Risk      ORT Scoring interpretation table:  Score <3 = Low Risk for SUD  Score between 4-7 = Moderate Risk for SUD  Score >8 = High Risk for Opioid Abuse   Risk Mitigation Strategies:  Patient Counseling: Covered Patient-Prescriber Agreement (PPA): Present and active  Notification to other healthcare providers: Done  Pharmacologic Plan: No change in therapy, at this time.             Laboratory Chemistry  Inflammation Markers (CRP: Acute Phase) (ESR: Chronic Phase) Lab Results  Component Value Date   CRP 13 (H) 01/24/2018   ESRSEDRATE 8 01/24/2018                         Rheumatology Markers No results found.  Renal Function Markers Lab Results  Component Value Date   BUN 8 01/24/2018   CREATININE 1.02 01/24/2018   BCR 8 (L) 01/24/2018   GFRAA 107 01/24/2018   GFRNONAA 93 01/24/2018                             Hepatic Function Markers Lab Results  Component Value Date   AST 22 01/24/2018   ALBUMIN 4.4 01/24/2018   ALKPHOS 101 01/24/2018                        Electrolytes Lab Results  Component Value Date   NA 144 01/24/2018   K 4.1 01/24/2018   CL 104 01/24/2018   CALCIUM 9.9 01/24/2018   MG 2.1 01/24/2018                        Neuropathy Markers Lab Results  Component Value Date   VITAMINB12 462 01/24/2018                        CNS Tests No results found.  Bone Pathology Markers Lab Results  Component Value Date   25OHVITD1 26 (L) 01/24/2018   25OHVITD2 <1.0 01/24/2018   25OHVITD3 26 01/24/2018                         Coagulation Parameters Lab Results  Component Value Date   PLT 248 02/21/2015                        Cardiovascular Markers Lab Results  Component Value Date   HGB 13.1 02/21/2015   HCT  39.9 02/21/2015                         CA Markers No results found.  Note: Lab results reviewed.  Recent Diagnostic Imaging Review  Lumbosacral Imaging: Lumbar CT wo contrast:  Results for orders placed during the hospital encounter of 02/20/15  CT Lumbar Spine Wo Contrast   Narrative CLINICAL DATA:  Brain lab protocol. Preop spine to sacrum fusion. History of back pain with radicular symptoms in both legs.  EXAM: CT LUMBAR SPINE WITHOUT CONTRAST  TECHNIQUE: Multidetector CT imaging of the lumbar spine was performed without intravenous contrast administration. Multiplanar CT image reconstructions were also generated.  COMPARISON:  None.  FINDINGS: 1 mm contiguous images were obtained from L2 through the coccyx with patient prone in preparation for brain lab localization for surgical fusion. The  patient has undergone previous L4-S1 posterior and interbody fusion. L4-5 fusion is solid. There is a pseudarthrosis at L5-S1. No solid interbody or posterior arthrodesis. BILATERAL S1 screw loosening. Hardware appropriately placed. No worrisome paravertebral findings. Unremarkable sacroiliac joints. Grossly unremarkable visualized abdominal soft tissues.  IMPRESSION: Brain lab protocol in preparation for fusion revision. Solid L4-5 fusion. L5-S1 pseudarthrosis.   Electronically Signed   By: Staci Righter M.D.   On: 02/20/2015 08:26    Lumbar DG 1V:  Results for orders placed during the hospital encounter of 05/29/13  DG Lumbar Spine 1 View   Narrative CLINICAL DATA:  L4-L5 and L5-S1 lumbar surgery  EXAM: LUMBAR SPINE - 1 VIEW  COMPARISON:  MRI lumbar spine 11/06/2008  FINDINGS: Prior MRI labeled with 5 lumbar vertebrae.  Cross-table lateral view #1 at 0806 hr interpreted at 0623 hr.  Metallic probe via posterior approach projects dorsal to the L5-S1 disc space level.  Tissue spreaders and a surgical sponge are identified dorsal to L4 and  L5.  IMPRESSION: Dorsal localization of the L5-S1 disc space level.   Electronically Signed   By: Lavonia Dana M.D.   On: 05/29/2013 11:57    Lumbar DG 2-3 views:  Results for orders placed during the hospital encounter of 04/19/15  DG Lumbar Spine 2-3 Views   Narrative CLINICAL DATA:  Persistent lumbago for 1 day.  Prior spinal fusion.  EXAM: LUMBAR SPINE - 2-3 VIEW  COMPARISON:  CT lumbar spine February 20, 2015  FINDINGS: Frontal, lateral, and spot lumbosacral lateral images were obtained. There is pedicle screw and plate fixation at L4, L5, and S1. There are also screws transfixing each sacroiliac joint. The screw and plate fixation devices appear intact as do disc spacers at L4-5 and L5-S1. The positioning of the pedicle screws appears unchanged compared to prior CT examination.  There is no fracture or spondylolisthesis. Disc spaces appear unchanged ; the disc spaces superior to the postoperative areas appear unremarkable. No erosive change or bony destruction.  IMPRESSION: Postoperative change at multiple levels, stable. Note that there is now screw fixation to each sacroiliac joint. No fracture or spondylolisthesis. No erosive change or bony destruction apparent.   Electronically Signed   By: Lowella Grip III M.D.   On: 04/19/2015 14:51      Imaging from [place] [facility] on 04/07/18.  Complexity Note: Imaging results reviewed. Results shared with Derek [name], using Layman's terms.                         Meds   Current Outpatient Medications:  .  Calcium Carbonate-Vit D-Min (GNP CALCIUM 1200) 1200-1000 MG-UNIT CHEW, Chew 1,200 mg by mouth daily with breakfast. Take in combination with vitamin D and magnesium., Disp: 30 tablet, Rfl: 5 .  Cholecalciferol (VITAMIN D3) 5000 units CAPS, Take 1 capsule (5,000 Units total) by mouth daily with breakfast. Take along with calcium and magnesium., Disp: 30 capsule, Rfl: 5 .  cyclobenzaprine (FLEXERIL) 10 MG tablet,  Take 1 tablet (10 mg total) by mouth at bedtime., Disp: 30 tablet, Rfl: 2 .  Magnesium 500 MG CAPS, Take 1 capsule (500 mg total) by mouth 2 (two) times daily at 8 am and 10 pm., Disp: 60 capsule, Rfl: 5 .  tiZANidine (ZANAFLEX) 4 MG tablet, Take 1 tablet (4 mg total) by mouth 2 (two) times daily as needed for muscle spasms (For muscle pain/spasm during the day.)., Disp: 60 tablet, Rfl: 2 .  Oxycodone HCl 10  MG TABS, Take 1 tablet (10 mg total) by mouth every 6 (six) hours as needed (Take only if needed, not around-the-clock.). Must last 30 days., Disp: 120 tablet, Rfl: 0  ROS  Constitutional: Denies any fever or chills Gastrointestinal: No reported hemesis, hematochezia, vomiting, or acute GI distress Musculoskeletal: Denies any acute onset joint swelling, redness, loss of ROM, or weakness Neurological: No reported episodes of acute onset apraxia, aphasia, dysarthria, agnosia, amnesia, paralysis, loss of coordination, or loss of consciousness  Allergies  Derek [name] is allergic to amoxicillin and vicodin [hydrocodone-acetaminophen].  [place]  Drug: Derek [name]  reports that he does not use drugs. Alcohol:  reports that he drinks alcohol. Tobacco:  reports that he has been smoking. He has a 16.00 pack-year smoking history. He has never used smokeless tobacco. Medical:  has a past medical history of Anxiety, Arthritis, and GERD (gastroesophageal reflux disease). Surgical: Derek [name]  has a past surgical history that includes Back surgery; HAND REIMPLANTED; and Lumbar fusion (11/14). Family: family history is not on file.  Constitutional Exam  General appearance: Well nourished, well developed, and well hydrated. In no apparent acute distress Vitals:   04/27/18 0813 04/27/18 0945  BP: (!) 150/95 (!) 156/59  Pulse: (!) 111 78  Resp: 18 18  Temp: 98.2 F (36.8 C)   TempSrc: Oral   SpO2: 99% 100%  Weight: 180 lb (81.6 kg)   Height: 5' 10" (1.778 m)    BMI Assessment: Estimated body mass index  is 25.83 kg/m as calculated from the following:   Height as of this encounter: 5' 10" (1.778 m).   Weight as of this encounter: 180 lb (81.6 kg).  BMI interpretation table: BMI level Category Range association with higher incidence of chronic pain  <18 kg/m2 Underweight   18.5-24.9 kg/m2 Ideal body weight   25-29.9 kg/m2 Overweight Increased incidence by 20%  30-34.9 kg/m2 Obese (Class I) Increased incidence by 68%  35-39.9 kg/m2 Severe obesity (Class II) Increased incidence by 136%  >40 kg/m2 Extreme obesity (Class III) Increased incidence by 254%   Patient's current BMI Ideal Body weight  Body mass index is 25.83 kg/m. Ideal body weight: 73 kg (160 lb 15 oz) Adjusted ideal body weight: 76.5 kg (168 lb 9 oz)   BMI Readings from Last 4 Encounters:  04/27/18 25.83 kg/m  04/21/18 25.83 kg/m  04/07/18 25.83 kg/m  03/21/18 25.83 kg/m   Wt Readings from Last 4 Encounters:  04/27/18 180 lb (81.6 kg)  04/21/18 180 lb (81.6 kg)  04/07/18 180 lb (81.6 kg)  03/21/18 180 lb (81.6 kg)  Psych/Mental status: Alert, oriented x 3 (person, place, & time)       Eyes: PERLA Respiratory: No evidence of acute respiratory distress  Cervical Spine Area Exam  Skin & Axial Inspection: No masses, redness, edema, swelling, or associated skin lesions Alignment: Symmetrical Functional ROM: Unrestricted ROM      Stability: No instability detected Muscle Tone/Strength: Functionally intact. No obvious neuro-muscular anomalies detected. Sensory (Neurological): Unimpaired Palpation: No palpable anomalies              Upper Extremity (UE) Exam    Side: Right upper extremity  Side: Left upper extremity  Skin & Extremity Inspection: Skin color, temperature, and hair growth are WNL. No peripheral edema or cyanosis. No masses, redness, swelling, asymmetry, or associated skin lesions. No contractures.  Skin & Extremity Inspection: Skin color, temperature, and hair growth are WNL. No peripheral edema or  cyanosis. No masses, redness, swelling,  asymmetry, or associated skin lesions. No contractures.  Functional ROM: Unrestricted ROM          Functional ROM: Unrestricted ROM          Muscle Tone/Strength: Functionally intact. No obvious neuro-muscular anomalies detected.  Muscle Tone/Strength: Functionally intact. No obvious neuro-muscular anomalies detected.  Sensory (Neurological): Unimpaired          Sensory (Neurological): Unimpaired          Palpation: No palpable anomalies              Palpation: No palpable anomalies              Provocative Test(s):  Phalen's test: deferred Tinel's test: deferred Apley's scratch test (touch opposite shoulder):  Action 1 (Across chest): deferred Action 2 (Overhead): deferred Action 3 (LB reach): deferred   Provocative Test(s):  Phalen's test: deferred Tinel's test: deferred Apley's scratch test (touch opposite shoulder):  Action 1 (Across chest): deferred Action 2 (Overhead): deferred Action 3 (LB reach): deferred    Thoracic Spine Area Exam  Skin & Axial Inspection: No masses, redness, or swelling Alignment: Symmetrical Functional ROM: Unrestricted ROM Stability: No instability detected Muscle Tone/Strength: Functionally intact. No obvious neuro-muscular anomalies detected. Sensory (Neurological): Unimpaired Muscle strength & Tone: No palpable anomalies  Lumbar Spine Area Exam  Skin & Axial Inspection: Well healed scar from previous spine surgery detected Alignment: Symmetrical Functional ROM: Mechanically restricted ROM       Stability: No instability detected Muscle Tone/Strength: Functionally intact. No obvious neuro-muscular anomalies detected. Sensory (Neurological): Unimpaired Palpation: No palpable anomalies       Provocative Tests: Hyperextension/rotation test: deferred today       Lumbar quadrant test (Kemp's test): deferred today       Lateral bending test: deferred today       Patrick's Maneuver: deferred today                    FABER test: deferred today                   S-I anterior distraction/compression test: deferred today         S-I lateral compression test: deferred today         S-I Thigh-thrust test: deferred today         S-I Gaenslen's test: deferred today          Gait & Posture Assessment  Ambulation: Unassisted Gait: Relatively normal for age and body habitus Posture: WNL   Lower Extremity Exam    Side: Right lower extremity  Side: Left lower extremity  Stability: No instability observed          Stability: No instability observed          Skin & Extremity Inspection: Skin color, temperature, and hair growth are WNL. No peripheral edema or cyanosis. No masses, redness, swelling, asymmetry, or associated skin lesions. No contractures.  Skin & Extremity Inspection: Skin color, temperature, and hair growth are WNL. No peripheral edema or cyanosis. No masses, redness, swelling, asymmetry, or associated skin lesions. No contractures.  Functional ROM: Unrestricted ROM                  Functional ROM: Unrestricted ROM                  Muscle Tone/Strength: Functionally intact. No obvious neuro-muscular anomalies detected.  Muscle Tone/Strength: Functionally intact. No obvious neuro-muscular anomalies detected.  Sensory (Neurological): Unimpaired  Sensory (Neurological):  Unimpaired  Palpation: No palpable anomalies  Palpation: No palpable anomalies   Assessment  Primary Diagnosis & Pertinent Problem List: The primary encounter diagnosis was Chronic low back pain (Primary Area of Pain) (Bilateral) (L>R) w/ sciatica (Bilateral). Diagnoses of Chronic lower extremity pain (Secondary Area of Pain) (Bilateral) (L>R), Failed back surgical syndrome (x3), Epidural fibrosis, Encounter for other specified surgical aftercare, DDD (degenerative disc disease), lumbosacral, Lumbar postlaminectomy syndrome, Lumbar pseudoarthrosis (L5-S1), Spasm of back muscles, L5-S1 pseudoarthrosis, and Chronic pain syndrome were also  pertinent to this visit.  Status Diagnosis  Controlled Controlled Controlled 1. Chronic low back pain (Primary Area of Pain) (Bilateral) (L>R) w/ sciatica (Bilateral)   2. Chronic lower extremity pain (Secondary Area of Pain) (Bilateral) (L>R)   3. Failed back surgical syndrome (x3)   4. Epidural fibrosis   5. Encounter for other specified surgical aftercare   6. DDD (degenerative disc disease), lumbosacral   7. Lumbar postlaminectomy syndrome   8. Lumbar pseudoarthrosis (L5-S1)   9. Spasm of back muscles   10. L5-S1 pseudoarthrosis   11. Chronic pain syndrome     Problems updated and reviewed during this visit: No problems updated. Plan of Care  Pharmacotherapy (Medications Ordered): Meds ordered this encounter  Medications  . Oxycodone HCl 10 MG TABS    Sig: Take 1 tablet (10 mg total) by mouth every 6 (six) hours as needed (Take only if needed, not around-the-clock.). Must last 30 days.    Dispense:  120 tablet    Refill:  0    [place] STOP ACT - Not applicable. Fill one day early if pharmacy is closed on scheduled refill date. Must last 30 days. To be filled on: 04/27/18. Must last until: 05/27/18   Medications administered today: Derek [name] had no medications administered during this visit.  Procedure Orders    No procedure(s) ordered today   Lab Orders  No laboratory test(s) ordered today    Imaging Orders     CT LUMBAR SPINE WO CONTRAST Referral Orders  No referral(s) requested today   Interventional management options: Planned, scheduled, and/or pending:   CT of the lumbar spine to evaluate the area for possible causes of his persistent low back pain and leg pain. Today I have adjusted his medications to oxycodone without APAP, 10 mg, to be taken every 6 hours PRN (but not around-the-clock).   Considering:   Diagnostic bilateral lumbar facet block Possible bilateral lumbar facet RFA Diagnostic bilateral sacroiliac joint block Possible bilateral  sacroiliac joint RFA Possible spinal cord stimulator trial   Palliative PRN treatment(s):   Therapeutic Racz procedure #2(no sooner than 6 months from 04/07/18)   Provider-requested follow-up: Return in about 1 month (around 05/28/2018) for Med-Mgmt, w/ Dr. Dossie Arbour, F/U eval (after test completion).  Future Appointments  Date Time Provider [place]  05/25/2018  8:30 AM Milinda Pointer, MD [facility] None   Primary Care Physician: Jodi Marble, MD Location: [facility] Note by: Gaspar Cola, MD Date: 04/27/2018; Time: 11:49 AM

## 2018-04-27 ENCOUNTER — Other Ambulatory Visit: Payer: Self-pay

## 2018-04-27 ENCOUNTER — Encounter: Payer: Self-pay | Admitting: Pain Medicine

## 2018-04-27 ENCOUNTER — Ambulatory Visit: Payer: BLUE CROSS/BLUE SHIELD | Attending: Pain Medicine | Admitting: Pain Medicine

## 2018-04-27 ENCOUNTER — Telehealth: Payer: Self-pay

## 2018-04-27 VITALS — BP 156/59 | HR 78 | Temp 98.2°F | Resp 18 | Ht 70.0 in | Wt 180.0 lb

## 2018-04-27 DIAGNOSIS — G96198 Other disorders of meninges, not elsewhere classified: Secondary | ICD-10-CM

## 2018-04-27 DIAGNOSIS — R7982 Elevated C-reactive protein (CRP): Secondary | ICD-10-CM | POA: Diagnosis not present

## 2018-04-27 DIAGNOSIS — Z79899 Other long term (current) drug therapy: Secondary | ICD-10-CM | POA: Diagnosis not present

## 2018-04-27 DIAGNOSIS — M47817 Spondylosis without myelopathy or radiculopathy, lumbosacral region: Secondary | ICD-10-CM | POA: Diagnosis not present

## 2018-04-27 DIAGNOSIS — M961 Postlaminectomy syndrome, not elsewhere classified: Secondary | ICD-10-CM | POA: Diagnosis not present

## 2018-04-27 DIAGNOSIS — M62830 Muscle spasm of back: Secondary | ICD-10-CM | POA: Diagnosis not present

## 2018-04-27 DIAGNOSIS — G894 Chronic pain syndrome: Secondary | ICD-10-CM | POA: Diagnosis not present

## 2018-04-27 DIAGNOSIS — M5442 Lumbago with sciatica, left side: Secondary | ICD-10-CM

## 2018-04-27 DIAGNOSIS — M533 Sacrococcygeal disorders, not elsewhere classified: Secondary | ICD-10-CM | POA: Insufficient documentation

## 2018-04-27 DIAGNOSIS — Z79891 Long term (current) use of opiate analgesic: Secondary | ICD-10-CM | POA: Diagnosis not present

## 2018-04-27 DIAGNOSIS — Z880 Allergy status to penicillin: Secondary | ICD-10-CM | POA: Diagnosis not present

## 2018-04-27 DIAGNOSIS — M546 Pain in thoracic spine: Secondary | ICD-10-CM | POA: Diagnosis present

## 2018-04-27 DIAGNOSIS — M79604 Pain in right leg: Secondary | ICD-10-CM | POA: Insufficient documentation

## 2018-04-27 DIAGNOSIS — M960 Pseudarthrosis after fusion or arthrodesis: Secondary | ICD-10-CM

## 2018-04-27 DIAGNOSIS — Z885 Allergy status to narcotic agent status: Secondary | ICD-10-CM | POA: Insufficient documentation

## 2018-04-27 DIAGNOSIS — G8929 Other chronic pain: Secondary | ICD-10-CM

## 2018-04-27 DIAGNOSIS — M7918 Myalgia, other site: Secondary | ICD-10-CM | POA: Diagnosis not present

## 2018-04-27 DIAGNOSIS — M51379 Other intervertebral disc degeneration, lumbosacral region without mention of lumbar back pain or lower extremity pain: Secondary | ICD-10-CM

## 2018-04-27 DIAGNOSIS — G9619 Other disorders of meninges, not elsewhere classified: Secondary | ICD-10-CM | POA: Diagnosis not present

## 2018-04-27 DIAGNOSIS — E559 Vitamin D deficiency, unspecified: Secondary | ICD-10-CM | POA: Diagnosis not present

## 2018-04-27 DIAGNOSIS — S32009K Unspecified fracture of unspecified lumbar vertebra, subsequent encounter for fracture with nonunion: Secondary | ICD-10-CM

## 2018-04-27 DIAGNOSIS — M79605 Pain in left leg: Secondary | ICD-10-CM | POA: Insufficient documentation

## 2018-04-27 DIAGNOSIS — M5441 Lumbago with sciatica, right side: Secondary | ICD-10-CM

## 2018-04-27 DIAGNOSIS — F17200 Nicotine dependence, unspecified, uncomplicated: Secondary | ICD-10-CM | POA: Diagnosis not present

## 2018-04-27 DIAGNOSIS — Z981 Arthrodesis status: Secondary | ICD-10-CM | POA: Insufficient documentation

## 2018-04-27 DIAGNOSIS — M9904 Segmental and somatic dysfunction of sacral region: Secondary | ICD-10-CM | POA: Diagnosis not present

## 2018-04-27 DIAGNOSIS — M5137 Other intervertebral disc degeneration, lumbosacral region: Secondary | ICD-10-CM | POA: Insufficient documentation

## 2018-04-27 DIAGNOSIS — Z4889 Encounter for other specified surgical aftercare: Secondary | ICD-10-CM | POA: Diagnosis not present

## 2018-04-27 LAB — TOXASSURE SELECT 13 (MW), URINE

## 2018-04-27 MED ORDER — OXYCODONE HCL 10 MG PO TABS: 10.0000 mg | ORAL_TABLET | Freq: Four times a day (QID) | ORAL | 0 refills | Status: DC | PRN

## 2018-04-27 NOTE — Telephone Encounter (Signed)
Called [facility] to cancel oxycodone/APAP 10/325 prescription to be filled on 05/01/18 per [name]. Spoke with Bradly Chris.

## 2018-04-27 NOTE — Patient Instructions (Addendum)
____________________________________________________________________________________________  Muscle Spasms & Cramps  Cause:  The most common cause of muscle spasms and cramps is vitamin and/or electrolyte (calcium, potassium, sodium, etc.) deficiencies.  Possible triggers: Sweating - causes loss of electrolytes thru the skin. Steroids - causes loss of electrolytes thru the urine.  Treatment: 1. Gatorade (or any other electrolyte-replenishing drink) - Take 1, 8 oz glass with each meal (3 times a day). 2. OTC (over-the-counter) Magnesium 400 to 500 mg - Take 1 tablet twice a day (one with breakfast and one before bedtime). If you have kidney problems, talk to your primary care physician before taking any Magnesium. 3. Tonic Water with quinine - Take 1, 8 oz glass before bedtime.   ____________________________________________________________________________________________   ____________________________________________________________________________________________  Drug Holidays (Slow)  What is a "Drug Holiday"? Drug Holiday: is the name given to the period of time during which a patient stops taking a medication(s) for the purpose of eliminating tolerance to the drug.  Benefits . Improved effectiveness of opioids. . Decreased opioid dose needed to achieve benefits. . Improved pain with lesser dose.  What is tolerance? Tolerance: is the progressive decreased in effectiveness of a drug due to its repetitive use. With repetitive use, the body gets use to the medication and as a consequence, it loses its effectiveness. This is a common problem seen with opioid pain medications. As a result, a larger dose of the drug is needed to achieve the same effect that used to be obtained with a smaller dose.  How long should a "Drug Holiday" last? At least 14 consecutive days. (2 weeks)  What are withdrawals? Withdrawals: refers to the wide range of symptoms that occur after stopping or  dramatically reducing opiate drugs after heavy and prolonged use. Withdrawal symptoms do not occur to patients that use low dose opioids, or those who take the medication sporadically. Contrary to benzodiazepine (example: Valium, Xanax, etc.) or alcohol withdrawals ("Delirium Tremens"), opioid withdrawals are not lethal. Withdrawals are the physical manifestation of the body getting rid of the excess receptors.  Expected Symptoms Early symptoms of withdrawal may include: . Agitation . Anxiety . Muscle aches . Increased tearing . Insomnia . Runny nose . Sweating . Yawning  Late symptoms of withdrawal may include: . Abdominal cramping . Diarrhea . Dilated pupils . Goose bumps . Nausea . Vomiting  Will I experience withdrawals? Due to the slow nature of the taper, it is very unlikely that you will experience any.  What is a slow taper? Taper: refers to the gradual decrease in dose. ___________________________________________________________________________________________ ____________________________________________________________________________________________  Medication Rules  Applies to: All patients receiving prescriptions (written or electronic).  Pharmacy of record: Pharmacy where electronic prescriptions will be sent. If written prescriptions are taken to a different pharmacy, please inform the nursing staff. The pharmacy listed in the electronic medical record should be the one where you would like electronic prescriptions to be sent.  Prescription refills: Only during scheduled appointments. Applies to both, written and electronic prescriptions.  NOTE: The following applies primarily to controlled substances (Opioid* Pain Medications).   Patient's responsibilities: 1. Pain Pills: Bring all pain pills to every appointment (except for procedure appointments). 2. Pill Bottles: Bring pills in original pharmacy bottle. Always bring newest bottle. Bring bottle, even if  empty. 3. Medication refills: You are responsible for knowing and keeping track of what medications you need refilled. The day before your appointment, write a list of all prescriptions that need to be refilled. Bring that list to your appointment and give  it to the admitting nurse. Prescriptions will be written only during appointments. If you forget a medication, it will not be "Called in", "Faxed", or "electronically sent". You will need to get another appointment to get these prescribed. 4. Prescription Accuracy: You are responsible for carefully inspecting your prescriptions before leaving our office. Have the discharge nurse carefully go over each prescription with you, before taking them home. Make sure that your name is accurately spelled, that your address is correct. Check the name and dose of your medication to make sure it is accurate. Check the number of pills, and the written instructions to make sure they are clear and accurate. Make sure that you are given enough medication to last until your next medication refill appointment. 5. Taking Medication: Take medication as prescribed. Never take more pills than instructed. Never take medication more frequently than prescribed. Taking less pills or less frequently is permitted and encouraged, when it comes to controlled substances (written prescriptions).  6. Inform other Doctors: Always inform, all of your healthcare providers, of all the medications you take. 7. Pain Medication from other Providers: You are not allowed to accept any additional pain medication from any other Doctor or Healthcare provider. There are two exceptions to this rule. (see below) In the event that you require additional pain medication, you are responsible for notifying us, as stated below. 8. Medication Agreement: You are responsible for carefully reading and following our Medication Agreement. This must be signed before receiving any prescriptions from our practice.  Safely store a copy of your signed Agreement. Violations to the Agreement will result in no further prescriptions. (Additional copies of our Medication Agreement are available upon request.) 9. Laws, Rules, & Regulations: All patients are expected to follow all [address] and [employer], Rules, [employer]. Ignorance of the Laws does not constitute a valid excuse. The use of any illegal substances is prohibited. 10. Adopted CDC guidelines & recommendations: Target dosing levels will be at or below 60 MME/day. Use of benzodiazepines** is not recommended.  Exceptions: There are only two exceptions to the rule of not receiving pain medications from other Healthcare Providers. 1. Exception #1 (Emergencies): In the event of an emergency (i.e.: accident requiring emergency care), you are allowed to receive additional pain medication. However, you are responsible for: As soon as you are able, call our office [phone], at any time of the day or night, and leave a message stating your name, the date and nature of the emergency, and the name and dose of the medication prescribed. In the event that your call is answered by a member of our staff, make sure to document and save the date, time, and the name of the person that took your information.  2. Exception #2 (Planned Surgery): In the event that you are scheduled by another doctor or dentist to have any type of surgery or procedure, you are allowed (for a period no longer than 30 days), to receive additional pain medication, for the acute post-op pain. However, in this case, you are responsible for picking up a copy of our "Post-op Pain Management for Surgeons" handout, and giving it to your surgeon or dentist. This document is available at our office, and does not require an appointment to obtain it. Simply go to our office during business hours (Monday-Thursday from 8:00 AM to 4:00 PM) (Friday 8:00 AM to 12:00 Noon) or if you have a scheduled  appointment with [place], prior to your surgery, and ask for it  by name. In addition, you will need to provide [place] with your name, name of your surgeon, type of surgery, and date of procedure or surgery.  *Opioid medications include: morphine, codeine, oxycodone, oxymorphone, hydrocodone, hydromorphone, meperidine, tramadol, tapentadol, buprenorphine, fentanyl, methadone. **Benzodiazepine medications include: diazepam (Valium), alprazolam (Xanax), clonazepam (Klonopine), lorazepam (Ativan), clorazepate (Tranxene), chlordiazepoxide (Librium), estazolam (Prosom), oxazepam (Serax), temazepam (Restoril), triazolam (Halcion) (Last updated: 09/02/2017) ____________________________________________________________________________________________   ____________________________________________________________________________________________  Medication Recommendations and Reminders  Applies to: All patients receiving prescriptions (written and/or electronic).  Medication Rules & Regulations: These rules and regulations exist for your safety and that of others. They are not flexible and neither are we. Dismissing or ignoring them will be considered "non-compliance" with medication therapy, resulting in complete and irreversible termination of such therapy. (See document titled "Medication Rules" for more details.) In all conscience, because of safety reasons, we cannot continue providing a therapy where the patient does not follow instructions.  Pharmacy of record:   Definition: This is the pharmacy where your electronic prescriptions will be sent.   We do not endorse any particular pharmacy.  You are not restricted in your choice of pharmacy.  The pharmacy listed in the electronic medical record should be the one where you want electronic prescriptions to be sent.  If you choose to change pharmacy, simply notify our nursing staff of your choice of new pharmacy.  Recommendations:  Keep all of your pain  medications in a safe place, under lock and key, even if you live alone.   After you fill your prescription, take 1 week's worth of pills and put them away in a safe place. You should keep a separate, properly labeled bottle for this purpose. The remainder should be kept in the original bottle. Use this as your primary supply, until it runs out. Once it's gone, then you know that you have 1 week's worth of medicine, and it is time to come in for a prescription refill. If you do this correctly, it is unlikely that you will ever run out of medicine.  To make sure that the above recommendation works, it is very important that you make sure your medication refill appointments are scheduled at least 1 week before you run out of medicine. To do this in an effective manner, make sure that you do not leave the office without scheduling your next medication management appointment. Always ask the nursing staff to show you in your prescription , when your medication will be running out. Then arrange for the receptionist to get you a return appointment, at least 7 days before you run out of medicine. Do not wait until you have 1 or 2 pills left, to come in. This is very poor planning and does not take into consideration that we may need to cancel appointments due to bad weather, sickness, or emergencies affecting our staff.  "Partial Fill": If for any reason your pharmacy does not have enough pills/tablets to completely fill or refill your prescription, do not allow for a "partial fill". You will need a separate prescription to fill the remaining amount, which we will not provide. If the reason for the partial fill is your insurance, you will need to talk to the pharmacist about payment alternatives for the remaining tablets, but again, do not accept a partial fill.  Prescription refills and/or changes in medication(s):   Prescription refills, and/or changes in dose or medication, will be conducted only during  scheduled medication management appointments. (Applies to both, written and electronic prescriptions.)  No refills on procedure days. No medication will be changed or started on procedure days. No changes, adjustments, and/or refills will be conducted on a procedure day. Doing so will interfere with the diagnostic portion of the procedure.  No phone refills. No medications will be "called into the pharmacy".  No Fax refills.  No weekend refills.  No Holliday refills.  No after hours refills.  Remember:  Business hours are:  Monday to Thursday 8:00 AM to 4:00 PM Provider's Schedule: Thad Ranger, NP - Appointments are:  Medication management: Monday to Thursday 8:00 AM to 4:00 PM Delano Metz, MD - Appointments are:  Medication management: Monday and Wednesday 8:00 AM to 4:00 PM Procedure day: Tuesday and Thursday 7:30 AM to 4:00 PM Edward Jolly, MD - Appointments are:  Medication management: Tuesday and Thursday 8:00 AM to 4:00 PM Procedure day: Monday and Wednesday 7:30 AM to 4:00 PM (Last update: 09/02/2017) ____________________________________________________________________________________________   ____________________________________________________________________________________________  CANNABIDIOL (AKA: CBD Oil or Pills)  Applies to: All patients receiving prescriptions of controlled substances (written and/or electronic).  General Information: Cannabidiol (CBD) was discovered in [birth year]. It is one of some 113 identified cannabinoids in cannabis (Marijuana) plants, accounting for up to 40% of the plant's extract. As of 2018, preliminary clinical research on cannabidiol included studies of anxiety, cognition, movement disorders, and pain.  Cannabidiol is consummed in multiple ways, including inhalation of cannabis smoke or vapor, as an aerosol spray into the cheek, and by mouth. It may be supplied as CBD oil containing CBD as the active ingredient (no added  tetrahydrocannabinol (THC) or terpenes), a full-plant CBD-dominant hemp extract oil, capsules, dried cannabis, or as a liquid solution. CBD is thought not have the same psychoactivity as THC, and may affect the actions of THC. Studies suggest that CBD may interact with different biological targets, including cannabinoid receptors and other neurotransmitter receptors. As of 2018 the mechanism of action for its biological effects has not been determined.  In the [place], cannabidiol has a limited approval by the Food and Drug Administration (FDA) for treatment of only two types of epilepsy disorders. The side effects of long-term use of the drug include somnolence, decreased appetite, diarrhea, fatigue, malaise, weakness, sleeping problems, and others.  CBD remains a Schedule I drug prohibited for any use.  Legality: Some manufacturers ship CBD products nationally, an illegal action which the FDA has not enforced in 2018, with CBD remaining the subject of an FDA investigational new drug evaluation, and is not considered legal as a dietary supplement or food ingredient as of December 2018. Federal illegality has made it difficult historically to conduct research on CBD. CBD is openly sold in head shops and health food stores in some states where such sales have not been explicitly legalized.  Warning: Because it is not FDA approved for general use or treatment of pain, it is not required to undergo the same manufacturing controls as prescription drugs.  This means that the available cannabidiol (CBD) may be contaminated with THC.  If this is the case, it will trigger a positive urine drug screen (UDS) test for cannabinoids (Marijuana).  Because a positive UDS for illicit substances is a violation of our medication agreement, your opioid analgesics (pain medicine) may be permanently discontinued. (Last update:  09/23/2017) ____________________________________________________________________________________________

## 2018-04-27 NOTE — Progress Notes (Signed)
Nursing Pain Medication Assessment:  Safety precautions to be maintained throughout the outpatient stay will include: orient to surroundings, keep bed in low position, maintain call bell within reach at all times, provide assistance with transfer out of bed and ambulation.  Medication Inspection Compliance: Pill count conducted under aseptic conditions, in front of the patient. Neither the pills nor the bottle was removed from the patient's sight at any time. Once count was completed pills were immediately returned to the patient in their original bottle.  Medication: Oxycodone IR Pill/Patch Count: 0 of 100 pills remain Pill/Patch Appearance: Markings consistent with prescribed medication Bottle Appearance: Standard pharmacy container. Clearly labeled. Filled Date: 09 / 27 / 2019 Last Medication intake:  Yesterday

## 2018-05-05 ENCOUNTER — Ambulatory Visit: Payer: BLUE CROSS/BLUE SHIELD | Attending: Pain Medicine

## 2018-05-17 ENCOUNTER — Encounter: Payer: BLUE CROSS/BLUE SHIELD | Admitting: Nurse Practitioner

## 2018-05-24 NOTE — Progress Notes (Addendum)
Patient's Name: Derek [name]  MRN: [id number]  Referring Provider: Jodi Marble, MD  DOB: [birth year]  PCP: Jodi Marble, MD  DOS: 05/25/2018  Note by: Gaspar Cola, MD  Service setting: Ambulatory outpatient  Specialty: Interventional Pain Management  Location: [facility] (AMB) Pain Management Facility    Patient type: Established   Primary Reason(s) for Visit: Encounter for prescription drug management. (Level of risk: moderate)  CC: No chief complaint on file.  HPI  Derek [name] is a 37 y.o. year old, male patient, who comes today for a medication management evaluation. He has Lumbar pseudoarthrosis (L5-S1); Tobacco use; Chronic low back pain (Primary Area of Pain) (Bilateral) (L>R) w/ sciatica (Bilateral); Chronic lower extremity pain (Secondary Area of Pain) (Bilateral) (L>R); Chronic pain syndrome; Long term current use of opiate analgesic; Pharmacologic therapy; Disorder of skeletal system; Problems influencing health status; Elevated C-reactive protein (CRP); Vitamin D insufficiency; Failed back surgical syndrome (x3); L5-S1 pseudoarthrosis; Chronic musculoskeletal pain; Spasm of back muscles; Sacroiliac joint dysfunction (Bilateral); Chronic sacroiliac joint pain (Bilateral); Somatic dysfunction of sacroiliac joints (Bilateral); Chronic hip pain (Bilateral); Epidural fibrosis; Lumbar postlaminectomy syndrome; DDD (degenerative disc disease), lumbosacral; Lumbar facet syndrome (Bilateral); Other specified dorsopathies, sacral and sacrococcygeal region; Spondylosis without myelopathy or radiculopathy, lumbosacral region; and Neurogenic pain on their problem list. His primarily concern today is the No chief complaint on file.  Pain Assessment: Location: Left Hip Radiating: Denies  Onset: More than a month ago Duration: Chronic pain Quality: Aching, Constant, Dull Severity: 1 /10 (subjective, self-reported pain score)  Note: Reported level is compatible with observation.                          When using our objective Pain Scale, levels between 6 and 10/10 are said to belong in an emergency room, as it progressively worsens from a 6/10, described as severely limiting, requiring emergency care not usually available at an outpatient pain management facility. At a 6/10 level, communication becomes difficult and requires great effort. Assistance to reach the emergency department may be required. Facial flushing and profuse sweating along with potentially dangerous increases in heart rate and blood pressure will be evident. Effect on ADL: "Makes some things more difficult and have to take more breaks"  Timing: Constant Modifying factors: Medications, heat, ice, procedure  BP: 134/79  HR: 94  Derek [name] was last scheduled for an appointment on 04/27/2018 for medication management. During today's appointment we reviewed Derek [name] chronic pain status, as well as his outpatient medication regimen.  The patient has done rather well with the Racz procedure and his recent change in medication regimen.  He is compliant and we will continue therapy as is.  He is thankful that our treatment has allowed him to be more active and he has resumed his work duties.  The patient  reports that he does not use drugs. His body mass index is 25.83 kg/m.  Further details on both, my assessment(s), as well as the proposed treatment plan, please see below.  Controlled Substance Pharmacotherapy Assessment REMS (Risk Evaluation and Mitigation Strategy)  Analgesic: Oxycodone IR 10 mg oxycodone q6hrs (40 mg/day) MME/day:47m/day RJanne Napoleon RN  05/25/2018  8:13 AM  Sign at close encounter Nursing Pain Medication Assessment:  Safety precautions to be maintained throughout the outpatient stay will include: orient to surroundings, keep bed in low position, maintain call bell within reach at all times, provide assistance with transfer out of bed and  ambulation.   Medication Inspection Compliance:  Pill count conducted under aseptic conditions, in front of the patient. Neither the pills nor the bottle was removed from the patient's sight at any time. Once count was completed pills were immediately returned to the patient in their original bottle.  Medication: Oxycodone HCL Pill/Patch Count: 14 of 120 pills remain Pill/Patch Appearance: Markings consistent with prescribed medication Bottle Appearance: Standard pharmacy container. Clearly labeled. Filled Date: 10 / 23 / 2019 Last Medication intake:  Today   Pharmacokinetics: Liberation and absorption (onset of action): WNL Distribution (time to peak effect): WNL Metabolism and excretion (duration of action): WNL         Pharmacodynamics: Desired effects: Analgesia: Derek [name] reports >50% benefit. Functional ability: Patient reports that medication allows him to accomplish basic ADLs Clinically meaningful improvement in function (CMIF): Sustained CMIF goals met Perceived effectiveness: Described as relatively effective, allowing for increase in activities of daily living (ADL) Undesirable effects: Side-effects or Adverse reactions: None reported Monitoring: [place] PMP: Online review of the past 12-monthperiod conducted. Compliant with practice rules and regulations Last UDS on record: Summary  Date Value Ref Range Status  04/21/2018 FINAL  Final    Comment:    ==================================================================== TOXASSURE SELECT 13 (MW) ==================================================================== Test                             Result       Flag       Units Drug Present and Declared for Prescription Verification   Oxycodone                      1156         EXPECTED   ng/mg creat   Oxymorphone                    3549         EXPECTED   ng/mg creat   Noroxycodone                   1392         EXPECTED   ng/mg creat   Noroxymorphone                 352          EXPECTED   ng/mg creat    Sources of  oxycodone are scheduled prescription medications.    Oxymorphone, noroxycodone, and noroxymorphone are expected    metabolites of oxycodone. Oxymorphone is also available as a    scheduled prescription medication. Drug Absent but Declared for Prescription Verification   Hydrocodone                    Not Detected UNEXPECTED ng/mg creat ==================================================================== Test                      Result    Flag   Units      Ref Range   Creatinine              154              mg/dL      >=20 ==================================================================== Declared Medications:  The flagging and interpretation on this report are based on the  following declared medications.  Unexpected results may arise from  inaccuracies in the declared medications.  **Note: The testing scope of this panel includes these medications:  Hydrocodone (Vicodin)  Oxycodone (Percocet)  **Note: The testing scope of this panel does not include following  reported medications:  Acetaminophen (Percocet)  Acetaminophen (Vicodin)  Calcium carbonate (Calcium Carb/Vitamin D)  Cyclobenzaprine (Flexeril)  Magnesium (Mag-Ox)  Tizanidine  Vitamin D (Calcium Carb/Vitamin D)  Vitamin D3 ==================================================================== For clinical consultation, please call [phone]. ====================================================================    UDS interpretation: Compliant          Medication Assessment Form: Reviewed. Patient indicates being compliant with therapy Treatment compliance: Compliant Risk Assessment Profile: Aberrant behavior: See prior evaluations. None observed or detected today Comorbid factors increasing risk of overdose: See prior notes. No additional risks detected today Opioid risk tool (ORT) (Total Score): 1 Personal History of Substance Abuse (SUD-Substance use disorder):  Alcohol: Negative  Illegal Drugs: Negative  Rx  Drugs: Negative  ORT Risk Level calculation: Low Risk Risk of substance use disorder (SUD): Low Opioid Risk Tool - 05/25/18 0812      Family History of Substance Abuse   Alcohol  Negative    Illegal Drugs  Negative    Rx Drugs  Negative      Personal History of Substance Abuse   Alcohol  Negative    Illegal Drugs  Negative    Rx Drugs  Negative      Age   Age between 8-45 years   Yes      History of Preadolescent Sexual Abuse   History of Preadolescent Sexual Abuse  Negative or Male      Psychological Disease   Psychological Disease  Negative    Depression  Negative      Total Score   Opioid Risk Tool Scoring  1    Opioid Risk Interpretation  Low Risk      ORT Scoring interpretation table:  Score <3 = Low Risk for SUD  Score between 4-7 = Moderate Risk for SUD  Score >8 = High Risk for Opioid Abuse   Risk Mitigation Strategies:  Patient Counseling: Covered Patient-Prescriber Agreement (PPA): Present and active  Notification to other healthcare providers: Done  Pharmacologic Plan: No change in therapy, at this time.             Laboratory Chemistry  Inflammation Markers (CRP: Acute Phase) (ESR: Chronic Phase) Lab Results  Component Value Date   CRP 13 (H) 01/24/2018   ESRSEDRATE 8 01/24/2018                         Rheumatology Markers No results found.  Renal Function Markers Lab Results  Component Value Date   BUN 8 01/24/2018   CREATININE 1.02 01/24/2018   BCR 8 (L) 01/24/2018   GFRAA 107 01/24/2018   GFRNONAA 93 01/24/2018                             Hepatic Function Markers Lab Results  Component Value Date   AST 22 01/24/2018   ALBUMIN 4.4 01/24/2018   ALKPHOS 101 01/24/2018                        Electrolytes Lab Results  Component Value Date   NA 144 01/24/2018   K 4.1 01/24/2018   CL 104 01/24/2018   CALCIUM 9.9 01/24/2018   MG 2.1 01/24/2018                        Neuropathy  Markers Lab Results  Component Value Date    HBZJIRCV89 381 01/24/2018                        CNS Tests No results found.  Bone Pathology Markers Lab Results  Component Value Date   25OHVITD1 26 (L) 01/24/2018   25OHVITD2 <1.0 01/24/2018   25OHVITD3 26 01/24/2018                         Coagulation Parameters Lab Results  Component Value Date   PLT 248 02/21/2015                        Cardiovascular Markers Lab Results  Component Value Date   HGB 13.1 02/21/2015   HCT 39.9 02/21/2015                         CA Markers No results found.  Note: Lab results reviewed.  Imaging Review  Lumbosacral Imaging: Lumbar CT wo contrast:  Results for orders placed during the hospital encounter of 02/20/15  CT Lumbar Spine Wo Contrast   Narrative CLINICAL DATA:  Brain lab protocol. Preop spine to sacrum fusion. History of back pain with radicular symptoms in both legs.  EXAM: CT LUMBAR SPINE WITHOUT CONTRAST  TECHNIQUE: Multidetector CT imaging of the lumbar spine was performed without intravenous contrast administration. Multiplanar CT image reconstructions were also generated.  COMPARISON:  None.  FINDINGS: 1 mm contiguous images were obtained from L2 through the coccyx with patient prone in preparation for brain lab localization for surgical fusion. The patient has undergone previous L4-S1 posterior and interbody fusion. L4-5 fusion is solid. There is a pseudarthrosis at L5-S1. No solid interbody or posterior arthrodesis. BILATERAL S1 screw loosening. Hardware appropriately placed. No worrisome paravertebral findings. Unremarkable sacroiliac joints. Grossly unremarkable visualized abdominal soft tissues.  IMPRESSION: Brain lab protocol in preparation for fusion revision. Solid L4-5 fusion. L5-S1 pseudarthrosis.   Electronically Signed   By: Staci Righter M.D.   On: 02/20/2015 08:26    Lumbar DG 1V:  Results for orders placed during the hospital encounter of 05/29/13  DG Lumbar Spine 1 View    Narrative CLINICAL DATA:  L4-L5 and L5-S1 lumbar surgery  EXAM: LUMBAR SPINE - 1 VIEW  COMPARISON:  MRI lumbar spine 11/06/2008  FINDINGS: Prior MRI labeled with 5 lumbar vertebrae.  Cross-table lateral view #1 at 0806 hr interpreted at 0175 hr.  Metallic probe via posterior approach projects dorsal to the L5-S1 disc space level.  Tissue spreaders and a surgical sponge are identified dorsal to L4 and L5.  IMPRESSION: Dorsal localization of the L5-S1 disc space level.   Electronically Signed   By: Lavonia Dana M.D.   On: 05/29/2013 11:57    Lumbar DG 2-3 views:  Results for orders placed during the hospital encounter of 04/19/15  DG Lumbar Spine 2-3 Views   Narrative CLINICAL DATA:  Persistent lumbago for 1 day.  Prior spinal fusion.  EXAM: LUMBAR SPINE - 2-3 VIEW  COMPARISON:  CT lumbar spine February 20, 2015  FINDINGS: Frontal, lateral, and spot lumbosacral lateral images were obtained. There is pedicle screw and plate fixation at L4, L5, and S1. There are also screws transfixing each sacroiliac joint. The screw and plate fixation devices appear intact as do disc spacers at L4-5 and L5-S1. The positioning of the pedicle screws appears unchanged compared  to prior CT examination.  There is no fracture or spondylolisthesis. Disc spaces appear unchanged ; the disc spaces superior to the postoperative areas appear unremarkable. No erosive change or bony destruction.  IMPRESSION: Postoperative change at multiple levels, stable. Note that there is now screw fixation to each sacroiliac joint. No fracture or spondylolisthesis. No erosive change or bony destruction apparent.   Electronically Signed   By: Lowella Grip III M.D.   On: 04/19/2015 14:51    Note: Imaging results reviewed.         Meds   Current Outpatient Medications:  .  Calcium Carbonate-Vit D-Min (GNP CALCIUM 1200) 1200-1000 MG-UNIT CHEW, Chew 1,200 mg by mouth daily with breakfast. Take in  combination with vitamin D and magnesium., Disp: 30 tablet, Rfl: 5 .  Cholecalciferol (VITAMIN D3) 5000 units CAPS, Take 1 capsule (5,000 Units total) by mouth daily with breakfast. Take along with calcium and magnesium., Disp: 30 capsule, Rfl: 5 .  cyclobenzaprine (FLEXERIL) 10 MG tablet, Take 1 tablet (10 mg total) by mouth at bedtime., Disp: 30 tablet, Rfl: 2 .  Magnesium 500 MG CAPS, Take 1 capsule (500 mg total) by mouth 2 (two) times daily at 8 am and 10 pm., Disp: 60 capsule, Rfl: 5 .  tiZANidine (ZANAFLEX) 4 MG tablet, Take 1 tablet (4 mg total) by mouth 2 (two) times daily as needed for muscle spasms (For muscle pain/spasm during the day.)., Disp: 60 tablet, Rfl: 2 .  [START ON 05/27/2018] Oxycodone HCl 10 MG TABS, Take 1 tablet (10 mg total) by mouth every 6 (six) hours as needed (Take only if needed, not around-the-clock.). Must last 30 days., Disp: 120 tablet, Rfl: 0  ROS  Constitutional: Denies any fever or chills Gastrointestinal: No reported hemesis, hematochezia, vomiting, or acute GI distress Musculoskeletal: Denies any acute onset joint swelling, redness, loss of ROM, or weakness Neurological: No reported episodes of acute onset apraxia, aphasia, dysarthria, agnosia, amnesia, paralysis, loss of coordination, or loss of consciousness  Allergies  Derek [name] is allergic to amoxicillin and vicodin [hydrocodone-acetaminophen].  [place]  Drug: Derek [name]  reports that he does not use drugs. Alcohol:  reports that he drinks alcohol. Tobacco:  reports that he has been smoking. He has a 16.00 pack-year smoking history. He has never used smokeless tobacco. Medical:  has a past medical history of Anxiety, Arthritis, and GERD (gastroesophageal reflux disease). Surgical: Derek [name]  has a past surgical history that includes Back surgery; HAND REIMPLANTED; and Lumbar fusion (11/14). Family: family history is not on file.  Constitutional Exam  General appearance: Well nourished, well developed,  and well hydrated. In no apparent acute distress Vitals:   05/25/18 0808 05/25/18 0809  BP:  134/79  Pulse:  94  Resp:  16  Temp:  97.7 F (36.5 C)  SpO2:  98%  Weight: 180 lb (81.6 kg)   Height: 5' 10" (1.778 m)    BMI Assessment: Estimated body mass index is 25.83 kg/m as calculated from the following:   Height as of this encounter: 5' 10" (1.778 m).   Weight as of this encounter: 180 lb (81.6 kg).  BMI interpretation table: BMI level Category Range association with higher incidence of chronic pain  <18 kg/m2 Underweight   18.5-24.9 kg/m2 Ideal body weight   25-29.9 kg/m2 Overweight Increased incidence by 20%  30-34.9 kg/m2 Obese (Class I) Increased incidence by 68%  35-39.9 kg/m2 Severe obesity (Class II) Increased incidence by 136%  >40 kg/m2 Extreme obesity (Class III)  Increased incidence by 254%   Patient's current BMI Ideal Body weight  Body mass index is 25.83 kg/m. Ideal body weight: 73 kg (160 lb 15 oz) Adjusted ideal body weight: 76.5 kg (168 lb 9 oz)   BMI Readings from Last 4 Encounters:  05/25/18 25.83 kg/m  04/27/18 25.83 kg/m  04/21/18 25.83 kg/m  04/07/18 25.83 kg/m   Wt Readings from Last 4 Encounters:  05/25/18 180 lb (81.6 kg)  04/27/18 180 lb (81.6 kg)  04/21/18 180 lb (81.6 kg)  04/07/18 180 lb (81.6 kg)  Psych/Mental status: Alert, oriented x 3 (person, place, & time)       Eyes: PERLA Respiratory: No evidence of acute respiratory distress  Cervical Spine Area Exam  Skin & Axial Inspection: No masses, redness, edema, swelling, or associated skin lesions Alignment: Symmetrical Functional ROM: Unrestricted ROM      Stability: No instability detected Muscle Tone/Strength: Functionally intact. No obvious neuro-muscular anomalies detected. Sensory (Neurological): Unimpaired Palpation: No palpable anomalies              Upper Extremity (UE) Exam    Side: Right upper extremity  Side: Left upper extremity  Skin & Extremity Inspection: Skin  color, temperature, and hair growth are WNL. No peripheral edema or cyanosis. No masses, redness, swelling, asymmetry, or associated skin lesions. No contractures.  Skin & Extremity Inspection: Skin color, temperature, and hair growth are WNL. No peripheral edema or cyanosis. No masses, redness, swelling, asymmetry, or associated skin lesions. No contractures.  Functional ROM: Unrestricted ROM          Functional ROM: Unrestricted ROM          Muscle Tone/Strength: Functionally intact. No obvious neuro-muscular anomalies detected.  Muscle Tone/Strength: Functionally intact. No obvious neuro-muscular anomalies detected.  Sensory (Neurological): Unimpaired          Sensory (Neurological): Unimpaired          Palpation: No palpable anomalies              Palpation: No palpable anomalies              Provocative Test(s):  Phalen's test: deferred Tinel's test: deferred Apley's scratch test (touch opposite shoulder):  Action 1 (Across chest): deferred Action 2 (Overhead): deferred Action 3 (LB reach): deferred   Provocative Test(s):  Phalen's test: deferred Tinel's test: deferred Apley's scratch test (touch opposite shoulder):  Action 1 (Across chest): deferred Action 2 (Overhead): deferred Action 3 (LB reach): deferred    Thoracic Spine Area Exam  Skin & Axial Inspection: No masses, redness, or swelling Alignment: Symmetrical Functional ROM: Unrestricted ROM Stability: No instability detected Muscle Tone/Strength: Functionally intact. No obvious neuro-muscular anomalies detected. Sensory (Neurological): Unimpaired Muscle strength & Tone: No palpable anomalies  Lumbar Spine Area Exam  Skin & Axial Inspection: No masses, redness, or swelling Alignment: Symmetrical Functional ROM: Unrestricted ROM       Stability: No instability detected Muscle Tone/Strength: Functionally intact. No obvious neuro-muscular anomalies detected. Sensory (Neurological): Unimpaired Palpation: No palpable  anomalies       Provocative Tests: Hyperextension/rotation test: deferred today       Lumbar quadrant test (Kemp's test): deferred today       Lateral bending test: deferred today       Patrick's Maneuver: (+) for bilateral S-I arthralgia (L>R)       FABER test: (+) for bilateral S-I arthralgia (L>R)       S-I anterior distraction/compression test: deferred today  S-I lateral compression test: deferred today         S-I Thigh-thrust test: deferred today         S-I Gaenslen's test: deferred today          Gait & Posture Assessment  Ambulation: Unassisted Gait: Relatively normal for age and body habitus Posture: WNL   Lower Extremity Exam    Side: Right lower extremity  Side: Left lower extremity  Stability: No instability observed          Stability: No instability observed          Skin & Extremity Inspection: Skin color, temperature, and hair growth are WNL. No peripheral edema or cyanosis. No masses, redness, swelling, asymmetry, or associated skin lesions. No contractures.  Skin & Extremity Inspection: Skin color, temperature, and hair growth are WNL. No peripheral edema or cyanosis. No masses, redness, swelling, asymmetry, or associated skin lesions. No contractures.  Functional ROM: Unrestricted ROM                  Functional ROM: Unrestricted ROM                  Muscle Tone/Strength: Functionally intact. No obvious neuro-muscular anomalies detected.  Muscle Tone/Strength: Functionally intact. No obvious neuro-muscular anomalies detected.  Sensory (Neurological): Unimpaired  Sensory (Neurological): Unimpaired  DTR: Patellar: deferred today Achilles: deferred today Plantar: deferred today  DTR: Patellar: deferred today Achilles: deferred today Plantar: deferred today  Palpation: No palpable anomalies  Palpation: No palpable anomalies   Assessment  Primary Diagnosis & Pertinent Problem List: The primary encounter diagnosis was Chronic pain syndrome. Diagnoses of  Chronic low back pain (Primary Area of Pain) (Bilateral) (L>R) w/ sciatica (Bilateral), Chronic lower extremity pain (Secondary Area of Pain) (Bilateral) (L>R), Chronic sacroiliac joint pain (Bilateral), Failed back surgical syndrome (x3), Lumbar facet syndrome (Bilateral), Sacroiliac joint dysfunction (Bilateral), and Other specified dorsopathies, sacral and sacrococcygeal region were also pertinent to this visit.  Status Diagnosis  Controlled Controlled Controlled 1. Chronic pain syndrome   2. Chronic low back pain (Primary Area of Pain) (Bilateral) (L>R) w/ sciatica (Bilateral)   3. Chronic lower extremity pain (Secondary Area of Pain) (Bilateral) (L>R)   4. Chronic sacroiliac joint pain (Bilateral)   5. Failed back surgical syndrome (x3)   6. Lumbar facet syndrome (Bilateral)   7. Sacroiliac joint dysfunction (Bilateral)   8. Other specified dorsopathies, sacral and sacrococcygeal region     Problems updated and reviewed during this visit: No problems updated. Plan of Care  Pharmacotherapy (Medications Ordered): Meds ordered this encounter  Medications  . Oxycodone HCl 10 MG TABS    Sig: Take 1 tablet (10 mg total) by mouth every 6 (six) hours as needed (Take only if needed, not around-the-clock.). Must last 30 days.    Dispense:  120 tablet    Refill:  0    [place] STOP ACT - Not applicable. Fill one day early if pharmacy is closed on scheduled refill date. Do not fill until: 05/27/18. Must last 30 days. To last until: 06/26/18.   Medications administered today: [name] had no medications administered during this visit.   Procedure Orders     SACROILIAC JOINT INJECTION Lab Orders  No laboratory test(s) ordered today   Imaging Orders  No imaging studies ordered today   Referral Orders  No referral(s) requested today   Interventional management options: Planned, scheduled, and/or pending:   None at this time.  Today I have refilled his  pain medication he seems to be  doing rather well on it.  We will stay at this particular dose.  From now on he will be doing his refills with Lanell Persons, NP.  She has instructions to continue monitoring his medication use and as long as he continues exercising good judgment and they continue to work for him, will continue with the medication.  In addition as long as he continues to follow our guidelines, he may also earn that privilege of getting his medications refilled every 60 days and if again compliant, we may move on to the maximum of 90 days.   Considering:   Diagnostic bilateral lumbar facet block Possible bilateral lumbar facet RFA Diagnostic bilateral sacroiliac joint block Possible bilateral sacroiliac joint RFA Possible spinal cord stimulator trial   Palliative PRN treatment(s):   Therapeutic Racz procedure #2(no sooner than 6 months from 04/07/18) Diagnostic bilateral sacroiliac joint block #1 under fluoroscopic guidance   Provider-requested follow-up: Return in about 1 month (around 06/24/2018) for Med-Mgmt, w/ Dionisio David, NP.  Future Appointments  Date Time Provider [place]  06/20/2018  8:45 AM [place] Francois, NP [facility] None   Primary Care Physician: Jodi Marble, MD Location: [facility] Note by: Gaspar Cola, MD Date: 05/25/2018; Time: 8:57 AM

## 2018-05-25 ENCOUNTER — Encounter: Payer: Self-pay | Admitting: Pain Medicine

## 2018-05-25 ENCOUNTER — Ambulatory Visit: Payer: BLUE CROSS/BLUE SHIELD | Attending: Pain Medicine | Admitting: Pain Medicine

## 2018-05-25 VITALS — BP 134/79 | HR 94 | Temp 97.7°F | Resp 16 | Ht 70.0 in | Wt 180.0 lb

## 2018-05-25 DIAGNOSIS — M5388 Other specified dorsopathies, sacral and sacrococcygeal region: Secondary | ICD-10-CM | POA: Insufficient documentation

## 2018-05-25 DIAGNOSIS — Z981 Arthrodesis status: Secondary | ICD-10-CM | POA: Insufficient documentation

## 2018-05-25 DIAGNOSIS — M5442 Lumbago with sciatica, left side: Secondary | ICD-10-CM

## 2018-05-25 DIAGNOSIS — G9619 Other disorders of meninges, not elsewhere classified: Secondary | ICD-10-CM | POA: Diagnosis not present

## 2018-05-25 DIAGNOSIS — F329 Major depressive disorder, single episode, unspecified: Secondary | ICD-10-CM | POA: Insufficient documentation

## 2018-05-25 DIAGNOSIS — Z5181 Encounter for therapeutic drug level monitoring: Secondary | ICD-10-CM | POA: Insufficient documentation

## 2018-05-25 DIAGNOSIS — M25552 Pain in left hip: Secondary | ICD-10-CM | POA: Insufficient documentation

## 2018-05-25 DIAGNOSIS — G894 Chronic pain syndrome: Secondary | ICD-10-CM | POA: Diagnosis not present

## 2018-05-25 DIAGNOSIS — E559 Vitamin D deficiency, unspecified: Secondary | ICD-10-CM | POA: Diagnosis not present

## 2018-05-25 DIAGNOSIS — Z880 Allergy status to penicillin: Secondary | ICD-10-CM | POA: Insufficient documentation

## 2018-05-25 DIAGNOSIS — F17200 Nicotine dependence, unspecified, uncomplicated: Secondary | ICD-10-CM | POA: Insufficient documentation

## 2018-05-25 DIAGNOSIS — K219 Gastro-esophageal reflux disease without esophagitis: Secondary | ICD-10-CM | POA: Diagnosis not present

## 2018-05-25 DIAGNOSIS — F419 Anxiety disorder, unspecified: Secondary | ICD-10-CM | POA: Insufficient documentation

## 2018-05-25 DIAGNOSIS — M79604 Pain in right leg: Secondary | ICD-10-CM

## 2018-05-25 DIAGNOSIS — R7982 Elevated C-reactive protein (CRP): Secondary | ICD-10-CM | POA: Insufficient documentation

## 2018-05-25 DIAGNOSIS — M9904 Segmental and somatic dysfunction of sacral region: Secondary | ICD-10-CM | POA: Diagnosis not present

## 2018-05-25 DIAGNOSIS — Z79891 Long term (current) use of opiate analgesic: Secondary | ICD-10-CM | POA: Diagnosis not present

## 2018-05-25 DIAGNOSIS — M25551 Pain in right hip: Secondary | ICD-10-CM | POA: Insufficient documentation

## 2018-05-25 DIAGNOSIS — M79605 Pain in left leg: Secondary | ICD-10-CM

## 2018-05-25 DIAGNOSIS — M961 Postlaminectomy syndrome, not elsewhere classified: Secondary | ICD-10-CM | POA: Diagnosis not present

## 2018-05-25 DIAGNOSIS — M7918 Myalgia, other site: Secondary | ICD-10-CM | POA: Diagnosis not present

## 2018-05-25 DIAGNOSIS — Z79899 Other long term (current) drug therapy: Secondary | ICD-10-CM | POA: Diagnosis not present

## 2018-05-25 DIAGNOSIS — G8929 Other chronic pain: Secondary | ICD-10-CM

## 2018-05-25 DIAGNOSIS — M533 Sacrococcygeal disorders, not elsewhere classified: Secondary | ICD-10-CM | POA: Insufficient documentation

## 2018-05-25 DIAGNOSIS — Z885 Allergy status to narcotic agent status: Secondary | ICD-10-CM | POA: Diagnosis not present

## 2018-05-25 DIAGNOSIS — M47816 Spondylosis without myelopathy or radiculopathy, lumbar region: Secondary | ICD-10-CM

## 2018-05-25 DIAGNOSIS — M5441 Lumbago with sciatica, right side: Secondary | ICD-10-CM

## 2018-05-25 MED ORDER — OXYCODONE HCL 10 MG PO TABS: 10.0000 mg | ORAL_TABLET | Freq: Four times a day (QID) | ORAL | 0 refills | Status: DC | PRN

## 2018-05-25 NOTE — Patient Instructions (Addendum)
_You have been goven an electronic Rx for Oxycodone. Schedule procedure as needed.  ___________________________________________________________________________________________  Medication Rules  Purpose: To inform patients, and their family members, of our rules and regulations.  Applies to: All patients receiving prescriptions (written or electronic).  Pharmacy of record: Pharmacy where electronic prescriptions will be sent. If written prescriptions are taken to a different pharmacy, please inform the nursing staff. The pharmacy listed in the electronic medical record should be the one where you would like electronic prescriptions to be sent.  Electronic prescriptions: In compliance with the [facility] Strengthen Opioid Misuse Prevention (STOP) Act of 2017 (Session Conni Elliot [phone]), effective July 06, 2018, all controlled substances must be electronically prescribed. Calling prescriptions to the pharmacy will cease to exist.  Prescription refills: Only during scheduled appointments. Applies to all prescriptions.  NOTE: The following applies primarily to controlled substances (Opioid* Pain Medications).   Patient's responsibilities: 1. Pain Pills: Bring all pain pills to every appointment (except for procedure appointments). 2. Pill Bottles: Bring pills in original pharmacy bottle. Always bring the newest bottle. Bring bottle, even if empty. 3. Medication refills: You are responsible for knowing and keeping track of what medications you take and those you need refilled. The day before your appointment: write a list of all prescriptions that need to be refilled. The day of the appointment: give the list to the admitting nurse. Prescriptions will be written only during appointments. If you forget a medication: it will not be "Called in", "Faxed", or "electronically sent". You will need to get another appointment to get these prescribed. No early refills. Do not call asking to have your  prescription filled early. 4. Prescription Accuracy: You are responsible for carefully inspecting your prescriptions before leaving our office. Have the discharge nurse carefully go over each prescription with you, before taking them home. Make sure that your name is accurately spelled, that your address is correct. Check the name and dose of your medication to make sure it is accurate. Check the number of pills, and the written instructions to make sure they are clear and accurate. Make sure that you are given enough medication to last until your next medication refill appointment. 5. Taking Medication: Take medication as prescribed. When it comes to controlled substances, taking less pills or less frequently than prescribed is permitted and encouraged. Never take more pills than instructed. Never take medication more frequently than prescribed.  6. Inform other Doctors: Always inform, all of your healthcare providers, of all the medications you take. 7. Pain Medication from other Providers: You are not allowed to accept any additional pain medication from any other Doctor or Healthcare provider. There are two exceptions to this rule. (see below) In the event that you require additional pain medication, you are responsible for notifying us, as stated below. 8. Medication Agreement: You are responsible for carefully reading and following our Medication Agreement. This must be signed before receiving any prescriptions from our practice. Safely store a copy of your signed Agreement. Violations to the Agreement will result in no further prescriptions. (Additional copies of our Medication Agreement are available upon request.) 9. Laws, Rules, & Regulations: All patients are expected to follow all [address] and [employer], Rules, [place]. Ignorance of the Laws does not constitute a valid excuse. The use of any illegal substances is prohibited. 10. Adopted CDC guidelines & recommendations: Target  dosing levels will be at or below 60 MME/day. Use of benzodiazepines** is not recommended.  Exceptions: There are only two  exceptions to the rule of not receiving pain medications from other Healthcare Providers. 1. Exception #1 (Emergencies): In the event of an emergency (i.e.: accident requiring emergency care), you are allowed to receive additional pain medication. However, you are responsible for: As soon as you are able, call our office [phone], at any time of the day or night, and leave a message stating your name, the date and nature of the emergency, and the name and dose of the medication prescribed. In the event that your call is answered by a member of our staff, make sure to document and save the date, time, and the name of the person that took your information.  2. Exception #2 (Planned Surgery): In the event that you are scheduled by another doctor or dentist to have any type of surgery or procedure, you are allowed (for a period no longer than 30 days), to receive additional pain medication, for the acute post-op pain. However, in this case, you are responsible for picking up a copy of our "Post-op Pain Management for Surgeons" handout, and giving it to your surgeon or dentist. This document is available at our office, and does not require an appointment to obtain it. Simply go to our office during business hours (Monday-Thursday from 8:00 AM to 4:00 PM) (Friday 8:00 AM to 12:00 Noon) or if you have a scheduled appointment with [place], prior to your surgery, and ask for it by name. In addition, you will need to provide [place] with your name, name of your surgeon, type of surgery, and date of procedure or surgery.  *Opioid medications include: morphine, codeine, oxycodone, oxymorphone, hydrocodone, hydromorphone, meperidine, tramadol, tapentadol, buprenorphine, fentanyl, methadone. **Benzodiazepine medications include: diazepam (Valium), alprazolam (Xanax), clonazepam (Klonopine), lorazepam  (Ativan), clorazepate (Tranxene), chlordiazepoxide (Librium), estazolam (Prosom), oxazepam (Serax), temazepam (Restoril), triazolam (Halcion) (Last updated: 09/02/2017) ____________________________________________________________________________________________   ____________________________________________________________________________________________  Medication Recommendations and Reminders  Applies to: All patients receiving prescriptions (written and/or electronic).  Medication Rules & Regulations: These rules and regulations exist for your safety and that of others. They are not flexible and neither are we. Dismissing or ignoring them will be considered "non-compliance" with medication therapy, resulting in complete and irreversible termination of such therapy. (See document titled "Medication Rules" for more details.) In all conscience, because of safety reasons, we cannot continue providing a therapy where the patient does not follow instructions.  Pharmacy of record:   Definition: This is the pharmacy where your electronic prescriptions will be sent.   We do not endorse any particular pharmacy.  You are not restricted in your choice of pharmacy.  The pharmacy listed in the electronic medical record should be the one where you want electronic prescriptions to be sent.  If you choose to change pharmacy, simply notify our nursing staff of your choice of new pharmacy.  Recommendations:  Keep all of your pain medications in a safe place, under lock and key, even if you live alone.   After you fill your prescription, take 1 week's worth of pills and put them away in a safe place. You should keep a separate, properly labeled bottle for this purpose. The remainder should be kept in the original bottle. Use this as your primary supply, until it runs out. Once it's gone, then you know that you have 1 week's worth of medicine, and it is time to come in for a prescription refill. If you  do this correctly, it is unlikely that you will ever run out of medicine.  To make  sure that the above recommendation works, it is very important that you make sure your medication refill appointments are scheduled at least 1 week before you run out of medicine. To do this in an effective manner, make sure that you do not leave the office without scheduling your next medication management appointment. Always ask the nursing staff to show you in your prescription , when your medication will be running out. Then arrange for the receptionist to get you a return appointment, at least 7 days before you run out of medicine. Do not wait until you have 1 or 2 pills left, to come in. This is very poor planning and does not take into consideration that we may need to cancel appointments due to bad weather, sickness, or emergencies affecting our staff.  "Partial Fill": If for any reason your pharmacy does not have enough pills/tablets to completely fill or refill your prescription, do not allow for a "partial fill". You will need a separate prescription to fill the remaining amount, which we will not provide. If the reason for the partial fill is your insurance, you will need to talk to the pharmacist about payment alternatives for the remaining tablets, but again, do not accept a partial fill.  Prescription refills and/or changes in medication(s):   Prescription refills, and/or changes in dose or medication, will be conducted only during scheduled medication management appointments. (Applies to both, written and electronic prescriptions.)  No refills on procedure days. No medication will be changed or started on procedure days. No changes, adjustments, and/or refills will be conducted on a procedure day. Doing so will interfere with the diagnostic portion of the procedure.  No phone refills. No medications will be "called into the pharmacy".  No Fax refills.  No weekend refills.  No Holliday refills.  No  after hours refills.  Remember:  Business hours are:  Monday to Thursday 8:00 AM to 4:00 PM Provider's Schedule: Thad Rangerrystal King, NP - Appointments are:  Medication management: Monday to Thursday 8:00 AM to 4:00 PM Delano MetzFrancisco Jesly Hartmann, MD - Appointments are:  Medication management: Monday and Wednesday 8:00 AM to 4:00 PM Procedure day: Tuesday and Thursday 7:30 AM to 4:00 PM Edward JollyBilal Lateef, MD - Appointments are:  Medication management: Tuesday and Thursday 8:00 AM to 4:00 PM Procedure day: Monday and Wednesday 7:30 AM to 4:00 PM (Last update: 09/02/2017) ____________________________________________________________________________________________   ____________________________________________________________________________________________  CANNABIDIOL (AKA: CBD Oil or Pills)  Applies to: All patients receiving prescriptions of controlled substances (written and/or electronic).  General Information: Cannabidiol (CBD) was discovered in [id number]. It is one of some 113 identified cannabinoids in cannabis (Marijuana) plants, accounting for up to 40% of the plant's extract. As of 2018, preliminary clinical research on cannabidiol included studies of anxiety, cognition, movement disorders, and pain.  Cannabidiol is consummed in multiple ways, including inhalation of cannabis smoke or vapor, as an aerosol spray into the cheek, and by mouth. It may be supplied as CBD oil containing CBD as the active ingredient (no added tetrahydrocannabinol (THC) or terpenes), a full-plant CBD-dominant hemp extract oil, capsules, dried cannabis, or as a liquid solution. CBD is thought not have the same psychoactivity as THC, and may affect the actions of THC. Studies suggest that CBD may interact with different biological targets, including cannabinoid receptors and other neurotransmitter receptors. As of 2018 the mechanism of action for its biological effects has not been determined.  In the [place], cannabidiol has  a limited approval by the Education officer, environmentalood and Drug Administration (FDA)  for treatment of only two types of epilepsy disorders. The side effects of long-term use of the drug include somnolence, decreased appetite, diarrhea, fatigue, malaise, weakness, sleeping problems, and others.  CBD remains a Schedule I drug prohibited for any use.  Legality: Some manufacturers ship CBD products nationally, an illegal action which the FDA has not enforced in 2018, with CBD remaining the subject of an FDA investigational new drug evaluation, and is not considered legal as a dietary supplement or food ingredient as of December 2018. Federal illegality has made it difficult historically to conduct research on CBD. CBD is openly sold in head shops and health food stores in some states where such sales have not been explicitly legalized.  Warning: Because it is not FDA approved for general use or treatment of pain, it is not required to undergo the same manufacturing controls as prescription drugs.  This means that the available cannabidiol (CBD) may be contaminated with THC.  If this is the case, it will trigger a positive urine drug screen (UDS) test for cannabinoids (Marijuana).  Because a positive UDS for illicit substances is a violation of our medication agreement, your opioid analgesics (pain medicine) may be permanently discontinued. (Last update: 09/23/2017) ____________________________________________________________________________________________   ____________________________________________________________________________________________  Pain Prevention Technique  Definition:   A technique used to minimize the effects of an activity known to cause inflammation or swelling, which in turn leads to an increase in pain.  Purpose: To prevent swelling from occurring. It is based on the fact that it is easier to prevent swelling from happening than it is to get rid of it, once it  occurs.  Contraindications: 1. Anyone with allergy or hypersensitivity to the recommended medications. 2. Anyone taking anticoagulants (Blood Thinners) (e.g., Coumadin, Warfarin, Plavix, etc.). 3. Patients in Renal Failure.  Technique: Before you undertake an activity known to cause pain, or a flare-up of your chronic pain, and before you experience any pain, do the following:  1. On a full stomach, take 4 (four) over the counter Ibuprofens 200mg  tablets (Motrin), for a total of 800 mg. 2. In addition, take over the counter Magnesium 400 to 500 mg, before doing the activity.  3. Six (6) hours later, again on a full stomach, repeat the Ibuprofen. 4. That night, take a warm shower and stretch under the running warm water.  This technique may be sufficient to abort the pain and discomfort before it happens. Keep in mind that it takes a lot less medication to prevent swelling than it takes to eliminate it once it occurs.  ____________________________________________________________________________________________

## 2018-05-25 NOTE — Progress Notes (Signed)
Nursing Pain Medication Assessment:  Safety precautions to be maintained throughout the outpatient stay will include: orient to surroundings, keep bed in low position, maintain call bell within reach at all times, provide assistance with transfer out of bed and ambulation.   Medication Inspection Compliance: Pill count conducted under aseptic conditions, in front of the patient. Neither the pills nor the bottle was removed from the patient's sight at any time. Once count was completed pills were immediately returned to the patient in their original bottle.  Medication: Oxycodone HCL Pill/Patch Count: 14 of 120 pills remain Pill/Patch Appearance: Markings consistent with prescribed medication Bottle Appearance: Standard pharmacy container. Clearly labeled. Filled Date: 10 / 23 / 2019 Last Medication intake:  Today

## 2018-06-20 ENCOUNTER — Other Ambulatory Visit: Payer: Self-pay

## 2018-06-20 ENCOUNTER — Ambulatory Visit: Payer: BLUE CROSS/BLUE SHIELD | Attending: Nurse Practitioner | Admitting: Nurse Practitioner

## 2018-06-20 ENCOUNTER — Encounter: Payer: Self-pay | Admitting: Nurse Practitioner

## 2018-06-20 VITALS — BP 134/90 | HR 108 | Temp 98.0°F | Ht 70.0 in | Wt 181.0 lb

## 2018-06-20 DIAGNOSIS — Z880 Allergy status to penicillin: Secondary | ICD-10-CM | POA: Insufficient documentation

## 2018-06-20 DIAGNOSIS — M533 Sacrococcygeal disorders, not elsewhere classified: Secondary | ICD-10-CM | POA: Diagnosis not present

## 2018-06-20 DIAGNOSIS — Z885 Allergy status to narcotic agent status: Secondary | ICD-10-CM | POA: Insufficient documentation

## 2018-06-20 DIAGNOSIS — M79604 Pain in right leg: Secondary | ICD-10-CM | POA: Insufficient documentation

## 2018-06-20 DIAGNOSIS — M7918 Myalgia, other site: Secondary | ICD-10-CM

## 2018-06-20 DIAGNOSIS — M9904 Segmental and somatic dysfunction of sacral region: Secondary | ICD-10-CM | POA: Insufficient documentation

## 2018-06-20 DIAGNOSIS — Z5181 Encounter for therapeutic drug level monitoring: Secondary | ICD-10-CM | POA: Insufficient documentation

## 2018-06-20 DIAGNOSIS — F419 Anxiety disorder, unspecified: Secondary | ICD-10-CM | POA: Diagnosis not present

## 2018-06-20 DIAGNOSIS — G9619 Other disorders of meninges, not elsewhere classified: Secondary | ICD-10-CM | POA: Insufficient documentation

## 2018-06-20 DIAGNOSIS — G8929 Other chronic pain: Secondary | ICD-10-CM

## 2018-06-20 DIAGNOSIS — M25551 Pain in right hip: Secondary | ICD-10-CM | POA: Insufficient documentation

## 2018-06-20 DIAGNOSIS — M5442 Lumbago with sciatica, left side: Secondary | ICD-10-CM | POA: Diagnosis not present

## 2018-06-20 DIAGNOSIS — M961 Postlaminectomy syndrome, not elsewhere classified: Secondary | ICD-10-CM | POA: Insufficient documentation

## 2018-06-20 DIAGNOSIS — Z79891 Long term (current) use of opiate analgesic: Secondary | ICD-10-CM | POA: Insufficient documentation

## 2018-06-20 DIAGNOSIS — M5441 Lumbago with sciatica, right side: Secondary | ICD-10-CM | POA: Insufficient documentation

## 2018-06-20 DIAGNOSIS — G894 Chronic pain syndrome: Secondary | ICD-10-CM | POA: Diagnosis not present

## 2018-06-20 DIAGNOSIS — M47817 Spondylosis without myelopathy or radiculopathy, lumbosacral region: Secondary | ICD-10-CM | POA: Diagnosis not present

## 2018-06-20 DIAGNOSIS — M62830 Muscle spasm of back: Secondary | ICD-10-CM | POA: Diagnosis not present

## 2018-06-20 DIAGNOSIS — R7982 Elevated C-reactive protein (CRP): Secondary | ICD-10-CM | POA: Insufficient documentation

## 2018-06-20 DIAGNOSIS — E559 Vitamin D deficiency, unspecified: Secondary | ICD-10-CM | POA: Diagnosis not present

## 2018-06-20 DIAGNOSIS — F17200 Nicotine dependence, unspecified, uncomplicated: Secondary | ICD-10-CM | POA: Insufficient documentation

## 2018-06-20 DIAGNOSIS — F329 Major depressive disorder, single episode, unspecified: Secondary | ICD-10-CM | POA: Diagnosis not present

## 2018-06-20 DIAGNOSIS — K219 Gastro-esophageal reflux disease without esophagitis: Secondary | ICD-10-CM | POA: Diagnosis not present

## 2018-06-20 DIAGNOSIS — Z79899 Other long term (current) drug therapy: Secondary | ICD-10-CM | POA: Insufficient documentation

## 2018-06-20 DIAGNOSIS — M79605 Pain in left leg: Secondary | ICD-10-CM | POA: Diagnosis not present

## 2018-06-20 DIAGNOSIS — M25552 Pain in left hip: Secondary | ICD-10-CM | POA: Insufficient documentation

## 2018-06-20 MED ORDER — OXYCODONE HCL 10 MG PO TABS: 10.0000 mg | ORAL_TABLET | Freq: Four times a day (QID) | ORAL | 0 refills | Status: DC | PRN

## 2018-06-20 NOTE — Progress Notes (Signed)
Nursing Pain Medication Assessment:  Safety precautions to be maintained throughout the outpatient stay will include: orient to surroundings, keep bed in low position, maintain call bell within reach at all times, provide assistance with transfer out of bed and ambulation.  Medication Inspection Compliance: Pill count conducted under aseptic conditions, in front of the patient. Neither the pills nor the bottle was removed from the patient's sight at any time. Once count was completed pills were immediately returned to the patient in their original bottle.  Medication: Oxycodone IR Pill/Patch Count: 23 of 120 pills remain Pill/Patch Appearance: Markings consistent with prescribed medication Bottle Appearance: Standard pharmacy container. Clearly labeled. Filled Date: 2111 / 22 / 2019 Last Medication intake:  Today

## 2018-06-20 NOTE — Patient Instructions (Signed)
____________________________________________________________________________________________  Medication Rules  Purpose: To inform patients, and their family members, of our rules and regulations.  Applies to: All patients receiving prescriptions (written or electronic).  Pharmacy of record: Pharmacy where electronic prescriptions will be sent. If written prescriptions are taken to a different pharmacy, please inform the nursing staff. The pharmacy listed in the electronic medical record should be the one where you would like electronic prescriptions to be sent.  Electronic prescriptions: In compliance with the [place] Strengthen Opioid Misuse Prevention (STOP) Act of 2017 (Session Law 2017-74/H243), effective July 06, 2018, all controlled substances must be electronically prescribed. Calling prescriptions to the pharmacy will cease to exist.  Prescription refills: Only during scheduled appointments. Applies to all prescriptions.  NOTE: The following applies primarily to controlled substances (Opioid* Pain Medications).   Patient's responsibilities: 1. Pain Pills: Bring all pain pills to every appointment (except for procedure appointments). 2. Pill Bottles: Bring pills in original pharmacy bottle. Always bring the newest bottle. Bring bottle, even if empty. 3. Medication refills: You are responsible for knowing and keeping track of what medications you take and those you need refilled. The day before your appointment: write a list of all prescriptions that need to be refilled. The day of the appointment: give the list to the admitting nurse. Prescriptions will be written only during appointments. If you forget a medication: it will not be "Called in", "Faxed", or "electronically sent". You will need to get another appointment to get these prescribed. No early refills. Do not call asking to have your prescription filled early. 4. Prescription Accuracy: You are responsible for  carefully inspecting your prescriptions before leaving our office. Have the discharge nurse carefully go over each prescription with you, before taking them home. Make sure that your name is accurately spelled, that your address is correct. Check the name and dose of your medication to make sure it is accurate. Check the number of pills, and the written instructions to make sure they are clear and accurate. Make sure that you are given enough medication to last until your next medication refill appointment. 5. Taking Medication: Take medication as prescribed. When it comes to controlled substances, taking less pills or less frequently than prescribed is permitted and encouraged. Never take more pills than instructed. Never take medication more frequently than prescribed.  6. Inform other Doctors: Always inform, all of your healthcare providers, of all the medications you take. 7. Pain Medication from other Providers: You are not allowed to accept any additional pain medication from any other Doctor or Healthcare provider. There are two exceptions to this rule. (see below) In the event that you require additional pain medication, you are responsible for notifying us, as stated below. 8. Medication Agreement: You are responsible for carefully reading and following our Medication Agreement. This must be signed before receiving any prescriptions from our practice. Safely store a copy of your signed Agreement. Violations to the Agreement will result in no further prescriptions. (Additional copies of our Medication Agreement are available upon request.) 9. Laws, Rules, & Regulations: All patients are expected to follow all Federal and State Laws, Statutes, Rules, & Regulations. Ignorance of the Laws does not constitute a valid excuse. The use of any illegal substances is prohibited. 10. Adopted CDC guidelines & recommendations: Target dosing levels will be at or below 60 MME/day. Use of benzodiazepines** is not  recommended.  Exceptions: There are only two exceptions to the rule of not receiving pain medications from other Healthcare Providers. 1.   Exception #1 (Emergencies): In the event of an emergency (i.e.: accident requiring emergency care), you are allowed to receive additional pain medication. However, you are responsible for: As soon as you are able, call our office [phone], at any time of the day or night, and leave a message stating your name, the date and nature of the emergency, and the name and dose of the medication prescribed. In the event that your call is answered by a member of our staff, make sure to document and save the date, time, and the name of the person that took your information.  2. Exception #2 (Planned Surgery): In the event that you are scheduled by another doctor or dentist to have any type of surgery or procedure, you are allowed (for a period no longer than 30 days), to receive additional pain medication, for the acute post-op pain. However, in this case, you are responsible for picking up a copy of our "Post-op Pain Management for Surgeons" handout, and giving it to your surgeon or dentist. This document is available at our office, and does not require an appointment to obtain it. Simply go to our office during business hours (Monday-Thursday from 8:00 AM to 4:00 PM) (Friday 8:00 AM to 12:00 Noon) or if you have a scheduled appointment with us, prior to your surgery, and ask for it by name. In addition, you will need to provide us with your name, name of your surgeon, type of surgery, and date of procedure or surgery.  *Opioid medications include: morphine, codeine, oxycodone, oxymorphone, hydrocodone, hydromorphone, meperidine, tramadol, tapentadol, buprenorphine, fentanyl, methadone. **Benzodiazepine medications include: diazepam (Valium), alprazolam (Xanax), clonazepam (Klonopine), lorazepam (Ativan), clorazepate (Tranxene), chlordiazepoxide (Librium), estazolam (Prosom),  oxazepam (Serax), temazepam (Restoril), triazolam (Halcion) (Last updated: 09/02/2017) ____________________________________________________________________________________________    

## 2018-06-20 NOTE — Progress Notes (Signed)
Patient's Name: Derek [name]  MRN: [id number]  Referring Provider: Jodi Marble, MD  DOB: 1980-11-25  PCP: Jodi Marble, MD  DOS: 06/20/2018  Note by: Vevelyn Francois NP  Service setting: Ambulatory outpatient  Specialty: Interventional Pain Management  Location: [facility] (AMB) Pain Management Facility    Patient type: Established    Primary Reason(s) for Visit: Encounter for prescription drug management. (Level of risk: moderate)  CC: Hip Pain  HPI  [name] is a 37 y.o. year old, male patient, who comes today for a medication management evaluation. He has Lumbar pseudoarthrosis (L5-S1); Tobacco use; Chronic low back pain (Primary Area of Pain) (Bilateral) (L>R) w/ sciatica (Bilateral); Chronic lower extremity pain (Secondary Area of Pain) (Bilateral) (L>R); Chronic pain syndrome; Long term current use of opiate analgesic; Pharmacologic therapy; Disorder of skeletal system; Problems influencing health status; Elevated C-reactive protein (CRP); Vitamin D insufficiency; Failed back surgical syndrome (x3); L5-S1 pseudoarthrosis; Chronic musculoskeletal pain; Spasm of back muscles; Sacroiliac joint dysfunction (Bilateral); Chronic sacroiliac joint pain (Bilateral); Somatic dysfunction of sacroiliac joints (Bilateral); Chronic hip pain (Bilateral); Epidural fibrosis; Lumbar postlaminectomy syndrome; DDD (degenerative disc disease), lumbosacral; Lumbar facet syndrome (Bilateral); Other specified dorsopathies, sacral and sacrococcygeal region; Spondylosis without myelopathy or radiculopathy, lumbosacral region; and Neurogenic pain on their problem list. His primarily concern today is the Hip Pain  Pain Assessment: Location: Left Hip Radiating: pain shoot down the back of my leg, alternate between legs but more on the left  Patient's Name: Derek [name]  MRN: [id number]  Referring Provider: Jodi Marble, MD  DOB: 1981/03/21  PCP: Jodi Marble, MD  DOS: 06/20/2018  Note by: Vevelyn Francois NP  Service setting: Ambulatory outpatient  Specialty: Interventional Pain Management  Location: [facility] (AMB) Pain Management Facility    Patient type: Established    Primary Reason(s) for Visit: Encounter for prescription drug management. (Level of risk: moderate)  CC: Hip Pain  HPI  [name] is a 37 y.o. year old, male patient, who comes today for a medication management evaluation. He has Lumbar pseudoarthrosis (L5-S1); Tobacco use; Chronic low back pain (Primary Area of Pain) (Bilateral) (L>R) w/ sciatica (Bilateral); Chronic lower extremity pain (Secondary Area of Pain) (Bilateral) (L>R); Chronic pain syndrome; Long term current use of opiate analgesic; Pharmacologic therapy; Disorder of skeletal system; Problems influencing health status; Elevated C-reactive protein (CRP); Vitamin D insufficiency; Failed back surgical syndrome (x3); L5-S1 pseudoarthrosis; Chronic musculoskeletal pain; Spasm of back muscles; Sacroiliac joint dysfunction (Bilateral); Chronic sacroiliac joint pain (Bilateral); Somatic dysfunction of sacroiliac joints (Bilateral); Chronic hip pain (Bilateral); Epidural fibrosis; Lumbar postlaminectomy syndrome; DDD (degenerative disc disease), lumbosacral; Lumbar facet syndrome (Bilateral); Other specified dorsopathies, sacral and sacrococcygeal region; Spondylosis without myelopathy or radiculopathy, lumbosacral region; and Neurogenic pain on their problem list. His primarily concern today is the Hip Pain  Pain Assessment: Location: Left Hip Radiating: pain shoot down the back of my leg, alternate between legs but more on the left Onset: More than a month ago Duration: Chronic pain Quality: Aching, Constant, Throbbing, Shooting Severity: 2 /10 (subjective, self-reported pain score)  Note: Reported level is compatible with observation.                          Effect on ADL: limits my daily activities Timing: Constant Modifying factors: medications, heat, ice  procedures BP: 134/90  HR: (!) 108  [name] was last scheduled for an appointment on 04/25/2018 for medication management.  During today's appointment we reviewed [name] chronic pain status, as well as his outpatient medication regimen. He admits that the pain goes down into his knee. He admits that stretching helps some. He denies any weakness. He admits that the use of the vitamins has allowed him to decrease the use of the muscle relaxer's.  The patient  reports no history of drug use. His body mass index is 25.97 kg/m.  Further details on both, my assessment(s), as well as the proposed treatment plan, please see below.  Controlled Substance Pharmacotherapy Assessment REMS (Risk Evaluation and Mitigation Strategy)  Analgesic: Oxycodone IR 10 mg oxycodone q6hrs (40 mg/day) MME/day:63m/day  BChauncey Fischer RN  06/20/2018  9:02 AM  Sign when Signing Visit Nursing Pain Medication Assessment:  Safety precautions to be maintained throughout the outpatient stay will include: orient to surroundings, keep bed in low position, maintain call bell within reach at all times, provide assistance with transfer out of bed and ambulation.  Medication Inspection Compliance: Pill count conducted under aseptic conditions, in front of the patient. Neither the pills nor the bottle was removed from the patient's sight at any time. Once count was completed pills were immediately returned to the patient in their original bottle.  Medication: Oxycodone IR Pill/Patch Count: 23 of 120 pills remain Pill/Patch Appearance: Markings consistent with prescribed medication Bottle Appearance: Standard pharmacy container. Clearly labeled. Filled Date: 174/ 22 / 2019 Last Medication intake:  Today   Pharmacokinetics: Liberation and absorption (onset of action): WNL Distribution (time to peak effect): WNL Metabolism and excretion (duration of action): WNL         Pharmacodynamics: Desired effects: Analgesia:  [name] >50% benefit. Functional ability: Patient reports that medication allows him to accomplish basic ADLs Clinically meaningful improvement in function (CMIF): Sustained CMIF goals met Perceived effectiveness: Described as relatively effective, allowing for increase in activities of daily living (ADL) Undesirable effects: Side-effects or Adverse reactions: None reported Monitoring: [place] PMP: Online review of the past 185-montheriod conducted. Compliant with practice rules and regulations Last UDS on record: Summary  Date Value Ref Range Status  04/21/2018 FINAL  Final    Comment:    ==================================================================== TOXASSURE SELECT 13 (MW) ==================================================================== Test                             Result       Flag       Units Drug Present and Declared for Prescription Verification   Oxycodone                      1156         EXPECTED   ng/mg creat   Oxymorphone                    3549         EXPECTED   ng/mg creat   Noroxycodone                   1392         EXPECTED   ng/mg creat   Noroxymorphone                 352          EXPECTED   ng/mg creat    Sources of oxycodone are scheduled prescription medications.    Oxymorphone, noroxycodone, and noroxymorphone are expected    metabolites of  oxycodone. Oxymorphone is also available as a    scheduled prescription medication. Drug Absent but Declared for Prescription Verification   Hydrocodone                    Not Detected UNEXPECTED ng/mg creat ==================================================================== Test                      Result    Flag   Units      Ref Range   Creatinine              154              mg/dL      >=20 ==================================================================== Declared Medications:  The flagging and interpretation on this report are based on the  following declared medications.  Unexpected results  may arise from  inaccuracies in the declared medications.  **Note: The testing scope of this panel includes these medications:  Hydrocodone (Vicodin)  Oxycodone (Percocet)  **Note: The testing scope of this panel does not include following  reported medications:  Acetaminophen (Percocet)  Acetaminophen (Vicodin)  Calcium carbonate (Calcium Carb/Vitamin D)  Cyclobenzaprine (Flexeril)  Magnesium (Mag-Ox)  Tizanidine  Vitamin D (Calcium Carb/Vitamin D)  Vitamin D3 ==================================================================== For clinical consultation, please call [phone]. ====================================================================    UDS interpretation: Compliant          Medication Assessment Form: Reviewed. Patient indicates being compliant with therapy Treatment compliance: Compliant Risk Assessment Profile: Aberrant behavior: See prior evaluations. None observed or detected today Comorbid factors increasing risk of overdose: caucasian and male gender Opioid risk tool (ORT) (Total Score): 1 Personal History of Substance Abuse (SUD-Substance use disorder):  Alcohol: Negative  Illegal Drugs: Negative  Rx Drugs: Negative  ORT Risk Level calculation: Low Risk Risk of substance use disorder (SUD): Low Opioid Risk Tool - 06/20/18 0911      Family History of Substance Abuse   Alcohol  Negative    Illegal Drugs  Negative    Rx Drugs  Negative      Personal History of Substance Abuse   Alcohol  Negative    Illegal Drugs  Negative    Rx Drugs  Negative      Age   Age between 5-45 years   Yes      History of Preadolescent Sexual Abuse   History of Preadolescent Sexual Abuse  Negative or Male      Psychological Disease   Psychological Disease  Negative    Depression  Negative      Total Score   Opioid Risk Tool Scoring  1    Opioid Risk Interpretation  Low Risk      ORT Scoring interpretation table:  Score <3 = Low Risk for SUD  Score between  4-7 = Moderate Risk for SUD  Score >8 = High Risk for Opioid Abuse   Risk Mitigation Strategies:  Patient Counseling: Covered Patient-Prescriber Agreement (PPA): Present and active  Notification to other healthcare providers: Done  Pharmacologic Plan: No change in therapy, at this time.             Laboratory Chemistry  Inflammation Markers (CRP: Acute Phase) (ESR: Chronic Phase) Lab Results  Component Value Date   CRP 13 (H) 01/24/2018   ESRSEDRATE 8 01/24/2018                         Rheumatology Markers No results found for: RF, ANA, LABURIC, URICUR,  LYMEIGGIGMAB, LYMEABIGMQN, HLAB27                      Renal Function Markers Lab Results  Component Value Date   BUN 8 01/24/2018   CREATININE 1.02 01/24/2018   BCR 8 (L) 01/24/2018   GFRAA 107 01/24/2018   GFRNONAA 93 01/24/2018                             Hepatic Function Markers Lab Results  Component Value Date   AST 22 01/24/2018   ALBUMIN 4.4 01/24/2018   ALKPHOS 101 01/24/2018                        Electrolytes Lab Results  Component Value Date   NA 144 01/24/2018   K 4.1 01/24/2018   CL 104 01/24/2018   CALCIUM 9.9 01/24/2018   MG 2.1 01/24/2018                        Neuropathy Markers Lab Results  Component Value Date   VITAMINB12 462 01/24/2018                        CNS Tests No results found for: COLORCSF, APPEARCSF, RBCCOUNTCSF, WBCCSF, POLYSCSF, LYMPHSCSF, EOSCSF, PROTEINCSF, GLUCCSF, JCVIRUS, CSFOLI, IGGCSF                      Bone Pathology Markers Lab Results  Component Value Date   25OHVITD1 26 (L) 01/24/2018   25OHVITD2 <1.0 01/24/2018   25OHVITD3 26 01/24/2018                         Coagulation Parameters Lab Results  Component Value Date   PLT 248 02/21/2015                        Cardiovascular Markers Lab Results  Component Value Date   HGB 13.1 02/21/2015   HCT 39.9 02/21/2015                         CA Markers No results found for: CEA, CA125, LABCA2                       Note: Lab results reviewed.  Recent Diagnostic Imaging Results  DG C-Arm 1-60 Min-No Report Fluoroscopy was utilized by the requesting physician.  No radiographic  interpretation.   Complexity Note: Imaging results reviewed. Results shared with [name], using Layman's terms.                         Meds   Current Outpatient Medications:  .  Calcium Carbonate-Vit D-Min (GNP CALCIUM 1200) 1200-1000 MG-UNIT CHEW, Chew 1,200 mg by mouth daily with breakfast. Take in combination with vitamin D and magnesium., Disp: 30 tablet, Rfl: 5 .  Cholecalciferol (VITAMIN D3) 5000 units CAPS, Take 1 capsule (5,000 Units total) by mouth daily with breakfast. Take along with calcium and magnesium., Disp: 30 capsule, Rfl: 5 .  cyclobenzaprine (FLEXERIL) 10 MG tablet, Take 1 tablet (10 mg total) by mouth at bedtime., Disp: 30 tablet, Rfl: 2 .  Magnesium 500 MG CAPS, Take 1 capsule (500 mg total) by mouth 2 (two) times daily at 8 am  and 10 pm., Disp: 60 capsule, Rfl: 5 .  [START ON 06/26/2018] Oxycodone HCl 10 MG TABS, Take 1 tablet (10 mg total) by mouth every 6 (six) hours as needed (Take only if needed, not around-the-clock.). Must last 30 days., Disp: 120 tablet, Rfl: 0 .  tiZANidine (ZANAFLEX) 4 MG tablet, Take 1 tablet (4 mg total) by mouth 2 (two) times daily as needed for muscle spasms (For muscle pain/spasm during the day.)., Disp: 60 tablet, Rfl: 2  ROS  Constitutional: Denies any fever or chills Gastrointestinal: No reported hemesis, hematochezia, vomiting, or acute GI distress Musculoskeletal: Denies any acute onset joint swelling, redness, loss of ROM, or weakness Neurological: No reported episodes of acute onset apraxia, aphasia, dysarthria, agnosia, amnesia, paralysis, loss of coordination, or loss of consciousness  Allergies  [name] is allergic to amoxicillin and vicodin [hydrocodone-acetaminophen].  [place]  Drug: [name]  reports no history of drug use. Alcohol:  reports  current alcohol use. Tobacco:  reports that he has been smoking. He has a 16.00 pack-year smoking history. He has never used smokeless tobacco. Medical:  has a past medical history of Anxiety, Arthritis, and GERD (gastroesophageal reflux disease). Surgical: [name]  has a past surgical history that includes Back surgery; HAND REIMPLANTED; and Lumbar fusion (11/14). Family: family history is not on file.  Constitutional Exam  General appearance: Well nourished, well developed, and well hydrated. In no apparent acute distress Vitals:   06/20/18 0903  BP: 134/90  Pulse: (!) 108  Temp: 98 F (36.7 C)  SpO2: 95%  Weight: 181 lb (82.1 kg)  Height: _0  (1.778 m)  Psych/Mental status: Alert, oriented x 3 (person, place, & time)       Eyes: PERLA Respiratory: No evidence of acute respiratory distress  Lumbar Spine Area Exam  Skin & Axial Inspection: Well healed scar from previous spine surgery detected Alignment: Symmetrical Functional ROM: Unrestricted ROM       Stability: No instability detected Muscle Tone/Strength: Functionally intact. No obvious neuro-muscular anomalies detected. Sensory (Neurological): Unimpaired Palpation: Uncomfortable         Gait & Posture Assessment  Ambulation: Unassisted Gait: Relatively normal for age and body habitus Posture: WNL   Lower Extremity Exam    Side: Right lower extremity  Side: Left lower extremity  Stability: No instability observed          Stability: No instability observed          Skin & Extremity Inspection: Skin color, temperature, and hair growth are WNL. No peripheral edema or cyanosis. No masses, redness, swelling, asymmetry, or associated skin lesions. No contractures.  Skin & Extremity Inspection: Skin color, temperature, and hair growth are WNL. No peripheral edema or cyanosis. No masses, redness, swelling, asymmetry, or associated skin lesions. No contractures.  Functional ROM: Unrestricted ROM                  Functional  ROM: Unrestricted ROM                  Muscle Tone/Strength: Functionally intact. No obvious neuro-muscular anomalies detected.  Muscle Tone/Strength: Functionally intact. No obvious neuro-muscular anomalies detected.  Sensory (Neurological): Unimpaired        Sensory (Neurological): Unimpaired            Palpation: No palpable anomalies  Palpation: No palpable anomalies   Assessment  Primary Diagnosis & Pertinent Problem List: The primary encounter diagnosis was Spondylosis without myelopathy or radiculopathy, lumbosacral region. Diagnoses of  Chronic musculoskeletal pain, Spasm of back muscles, and Chronic pain syndrome were also pertinent to this visit.  Status Diagnosis  Persistent Responding Responding 1. Spondylosis without myelopathy or radiculopathy, lumbosacral region   2. Chronic musculoskeletal pain   3. Spasm of back muscles   4. Chronic pain syndrome     Problems updated and reviewed during this visit: No problems updated. Plan of Care  Pharmacotherapy (Medications Ordered): Meds ordered this encounter  Medications  . Oxycodone HCl 10 MG TABS    Sig: Take 1 tablet (10 mg total) by mouth every 6 (six) hours as needed (Take only if needed, not around-the-clock.). Must last 30 days.    Dispense:  120 tablet    Refill:  0    Do not place this medication, or any other prescription from our practice, on "Automatic Refill". Patient may have prescription filled one day early if pharmacy is closed on scheduled refill date.    Order Specific Question:   Supervising Provider    Answer:   Milinda Pointer [[id number]]   New Prescriptions   No medications on file   Medications administered today: Derek [name] had no medications administered during this visit. Lab-work, procedure(s), and/or referral(s): No orders of the defined types were placed in this encounter.  Imaging and/or referral(s): None  Interventional management options: Planned, scheduled, and/or pending:    None at this time.     Considering:   Diagnostic bilateral lumbar facet block Possible bilateral lumbar facet RFA Diagnostic bilateral sacroiliac joint block Possible bilateral sacroiliac joint RFA Possible spinal cord stimulator trial   Palliative PRN treatment(s):   Therapeutic Racz procedure #2(no sooner than 6 months from 04/07/18) Diagnostic bilateral sacroiliac joint block #1 under fluoroscopic guidance    Provider-requested follow-up: Return in about 4 weeks (around 07/18/2018) for MedMgmt.  Future Appointments  Date Time Provider [place]  07/21/2018  8:00 AM Vevelyn Francois, NP [facility] None   Primary Care Physician: Jodi Marble, MD Location: [facility] Note by: Vevelyn Francois NP Date: 06/20/2018; Time: 10:51 AM  Pain Score Disclaimer: We use the NRS-11 scale. This is a self-reported, subjective measurement of pain severity with only modest accuracy. It is used primarily to identify changes within a particular patient. It must be understood that outpatient pain scales are significantly less accurate that those used for research, where they can be applied under ideal controlled circumstances with minimal exposure to variables. In reality, the score is likely to be a combination of pain intensity and pain affect, where pain affect describes the degree of emotional arousal or changes in action readiness caused by the sensory experience of pain. Factors such as social and work situation, setting, emotional state, anxiety levels, expectation, and prior pain experience may influence pain perception and show large inter-individual differences that may also be affected by time variables.  Patient instructions provided during this appointment: Patient Instructions  ____________________________________________________________________________________________  Medication Rules  Purpose: To inform patients, and their family  members, of our rules and regulations.  Applies to: All patients receiving prescriptions (written or electronic).  Pharmacy of record: Pharmacy where electronic prescriptions will be sent. If written prescriptions are taken to a different pharmacy, please inform the nursing staff. The pharmacy listed in the electronic medical record should be the one where you would like electronic prescriptions to be sent.  Electronic prescriptions: In compliance with the [place] (STOP) Act of 2017 (Session Law 2017-74/H243), effective July 06, 2018, all controlled substances must be electronically prescribed. Calling prescriptions to the pharmacy will cease to exist.  Prescription refills: Only during scheduled appointments. Applies to all prescriptions.  NOTE: The following applies primarily to controlled substances (Opioid* Pain Medications).   Patient's responsibilities: 1. Pain Pills: Bring all pain pills to every appointment (except for procedure appointments). 2. Pill Bottles: Bring pills in original pharmacy bottle. Always bring the newest bottle. Bring bottle, even if empty. 3. Medication refills: You are responsible for knowing and keeping track of what medications you take and those you need refilled. The day before your appointment: write a list of all prescriptions that need to be refilled. The day of the appointment: give the list to the admitting nurse. Prescriptions will be written only during appointments. If you forget a medication: it will not be "Called in", "Faxed", or "electronically sent". You will need to get another appointment to get these prescribed. No early refills. Do not call asking to have your prescription filled early. 4. Prescription Accuracy: You are responsible for carefully inspecting your prescriptions before leaving our office. Have the discharge nurse carefully go over each prescription with you, before taking them home. Make  sure that your name is accurately spelled, that your address is correct. Check the name and dose of your medication to make sure it is accurate. Check the number of pills, and the written instructions to make sure they are clear and accurate. Make sure that you are given enough medication to last until your next medication refill appointment. 5. Taking Medication: Take medication as prescribed. When it comes to controlled substances, taking less pills or less frequently than prescribed is permitted and encouraged. Never take more pills than instructed. Never take medication more frequently than prescribed.  6. Inform other Doctors: Always inform, all of your healthcare providers, of all the medications you take. 7. Pain Medication from other Providers: You are not allowed to accept any additional pain medication from any other Doctor or Healthcare provider. There are two exceptions to this rule. (see below) In the event that you require additional pain medication, you are responsible for notifying us, as stated below. 8. Medication Agreement: You are responsible for carefully reading and following our Medication Agreement. This must be signed before receiving any prescriptions from our practice. Safely store a copy of your signed Agreement. Violations to the Agreement will result in no further prescriptions. (Additional copies of our Medication Agreement are available upon request.) 9. Laws, Rules, & Regulations: All patients are expected to follow all Federal and [employer], Rules, [employer]. Ignorance of the Laws does not constitute a valid excuse. The use of any illegal substances is prohibited. 10. Adopted CDC guidelines & recommendations: Target dosing levels will be at or below 60 MME/day. Use of benzodiazepines** is not recommended.  Exceptions: There are only two exceptions to the rule of not receiving pain medications from other Healthcare Providers. 1. Exception #1 (Emergencies):  In the event of an emergency (i.e.: accident requiring emergency care), you are allowed to receive additional pain medication. However, you are responsible for: As soon as you are able, call our office [phone], at any time of the day or night, and leave a message stating your name, the date and nature of the emergency, and the name and dose of the medication prescribed. In the event that your call is answered by a member of our staff, make sure to document and save the date, time, and the name of the  person that took your information.  2. Exception #2 (Planned Surgery): In the event that you are scheduled by another doctor or dentist to have any type of surgery or procedure, you are allowed (for a period no longer than 30 days), to receive additional pain medication, for the acute post-op pain. However, in this case, you are responsible for picking up a copy of our "Post-op Pain Management for Surgeons" handout, and giving it to your surgeon or dentist. This document is available at our office, and does not require an appointment to obtain it. Simply go to our office during business hours (Monday-Thursday from 8:00 AM to 4:00 PM) (Friday 8:00 AM to 12:00 Noon) or if you have a scheduled appointment with [place], prior to your surgery, and ask for it by name. In addition, you will need to provide [place] with your name, name of your surgeon, type of surgery, and date of procedure or surgery.  *Opioid medications include: morphine, codeine, oxycodone, oxymorphone, hydrocodone, hydromorphone, meperidine, tramadol, tapentadol, buprenorphine, fentanyl, methadone. **Benzodiazepine medications include: diazepam (Valium), alprazolam (Xanax), clonazepam (Klonopine), lorazepam (Ativan), clorazepate (Tranxene), chlordiazepoxide (Librium), estazolam (Prosom), oxazepam (Serax), temazepam (Restoril), triazolam (Halcion) (Last updated:  09/02/2017) ____________________________________________________________________________________________

## 2018-07-21 ENCOUNTER — Encounter: Payer: Self-pay | Admitting: Nurse Practitioner

## 2018-07-21 ENCOUNTER — Other Ambulatory Visit: Payer: Self-pay

## 2018-07-21 ENCOUNTER — Ambulatory Visit: Payer: BLUE CROSS/BLUE SHIELD | Attending: Nurse Practitioner | Admitting: Nurse Practitioner

## 2018-07-21 VITALS — BP 124/78 | HR 115 | Temp 97.1°F | Ht 70.0 in | Wt 181.0 lb

## 2018-07-21 DIAGNOSIS — M7918 Myalgia, other site: Secondary | ICD-10-CM | POA: Insufficient documentation

## 2018-07-21 DIAGNOSIS — G894 Chronic pain syndrome: Secondary | ICD-10-CM | POA: Insufficient documentation

## 2018-07-21 DIAGNOSIS — M62830 Muscle spasm of back: Secondary | ICD-10-CM | POA: Diagnosis present

## 2018-07-21 DIAGNOSIS — M5442 Lumbago with sciatica, left side: Secondary | ICD-10-CM | POA: Diagnosis present

## 2018-07-21 DIAGNOSIS — G8929 Other chronic pain: Secondary | ICD-10-CM | POA: Insufficient documentation

## 2018-07-21 DIAGNOSIS — M5441 Lumbago with sciatica, right side: Secondary | ICD-10-CM | POA: Diagnosis present

## 2018-07-21 DIAGNOSIS — E559 Vitamin D deficiency, unspecified: Secondary | ICD-10-CM | POA: Insufficient documentation

## 2018-07-21 DIAGNOSIS — M47817 Spondylosis without myelopathy or radiculopathy, lumbosacral region: Secondary | ICD-10-CM | POA: Insufficient documentation

## 2018-07-21 MED ORDER — OXYCODONE HCL 10 MG PO TABS: 10.0000 mg | ORAL_TABLET | Freq: Four times a day (QID) | ORAL | 0 refills | Status: DC | PRN

## 2018-07-21 MED ORDER — VITAMIN D3 125 MCG (5000 UT) PO CAPS: 1.0000 | ORAL_CAPSULE | Freq: Every day | ORAL | 5 refills | Status: DC

## 2018-07-21 MED ORDER — MAGNESIUM 500 MG PO CAPS: 500.0000 mg | ORAL_CAPSULE | Freq: Two times a day (BID) | ORAL | 5 refills | Status: DC

## 2018-07-21 MED ORDER — TIZANIDINE HCL 4 MG PO TABS: 4.0000 mg | ORAL_TABLET | Freq: Two times a day (BID) | ORAL | 2 refills | Status: DC | PRN

## 2018-07-21 MED ORDER — CYCLOBENZAPRINE HCL 10 MG PO TABS: 10.0000 mg | ORAL_TABLET | Freq: Every day | ORAL | 2 refills | Status: DC

## 2018-07-21 MED ORDER — GNP CALCIUM 1200 1200-1000 MG-UNIT PO CHEW: 1200.0000 mg | CHEWABLE_TABLET | Freq: Every day | ORAL | 5 refills | Status: DC

## 2018-07-21 NOTE — Progress Notes (Signed)
Nursing Pain Medication Assessment:  Safety precautions to be maintained throughout the outpatient stay will include: orient to surroundings, keep bed in low position, maintain call bell within reach at all times, provide assistance with transfer out of bed and ambulation.  Medication Inspection Compliance: Pill count conducted under aseptic conditions, in front of the patient. Neither the pills nor the bottle was removed from the patient's sight at any time. Once count was completed pills were immediately returned to the patient in their original bottle.  Medication: Oxycodone IR Pill/Patch Count: 17 of 120 pills remain Pill/Patch Appearance: Markings consistent with prescribed medication Bottle Appearance: Standard pharmacy container. Clearly labeled. Filled Date: 35 / 21 / 2019 Last Medication intake:  Today

## 2018-07-21 NOTE — Patient Instructions (Signed)
____________________________________________________________________________________________  Medication Rules  Purpose: To inform patients, and their family members, of our rules and regulations.  Applies to: All patients receiving prescriptions (written or electronic).  Pharmacy of record: Pharmacy where electronic prescriptions will be sent. If written prescriptions are taken to a different pharmacy, please inform the nursing staff. The pharmacy listed in the electronic medical record should be the one where you would like electronic prescriptions to be sent.  Electronic prescriptions: In compliance with the [place] [employer]reng[employer] Opioid Misuse Prevention (STOP) Act of 2017 (Session Law 2017-74/H243), effective July 06, 2018, all controlled substances must be electronically prescribed. Calling prescriptions to the pharmacy will cease to exist.  Prescription refills: Only during scheduled appointments. Applies to all prescriptions.  NOTE: The following applies primarily to controlled substances (Opioid* Pain Medications).   Patient's responsibilities: 1. Pain Pills: Bring all pain pills to every appointment (except for procedure appointments). 2. Pill Bottles: Bring pills in original pharmacy bottle. Always bring the newest bottle. Bring bottle, even if empty. 3. Medication refills: You are responsible for knowing and keeping track of what medications you take and those you need refilled. The day before your appointment: write a list of all prescriptions that need to be refilled. The day of the appointment: give the list to the admitting nurse. Prescriptions will be written only during appointments. If you forget a medication: it will not be "Called in", "Faxed", or "electronically sent". You will need to get another appointment to get these prescribed. No early refills. Do not call asking to have your prescription filled early. 4. Prescription Accuracy: You are responsible for  carefully inspecting your prescriptions before leaving our office. Have the discharge nurse carefully go over each prescription with you, before taking them home. Make sure that your name is accurately spelled, that your address is correct. Check the name and dose of your medication to make sure it is accurate. Check the number of pills, and the written instructions to make sure they are clear and accurate. Make sure that you are given enough medication to last until your next medication refill appointment. 5. Taking Medication: Take medication as prescribed. When it comes to controlled substances, taking less pills or less frequently than prescribed is permitted and encouraged. Never take more pills than instructed. Never take medication more frequently than prescribed.  6. Inform other Doctors: Always inform, all of your healthcare providers, of all the medications you take. 7. Pain Medication from other Providers: You are not allowed to accept any additional pain medication from any other Doctor or Healthcare provider. There are two exceptions to this rule. (see below) In the event that you require additional pain medication, you are responsible for notifying us, as stated below. 8. Medication Agreement: You are responsible for carefully reading and following our Medication Agreement. This must be signed before receiving any prescriptions from our practice. Safely store a copy of your signed Agreement. Violations to the Agreement will result in no further prescriptions. (Additional copies of our Medication Agreement are available upon request.) 9. Laws, Rules, & Regulations: All patients are expected to follow all Federal and State Laws, Statutes, Rules, & Regulations. Ignorance of the Laws does not constitute a valid excuse. The use of any illegal substances is prohibited. 10. Adopted CDC guidelines & recommendations: Target dosing levels will be at or below 60 MME/day. Use of benzodiazepines** is not  recommended.  Exceptions: There are only two exceptions to the rule of not receiving pain medications from other Healthcare Providers. 1.   Exception #1 (Emergencies): In the event of an emergency (i.e.: accident requiring emergency care), you are allowed to receive additional pain medication. However, you are responsible for: As soon as you are able, call our office [phone], at any time of the day or night, and leave a message stating your name, the date and nature of the emergency, and the name and dose of the medication prescribed. In the event that your call is answered by a member of our staff, make sure to document and save the date, time, and the name of the person that took your information.  2. Exception #2 (Planned Surgery): In the event that you are scheduled by another doctor or dentist to have any type of surgery or procedure, you are allowed (for a period no longer than 30 days), to receive additional pain medication, for the acute post-op pain. However, in this case, you are responsible for picking up a copy of our "Post-op Pain Management for Surgeons" handout, and giving it to your surgeon or dentist. This document is available at our office, and does not require an appointment to obtain it. Simply go to our office during business hours (Monday-Thursday from 8:00 AM to 4:00 PM) (Friday 8:00 AM to 12:00 Noon) or if you have a scheduled appointment with us, prior to your surgery, and ask for it by name. In addition, you will need to provide us with your name, name of your surgeon, type of surgery, and date of procedure or surgery.  *Opioid medications include: morphine, codeine, oxycodone, oxymorphone, hydrocodone, hydromorphone, meperidine, tramadol, tapentadol, buprenorphine, fentanyl, methadone. **Benzodiazepine medications include: diazepam (Valium), alprazolam (Xanax), clonazepam (Klonopine), lorazepam (Ativan), clorazepate (Tranxene), chlordiazepoxide (Librium), estazolam (Prosom),  oxazepam (Serax), temazepam (Restoril), triazolam (Halcion) (Last updated: 09/02/2017) ____________________________________________________________________________________________    

## 2018-07-21 NOTE — Progress Notes (Signed)
Patient's Name: Derek [name]  MRN: [id number]  Referring Provider: Jodi Marble, MD  DOB: [birth year]  PCP: Jodi Marble, MD  DOS: 07/21/2018  Note by: Vevelyn Francois NP  Service setting: Ambulatory outpatient  Specialty: Interventional Pain Management  Location: [facility] (AMB) Pain Management Facility    Patient type: Established    Primary Reason(s) for Visit: Encounter for prescription drug management. (Level of risk: moderate)  CC: Back Pain  HPI  Derek [name] is a 38 y.o. year old, male patient, who comes today for a medication management evaluation. He has Lumbar pseudoarthrosis (L5-S1); Tobacco use; Chronic low back pain (Primary Area of Pain) (Bilateral) (L>R) w/ sciatica (Bilateral); Chronic lower extremity pain (Secondary Area of Pain) (Bilateral) (L>R); Chronic pain syndrome; Long term current use of opiate analgesic; Pharmacologic therapy; Disorder of skeletal system; Problems influencing health status; Elevated C-reactive protein (CRP); Vitamin D insufficiency; Failed back surgical syndrome (x3); L5-S1 pseudoarthrosis; Chronic musculoskeletal pain; Spasm of back muscles; Sacroiliac joint dysfunction (Bilateral); Chronic sacroiliac joint pain (Bilateral); Somatic dysfunction of sacroiliac joints (Bilateral); Chronic hip pain (Bilateral); Epidural fibrosis; Lumbar postlaminectomy syndrome; DDD (degenerative disc disease), lumbosacral; Lumbar facet syndrome (Bilateral); Other specified dorsopathies, sacral and sacrococcygeal region; Spondylosis without myelopathy or radiculopathy, lumbosacral region; and Neurogenic pain on their problem list. His primarily concern today is the Back Pain  Pain Assessment: Location: Left, Right Back(hip) Radiating: pain radiaties down both leg more on the left Onset: More than a month ago Duration: Chronic pain Quality: Aching, Constant, Throbbing, Shooting Severity: 3 /10 (subjective, self-reported pain score)  Note: Reported level is compatible  with observation.                          Effect on ADL: limits my daily activities Timing: Constant Modifying factors: medication, heat, ice, procedures BP: 124/78  HR: (!) 115  Derek [name] was last scheduled for an appointment on 06/20/2018 for medication management. During today's appointment we reviewed Derek [name] chronic pain status, as well as his outpatient medication regimen. He is having jabbing type pain with sitting or standing for long periods. He is scheduled for an injection on Tuesday.   The patient  reports no history of drug use. His body mass index is 25.97 kg/m.  Further details on both, my assessment(s), as well as the proposed treatment plan, please see below.  Controlled Substance Pharmacotherapy Assessment REMS (Risk Evaluation and Mitigation Strategy)  Analgesic: Oxycodone IR 10 mg oxycodone q6hrs (40 mg/day) MME/day:95m/day BChauncey Fischer RN  07/21/2018  8:14 AM  Sign when Signing Visit Nursing Pain Medication Assessment:  Safety precautions to be maintained throughout the outpatient stay will include: orient to surroundings, keep bed in low position, maintain call bell within reach at all times, provide assistance with transfer out of bed and ambulation.  Medication Inspection Compliance: Pill count conducted under aseptic conditions, in front of the patient. Neither the pills nor the bottle was removed from the patient's sight at any time. Once count was completed pills were immediately returned to the patient in their original bottle.  Medication: Oxycodone IR Pill/Patch Count: 17 of 120 pills remain Pill/Patch Appearance: Markings consistent with prescribed medication Bottle Appearance: Standard pharmacy container. Clearly labeled. Filled Date: 150/ 21 / 2019 Last Medication intake:  Today   Pharmacokinetics: Liberation and absorption (onset of action): WNL Distribution (time to peak effect): WNL Metabolism and excretion (duration of action): WNL  Pharmacodynamics: Desired effects: Analgesia: Derek [name] reports >50% benefit. Functional ability: Patient reports that medication allows him to accomplish basic ADLs Clinically meaningful improvement in function (CMIF): Sustained CMIF goals met Perceived effectiveness: Described as relatively effective, allowing for increase in activities of daily living (ADL) Undesirable effects: Side-effects or Adverse reactions: None reported Monitoring: [place] PMP: Online review of the past 67-monthperiod conducted. Compliant with practice rules and regulations Last UDS on record: Summary  Date Value Ref Range Status  04/21/2018 FINAL  Final    Comment:    ==================================================================== TOXASSURE SELECT 13 (MW) ==================================================================== Test                             Result       Flag       Units Drug Present and Declared for Prescription Verification   Oxycodone                      1156         EXPECTED   ng/mg creat   Oxymorphone                    3549         EXPECTED   ng/mg creat   Noroxycodone                   1392         EXPECTED   ng/mg creat   Noroxymorphone                 352          EXPECTED   ng/mg creat    Sources of oxycodone are scheduled prescription medications.    Oxymorphone, noroxycodone, and noroxymorphone are expected    metabolites of oxycodone. Oxymorphone is also available as a    scheduled prescription medication. Drug Absent but Declared for Prescription Verification   Hydrocodone                    Not Detected UNEXPECTED ng/mg creat ==================================================================== Test                      Result    Flag   Units      Ref Range   Creatinine              154              mg/dL      >=20 ==================================================================== Declared Medications:  The flagging and interpretation on this report are based on the   following declared medications.  Unexpected results may arise from  inaccuracies in the declared medications.  **Note: The testing scope of this panel includes these medications:  Hydrocodone (Vicodin)  Oxycodone (Percocet)  **Note: The testing scope of this panel does not include following  reported medications:  Acetaminophen (Percocet)  Acetaminophen (Vicodin)  Calcium carbonate (Calcium Carb/Vitamin D)  Cyclobenzaprine (Flexeril)  Magnesium (Mag-Ox)  Tizanidine  Vitamin D (Calcium Carb/Vitamin D)  Vitamin D3 ==================================================================== For clinical consultation, please call ([phone] ====================================================================    UDS interpretation: Compliant          Medication Assessment Form: Reviewed. Patient indicates being compliant with therapy Treatment compliance: Compliant Risk Assessment Profile: Aberrant behavior: See prior evaluations. None observed or detected today Comorbid factors increasing risk of overdose: See prior notes. No additional risks detected today Opioid risk tool (  ORT) (Total Score): 1 Personal History of Substance Abuse (SUD-Substance use disorder):  Alcohol: Negative  Illegal Drugs: Negative  Rx Drugs: Negative  ORT Risk Level calculation: Low Risk Risk of substance use disorder (SUD): Low Opioid Risk Tool - 07/21/18 0821      Family History of Substance Abuse   Alcohol  Negative    Illegal Drugs  Negative    Rx Drugs  Negative      Personal History of Substance Abuse   Alcohol  Negative    Illegal Drugs  Negative    Rx Drugs  Negative      Age   Age between 86-45 years   Yes      History of Preadolescent Sexual Abuse   History of Preadolescent Sexual Abuse  Negative or Male      Psychological Disease   Psychological Disease  Negative    Depression  Negative      Total Score   Opioid Risk Tool Scoring  1    Opioid Risk Interpretation  Low Risk       ORT Scoring interpretation table:  Score <3 = Low Risk for SUD  Score between 4-7 = Moderate Risk for SUD  Score >8 = High Risk for Opioid Abuse   Risk Mitigation Strategies:  Patient Counseling: Covered Patient-Prescriber Agreement (PPA): Present and active  Notification to other healthcare providers: Done  Pharmacologic Plan: No change in therapy, at this time.             Laboratory Chemistry  Inflammation Markers (CRP: Acute Phase) (ESR: Chronic Phase) Lab Results  Component Value Date   CRP 13 (H) 01/24/2018   ESRSEDRATE 8 01/24/2018                         Rheumatology Markers No results found for: RF, ANA, LABURIC, URICUR, LYMEIGGIGMAB, LYMEABIGMQN, HLAB27                      Renal Function Markers Lab Results  Component Value Date   BUN 8 01/24/2018   CREATININE 1.02 01/24/2018   BCR 8 (L) 01/24/2018   GFRAA 107 01/24/2018   GFRNONAA 93 01/24/2018                             Hepatic Function Markers Lab Results  Component Value Date   AST 22 01/24/2018   ALBUMIN 4.4 01/24/2018   ALKPHOS 101 01/24/2018                        Electrolytes Lab Results  Component Value Date   NA 144 01/24/2018   K 4.1 01/24/2018   CL 104 01/24/2018   CALCIUM 9.9 01/24/2018   MG 2.1 01/24/2018                        Neuropathy Markers Lab Results  Component Value Date   VITAMINB12 462 01/24/2018                        CNS Tests No results found for: COLORCSF, APPEARCSF, RBCCOUNTCSF, WBCCSF, POLYSCSF, LYMPHSCSF, EOSCSF, PROTEINCSF, GLUCCSF, JCVIRUS, CSFOLI, IGGCSF                      Bone Pathology Markers Lab Results  Component Value Date   25OHVITD1 26 (L)  01/24/2018   25OHVITD2 <1.0 01/24/2018   25OHVITD3 26 01/24/2018                         Coagulation Parameters Lab Results  Component Value Date   PLT 248 02/21/2015                        Cardiovascular Markers Lab Results  Component Value Date   HGB 13.1 02/21/2015   HCT 39.9 02/21/2015                          CA Markers No results found for: CEA, CA125, LABCA2                      Note: Lab results reviewed.  Recent Diagnostic Imaging Results  DG C-Arm 1-60 Min-No Report Fluoroscopy was utilized by the requesting physician.  No radiographic  interpretation.   Complexity Note: Imaging results reviewed. Results shared with Derek [name], using Layman's terms.                         Meds   Current Outpatient Medications:  .  Calcium Carbonate-Vit D-Min (GNP CALCIUM 1200) 1200-1000 MG-UNIT CHEW, Chew 1,200 mg by mouth daily with breakfast. Take in combination with vitamin D and magnesium., Disp: 30 tablet, Rfl: 5 .  Cholecalciferol (VITAMIN D3) 125 MCG (5000 UT) CAPS, Take 1 capsule (5,000 Units total) by mouth daily with breakfast. Take along with calcium and magnesium., Disp: 30 capsule, Rfl: 5 .  Magnesium 500 MG CAPS, Take 1 capsule (500 mg total) by mouth 2 (two) times daily at 8 am and 10 pm., Disp: 60 capsule, Rfl: 5 .  [START ON 07/26/2018] Oxycodone HCl 10 MG TABS, Take 1 tablet (10 mg total) by mouth every 6 (six) hours as needed for up to 30 days (Take only if needed, not around-the-clock.)., Disp: 120 tablet, Rfl: 0 .  cyclobenzaprine (FLEXERIL) 10 MG tablet, Take 1 tablet (10 mg total) by mouth at bedtime., Disp: 30 tablet, Rfl: 2 .  tiZANidine (ZANAFLEX) 4 MG tablet, Take 1 tablet (4 mg total) by mouth 2 (two) times daily as needed for muscle spasms (For muscle pain/spasm during the day.)., Disp: 60 tablet, Rfl: 2  ROS  Constitutional: Denies any fever or chills Gastrointestinal: No reported hemesis, hematochezia, vomiting, or acute GI distress Musculoskeletal: Denies any acute onset joint swelling, redness, loss of ROM, or weakness Neurological: No reported episodes of acute onset apraxia, aphasia, dysarthria, agnosia, amnesia, paralysis, loss of coordination, or loss of consciousness  Allergies  Derek [name] is allergic to amoxicillin and vicodin  [hydrocodone-acetaminophen].  [place]  Drug: Derek [name]  reports no history of drug use. Alcohol:  reports current alcohol use. Tobacco:  reports that he has been smoking. He has a 16.00 pack-year smoking history. He has never used smokeless tobacco. Medical:  has a past medical history of Anxiety, Arthritis, and GERD (gastroesophageal reflux disease). Surgical: Derek [name]  has a past surgical history that includes Back surgery; HAND REIMPLANTED; and Lumbar fusion (11/14). Family: family history is not on file.  Constitutional Exam  General appearance: Well nourished, well developed, and well hydrated. In no apparent acute distress Vitals:   07/21/18 0814  BP: 124/78  Pulse: (!) 115  Temp: (!) 97.1 F (36.2 C)  Weight: 181 lb (82.1 kg)  Height: '5\' 10"'  (  1.778 m)   BMI Assessment: Estimated body mass index is 25.97 kg/m as calculated from the following:   Height as of this encounter: '5\' 10"'  (1.778 m).   Weight as of this encounter: 181 lb (82.1 kg). Psych/Mental status: Alert, oriented x 3 (person, place, & time)       Eyes: PERLA Respiratory: No evidence of acute respiratory distress  Lumbar Spine Area Exam  Skin & Axial Inspection: Well healed scar from previous spine surgery detected Alignment: Symmetrical Functional ROM: Unrestricted ROM       Stability: No instability detected Muscle Tone/Strength: Functionally intact. No obvious neuro-muscular anomalies detected. Sensory (Neurological): Unimpaired Palpation: Complains of area being tender to palpation       Provocative Tests:  Gait & Posture Assessment  Ambulation: Unassisted Gait: Relatively normal for age and body habitus Posture: Antalgic   Lower Extremity Exam    Side: Right lower extremity  Side: Left lower extremity  Stability: No instability observed          Stability: No instability observed          Skin & Extremity Inspection: Skin color, temperature, and hair growth are WNL. No peripheral edema or cyanosis.  No masses, redness, swelling, asymmetry, or associated skin lesions. No contractures.  Skin & Extremity Inspection: Skin color, temperature, and hair growth are WNL. No peripheral edema or cyanosis. No masses, redness, swelling, asymmetry, or associated skin lesions. No contractures.  Functional ROM: Unrestricted ROM                  Functional ROM: Unrestricted ROM                  Muscle Tone/Strength: Functionally intact. No obvious neuro-muscular anomalies detected.  Muscle Tone/Strength: Functionally intact. No obvious neuro-muscular anomalies detected.  Sensory (Neurological): Unimpaired        Sensory (Neurological): Unimpaired        Palpation: No palpable anomalies  Palpation: No palpable anomalies   Assessment  Primary Diagnosis & Pertinent Problem List: The primary encounter diagnosis was Spondylosis without myelopathy or radiculopathy, lumbosacral region. Diagnoses of Chronic low back pain (Primary Area of Pain) (Bilateral) (L>R) w/ sciatica (Bilateral), Chronic musculoskeletal pain, Chronic pain syndrome, Spasm of back muscles, and Vitamin D insufficiency were also pertinent to this visit.  Status Diagnosis  Controlled Controlled Controlled 1. Spondylosis without myelopathy or radiculopathy, lumbosacral region   2. Chronic low back pain (Primary Area of Pain) (Bilateral) (L>R) w/ sciatica (Bilateral)   3. Chronic musculoskeletal pain   4. Chronic pain syndrome   5. Spasm of back muscles   6. Vitamin D insufficiency     Problems updated and reviewed during this visit: No problems updated. Plan of Care  Pharmacotherapy (Medications Ordered): Meds ordered this encounter  Medications  . cyclobenzaprine (FLEXERIL) 10 MG tablet    Sig: Take 1 tablet (10 mg total) by mouth at bedtime.    Dispense:  30 tablet    Refill:  2    Do not place medication on "Automatic Refill". Fill one day early if pharmacy is closed on scheduled refill date.    Order Specific Question:   Supervising  Provider    Answer:   Milinda Pointer [phone]  . tiZANidine (ZANAFLEX) 4 MG tablet    Sig: Take 1 tablet (4 mg total) by mouth 2 (two) times daily as needed for muscle spasms (For muscle pain/spasm during the day.).    Dispense:  60 tablet    Refill:  2    Do not place medication on "Automatic Refill". Fill one day early if pharmacy is closed on scheduled refill date.    Order Specific Question:   Supervising Provider    Answer:   Milinda Pointer [phone]  . Calcium Carbonate-Vit D-Min (GNP CALCIUM 1200) 1200-1000 MG-UNIT CHEW    Sig: Chew 1,200 mg by mouth daily with breakfast. Take in combination with vitamin D and magnesium.    Dispense:  30 tablet    Refill:  5    Do not place medication on "Automatic Refill".  May substitute with similar over-the-counter product.    Order Specific Question:   Supervising Provider    Answer:   Milinda Pointer [phone]  . Cholecalciferol (VITAMIN D3) 125 MCG (5000 UT) CAPS    Sig: Take 1 capsule (5,000 Units total) by mouth daily with breakfast. Take along with calcium and magnesium.    Dispense:  30 capsule    Refill:  5    Do not place medication on "Automatic Refill".  May substitute with similar over-the-counter product.    Order Specific Question:   Supervising Provider    Answer:   Milinda Pointer [phone]  . Magnesium 500 MG CAPS    Sig: Take 1 capsule (500 mg total) by mouth 2 (two) times daily at 8 am and 10 pm.    Dispense:  60 capsule    Refill:  5    Do not place medication on "Automatic Refill".  The patient may use similar over-the-counter product.    Order Specific Question:   Supervising Provider    Answer:   Milinda Pointer [phone]  . Oxycodone HCl 10 MG TABS    Sig: Take 1 tablet (10 mg total) by mouth every 6 (six) hours as needed for up to 30 days (Take only if needed, not around-the-clock.).    Dispense:  120 tablet    Refill:  0    Do not place this medication, or any other prescription from our practice, on  "Automatic Refill". Patient may have prescription filled one day early if pharmacy is closed on scheduled refill date.    Order Specific Question:   Supervising Provider    Answer:   Milinda Pointer [[id number]]   New Prescriptions   No medications on file   Medications administered today: Derek [name] had no medications administered during this visit. Lab-work, procedure(s), and/or referral(s): No orders of the defined types were placed in this encounter.  Imaging and/or referral(s): None  Interventional management options: Planned, scheduled, and/or pending: Diagnostic bilateral sacroiliac joint block as scheduled   Considering: Diagnostic bilateral lumbar facet block Possible bilateral lumbar facet RFA Diagnostic bilateral sacroiliac joint block Possible bilateral sacroiliac joint RFA Possible spinal cord stimulator trial   Palliative PRN treatment(s): Therapeutic Racz procedure #2(no sooner than 6 months from 04/07/18) Diagnostic bilateral sacroiliac joint block #1under fluoroscopic guidance    Provider-requested follow-up: Return in about 4 weeks (around 08/18/2018) for MedMgmt.  Future Appointments  Date Time Provider [place]  07/26/2018  1:15 PM Milinda Pointer, MD [facility] None  08/22/2018  8:00 AM Vevelyn Francois, NP [facility] None   Primary Care Physician: Jodi Marble, MD Location: [facility] Note by: Vevelyn Francois NP Date: 07/21/2018; Time: 12:12 PM  Pain Score Disclaimer: We use the NRS-11 scale. This is a self-reported, subjective measurement of pain severity with only modest accuracy. It is used primarily to identify changes within a particular patient. It must be  understood that outpatient pain scales are significantly less accurate that those used for research, where they can be applied under ideal controlled circumstances with minimal exposure to variables. In reality, the score is likely  to be a combination of pain intensity and pain affect, where pain affect describes the degree of emotional arousal or changes in action readiness caused by the sensory experience of pain. Factors such as social and work situation, setting, emotional state, anxiety levels, expectation, and prior pain experience may influence pain perception and show large inter-individual differences that may also be affected by time variables.  Patient instructions provided during this appointment: Patient Instructions  ____________________________________________________________________________________________  Medication Rules  Purpose: To inform patients, and their family members, of our rules and regulations.  Applies to: All patients receiving prescriptions (written or electronic).  Pharmacy of record: Pharmacy where electronic prescriptions will be sent. If written prescriptions are taken to a different pharmacy, please inform the nursing staff. The pharmacy listed in the electronic medical record should be the one where you would like electronic prescriptions to be sent.  Electronic prescriptions: In compliance with the [place] (STOP) Act of 2017 (Session Lanny Cramp [phone]), effective July 06, 2018, all controlled substances must be electronically prescribed. Calling prescriptions to the pharmacy will cease to exist.  Prescription refills: Only during scheduled appointments. Applies to all prescriptions.  NOTE: The following applies primarily to controlled substances (Opioid* Pain Medications).   Patient's responsibilities: 1. Pain Pills: Bring all pain pills to every appointment (except for procedure appointments). 2. Pill Bottles: Bring pills in original pharmacy bottle. Always bring the newest bottle. Bring bottle, even if empty. 3. Medication refills: You are responsible for knowing and keeping track of what medications you take and those you need  refilled. The day before your appointment: write a list of all prescriptions that need to be refilled. The day of the appointment: give the list to the admitting nurse. Prescriptions will be written only during appointments. If you forget a medication: it will not be "Called in", "Faxed", or "electronically sent". You will need to get another appointment to get these prescribed. No early refills. Do not call asking to have your prescription filled early. 4. Prescription Accuracy: You are responsible for carefully inspecting your prescriptions before leaving our office. Have the discharge nurse carefully go over each prescription with you, before taking them home. Make sure that your name is accurately spelled, that your address is correct. Check the name and dose of your medication to make sure it is accurate. Check the number of pills, and the written instructions to make sure they are clear and accurate. Make sure that you are given enough medication to last until your next medication refill appointment. 5. Taking Medication: Take medication as prescribed. When it comes to controlled substances, taking less pills or less frequently than prescribed is permitted and encouraged. Never take more pills than instructed. Never take medication more frequently than prescribed.  6. Inform other Doctors: Always inform, all of your healthcare providers, of all the medications you take. 7. Pain Medication from other Providers: You are not allowed to accept any additional pain medication from any other Doctor or Healthcare provider. There are two exceptions to this rule. (see below) In the event that you require additional pain medication, you are responsible for notifying us, as stated below. 8. Medication Agreement: You are responsible for carefully reading and following our Medication Agreement. This must be signed before receiving any prescriptions from our  practice. Safely store a copy of your signed Agreement.  Violations to the Agreement will result in no further prescriptions. (Additional copies of our Medication Agreement are available upon request.) 9. Laws, Rules, & Regulations: All patients are expected to follow all Federal and [employer], Rules, [employer]. Ignorance of the Laws does not constitute a valid excuse. The use of any illegal substances is prohibited. 10. Adopted CDC guidelines & recommendations: Target dosing levels will be at or below 60 MME/day. Use of benzodiazepines** is not recommended.  Exceptions: There are only two exceptions to the rule of not receiving pain medications from other Healthcare Providers. 1. Exception #1 (Emergencies): In the event of an emergency (i.e.: accident requiring emergency care), you are allowed to receive additional pain medication. However, you are responsible for: As soon as you are able, call our office [phone], at any time of the day or night, and leave a message stating your name, the date and nature of the emergency, and the name and dose of the medication prescribed. In the event that your call is answered by a member of our staff, make sure to document and save the date, time, and the name of the person that took your information.  2. Exception #2 (Planned Surgery): In the event that you are scheduled by another doctor or dentist to have any type of surgery or procedure, you are allowed (for a period no longer than 30 days), to receive additional pain medication, for the acute post-op pain. However, in this case, you are responsible for picking up a copy of our "Post-op Pain Management for Surgeons" handout, and giving it to your surgeon or dentist. This document is available at our office, and does not require an appointment to obtain it. Simply go to our office during business hours (Monday-Thursday from 8:00 AM to 4:00 PM) (Friday 8:00 AM to 12:00 Noon) or if you have a scheduled appointment with [place], prior to your surgery, and ask  for it by name. In addition, you will need to provide [place] with your name, name of your surgeon, type of surgery, and date of procedure or surgery.  *Opioid medications include: morphine, codeine, oxycodone, oxymorphone, hydrocodone, hydromorphone, meperidine, tramadol, tapentadol, buprenorphine, fentanyl, methadone. **Benzodiazepine medications include: diazepam (Valium), alprazolam (Xanax), clonazepam (Klonopine), lorazepam (Ativan), clorazepate (Tranxene), chlordiazepoxide (Librium), estazolam (Prosom), oxazepam (Serax), temazepam (Restoril), triazolam (Halcion) (Last updated: 09/02/2017) ____________________________________________________________________________________________

## 2018-07-26 ENCOUNTER — Ambulatory Visit
Admission: RE | Admit: 2018-07-26 | Discharge: 2018-07-26 | Disposition: A | Discharge status: Home / Self Care | Payer: BLUE CROSS/BLUE SHIELD | Source: Ambulatory Visit | Attending: Pain Medicine | Admitting: Pain Medicine

## 2018-07-26 ENCOUNTER — Encounter: Payer: Self-pay | Admitting: Pain Medicine

## 2018-07-26 ENCOUNTER — Ambulatory Visit (HOSPITAL_BASED_OUTPATIENT_CLINIC_OR_DEPARTMENT_OTHER): Payer: BLUE CROSS/BLUE SHIELD | Admitting: Pain Medicine

## 2018-07-26 ENCOUNTER — Other Ambulatory Visit: Payer: Self-pay

## 2018-07-26 VITALS — BP 120/80 | HR 84 | Temp 97.6°F | Resp 16 | Ht 70.0 in | Wt 191.0 lb

## 2018-07-26 DIAGNOSIS — M533 Sacrococcygeal disorders, not elsewhere classified: Secondary | ICD-10-CM

## 2018-07-26 DIAGNOSIS — G8929 Other chronic pain: Secondary | ICD-10-CM | POA: Insufficient documentation

## 2018-07-26 DIAGNOSIS — M9904 Segmental and somatic dysfunction of sacral region: Secondary | ICD-10-CM | POA: Diagnosis present

## 2018-07-26 DIAGNOSIS — M5388 Other specified dorsopathies, sacral and sacrococcygeal region: Secondary | ICD-10-CM | POA: Insufficient documentation

## 2018-07-26 MED ORDER — MIDAZOLAM HCL 5 MG/5ML IJ SOLN
1.0000 mg | INTRAMUSCULAR | Status: DC | PRN
  Filled 2018-07-26: qty 5

## 2018-07-26 MED ORDER — LIDOCAINE HCL 2 % IJ SOLN
20.0000 mL | Freq: Once | INTRAMUSCULAR | Status: AC
  Administered 2018-07-26: 400 mg
  Filled 2018-07-26: qty 40

## 2018-07-26 MED ORDER — ROPIVACAINE HCL 2 MG/ML IJ SOLN
9.0000 mL | Freq: Once | INTRAMUSCULAR | Status: AC
  Administered 2018-07-26: 9 mL via INTRA_ARTICULAR
  Filled 2018-07-26: qty 10

## 2018-07-26 MED ORDER — LACTATED RINGERS IV SOLN
1000.0000 mL | Freq: Once | INTRAVENOUS | Status: AC
  Administered 2018-07-26: 1000 mL via INTRAVENOUS

## 2018-07-26 MED ORDER — FENTANYL CITRATE (PF) 100 MCG/2ML IJ SOLN
25.0000 ug | INTRAMUSCULAR | Status: DC | PRN
  Administered 2018-07-26: 100 ug via INTRAVENOUS
  Filled 2018-07-26: qty 2

## 2018-07-26 MED ORDER — METHYLPREDNISOLONE ACETATE 80 MG/ML IJ SUSP
80.0000 mg | Freq: Once | INTRAMUSCULAR | Status: AC
  Administered 2018-07-26: 80 mg via INTRA_ARTICULAR
  Filled 2018-07-26: qty 1

## 2018-07-26 NOTE — Progress Notes (Signed)
Patient's Name: Derek [name]  MRN: [id number]  Referring Provider: Delano Metz, MD  DOB: 1981/01/20  PCP: Sherron Monday, MD  DOS: 07/26/2018  Note by: Oswaldo Done, MD  Service setting: Ambulatory outpatient  Specialty: Interventional Pain Management  Patient type: Established  Location: [facility] (AMB) Pain Management Facility  Visit type: Interventional Procedure   Primary Reason for Visit: Interventional Pain Management Treatment. CC: Back Pain  Procedure:          Anesthesia, Analgesia, Anxiolysis:  Type: Diagnostic Sacroiliac Joint Steroid Injection          Region: Superior Lumbosacral Region Level: PSIS (Posterior Superior Iliac Spine) Laterality: Bilateral  Type: Moderate (Conscious) Sedation combined with Local Anesthesia Indication(s): Analgesia and Anxiety Route: Intravenous (IV) IV Access: Secured Sedation: Meaningful verbal contact was maintained at all times during the procedure  Local Anesthetic: Lidocaine 1-2%  Position: Prone           Indications: 1. Other specified dorsopathies, sacral and sacrococcygeal region   2. Chronic sacroiliac joint pain (Bilateral)   3. Sacroiliac joint dysfunction (Bilateral)   4. Somatic dysfunction of sacroiliac joints (Bilateral)    Pain Score: Pre-procedure: 3 /10 Post-procedure: 0-No pain/10  Pre-op Assessment:  Derek [name] is a 38 y.o. (year old), male patient, seen today for interventional treatment. He  has a past surgical history that includes Back surgery; HAND REIMPLANTED; and Lumbar fusion (11/14). Derek [name] has a current medication list which includes the following prescription(s): gnp calcium 1200, vitamin d3, cyclobenzaprine, magnesium, oxycodone hcl, and tizanidine, and the following Facility-Administered Medications: fentanyl and midazolam. His primarily concern today is the Back Pain  Initial Vital Signs:  Pulse/HCG Rate: 84ECG Heart Rate: 92 Temp: 97.9 F (36.6 C) Resp: 16 BP: 116/82 SpO2: 99  %  BMI: Estimated body mass index is 27.41 kg/m as calculated from the following:   Height as of this encounter: 5\' 10"  (1.778 m).   Weight as of this encounter: 191 lb (86.6 kg).  Risk Assessment: Allergies: Reviewed. He is allergic to amoxicillin and vicodin [hydrocodone-acetaminophen].  Allergy Precautions: None required Coagulopathies: Reviewed. None identified.  Blood-thinner therapy: None at this time Active Infection(s): Reviewed. None identified. Derek [name] is afebrile  Site Confirmation: Derek [name] was asked to confirm the procedure and laterality before marking the site Procedure checklist: Completed Consent: Before the procedure and under the influence of no sedative(s), amnesic(s), or anxiolytics, the patient was informed of the treatment options, risks and possible complications. To fulfill our ethical and legal obligations, as recommended by the American Medical Association's Code of Ethics, I have informed the patient of my clinical impression; the nature and purpose of the treatment or procedure; the risks, benefits, and possible complications of the intervention; the alternatives, including doing nothing; the risk(s) and benefit(s) of the alternative treatment(s) or procedure(s); and the risk(s) and benefit(s) of doing nothing. The patient was provided information about the general risks and possible complications associated with the procedure. These may include, but are not limited to: failure to achieve desired goals, infection, bleeding, organ or nerve damage, allergic reactions, paralysis, and death. In addition, the patient was informed of those risks and complications associated to the procedure, such as failure to decrease pain; infection; bleeding; organ or nerve damage with subsequent damage to sensory, motor, and/or autonomic systems, resulting in permanent pain, numbness, and/or weakness of one or several areas of the body; allergic reactions; (i.e.: anaphylactic  reaction); and/or death. Furthermore, the patient was informed of those risks  and complications associated with the medications. These include, but are not limited to: allergic reactions (i.e.: anaphylactic or anaphylactoid reaction(s)); adrenal axis suppression; blood sugar elevation that in diabetics may result in ketoacidosis or comma; water retention that in patients with history of congestive heart failure may result in shortness of breath, pulmonary edema, and decompensation with resultant heart failure; weight gain; swelling or edema; medication-induced neural toxicity; particulate matter embolism and blood vessel occlusion with resultant organ, and/or nervous system infarction; and/or aseptic necrosis of one or more joints. Finally, the patient was informed that Medicine is not an exact science; therefore, there is also the possibility of unforeseen or unpredictable risks and/or possible complications that may result in a catastrophic outcome. The patient indicated having understood very clearly. We have given the patient no guarantees and we have made no promises. Enough time was given to the patient to ask questions, all of which were answered to the patient's satisfaction. Derek [name] has indicated that he wanted to continue with the procedure. Attestation: I, the ordering provider, attest that I have discussed with the patient the benefits, risks, side-effects, alternatives, likelihood of achieving goals, and potential problems during recovery for the procedure that I have provided informed consent. Date  Time: 07/26/2018 12:55 PM  Pre-Procedure Preparation:  Monitoring: As per clinic protocol. Respiration, ETCO2, SpO2, BP, heart rate and rhythm monitor placed and checked for adequate function Safety Precautions: Patient was assessed for positional comfort and pressure points before starting the procedure. Time-out: I initiated and conducted the "Time-out" before starting the procedure, as per  protocol. The patient was asked to participate by confirming the accuracy of the "Time Out" information. Verification of the correct person, site, and procedure were performed and confirmed by me, the nursing staff, and the patient. "Time-out" conducted as per Joint Commission's Universal Protocol (UP.01.01.01). Time: 1338  Description of Procedure:          Target Area: Superior, posterior, aspect of the sacroiliac fissure Approach: Posterior, paraspinal, ipsilateral approach. Area Prepped: Entire Lower Lumbosacral Region Prepping solution: ChloraPrep (2% chlorhexidine gluconate and 70% isopropyl alcohol) Safety Precautions: Aspiration looking for blood return was conducted prior to all injections. At no point did we inject any substances, as a needle was being advanced. No attempts were made at seeking any paresthesias. Safe injection practices and needle disposal techniques used. Medications properly checked for expiration dates. SDV (single dose vial) medications used. Description of the Procedure: Protocol guidelines were followed. The patient was placed in position over the procedure table. The target area was identified and the area prepped in the usual manner. Skin & deeper tissues infiltrated with local anesthetic. Appropriate amount of time allowed to pass for local anesthetics to take effect. The procedure needle was advanced under fluoroscopic guidance into the sacroiliac joint until a firm endpoint was obtained. Proper needle placement secured. Negative aspiration confirmed. Solution injected in intermittent fashion, asking for systemic symptoms every 0.5cc of injectate. The needles were then removed and the area cleansed, making sure to leave some of the prepping solution back to take advantage of its long term bactericidal properties. Vitals:   07/26/18 1345 07/26/18 1355 07/26/18 1405 07/26/18 1415  BP: 124/70 111/78 119/79 120/80  Pulse:      Resp: 18 14 14 16   Temp:  (!) 97.4 F  (36.3 C)  97.6 F (36.4 C)  SpO2: 91% 95% 97% 98%  Weight:      Height:        Start Time: 1338 hrs.  End Time: 1344 hrs. Materials:  Needle(s) Type: Spinal Needle Gauge: 22G Length: 3.5-in Medication(s): Please see orders for medications and dosing details.  Imaging Guidance (Non-Spinal):          Type of Imaging Technique: Fluoroscopy Guidance (Non-Spinal) Indication(s): Assistance in needle guidance and placement for procedures requiring needle placement in or near specific anatomical locations not easily accessible without such assistance. Exposure Time: Please see nurses notes. Contrast: Before injecting any contrast, we confirmed that the patient did not have an allergy to iodine, shellfish, or radiological contrast. Once satisfactory needle placement was completed at the desired level, radiological contrast was injected. Contrast injected under live fluoroscopy. No contrast complications. See chart for type and volume of contrast used. Fluoroscopic Guidance: I was personally present during the use of fluoroscopy. "Tunnel Vision Technique" used to obtain the best possible view of the target area. Parallax error corrected before commencing the procedure. "Direction-depth-direction" technique used to introduce the needle under continuous pulsed fluoroscopy. Once target was reached, antero-posterior, oblique, and lateral fluoroscopic projection used confirm needle placement in all planes. Images permanently stored in EMR. Interpretation: I personally interpreted the imaging intraoperatively. Adequate needle placement confirmed in multiple planes. Appropriate spread of contrast into desired area was observed. No evidence of afferent or efferent intravascular uptake. Permanent images saved into the patient's record.  Antibiotic Prophylaxis:   Anti-infectives (From admission, onward)   None     Indication(s): None identified  Post-operative Assessment:  Post-procedure Vital Signs:   Pulse/HCG Rate: 8494 Temp: 97.6 F (36.4 C) Resp: 16 BP: 120/80 SpO2: 98 %  EBL: None  Complications: No immediate post-treatment complications observed by team, or reported by patient.  Note: The patient tolerated the entire procedure well. A repeat set of vitals were taken after the procedure and the patient was kept under observation following institutional policy, for this type of procedure. Post-procedural neurological assessment was performed, showing return to baseline, prior to discharge. The patient was provided with post-procedure discharge instructions, including a section on how to identify potential problems. Should any problems arise concerning this procedure, the patient was given instructions to immediately contact us, at any time, without hesitation. In any case, we plan to contact the patient by telephone for a follow-up status report regarding this interventional procedure.  Comments:  No additional relevant information.  Plan of Care  Interventional management options: Planned, scheduled, and/or pending:   None at this time.   Considering:   Diagnostic bilateral lumbar facet block Possible bilateral lumbar facet RFA Diagnostic bilateral sacroiliac joint block Possible bilateral sacroiliac joint RFA Possible spinal cord stimulator trial   Palliative PRN treatment(s):   Therapeutic Racz procedure #2(no sooner than 6 months from 04/07/18)   Imaging Orders     DG C-Arm 1-60 Min-No Report  Procedure Orders     SACROILIAC JOINT INJECTION  Medications ordered for procedure: Meds ordered this encounter  Medications  . lidocaine (XYLOCAINE) 2 % (with pres) injection 400 mg  . midazolam (VERSED) 5 MG/5ML injection 1-2 mg    Make sure Flumazenil is available in the pyxis when using this medication. If oversedation occurs, administer 0.2 mg IV over 15 sec. If after 45 sec no response, administer 0.2 mg again over 1 min; may repeat at 1 min intervals; not to  exceed 4 doses (1 mg)  . fentaNYL (SUBLIMAZE) injection 25-50 mcg    Make sure Narcan is available in the pyxis when using this medication. In the event of respiratory depression (RR< 8/min): Titrate  NARCAN (naloxone) in increments of 0.1 to 0.2 mg IV at 2-3 minute intervals, until desired degree of reversal.  . lactated ringers infusion 1,000 mL  . ropivacaine (PF) 2 mg/mL (0.2%) (NAROPIN) injection 9 mL  . methylPREDNISolone acetate (DEPO-MEDROL) injection 80 mg   Medications administered: We administered lidocaine, fentaNYL, lactated ringers, ropivacaine (PF) 2 mg/mL (0.2%), and methylPREDNISolone acetate.  See the medical record for exact dosing, route, and time of administration.  Disposition: Discharge home  Discharge Date & Time: 07/26/2018; 1415 hrs.   Physician-requested Follow-up: Return for post-procedure eval (2 wks), w/ Dr. Laban EmperorNaveira.  Future Appointments  Date Time Provider Department Center  08/08/2018  1:30 PM Delano MetzNaveira, Caylen Yardley, MD [facility] None  08/22/2018  8:00 AM Barbette MerinoKing, Crystal M, NP [facility] None   Primary Care Physician: Sherron Mondayejan-Sie, S Ahmed, MD [facility] Note by: Oswaldo DoneFrancisco A Lucy Woolever, MD Date: 07/26/2018; Time: 2:26 PM  Disclaimer:  Medicine is not an Visual merchandiserexact science. The only guarantee in medicine is that nothing is guaranteed. It is important to note that the decision to proceed with this intervention was based on the information collected from the patient. The Data and conclusions were drawn from the patient's questionnaire, the interview, and the physical examination. Because the information was provided in large part by the patient, it cannot be guaranteed that it has not been purposely or unconsciously manipulated. Every effort has been made to obtain as much relevant data as possible for this evaluation. It is important to note that the conclusions that lead to this procedure are derived in large part from the available data.  Always take into account that the treatment will also be dependent on availability of resources and existing treatment guidelines, considered by other Pain Management Practitioners as being common knowledge and practice, at the time of the intervention. For Medico-Legal purposes, it is also important to point out that variation in procedural techniques and pharmacological choices are the acceptable norm. The indications, contraindications, technique, and results of the above procedure should only be interpreted and judged by a Board-Certified Interventional Pain Specialist with extensive familiarity and expertise in the same exact procedure and technique.

## 2018-07-26 NOTE — Patient Instructions (Signed)

## 2018-07-27 ENCOUNTER — Telehealth: Payer: Self-pay

## 2018-07-27 NOTE — Telephone Encounter (Signed)
Post procedure phone call .   Patient states he is doing ok, but was ho[ping it would work quicker.

## 2018-08-04 NOTE — Progress Notes (Deleted)
Patient's Name: Derek [name]  MRN: [id number]  Referring Provider: Jodi Marble, MD  DOB: 07-03-1981  PCP: Jodi Marble, MD  DOS: 08/08/2018  Note by: Gaspar Cola, MD  Service setting: Ambulatory outpatient  Specialty: Interventional Pain Management  Location: [facility] (AMB) Pain Management Facility    Patient type: Established   Primary Reason(s) for Visit: Encounter for post-procedure evaluation of chronic illness with mild to moderate exacerbation CC: No chief complaint on file.  HPI  Derek [name] is a 38 y.o. year old, male patient, who comes today for a post-procedure evaluation. He has Lumbar pseudoarthrosis (L5-S1); Tobacco use; Chronic low back pain (Primary Area of Pain) (Bilateral) (L>R) w/ sciatica (Bilateral); Chronic lower extremity pain (Secondary Area of Pain) (Bilateral) (L>R); Chronic pain syndrome; Long term current use of opiate analgesic; Pharmacologic therapy; Disorder of skeletal system; Problems influencing health status; Elevated C-reactive protein (CRP); Vitamin D insufficiency; Failed back surgical syndrome (x3); L5-S1 pseudoarthrosis; Chronic musculoskeletal pain; Spasm of back muscles; Sacroiliac joint dysfunction (Bilateral); Chronic sacroiliac joint pain (Bilateral); Somatic dysfunction of sacroiliac joints (Bilateral); Chronic hip pain (Bilateral); Epidural fibrosis; Lumbar postlaminectomy syndrome; DDD (degenerative disc disease), lumbosacral; Lumbar facet syndrome (Bilateral); Other specified dorsopathies, sacral and sacrococcygeal region; Spondylosis without myelopathy or radiculopathy, lumbosacral region; and Neurogenic pain on their problem list. His primarily concern today is the No chief complaint on file.  Pain Assessment: Location:     Radiating:   Onset:   Duration:   Quality:   Severity:  /10 (subjective, self-reported pain score)  Note: Reported level is compatible with observation.                         When using our objective Pain Scale,  levels between 6 and 10/10 are said to belong in an emergency room, as it progressively worsens from a 6/10, described as severely limiting, requiring emergency care not usually available at an outpatient pain management facility. At a 6/10 level, communication becomes difficult and requires great effort. Assistance to reach the emergency department may be required. Facial flushing and profuse sweating along with potentially dangerous increases in heart rate and blood pressure will be evident. Effect on ADL:   Timing:   Modifying factors:   BP:    HR:    Derek [name] comes in today for post-procedure evaluation.  Further details on both, my assessment(s), as well as the proposed treatment plan, please see below.  Post-Procedure Assessment  07/26/2018 Procedure: *** Pre-procedure pain score:        /10 Post-procedure pain score: 0/10         Influential Factors: BMI:   Intra-procedural challenges: None observed.         Assessment challenges: None detected.              Reported side-effects: None.        Post-procedural adverse reactions or complications: None reported         Sedation: Please see nurses note. When no sedatives are used, the analgesic levels obtained are directly associated to the effectiveness of the local anesthetics. However, when sedation is provided, the level of analgesia obtained during the initial 1 hour following the intervention, is believed to be the result of a combination of factors. These factors may include, but are not limited to: 1. The effectiveness of the local anesthetics used. 2. The effects of the analgesic(s) and/or anxiolytic(s) used. 3. The degree of discomfort experienced by the patient  at the time of the procedure. 4. The patients ability and reliability in recalling and recording the events. 5. The presence and influence of possible secondary gains and/or psychosocial factors. Reported result: Relief experienced during the 1st hour after the  procedure:   (Ultra-Short Term Relief)            Interpretative annotation: Clinically appropriate result. Analgesia during this period is likely to be Local Anesthetic and/or IV Sedative (Analgesic/Anxiolytic) related.          Effects of local anesthetic: The analgesic effects attained during this period are directly associated to the localized infiltration of local anesthetics and therefore cary significant diagnostic value as to the etiological location, or anatomical origin, of the pain. Expected duration of relief is directly dependent on the pharmacodynamics of the local anesthetic used. Long-acting (4-6 hours) anesthetics used.  Reported result: Relief during the next 4 to 6 hour after the procedure:   (Short-Term Relief)            Interpretative annotation: Clinically appropriate result. Analgesia during this period is likely to be Local Anesthetic-related.          Long-term benefit: Defined as the period of time past the expected duration of local anesthetics (1 hour for short-acting and 4-6 hours for long-acting). With the possible exception of prolonged sympathetic blockade from the local anesthetics, benefits during this period are typically attributed to, or associated with, other factors such as analgesic sensory neuropraxia, antiinflammatory effects, or beneficial biochemical changes provided by agents other than the local anesthetics.  Reported result: Extended relief following procedure:   (Long-Term Relief)            Interpretative annotation: Clinically possible results. Good relief. No permanent benefit expected. Inflammation plays a part in the etiology to the pain.          Current benefits: Defined as reported results that persistent at this point in time.   Analgesia: *** %            Function: Somewhat improved ROM: Somewhat improved Interpretative annotation: Recurrence of symptoms. No permanent benefit expected. Effective diagnostic intervention.           Interpretation: Results would suggest a successful diagnostic intervention.                  Plan:  Please see "Plan of Care" for details.                Laboratory Chemistry  Inflammation Markers (CRP: Acute Phase) (ESR: Chronic Phase) Lab Results  Component Value Date   CRP 13 (H) 01/24/2018   ESRSEDRATE 8 01/24/2018                         Rheumatology Markers No results found for: RF, ANA, LABURIC, URICUR, LYMEIGGIGMAB, LYMEABIGMQN, HLAB27                      Renal Markers Lab Results  Component Value Date   BUN 8 01/24/2018   CREATININE 1.02 01/24/2018   BCR 8 (L) 01/24/2018   GFRAA 107 01/24/2018   GFRNONAA 93 01/24/2018                             Hepatic Markers Lab Results  Component Value Date   AST 22 01/24/2018   ALBUMIN 4.4 01/24/2018  Neuropathy Markers Lab Results  Component Value Date   VITAMINB12 462 01/24/2018                        Hematology Parameters Lab Results  Component Value Date   PLT 248 02/21/2015   HGB 13.1 02/21/2015   HCT 39.9 02/21/2015                        CV Markers No results found for: BNP, CKTOTAL, CKMB, TROPONINI                       Note: Lab results reviewed.  Recent Imaging Results   Results for orders placed in visit on 07/26/18  DG C-Arm 1-60 Min-No Report   Narrative Fluoroscopy was utilized by the requesting physician.  No radiographic  interpretation.    Interpretation Report: Fluoroscopy was used during the procedure to assist with needle guidance. The images were interpreted intraoperatively by the requesting physician.  Meds   Current Outpatient Medications:  .  Calcium Carbonate-Vit D-Min (GNP CALCIUM 1200) 1200-1000 MG-UNIT CHEW, Chew 1,200 mg by mouth daily with breakfast. Take in combination with vitamin D and magnesium., Disp: 30 tablet, Rfl: 5 .  Cholecalciferol (VITAMIN D3) 125 MCG (5000 UT) CAPS, Take 1 capsule (5,000 Units total) by mouth daily with breakfast. Take  along with calcium and magnesium., Disp: 30 capsule, Rfl: 5 .  cyclobenzaprine (FLEXERIL) 10 MG tablet, Take 1 tablet (10 mg total) by mouth at bedtime., Disp: 30 tablet, Rfl: 2 .  Magnesium 500 MG CAPS, Take 1 capsule (500 mg total) by mouth 2 (two) times daily at 8 am and 10 pm., Disp: 60 capsule, Rfl: 5 .  Oxycodone HCl 10 MG TABS, Take 1 tablet (10 mg total) by mouth every 6 (six) hours as needed for up to 30 days (Take only if needed, not around-the-clock.)., Disp: 120 tablet, Rfl: 0 .  tiZANidine (ZANAFLEX) 4 MG tablet, Take 1 tablet (4 mg total) by mouth 2 (two) times daily as needed for muscle spasms (For muscle pain/spasm during the day.)., Disp: 60 tablet, Rfl: 2  ROS  Constitutional: Denies any fever or chills Gastrointestinal: No reported hemesis, hematochezia, vomiting, or acute GI distress Musculoskeletal: Denies any acute onset joint swelling, redness, loss of ROM, or weakness Neurological: No reported episodes of acute onset apraxia, aphasia, dysarthria, agnosia, amnesia, paralysis, loss of coordination, or loss of consciousness  Allergies  Derek [name] is allergic to amoxicillin and vicodin [hydrocodone-acetaminophen].  [place]  Drug: Derek [name]  reports no history of drug use. Alcohol:  reports current alcohol use. Tobacco:  reports that he has been smoking. He has a 16.00 pack-year smoking history. He has never used smokeless tobacco. Medical:  has a past medical history of Anxiety, Arthritis, and GERD (gastroesophageal reflux disease). Surgical: Derek [name]  has a past surgical history that includes Back surgery; HAND REIMPLANTED; and Lumbar fusion (11/14). Family: family history is not on file.  Constitutional Exam  General appearance: Well nourished, well developed, and well hydrated. In no apparent acute distress There were no vitals filed for this visit. BMI Assessment: Estimated body mass index is 27.41 kg/m as calculated from the following:   Height as of 07/26/18: '5\' 10"'   (1.778 m).   Weight as of 07/26/18: 191 lb (86.6 kg).  BMI interpretation table: BMI level Category Range association with higher incidence of chronic pain  <18 kg/m2 Underweight  18.5-24.9 kg/m2 Ideal body weight   25-29.9 kg/m2 Overweight Increased incidence by 20%  30-34.9 kg/m2 Obese (Class I) Increased incidence by 68%  35-39.9 kg/m2 Severe obesity (Class II) Increased incidence by 136%  >40 kg/m2 Extreme obesity (Class III) Increased incidence by 254%   Patient's current BMI Ideal Body weight  There is no height or weight on file to calculate BMI. Ideal body weight: 73 kg (160 lb 15 oz) Adjusted ideal body weight: 78.5 kg (172 lb 15.4 oz)   BMI Readings from Last 4 Encounters:  07/26/18 27.41 kg/m  07/21/18 25.97 kg/m  06/20/18 25.97 kg/m  05/25/18 25.83 kg/m   Wt Readings from Last 4 Encounters:  07/26/18 191 lb (86.6 kg)  07/21/18 181 lb (82.1 kg)  06/20/18 181 lb (82.1 kg)  05/25/18 180 lb (81.6 kg)  Psych/Mental status: Alert, oriented x 3 (person, place, & time)       Eyes: PERLA Respiratory: No evidence of acute respiratory distress  Cervical Spine Area Exam  Skin & Axial Inspection: No masses, redness, edema, swelling, or associated skin lesions Alignment: Symmetrical Functional ROM: Unrestricted ROM      Stability: No instability detected Muscle Tone/Strength: Functionally intact. No obvious neuro-muscular anomalies detected. Sensory (Neurological): Unimpaired Palpation: No palpable anomalies              Upper Extremity (UE) Exam    Side: Right upper extremity  Side: Left upper extremity  Skin & Extremity Inspection: Skin color, temperature, and hair growth are WNL. No peripheral edema or cyanosis. No masses, redness, swelling, asymmetry, or associated skin lesions. No contractures.  Skin & Extremity Inspection: Skin color, temperature, and hair growth are WNL. No peripheral edema or cyanosis. No masses, redness, swelling, asymmetry, or associated skin  lesions. No contractures.  Functional ROM: Unrestricted ROM          Functional ROM: Unrestricted ROM          Muscle Tone/Strength: Functionally intact. No obvious neuro-muscular anomalies detected.  Muscle Tone/Strength: Functionally intact. No obvious neuro-muscular anomalies detected.  Sensory (Neurological): Unimpaired          Sensory (Neurological): Unimpaired          Palpation: No palpable anomalies              Palpation: No palpable anomalies              Provocative Test(s):  Phalen's test: deferred Tinel's test: deferred Apley's scratch test (touch opposite shoulder):  Action 1 (Across chest): deferred Action 2 (Overhead): deferred Action 3 (LB reach): deferred   Provocative Test(s):  Phalen's test: deferred Tinel's test: deferred Apley's scratch test (touch opposite shoulder):  Action 1 (Across chest): deferred Action 2 (Overhead): deferred Action 3 (LB reach): deferred    Thoracic Spine Area Exam  Skin & Axial Inspection: No masses, redness, or swelling Alignment: Symmetrical Functional ROM: Unrestricted ROM Stability: No instability detected Muscle Tone/Strength: Functionally intact. No obvious neuro-muscular anomalies detected. Sensory (Neurological): Unimpaired Muscle strength & Tone: No palpable anomalies  Lumbar Spine Area Exam  Skin & Axial Inspection: No masses, redness, or swelling Alignment: Symmetrical Functional ROM: Unrestricted ROM       Stability: No instability detected Muscle Tone/Strength: Functionally intact. No obvious neuro-muscular anomalies detected. Sensory (Neurological): Unimpaired Palpation: No palpable anomalies       Provocative Tests: Hyperextension/rotation test: deferred today       Lumbar quadrant test (Kemp's test): deferred today       Lateral bending test: deferred  today       Patrick's Maneuver: deferred today                   FABER* test: deferred today                   S-I anterior distraction/compression test:  deferred today         S-I lateral compression test: deferred today         S-I Thigh-thrust test: deferred today         S-I Gaenslen's test: deferred today         *(Flexion, ABduction and External Rotation)  Gait & Posture Assessment  Ambulation: Unassisted Gait: Relatively normal for age and body habitus Posture: WNL   Lower Extremity Exam    Side: Right lower extremity  Side: Left lower extremity  Stability: No instability observed          Stability: No instability observed          Skin & Extremity Inspection: Skin color, temperature, and hair growth are WNL. No peripheral edema or cyanosis. No masses, redness, swelling, asymmetry, or associated skin lesions. No contractures.  Skin & Extremity Inspection: Skin color, temperature, and hair growth are WNL. No peripheral edema or cyanosis. No masses, redness, swelling, asymmetry, or associated skin lesions. No contractures.  Functional ROM: Unrestricted ROM                  Functional ROM: Unrestricted ROM                  Muscle Tone/Strength: Functionally intact. No obvious neuro-muscular anomalies detected.  Muscle Tone/Strength: Functionally intact. No obvious neuro-muscular anomalies detected.  Sensory (Neurological): Unimpaired        Sensory (Neurological): Unimpaired        DTR: Patellar: deferred today Achilles: deferred today Plantar: deferred today  DTR: Patellar: deferred today Achilles: deferred today Plantar: deferred today  Palpation: No palpable anomalies  Palpation: No palpable anomalies   Assessment  Primary Diagnosis & Pertinent Problem List: There were no encounter diagnoses.  Status Diagnosis  Controlled Controlled Controlled No diagnosis found.  Problems updated and reviewed during this visit: No problems updated. Plan of Care  Pharmacotherapy (Medications Ordered): No orders of the defined types were placed in this encounter.  Medications administered today: Derek [name] had no medications  administered during this visit.  Procedure Orders    No procedure(s) ordered today   Lab Orders  No laboratory test(s) ordered today   Imaging Orders  No imaging studies ordered today   Referral Orders  No referral(s) requested today   Interventional management options: Planned, scheduled, and/or pending:   ***   Considering:   ***   Palliative PRN treatment(s):   None at this time   Provider-requested follow-up: No follow-ups on file.  Future Appointments  Date Time Provider [place]  08/08/2018  1:30 PM Milinda Pointer, MD [facility] None  08/22/2018  8:00 AM Vevelyn Francois, NP [facility] None   Primary Care Physician: Jodi Marble, MD Location: [facility] Note by: Gaspar Cola, MD Date: 08/08/2018; Time: 8:19 AM

## 2018-08-08 ENCOUNTER — Ambulatory Visit: Payer: BLUE CROSS/BLUE SHIELD | Admitting: Pain Medicine

## 2018-08-22 ENCOUNTER — Other Ambulatory Visit: Payer: Self-pay

## 2018-08-22 ENCOUNTER — Ambulatory Visit: Payer: BLUE CROSS/BLUE SHIELD | Attending: Nurse Practitioner | Admitting: Nurse Practitioner

## 2018-08-22 ENCOUNTER — Encounter: Payer: Self-pay | Admitting: Nurse Practitioner

## 2018-08-22 VITALS — BP 114/90 | HR 112 | Temp 98.1°F | Ht 70.0 in | Wt 191.0 lb

## 2018-08-22 DIAGNOSIS — G894 Chronic pain syndrome: Secondary | ICD-10-CM | POA: Diagnosis not present

## 2018-08-22 DIAGNOSIS — M533 Sacrococcygeal disorders, not elsewhere classified: Secondary | ICD-10-CM | POA: Insufficient documentation

## 2018-08-22 DIAGNOSIS — S32009K Unspecified fracture of unspecified lumbar vertebra, subsequent encounter for fracture with nonunion: Secondary | ICD-10-CM | POA: Insufficient documentation

## 2018-08-22 DIAGNOSIS — M47817 Spondylosis without myelopathy or radiculopathy, lumbosacral region: Secondary | ICD-10-CM | POA: Insufficient documentation

## 2018-08-22 MED ORDER — OXYCODONE HCL 10 MG PO TABS: 10.0000 mg | ORAL_TABLET | Freq: Four times a day (QID) | ORAL | 0 refills | Status: DC | PRN

## 2018-08-22 NOTE — Progress Notes (Signed)
Patient's Name: Derek [name]  MRN: [id number]  Referring Provider: Jodi Marble, MD  DOB: 08-Oct-1980  PCP: Jodi Marble, MD  DOS: 08/22/2018  Note by: Dionisio David, NP  Service setting: Ambulatory outpatient  Specialty: Interventional Pain Management  Location: [facility] (AMB) Pain Management Facility    Patient type: Established   HPI  Reason for Visit: Derek [name] is a 38 y.o. year old, male patient, who comes today with a chief complaint of Back Pain  Pain Assessment: Today, Derek [name] describes the severity of the Chronic pain as a 3 /10. He indicates the location/referral of the pain to be Back Lower, Left, Right/pain radiaties down both leg. Onset was: More than a month ago. The quality of pain is described as Aching, Burning, Throbbing, Constant. Temporal description, or timing of pain is: Constant. Possible modifying factors: medications, heat and ice, procedures. Derek [name]  height is '5\' 10"'  (1.778 m) and weight is 191 lb (86.6 kg). His temperature is 98.1 F (36.7 C). His blood pressure is 114/90 and his pulse is 112 (abnormal). His oxygen saturation is 99%.   He is having increase pain in his left buttock. He feels like when he coughs or sneezes it makes the pain worse. The pain and goes down the back of his left leg into he knee.   Controlled Substance Pharmacotherapy Assessment REMS (Risk Evaluation and Mitigation Strategy)  Analgesic:OxycodoneIR10 mg oxycodone q6hrs (40 mg/day) MME/day:67m/day BChauncey Fischer RN  08/22/2018  8:17 AM  Sign when Signing Visit Nursing Pain Medication Assessment:  Safety precautions to be maintained throughout the outpatient stay will include: orient to surroundings, keep bed in low position, maintain call bell within reach at all times, provide assistance with transfer out of bed and ambulation.  Medication Inspection Compliance: Pill count conducted under aseptic conditions, in front of the patient. Neither the pills nor the  bottle was removed from the patient's sight at any time. Once count was completed pills were immediately returned to the patient in their original bottle.  Medication: Oxycodone IR Pill/Patch Count: 15 of 120 pills remain Pill/Patch Appearance: Markings consistent with prescribed medication Bottle Appearance: Standard pharmacy container. Clearly labeled. Filled Date: 1 / 21 / 2020 Last Medication intake:  Yesterday   Pharmacokinetics: Liberation and absorption (onset of action): WNL Distribution (time to peak effect): WNL Metabolism and excretion (duration of action): WNL         Pharmacodynamics: Desired effects: Analgesia: Derek [name] >50% benefit. Functional ability: Patient reports that medication allows him to accomplish basic ADLs Clinically meaningful improvement in function (CMIF): Sustained CMIF goals met Perceived effectiveness: Described as relatively effective, allowing for increase in activities of daily living (ADL) Undesirable effects: Side-effects or Adverse reactions: None reported Monitoring: [place] PMP: Online review of the past 165-montheriod conducted. Compliant with practice rules and regulations Last UDS on record: Summary  Date Value Ref Range Status  04/21/2018 FINAL  Final    Comment:    ==================================================================== TOXASSURE SELECT 13 (MW) ==================================================================== Test                             Result       Flag       Units Drug Present and Declared for Prescription Verification   Oxycodone                      1156  EXPECTED   ng/mg creat   Oxymorphone                    3549         EXPECTED   ng/mg creat   Noroxycodone                   1392         EXPECTED   ng/mg creat   Noroxymorphone                 352          EXPECTED   ng/mg creat    Sources of oxycodone are scheduled prescription medications.    Oxymorphone, noroxycodone, and noroxymorphone are  expected    metabolites of oxycodone. Oxymorphone is also available as a    scheduled prescription medication. Drug Absent but Declared for Prescription Verification   Hydrocodone                    Not Detected UNEXPECTED ng/mg creat ==================================================================== Test                      Result    Flag   Units      Ref Range   Creatinine              154              mg/dL      >=20 ==================================================================== Declared Medications:  The flagging and interpretation on this report are based on the  following declared medications.  Unexpected results may arise from  inaccuracies in the declared medications.  **Note: The testing scope of this panel includes these medications:  Hydrocodone (Vicodin)  Oxycodone (Percocet)  **Note: The testing scope of this panel does not include following  reported medications:  Acetaminophen (Percocet)  Acetaminophen (Vicodin)  Calcium carbonate (Calcium Carb/Vitamin D)  Cyclobenzaprine (Flexeril)  Magnesium (Mag-Ox)  Tizanidine  Vitamin D (Calcium Carb/Vitamin D)  Vitamin D3 ==================================================================== For clinical consultation, please call [phone]. ====================================================================    UDS interpretation: Compliant          Medication Assessment Form: Reviewed. Patient indicates being compliant with therapy Treatment compliance: Compliant Risk Assessment Profile: Aberrant behavior: See initial evaluations. None observed or detected today Comorbid factors increasing risk of overdose: See initial evaluation. No additional risks detected today Opioid risk tool (ORT):  Opioid Risk  08/22/2018  Alcohol 0  Illegal Drugs 0  Rx Drugs 0  Alcohol 0  Illegal Drugs 0  Rx Drugs 0  Age between 16-45 years  1  History of Preadolescent Sexual Abuse 0  Psychological Disease 0  Depression 0   Opioid Risk Tool Scoring 1  Opioid Risk Interpretation Low Risk    ORT Scoring interpretation table:  Score <3 = Low Risk for SUD  Score between 4-7 = Moderate Risk for SUD  Score >8 = High Risk for Opioid Abuse   Risk of substance use disorder (SUD): Low-to-Moderate  Risk Mitigation Strategies:  Patient Counseling: Covered Patient-Prescriber Agreement (PPA): Present and active  Notification to other healthcare providers: Done  Pharmacologic Plan: No change in therapy, at this time.             ROS  Constitutional: Denies any fever or chills Gastrointestinal: No reported hemesis, hematochezia, vomiting, or acute GI distress Musculoskeletal: Denies any acute onset joint swelling, redness, loss of ROM, or weakness Neurological: No reported episodes of acute  onset apraxia, aphasia, dysarthria, agnosia, amnesia, paralysis, loss of coordination, or loss of consciousness  Medication Review  GNP CALCIUM 1200, Magnesium, Oxycodone HCl, Vitamin D3, cyclobenzaprine, and tiZANidine  History Review  Allergy: Derek [name] is allergic to amoxicillin and vicodin [hydrocodone-acetaminophen]. Drug: Derek [name]  reports no history of drug use. Alcohol:  reports current alcohol use. Tobacco:  reports that he has been smoking. He has a 16.00 pack-year smoking history. He has never used smokeless tobacco. Social: Derek [name]  reports that he has been smoking. He has a 16.00 pack-year smoking history. He has never used smokeless tobacco. He reports current alcohol use. He reports that he does not use drugs. Medical:  has a past medical history of Anxiety, Arthritis, and GERD (gastroesophageal reflux disease). Surgical: Derek [name]  has a past surgical history that includes Back surgery; HAND REIMPLANTED; and Lumbar fusion (11/14). Family: family history is not on file. 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  Lab Review  Kidney Function Lab Results  Component Value Date   BUN 8 01/24/2018   CREATININE 1.02 01/24/2018   BCR 8 (L) 01/24/2018   GFRAA 107 01/24/2018   GFRNONAA 93 01/24/2018  Liver Function Lab Results  Component Value Date   AST 22 01/24/2018   ALBUMIN 4.4 01/24/2018  Note: Above Lab results reviewed.  Imaging Review  DG C-Arm 1-60 Min-No Report Fluoroscopy was utilized by the requesting physician.  No radiographic  interpretation.  Note: Above imaging results reviewed.        Physical Exam  General appearance: Well nourished, well developed, and well hydrated. In no apparent acute distress Mental status: Alert, oriented x 3 (person, place, & time)       Respiratory: No evidence of acute respiratory distress Eyes: PERLA Vitals: BP 114/90   Pulse (!) 112   Temp 98.1 F (36.7 C)   Ht '5\' 10"'  (1.778 m)   Wt 191 lb (86.6 kg)   SpO2 99%   BMI 27.41 kg/m  BMI: Estimated body mass index is 27.41 kg/m as calculated from the following:   Height as of this encounter: '5\' 10"'  (1.778 m).   Weight as of this encounter: 191 lb (86.6 kg). Ideal: Ideal body weight: 73 kg (160 lb 15 oz) Adjusted ideal body weight: 78.5 kg (172 lb 15.4 oz) Lumbar Spine Area Exam  Skin & Axial Inspection: No masses, redness, or swelling Alignment: Symmetrical Functional ROM: Unrestricted ROM       Stability: No instability detected Muscle Tone/Strength: Functionally intact. No obvious neuro-muscular anomalies detected. Sensory (Neurological): Unimpaired Palpation: Tender       Provocative Tests: Hyperextension/rotation test: deferred today       Lumbar quadrant test (Kemp's test): deferred today       Lateral bending test: deferred today       Patrick's Maneuver: (+) for left-sided S-I arthralgia             Lower Extremity Exam    Side: Right lower extremity  Side: Left lower  extremity  Stability: No instability observed          Stability: No instability observed          Skin & Extremity Inspection: Skin color, temperature, and hair growth are WNL. No peripheral edema or cyanosis. No masses, redness, swelling, asymmetry, or associated  skin lesions. No contractures.  Skin & Extremity Inspection: Skin color, temperature, and hair growth are WNL. No peripheral edema or cyanosis. No masses, redness, swelling, asymmetry, or associated skin lesions. No contractures.  Functional ROM: Unrestricted ROM                  Functional ROM: Unrestricted ROM                  Muscle Tone/Strength: Functionally intact. No obvious neuro-muscular anomalies detected.  Muscle Tone/Strength: Functionally intact. No obvious neuro-muscular anomalies detected.  Sensory (Neurological): Unimpaired        Sensory (Neurological): Referred pain pattern            Palpation: No palpable anomalies  Palpation: No palpable anomalies    Assessment   Status Diagnosis  Controlled Persistent Controlled 1. Lumbar pseudoarthrosis (L5-S1)   2. Sacroiliac joint dysfunction (Bilateral)   3. Spondylosis without myelopathy or radiculopathy, lumbosacral region   4. Chronic pain syndrome      Updated Problems: No problems updated.  Plan of Care  Medications: I am having [name] start on Oxycodone HCl. I am also having him maintain his cyclobenzaprine, tiZANidine, GNP CALCIUM 1200, Vitamin D3, Magnesium, and Oxycodone HCl.  Administered today: [name] had no medications administered during this visit.  Orders:  No orders of the defined types were placed in this encounter.  Interventional options: Planned follow-up:   Not at this time.  Plan: Return in about 2 months (around 10/21/2018) for MedMgmt, Appointment As Scheduled procedure follow-up.   Considering: Diagnostic bilateral lumbar facet block Possible bilateral lumbar facet RFA Diagnostic bilateral sacroiliac joint  block Possible bilateral sacroiliac joint RFA Possible spinal cord stimulator trial   Palliative PRN treatment(s): Therapeutic Racz procedure #2(no sooner than 6 months from 04/07/18     Note by: Dionisio David, NP Date: 08/22/2018; Time: 9:13 AM

## 2018-08-22 NOTE — Progress Notes (Signed)
Nursing Pain Medication Assessment:  Safety precautions to be maintained throughout the outpatient stay will include: orient to surroundings, keep bed in low position, maintain call bell within reach at all times, provide assistance with transfer out of bed and ambulation.  Medication Inspection Compliance: Pill count conducted under aseptic conditions, in front of the patient. Neither the pills nor the bottle was removed from the patient's sight at any time. Once count was completed pills were immediately returned to the patient in their original bottle.  Medication: Oxycodone IR Pill/Patch Count: 15 of 120 pills remain Pill/Patch Appearance: Markings consistent with prescribed medication Bottle Appearance: Standard pharmacy container. Clearly labeled. Filled Date: 1 / 21 / 2020 Last Medication intake:  Yesterday

## 2018-08-22 NOTE — Patient Instructions (Addendum)
____________________________________________________________________________________________  Medication Rules  Purpose: To inform patients, and their family members, of our rules and regulations.  Applies to: All patients receiving prescriptions (written or electronic).  Pharmacy of record: Pharmacy where electronic prescriptions will be sent. If written prescriptions are taken to a different pharmacy, please inform the nursing staff. The pharmacy listed in the electronic medical record should be the one where you would like electronic prescriptions to be sent.  Electronic prescriptions: In compliance with the [place] Strengthen Opioid Misuse Prevention (STOP) Act of 2017 (Session Law 2017-74/H243), effective July 06, 2018, all controlled substances must be electronically prescribed. Calling prescriptions to the pharmacy will cease to exist.  Prescription refills: Only during scheduled appointments. Applies to all prescriptions.  NOTE: The following applies primarily to controlled substances (Opioid* Pain Medications).   Patient's responsibilities: 1. Pain Pills: Bring all pain pills to every appointment (except for procedure appointments). 2. Pill Bottles: Bring pills in original pharmacy bottle. Always bring the newest bottle. Bring bottle, even if empty. 3. Medication refills: You are responsible for knowing and keeping track of what medications you take and those you need refilled. The day before your appointment: write a list of all prescriptions that need to be refilled. The day of the appointment: give the list to the admitting nurse. Prescriptions will be written only during appointments. No prescriptions will be written on procedure days. If you forget a medication: it will not be "Called in", "Faxed", or "electronically sent". You will need to get another appointment to get these prescribed. No early refills. Do not call asking to have your prescription filled  early. 4. Prescription Accuracy: You are responsible for carefully inspecting your prescriptions before leaving our office. Have the discharge nurse carefully go over each prescription with you, before taking them home. Make sure that your name is accurately spelled, that your address is correct. Check the name and dose of your medication to make sure it is accurate. Check the number of pills, and the written instructions to make sure they are clear and accurate. Make sure that you are given enough medication to last until your next medication refill appointment. 5. Taking Medication: Take medication as prescribed. When it comes to controlled substances, taking less pills or less frequently than prescribed is permitted and encouraged. Never take more pills than instructed. Never take medication more frequently than prescribed.  6. Inform other Doctors: Always inform, all of your healthcare providers, of all the medications you take. 7. Pain Medication from other Providers: You are not allowed to accept any additional pain medication from any other Doctor or Healthcare provider. There are two exceptions to this rule. (see below) In the event that you require additional pain medication, you are responsible for notifying us, as stated below. 8. Medication Agreement: You are responsible for carefully reading and following our Medication Agreement. This must be signed before receiving any prescriptions from our practice. Safely store a copy of your signed Agreement. Violations to the Agreement will result in no further prescriptions. (Additional copies of our Medication Agreement are available upon request.) 9. Laws, Rules, & Regulations: All patients are expected to follow all Federal and State Laws, Statutes, Rules, & Regulations. Ignorance of the Laws does not constitute a valid excuse. The use of any illegal substances is prohibited. 10. Adopted CDC guidelines & recommendations: Target dosing levels will be  at or below 60 MME/day. Use of benzodiazepines** is not recommended.  Exceptions: There are only two exceptions to the rule of not   receiving pain medications from other Healthcare Providers. 1. Exception #1 (Emergencies): In the event of an emergency (i.e.: accident requiring emergency care), you are allowed to receive additional pain medication. However, you are responsible for: As soon as you are able, call our office [phone], at any time of the day or night, and leave a message stating your name, the date and nature of the emergency, and the name and dose of the medication prescribed. In the event that your call is answered by a member of our staff, make sure to document and save the date, time, and the name of the person that took your information.  2. Exception #2 (Planned Surgery): In the event that you are scheduled by another doctor or dentist to have any type of surgery or procedure, you are allowed (for a period no longer than 30 days), to receive additional pain medication, for the acute post-op pain. However, in this case, you are responsible for picking up a copy of our "Post-op Pain Management for Surgeons" handout, and giving it to your surgeon or dentist. This document is available at our office, and does not require an appointment to obtain it. Simply go to our office during business hours (Monday-Thursday from 8:00 AM to 4:00 PM) (Friday 8:00 AM to 12:00 Noon) or if you have a scheduled appointment with us, prior to your surgery, and ask for it by name. In addition, you will need to provide us with your name, name of your surgeon, type of surgery, and date of procedure or surgery.  *Opioid medications include: morphine, codeine, oxycodone, oxymorphone, hydrocodone, hydromorphone, meperidine, tramadol, tapentadol, buprenorphine, fentanyl, methadone. **Benzodiazepine medications include: diazepam (Valium), alprazolam (Xanax), clonazepam (Klonopine), lorazepam (Ativan), clorazepate  (Tranxene), chlordiazepoxide (Librium), estazolam (Prosom), oxazepam (Serax), temazepam (Restoril), triazolam (Halcion) (Last updated: 09/02/2017) ____________________________________________________________________________________________    

## 2018-08-23 NOTE — Progress Notes (Signed)
Patient's Name: Derek [name]  MRN: [id number]  Referring Provider: Jodi Marble, MD  DOB: February 25, 1981  PCP: Jodi Marble, MD  DOS: 08/24/2018  Note by: Gaspar Cola, MD  Service setting: Ambulatory outpatient  Specialty: Interventional Pain Management  Location: [facility] (AMB) Pain Management Facility    Patient type: Established   Primary Reason(s) for Visit: Encounter for post-procedure evaluation of chronic illness with mild to moderate exacerbation CC: Back Pain  HPI  Mr. Marling is a 38 y.o. year old, male patient, who comes today for a post-procedure evaluation. He has Lumbar pseudoarthrosis (L5-S1); Tobacco use; Chronic low back pain (Primary Area of Pain) (Bilateral) (L>R) w/ sciatica (Bilateral); Chronic lower extremity pain (Secondary Area of Pain) (Bilateral) (L>R); Chronic pain syndrome; Long term current use of opiate analgesic; Pharmacologic therapy; Disorder of skeletal system; Problems influencing health status; Elevated C-reactive protein (CRP); Vitamin D insufficiency; Failed back surgical syndrome (x3); L5-S1 pseudoarthrosis; Chronic musculoskeletal pain; Spasm of back muscles; Sacroiliac joint dysfunction (Bilateral); Chronic sacroiliac joint pain (Bilateral); Somatic dysfunction of sacroiliac joints (Bilateral); Chronic hip pain (Bilateral); Epidural fibrosis; Lumbar postlaminectomy syndrome; DDD (degenerative disc disease), lumbosacral; Lumbar facet syndrome (Bilateral); Other specified dorsopathies, sacral and sacrococcygeal region; Spondylosis without myelopathy or radiculopathy, lumbosacral region; and Neurogenic pain on their problem list. His primarily concern today is the Back Pain  Pain Assessment: Location: Right, Left, Lower Back Radiating: pain radiaties down left leg Onset: More than a month ago Duration: Chronic pain Quality: Aching, Burning, Throbbing, Constant Severity: 3 /10 (subjective, self-reported pain score)  Note: Reported level is compatible  with observation.                         When using our objective Pain Scale, levels between 6 and 10/10 are said to belong in an emergency room, as it progressively worsens from a 6/10, described as severely limiting, requiring emergency care not usually available at an outpatient pain management facility. At a 6/10 level, communication becomes difficult and requires great effort. Assistance to reach the emergency department may be required. Facial flushing and profuse sweating along with potentially dangerous increases in heart rate and blood pressure will be evident. Effect on ADL: unable to sit up at times, limits my dailt activities Timing: Constant Modifying factors: medications, heat and ice, procedures BP: 113/85  HR: 96  Mr. Pavone comes in today for post-procedure evaluation.  According to him, he got good relief of the pain for the pain for the duration of the local anesthetic.  However, once the local anesthetic wore off so that the relief.  However upon further questioning him, it turns out that the pain on the right side went away and never came back.  At this point he is having pain only on the left side.  This would suggest to me that the pain on the right side was being sustained by an inflammatory process in the area of the SI joint.  However on the left side, this is not the case.  Of interest that the fact that he indicates that when he coughs, sneezes, or is straining to have a bowel movement, this triggers the pain.  The one thing that is common to all of those things is just that they all will trigger a Valsalva maneuver which in turn is triggering the sharp pain.  This is typical of problems with herniated disks, however he has had extensive lumbar surgery and despite the fact that I  would look to get an MRI of the lumbar spine, or at least a CT myelogram, he indicates that he does not have the money for this.  We had previously order 1 of these in when he was told of the cost, he  decided not to have it done.  However, in reviewing the pattern of his pain, it would seem that it follows an S2, S3 distribution on the left side.  The pain goes through the back of the leg but does not go much further after the popliteal fossa.  Around October last year I did a Racz procedure for him that provided him with significant relief of the pain.  Unfortunately, the duration of benefit from these type of procedures will range from 3 months to a year.  In this case, he has been close to 4 months.  At this point he is miserable and he would like to have this repeated.  Today we spent quite a bit of time explaining to him the mechanism of his pain and how there is the possibility that the acute pain that he was experiencing was not being generated intraspinally, but in the pelvic area once the sacral nerves have come out of the sacral canal.  I went ahead and showed him the results of the prior epidurogram and I also explained to him why said that despite the fact that the scar tissue his at the L4-5 level, he could be experiencing the symptoms lower than that.  Today we also talked about procedure and the risk associated with it.  He understood and accepted.  Further details on both, my assessment(s), as well as the proposed treatment plan, please see below.  Post-Procedure Assessment  07/26/2018 Procedure: Diagnostic bilateral sacroiliac joint block #1 under fluoroscopic guidance and IV sedation Pre-procedure pain score:  3/10 Post-procedure pain score: 0/10 (100% relief) Influential Factors: BMI: 25.83 kg/m Intra-procedural challenges: None observed.         Assessment challenges: None detected.              Reported side-effects: None.        Post-procedural adverse reactions or complications: None reported         Sedation: Sedation provided. When no sedatives are used, the analgesic levels obtained are directly associated to the effectiveness of the local anesthetics. However, when  sedation is provided, the level of analgesia obtained during the initial 1 hour following the intervention, is believed to be the result of a combination of factors. These factors may include, but are not limited to: 1. The effectiveness of the local anesthetics used. 2. The effects of the analgesic(s) and/or anxiolytic(s) used. 3. The degree of discomfort experienced by the patient at the time of the procedure. 4. The patients ability and reliability in recalling and recording the events. 5. The presence and influence of possible secondary gains and/or psychosocial factors. Reported result: Relief experienced during the 1st hour after the procedure: 100 % (Ultra-Short Term Relief)            Interpretative annotation: Clinically appropriate result. Analgesia during this period is likely to be Local Anesthetic and/or IV Sedative (Analgesic/Anxiolytic) related.          Effects of local anesthetic: The analgesic effects attained during this period are directly associated to the localized infiltration of local anesthetics and therefore cary significant diagnostic value as to the etiological location, or anatomical origin, of the pain. Expected duration of relief is directly dependent on the pharmacodynamics of  the local anesthetic used. Long-acting (4-6 hours) anesthetics used.  Reported result: Relief during the next 4 to 6 hour after the procedure: 100 % (Short-Term Relief)            Interpretative annotation: Clinically appropriate result. Analgesia during this period is likely to be Local Anesthetic-related.          Long-term benefit: Defined as the period of time past the expected duration of local anesthetics (1 hour for short-acting and 4-6 hours for long-acting). With the possible exception of prolonged sympathetic blockade from the local anesthetics, benefits during this period are typically attributed to, or associated with, other factors such as analgesic sensory neuropraxia, antiinflammatory  effects, or beneficial biochemical changes provided by agents other than the local anesthetics.  Reported result: Extended relief following procedure: 0 %(pain was back to what it was but continue to get worse) (Long-Term Relief) However, he has attained 100% relief of the pain in the right side. Interpretative annotation: Clinically possible results. No benefit. Therapeutic failure. No significant inflammatory component detected.          Current benefits: Defined as reported results that persistent at this point in time.   Analgesia: 0 % On the left side and 100% relief on the right.  This 100% relief is still ongoing. Function: Back to baseline on the left. ROM: Back to baseline on the left. Interpretative annotation: Recurrence of symptoms on the left side. Therapeutic failure on the left side but 100% resolution of his symptoms on the right. Results would suggest persistent aggravating factors on the left side.          Interpretation: Results would suggest Mr. Hillmer to be a good candidate for a RACZ Procedure.                  Plan:  Please see "Plan of Care" for details.       He last had a Racz procedure in October 2019.  This procedure did provide.  Him with significant relief of the pain  Laboratory Chemistry  Inflammation Markers (CRP: Acute Phase) (ESR: Chronic Phase) Lab Results  Component Value Date   CRP 13 (H) 01/24/2018   ESRSEDRATE 8 01/24/2018                         Renal Markers Lab Results  Component Value Date   BUN 8 01/24/2018   CREATININE 1.02 01/24/2018   BCR 8 (L) 01/24/2018   GFRAA 107 01/24/2018   GFRNONAA 93 01/24/2018                             Hepatic Markers Lab Results  Component Value Date   AST 22 01/24/2018   ALBUMIN 4.4 01/24/2018                        Note: Lab results reviewed.  Recent Imaging Results   Results for orders placed in visit on 07/26/18  DG C-Arm 1-60 Min-No Report   Narrative Fluoroscopy was utilized by the  requesting physician.  No radiographic  interpretation.        Interpretation Report: Fluoroscopy was used during the procedure to assist with needle guidance. The images were interpreted intraoperatively by the requesting physician.  Damage to the left shows the left SI joint injection.  The one on the right shows the prior Racz procedure in the spread  of contrast.  Of note is the fact that it does not show any of the sacral nerve roots below the S1 level.  This would suggest the presence of persistent scar tissue in that region.  This may also explain why he has experienced better relief of the pain in the upper portion of the lower back as opposed to the left lower area.  Because of this, I plan to go to the area of the sacral nerve roots on the left side for his next Racz procedure treatment.  Meds   Current Outpatient Medications:  .  Calcium Carbonate-Vit D-Min (GNP CALCIUM 1200) 1200-1000 MG-UNIT CHEW, Chew 1,200 mg by mouth daily with breakfast. Take in combination with vitamin D and magnesium., Disp: 30 tablet, Rfl: 5 .  Cholecalciferol (VITAMIN D3) 125 MCG (5000 UT) CAPS, Take 1 capsule (5,000 Units total) by mouth daily with breakfast. Take along with calcium and magnesium., Disp: 30 capsule, Rfl: 5 .  cyclobenzaprine (FLEXERIL) 10 MG tablet, Take 1 tablet (10 mg total) by mouth at bedtime., Disp: 30 tablet, Rfl: 2 .  Magnesium 500 MG CAPS, Take 1 capsule (500 mg total) by mouth 2 (two) times daily at 8 am and 10 pm., Disp: 60 capsule, Rfl: 5 .  [START ON 09/24/2018] Oxycodone HCl 10 MG TABS, Take 1 tablet (10 mg total) by mouth every 6 (six) hours as needed for up to 30 days (Take only if needed, not around-the-clock.)., Disp: 120 tablet, Rfl: 0 .  [START ON 08/25/2018] Oxycodone HCl 10 MG TABS, Take 1 tablet (10 mg total) by mouth every 6 (six) hours as needed for up to 30 days., Disp: 120 tablet, Rfl: 0 .  tiZANidine (ZANAFLEX) 4 MG tablet, Take 1 tablet (4 mg total) by mouth 2 (two)  times daily as needed for muscle spasms (For muscle pain/spasm during the day.)., Disp: 60 tablet, Rfl: 2  ROS  Constitutional: Denies any fever or chills Gastrointestinal: No reported hemesis, hematochezia, vomiting, or acute GI distress Musculoskeletal: Denies any acute onset joint swelling, redness, loss of ROM, or weakness Neurological: No reported episodes of acute onset apraxia, aphasia, dysarthria, agnosia, amnesia, paralysis, loss of coordination, or loss of consciousness  Allergies  Mr. Mata is allergic to amoxicillin and vicodin [hydrocodone-acetaminophen].  [place]  Drug: Mr. Jaros  reports no history of drug use. Alcohol:  reports current alcohol use. Tobacco:  reports that he has been smoking. He has a 16.00 pack-year smoking history. He has never used smokeless tobacco. Medical:  has a past medical history of Anxiety, Arthritis, and GERD (gastroesophageal reflux disease). Surgical: Mr. Couey  has a past surgical history that includes Back surgery; HAND REIMPLANTED; and Lumbar fusion (11/14). Family: family history is not on file.  Constitutional Exam  General appearance: Well nourished, well developed, and well hydrated. In no apparent acute distress Vitals:   08/24/18 0806  BP: 113/85  Pulse: 96  Temp: 98.2 F (36.8 C)  SpO2: 98%  Weight: 180 lb (81.6 kg)  Height: '5\' 10"'  (1.778 m)   BMI Assessment: Estimated body mass index is 25.83 kg/m as calculated from the following:   Height as of this encounter: '5\' 10"'  (1.778 m).   Weight as of this encounter: 180 lb (81.6 kg).  BMI interpretation table: BMI level Category Range association with higher incidence of chronic pain  <18 kg/m2 Underweight   18.5-24.9 kg/m2 Ideal body weight   25-29.9 kg/m2 Overweight Increased incidence by 20%  30-34.9 kg/m2 Obese (Class I) Increased incidence  by 68%  35-39.9 kg/m2 Severe obesity (Class II) Increased incidence by 136%  >40 kg/m2 Extreme obesity (Class III) Increased incidence  by 254%   Patient's current BMI Ideal Body weight  Body mass index is 25.83 kg/m. Ideal body weight: 73 kg (160 lb 15 oz) Adjusted ideal body weight: 76.5 kg (168 lb 9 oz)   BMI Readings from Last 4 Encounters:  08/24/18 25.83 kg/m  08/22/18 27.41 kg/m  07/26/18 27.41 kg/m  07/21/18 25.97 kg/m   Wt Readings from Last 4 Encounters:  08/24/18 180 lb (81.6 kg)  08/22/18 191 lb (86.6 kg)  07/26/18 191 lb (86.6 kg)  07/21/18 181 lb (82.1 kg)  Psych/Mental status: Alert, oriented x 3 (person, place, & time)       Eyes: PERLA Respiratory: No evidence of acute respiratory distress  Cervical Spine Area Exam  Skin & Axial Inspection: No masses, redness, edema, swelling, or associated skin lesions Alignment: Symmetrical Functional ROM: Unrestricted ROM      Stability: No instability detected Muscle Tone/Strength: Functionally intact. No obvious neuro-muscular anomalies detected. Sensory (Neurological): Unimpaired Palpation: No palpable anomalies              Upper Extremity (UE) Exam    Side: Right upper extremity  Side: Left upper extremity  Skin & Extremity Inspection: Skin color, temperature, and hair growth are WNL. No peripheral edema or cyanosis. No masses, redness, swelling, asymmetry, or associated skin lesions. No contractures.  Skin & Extremity Inspection: Skin color, temperature, and hair growth are WNL. No peripheral edema or cyanosis. No masses, redness, swelling, asymmetry, or associated skin lesions. No contractures.  Functional ROM: Unrestricted ROM          Functional ROM: Unrestricted ROM          Muscle Tone/Strength: Functionally intact. No obvious neuro-muscular anomalies detected.  Muscle Tone/Strength: Functionally intact. No obvious neuro-muscular anomalies detected.  Sensory (Neurological): Unimpaired          Sensory (Neurological): Unimpaired          Palpation: No palpable anomalies              Palpation: No palpable anomalies              Provocative  Test(s):  Phalen's test: deferred Tinel's test: deferred Apley's scratch test (touch opposite shoulder):  Action 1 (Across chest): deferred Action 2 (Overhead): deferred Action 3 (LB reach): deferred   Provocative Test(s):  Phalen's test: deferred Tinel's test: deferred Apley's scratch test (touch opposite shoulder):  Action 1 (Across chest): deferred Action 2 (Overhead): deferred Action 3 (LB reach): deferred    Thoracic Spine Area Exam  Skin & Axial Inspection: No masses, redness, or swelling Alignment: Symmetrical Functional ROM: Unrestricted ROM Stability: No instability detected Muscle Tone/Strength: Functionally intact. No obvious neuro-muscular anomalies detected. Sensory (Neurological): Unimpaired Muscle strength & Tone: No palpable anomalies  Lumbar Spine Area Exam  Skin & Axial Inspection: No masses, redness, or swelling Alignment: Symmetrical Functional ROM: Unrestricted ROM       Stability: No instability detected Muscle Tone/Strength: Functionally intact. No obvious neuro-muscular anomalies detected. Sensory (Neurological): Unimpaired Palpation: No palpable anomalies       Provocative Tests: Hyperextension/rotation test: deferred today       Lumbar quadrant test (Kemp's test): deferred today       Lateral bending test: deferred today       Patrick's Maneuver: deferred today  FABER* test: deferred today                   S-I anterior distraction/compression test: deferred today         S-I lateral compression test: deferred today         S-I Thigh-thrust test: deferred today         S-I Gaenslen's test: deferred today         *(Flexion, ABduction and External Rotation)  Gait & Posture Assessment  Ambulation: Unassisted Gait: Relatively normal for age and body habitus Posture: WNL   Lower Extremity Exam    Side: Right lower extremity  Side: Left lower extremity  Stability: No instability observed          Stability: No instability  observed          Skin & Extremity Inspection: Skin color, temperature, and hair growth are WNL. No peripheral edema or cyanosis. No masses, redness, swelling, asymmetry, or associated skin lesions. No contractures.  Skin & Extremity Inspection: Skin color, temperature, and hair growth are WNL. No peripheral edema or cyanosis. No masses, redness, swelling, asymmetry, or associated skin lesions. No contractures.  Functional ROM: Unrestricted ROM                  Functional ROM: Unrestricted ROM                  Muscle Tone/Strength: Functionally intact. No obvious neuro-muscular anomalies detected.  Muscle Tone/Strength: Functionally intact. No obvious neuro-muscular anomalies detected.  Sensory (Neurological): Unimpaired        Sensory (Neurological): Unimpaired        DTR: Patellar: deferred today Achilles: deferred today Plantar: deferred today  DTR: Patellar: deferred today Achilles: deferred today Plantar: deferred today  Palpation: No palpable anomalies  Palpation: No palpable anomalies   Assessment   Status Diagnosis  Controlled Controlled Controlled 1. Chronic low back pain (Primary Area of Pain) (Bilateral) (L>R) w/ sciatica (Bilateral)   2. Chronic lower extremity pain (Secondary Area of Pain) (Bilateral) (L>R)   3. Failed back surgical syndrome (x3)   4. Epidural fibrosis   5. Lumbar pseudoarthrosis (L5-S1)      Updated Problems: No problems updated.  Plan of Care  Pharmacotherapy (Medications Ordered): No orders of the defined types were placed in this encounter.  Medications administered today: [name] had no medications administered during this visit.   Procedure Orders     Racz (One Day) Lab Orders  No laboratory test(s) ordered today   Imaging Orders  No imaging studies ordered today   Referral Orders  No referral(s) requested today   Interventional management options: Planned, scheduled, and/or pending:   Therapeutic left-sided RACZ  procedure #2 under fluoroscopic guidance IV sedation.  I will be targeting the area of the S2 and S3 nerve roots on the left side.   Considering:   Diagnostic bilateral lumbar facet block Possible bilateral lumbar facet RFA Diagnostic bilateral sacroiliac joint block Possible bilateral sacroiliac joint RFA Possible spinal cord stimulator trial   Palliative PRN treatment(s):   Therapeutic Racz procedure #2(last done on 04/07/18)   Provider-requested follow-up: Return for Procedure (w/ sedation): RACZ procedure #2.  Future Appointments  Date Time Provider Patrick  10/18/2018  8:00 AM Vevelyn Francois, NP [facility] None   Primary Care Physician: Jodi Marble, MD Location: [facility] Note by: Gaspar Cola, MD Date: 08/24/2018; Time: 8:47 AM

## 2018-08-24 ENCOUNTER — Encounter: Payer: Self-pay | Admitting: Pain Medicine

## 2018-08-24 ENCOUNTER — Ambulatory Visit: Payer: BLUE CROSS/BLUE SHIELD | Attending: Pain Medicine | Admitting: Pain Medicine

## 2018-08-24 ENCOUNTER — Telehealth: Payer: Self-pay

## 2018-08-24 ENCOUNTER — Other Ambulatory Visit: Payer: Self-pay

## 2018-08-24 VITALS — BP 113/85 | HR 96 | Temp 98.2°F | Ht 70.0 in | Wt 180.0 lb

## 2018-08-24 DIAGNOSIS — M79605 Pain in left leg: Secondary | ICD-10-CM | POA: Diagnosis present

## 2018-08-24 DIAGNOSIS — M5442 Lumbago with sciatica, left side: Secondary | ICD-10-CM | POA: Diagnosis not present

## 2018-08-24 DIAGNOSIS — G9619 Other disorders of meninges, not elsewhere classified: Secondary | ICD-10-CM

## 2018-08-24 DIAGNOSIS — M79604 Pain in right leg: Secondary | ICD-10-CM

## 2018-08-24 DIAGNOSIS — G8929 Other chronic pain: Secondary | ICD-10-CM | POA: Diagnosis present

## 2018-08-24 DIAGNOSIS — G96198 Other disorders of meninges, not elsewhere classified: Secondary | ICD-10-CM

## 2018-08-24 DIAGNOSIS — S32009K Unspecified fracture of unspecified lumbar vertebra, subsequent encounter for fracture with nonunion: Secondary | ICD-10-CM

## 2018-08-24 DIAGNOSIS — M961 Postlaminectomy syndrome, not elsewhere classified: Secondary | ICD-10-CM

## 2018-08-24 DIAGNOSIS — M5441 Lumbago with sciatica, right side: Secondary | ICD-10-CM

## 2018-08-24 NOTE — Telephone Encounter (Signed)
Kiwannia notified and order sent to pharmacy.

## 2018-08-24 NOTE — Telephone Encounter (Signed)
Kori- Please take care of this- thank you

## 2018-08-24 NOTE — Patient Instructions (Signed)
____________________________________________________________________________________________  Preparing for Procedure with Sedation  Instructions: . Oral Intake: Do not eat or drink anything for at least 8 hours prior to your procedure. . Transportation: Public transportation is not allowed. Bring an adult driver. The driver must be physically present in our waiting room before any procedure can be started. . Physical Assistance: Bring an adult physically capable of assisting you, in the event you need help. This adult should keep you company at home for at least 6 hours after the procedure. . Blood Pressure Medicine: Take your blood pressure medicine with a sip of water the morning of the procedure. . Blood thinners: Notify our staff if you are taking any blood thinners. Depending on which one you take, there will be specific instructions on how and when to stop it. . Diabetics on insulin: Notify the staff so that you can be scheduled 1st case in the morning. If your diabetes requires high dose insulin, take only  of your normal insulin dose the morning of the procedure and notify the staff that you have done so. . Preventing infections: Shower with an antibacterial soap the morning of your procedure. . Build-up your immune system: Take 1000 mg of Vitamin C with every meal (3 times a day) the day prior to your procedure. . Antibiotics: Inform the staff if you have a condition or reason that requires you to take antibiotics before dental procedures. . Pregnancy: If you are pregnant, call and cancel the procedure. . Sickness: If you have a cold, fever, or any active infections, call and cancel the procedure. . Arrival: You must be in the facility at least 30 minutes prior to your scheduled procedure. . Children: Do not bring children with you. . Dress appropriately: Bring dark clothing that you would not mind if they get stained. . Valuables: Do not bring any jewelry or valuables.  Procedure  appointments are reserved for interventional treatments only. . No Prescription Refills. . No medication changes will be discussed during procedure appointments. . No disability issues will be discussed.  Reasons to call and reschedule or cancel your procedure: (Following these recommendations will minimize the risk of a serious complication.) . Surgeries: Avoid having procedures within 2 weeks of any surgery. (Avoid for 2 weeks before or after any surgery). . Flu Shots: Avoid having procedures within 2 weeks of a flu shots or . (Avoid for 2 weeks before or after immunizations). . Barium: Avoid having a procedure within 7-10 days after having had a radiological study involving the use of radiological contrast. (Myelograms, Barium swallow or enema study). . Heart attacks: Avoid any elective procedures or surgeries for the initial 6 months after a "Myocardial Infarction" (Heart Attack). . Blood thinners: It is imperative that you stop these medications before procedures. Let us know if you if you take any blood thinner.  . Infection: Avoid procedures during or within two weeks of an infection (including chest colds or gastrointestinal problems). Symptoms associated with infections include: Localized redness, fever, chills, night sweats or profuse sweating, burning sensation when voiding, cough, congestion, stuffiness, runny nose, sore throat, diarrhea, nausea, vomiting, cold or Flu symptoms, recent or current infections. It is specially important if the infection is over the area that we intend to treat. . Heart and lung problems: Symptoms that may suggest an active cardiopulmonary problem include: cough, chest pain, breathing difficulties or shortness of breath, dizziness, ankle swelling, uncontrolled high or unusually low blood pressure, and/or palpitations. If you are experiencing any of these symptoms, cancel   your procedure and contact your primary care physician for an evaluation.  Remember:   Regular Business hours are:  Monday to Thursday 8:00 AM to 4:00 PM  Provider's Schedule: Nalu Troublefield, MD:  Procedure days: Tuesday and Thursday 7:30 AM to 4:00 PM  Bilal Lateef, MD:  Procedure days: Monday and Wednesday 7:30 AM to 4:00 PM ____________________________________________________________________________________________    

## 2018-08-24 NOTE — Telephone Encounter (Signed)
[facility] procedure scheduled for March 5 at 0745. I called Lear Ng and she said to send down the paperwork.

## 2018-09-08 ENCOUNTER — Other Ambulatory Visit: Payer: Self-pay

## 2018-09-08 ENCOUNTER — Encounter: Payer: Self-pay | Admitting: Pain Medicine

## 2018-09-08 ENCOUNTER — Ambulatory Visit
Admission: RE | Admit: 2018-09-08 | Discharge: 2018-09-08 | Disposition: A | Discharge status: Home / Self Care | Payer: BLUE CROSS/BLUE SHIELD | Source: Ambulatory Visit | Attending: Pain Medicine | Admitting: Pain Medicine

## 2018-09-08 ENCOUNTER — Ambulatory Visit: Payer: BLUE CROSS/BLUE SHIELD | Admitting: Pain Medicine

## 2018-09-08 ENCOUNTER — Ambulatory Visit (HOSPITAL_BASED_OUTPATIENT_CLINIC_OR_DEPARTMENT_OTHER): Payer: BLUE CROSS/BLUE SHIELD | Admitting: Pain Medicine

## 2018-09-08 VITALS — BP 116/53 | HR 85 | Temp 98.0°F | Resp 18 | Ht 70.0 in | Wt 179.0 lb

## 2018-09-08 DIAGNOSIS — M79605 Pain in left leg: Secondary | ICD-10-CM

## 2018-09-08 DIAGNOSIS — G8929 Other chronic pain: Secondary | ICD-10-CM

## 2018-09-08 DIAGNOSIS — G9619 Other disorders of meninges, not elsewhere classified: Secondary | ICD-10-CM | POA: Insufficient documentation

## 2018-09-08 DIAGNOSIS — M5441 Lumbago with sciatica, right side: Secondary | ICD-10-CM | POA: Insufficient documentation

## 2018-09-08 DIAGNOSIS — M79604 Pain in right leg: Secondary | ICD-10-CM | POA: Diagnosis present

## 2018-09-08 DIAGNOSIS — M960 Pseudarthrosis after fusion or arthrodesis: Secondary | ICD-10-CM | POA: Diagnosis present

## 2018-09-08 DIAGNOSIS — M5137 Other intervertebral disc degeneration, lumbosacral region: Secondary | ICD-10-CM

## 2018-09-08 DIAGNOSIS — M961 Postlaminectomy syndrome, not elsewhere classified: Secondary | ICD-10-CM | POA: Insufficient documentation

## 2018-09-08 DIAGNOSIS — M5442 Lumbago with sciatica, left side: Secondary | ICD-10-CM | POA: Insufficient documentation

## 2018-09-08 DIAGNOSIS — G96198 Other disorders of meninges, not elsewhere classified: Secondary | ICD-10-CM

## 2018-09-08 MED ORDER — LIDOCAINE-EPINEPHRINE (PF) 2 %-1:200000 IJ SOLN
10.0000 mL | Freq: Once | INTRAMUSCULAR | Status: AC
  Administered 2018-09-08: 20 mL
  Filled 2018-09-08: qty 20

## 2018-09-08 MED ORDER — MIDAZOLAM HCL 5 MG/5ML IJ SOLN
1.0000 mg | INTRAMUSCULAR | Status: DC | PRN
  Administered 2018-09-08: 5 mg via INTRAVENOUS
  Filled 2018-09-08: qty 5

## 2018-09-08 MED ORDER — IOPAMIDOL (ISOVUE-M 200) INJECTION 41%
10.0000 mL | Freq: Once | INTRAMUSCULAR | Status: AC
  Administered 2018-09-08: 10 mL via EPIDURAL
  Filled 2018-09-08: qty 10

## 2018-09-08 MED ORDER — ROPIVACAINE HCL 2 MG/ML IJ SOLN
4.0000 mL | Freq: Once | INTRAMUSCULAR | Status: AC
  Administered 2018-09-08: 10 mL via EPIDURAL
  Filled 2018-09-08: qty 10

## 2018-09-08 MED ORDER — GENTAMICIN SULFATE 40 MG/ML IJ SOLN
120.0000 mg | Freq: Once | INTRAVENOUS | Status: AC
  Administered 2018-09-08: 120 mg via INTRAVENOUS
  Filled 2018-09-08: qty 3

## 2018-09-08 MED ORDER — HYALURONIDASE HUMAN 150 UNIT/ML IJ SOLN
1500.0000 [IU] | Freq: Once | INTRAMUSCULAR | Status: AC
  Administered 2018-09-08: 150 [IU] via INTRADERMAL
  Filled 2018-09-08: qty 10

## 2018-09-08 MED ORDER — SODIUM CHLORIDE (PF) 0.9 % IJ SOLN
INTRAMUSCULAR | Status: AC
  Filled 2018-09-08: qty 10

## 2018-09-08 MED ORDER — TRIAMCINOLONE ACETONIDE 40 MG/ML IJ SUSP
40.0000 mg | Freq: Once | INTRAMUSCULAR | Status: AC
  Administered 2018-09-08: 40 mg
  Filled 2018-09-08: qty 1

## 2018-09-08 MED ORDER — FENTANYL CITRATE (PF) 100 MCG/2ML IJ SOLN
25.0000 ug | INTRAMUSCULAR | Status: DC | PRN
  Administered 2018-09-08: 100 ug via INTRAVENOUS
  Filled 2018-09-08: qty 2

## 2018-09-08 MED ORDER — LIDOCAINE HCL 2 % IJ SOLN
20.0000 mL | Freq: Once | INTRAMUSCULAR | Status: AC
  Administered 2018-09-08: 400 mg
  Filled 2018-09-08: qty 20

## 2018-09-08 MED ORDER — SODIUM CHLORIDE 0.9% FLUSH
4.0000 mL | Freq: Once | INTRAVENOUS | Status: AC
  Administered 2018-09-08: 10 mL

## 2018-09-08 MED ORDER — LACTATED RINGERS IV SOLN
1000.0000 mL | Freq: Once | INTRAVENOUS | Status: AC
  Administered 2018-09-08: 1000 mL via INTRAVENOUS

## 2018-09-08 MED ORDER — STERILE WATER FOR INJECTION IJ SOLN
10.0000 mL | Freq: Once | INTRAVENOUS | Status: DC
  Filled 2018-09-08: qty 4.3

## 2018-09-08 NOTE — Progress Notes (Signed)
1044 [name] here to inject local/steroid. 1111 [name] here to inject hypertonic saline. Catheter removed and intact per MD. Tolerated well.

## 2018-09-08 NOTE — Progress Notes (Signed)
Patient's Name: Derek [name]  MRN: [id number]  Referring Provider: Jodi Marble, MD  DOB: 01/18/1981  PCP: Jodi Marble, MD  DOS: 09/08/2018  Note by: Gaspar Cola, MD  Service setting: Ambulatory outpatient  Specialty: Interventional Pain Management  Patient type: Established  Location: [facility] (AMB) Pain Management Facility  Visit type: Interventional Procedure   Primary Reason for Visit: Interventional Pain Management Treatment. CC: Back Pain (lower) and Leg Pain (left)  Procedure:          Anesthesia, Analgesia, Anxiolysis:  Type: Therapeutic Percutaneous Epidural Neuroplasty and Lysis of Adhesions (RACZ Procedure)  #2  Region: Caudal Level: Sacrococcygeal   Laterality: Midline aiming at the left  Type: Moderate (Conscious) Sedation combined with Local Anesthesia Indication(s): Analgesia and Anxiety Route: Intravenous (IV) IV Access: Secured Sedation: Meaningful verbal contact was maintained at all times during the procedure  Local Anesthetic: Lidocaine 1-2%  Position: Prone   Indications: 1. Lumbar postlaminectomy syndrome   2. Failed back surgical syndrome (x3)   3. Epidural fibrosis   4. L5-S1 pseudoarthrosis   5. DDD (degenerative disc disease), lumbosacral   6. Chronic low back pain (Primary Area of Pain) (Bilateral) (L>R) w/ sciatica (Bilateral)   7. Chronic lower extremity pain (Secondary Area of Pain) (Bilateral) (L>R)    Pain Score: Pre-procedure: 3 /10 Post-procedure: 1 /10  Pre-op Assessment:  Derek [name] is a 38 y.o. (year old), male patient, seen today for interventional treatment. He  has a past surgical history that includes Back surgery; HAND REIMPLANTED; and Lumbar fusion (11/14). Derek [name] has a current medication list which includes the following prescription(s): gnp calcium 1200, vitamin d3, cyclobenzaprine, magnesium, oxycodone hcl, oxycodone hcl, and tizanidine, and the following Facility-Administered Medications: fentanyl, midazolam, and  sodium chloride hypertonic 10% epidural injection. His primarily concern today is the Back Pain (lower) and Leg Pain (left)  Initial Vital Signs:  Pulse/HCG Rate: 85ECG Heart Rate: 71 Temp: 98 F (36.7 C) Resp: 16 BP: 127/77 SpO2: 99 %  BMI: Estimated body mass index is 25.68 kg/m as calculated from the following:   Height as of this encounter: 5' 10" (1.778 m).   Weight as of this encounter: 179 lb (81.2 kg).  Risk Assessment: Allergies: Reviewed. He is allergic to amoxicillin and vicodin [hydrocodone-acetaminophen].  Allergy Precautions: None required Coagulopathies: Reviewed. None identified.  Blood-thinner therapy: None at this time Active Infection(s): Reviewed. None identified. Derek [name] is afebrile  Site Confirmation: Derek [name] was asked to confirm the procedure and laterality before marking the site Procedure checklist: Completed Consent: Before the procedure and under the influence of no sedative(s), amnesic(s), or anxiolytics, the patient was informed of the treatment options, risks and possible complications. To fulfill our ethical and legal obligations, as recommended by the American Medical Association's Code of Ethics, I have informed the patient of my clinical impression; the nature and purpose of the treatment or procedure; the risks, benefits, and possible complications of the intervention; the alternatives, including doing nothing; the risk(s) and benefit(s) of the alternative treatment(s) or procedure(s); and the risk(s) and benefit(s) of doing nothing. The patient was provided information about the general risks and possible complications associated with the procedure. These may include, but are not limited to: failure to achieve desired goals, infection, bleeding, organ or nerve damage, allergic reactions, paralysis, and death. In addition, the patient was informed of those risks and complications associated to Spine-related procedures, such as failure to decrease pain;  infection (i.e.: Meningitis, epidural or intraspinal abscess); bleeding (  i.e.: epidural hematoma, subarachnoid hemorrhage, or any other type of intraspinal or peri-dural bleeding); organ or nerve damage (i.e.: Any type of peripheral nerve, nerve root, or spinal cord injury) with subsequent damage to sensory, motor, and/or autonomic systems, resulting in permanent pain, numbness, and/or weakness of one or several areas of the body; allergic reactions; (i.e.: anaphylactic reaction); and/or death. Furthermore, the patient was informed of those risks and complications associated with the medications. These include, but are not limited to: allergic reactions (i.e.: anaphylactic or anaphylactoid reaction(s)); adrenal axis suppression; blood sugar elevation that in diabetics may result in ketoacidosis or comma; water retention that in patients with history of congestive heart failure may result in shortness of breath, pulmonary edema, and decompensation with resultant heart failure; weight gain; swelling or edema; medication-induced neural toxicity; particulate matter embolism and blood vessel occlusion with resultant organ, and/or nervous system infarction; and/or aseptic necrosis of one or more joints. Finally, the patient was informed that Medicine is not an exact science; therefore, there is also the possibility of unforeseen or unpredictable risks and/or possible complications that may result in a catastrophic outcome. The patient indicated having understood very clearly. We have given the patient no guarantees and we have made no promises. Enough time was given to the patient to ask questions, all of which were answered to the patient's satisfaction. Derek [name] has indicated that he wanted to continue with the procedure. Attestation: I, the ordering provider, attest that I have discussed with the patient the benefits, risks, side-effects, alternatives, likelihood of achieving goals, and potential problems during  recovery for the procedure that I have provided informed consent. Date   Time: 09/08/2018  8:35 AM  Pre-Procedure Preparation:  Monitoring: As per clinic protocol. Respiration, ETCO2, SpO2, BP, heart rate and rhythm monitor placed and checked for adequate function Safety Precautions: Patient was assessed for positional comfort and pressure points before starting the procedure. Time-out: I initiated and conducted the "Time-out" before starting the procedure, as per protocol. The patient was asked to participate by confirming the accuracy of the "Time Out" information. Verification of the correct person, site, and procedure were performed and confirmed by me, the nursing staff, and the patient. "Time-out" conducted as per Joint Commission's Universal Protocol (UP.01.01.01). Time: 0959  Description of Procedure:          Target Area: Caudal Epidural Canal Approach: Midline approach Area Prepped: Entire Posterior Sacrococcygeal Region Prepping solution: ChloraPrep (2% chlorhexidine gluconate and 70% isopropyl alcohol) Safety Precautions: Aspiration looking for blood return was conducted prior to all injections. At no point did I inject any substances, as a needle was being advanced. No attempts were made at seeking any paresthesias. Safe injection practices and needle disposal techniques used. Medications properly checked for expiration dates. SDV (single dose vial) medications used. Description of the Procedure: Protocol guidelines were followed. The patient was placed in position over the fluoroscopy table. Patient assessed for comfort and pressure points before starting procedure. The target area was identified and the area prepped in the usual manner. Skin & deeper tissues infiltrated with local anesthetic. Appropriate amount of time allowed to pass for local anesthetics to take effect. The epidural needle was then advanced to the target area, via the sacral hiatus, between the sacral cornu. Proper  needle placement secured. Negative aspiration confirmed. Step 1: Epidurogram performed by slowly injecting a non-ionic, water-soluble, hypoallergenic, myelogram-compatible radiological contrast. Defect identified and Racz catheter advanced slowly next to rest proximal to it without attempting to perforate or puncture  the defect. At this point, the epidural needle was removed. Step 2: Contrast was again injected, this time thru the catheter, under live fluoroscopy, closely observing for vascular uptake or intrathecal spread. Step 3: Once no vascular uptake or intrathecal spread was confirmed, a 2 mL test-dose using 2% PF-Lidocaine with 1:200,000 Epinephrine was injected thru the catheter, while closely monitoring for an increase in heart rate or evidence of spinal anesthesia. Step 4: After waiting over 5 minutes, the patient was assessed for evidence of a spinal block. Step 5: Once I had confirmed that there was no vascular uptake or evidence of intrathecal spread, I then slowly injected 1,500 Units of hyaluronidase, carefully monitoring for allergic reactions. I then waited 10 minutes, using this time to secure the catheter using a sterile benzoin tincture swab and (42m x 100 mm) steri-strip. After confirming vitals to be stable, the patient was transferred to the recovery area where Mr. JCovaultwas kept under constant observation and monitored as per post-sedation protocol. Step 6: After the 10 minutes, I proceeded to slowly inject a solution containing 0.2% MPF-Ropivacaine (4 mL) + 0.9% PF-NSS (5 mL) + SDV Triamcinolone 40 mg/mL (1 mL), in intermittent fashion, asking for systemic symptoms every 0.5cc of injectate. Step 7:  30 minutes later, a neurological exam was conducted for any evidence of a spinal blockade. Step 8:  After confirming the absence of anesthesia, I slowly injected the 10% PF-Hypertonic Saline, stopping to assess, any time the patient described any discomfort. Once the procedure was  completed, I removed the catheter, taking time to show the patient its spring tip, and demonstrating to the patient that none had been left behind. EBL: None Materials & Medications used:  1. Racz Tun-L-Kath (Epimed, [place] Catheter. (or similar)(Perifix 20Gx100cm) 2. 2 Foam Tape 3. Benzoin tincture swab 4. Steri-Strip (181mx 100 mm) 5. Non-occlusive Transparent Dressing (35M Tegaderm) 6. Bacteriostatic Filter for Epidural Catheter 7. Epidural Kit for 20G catheter Needle(s) used: 20g - 10cm, Tuohy-style epidural needle   Medications used:  1. Skin infiltration: 2.0% Lidocaine (1019m2. Test-dose: 1.5 % Lidocaine w/ Epi 1:200,000 (5ml71m. Epidural Steroid injection:  A). Steroid: Triamcinolone 40 mg/mL (SDV) (1ml)12m. Local Anesthetic: 0.2% PF-Ropivacaine (2 mg/mL) (4 mL) C). Dilution agent: 0.9% PF-NSS (injectable saline) (5 mL) 6. Neurolytic Agent: 10% PF-Sodium Chloride (Hypertonic Saline) (10ml)71m.4% PF-Sodium Chloride (4.273mL) 16m-Sterile H2O (5.727mL) =72m PF-Sodium Chloride (10mL)] 722mar softening agent: Hyaluronidase (150 units/mL) x (10 mL) = 1500 Units 8. Radiological Contrast Media: Isovue-M 200 (10 mL)  Vitals:   09/08/18 1045 09/08/18 1055 09/08/18 1105 09/08/18 1117  BP: 115/75 109/61 (!) 108/56 (!) 116/53  Pulse:      Resp: _0 Temp:      TempSrc:      SpO2: 99% 95% 93% 97%  Weight:      Height:        Start Time: 0959 hrs. End Time: 1023 hrs.  Epidurogram:  Epidurogram flow pattern demonstrated a restricted low at the level of the surgery. The epidural catheter was placed next to this restriction without attempting to perforate it.  The contrast was observed to have gone up to the mid body of L5 with some spread favoring the right side, but no initial evidence of showing the L5 or S1 on the left side.  Since his symptoms appear to be primarily following an S1 dermatomal distribution, we position our catheter at that level on the left  side.  Materials:  Needle(s) Type: Epidural needle Gauge: 20G Length: 3.5-in Medication(s): Please see orders for medications and dosing details.  Imaging Guidance (Spinal):          Type of Imaging Technique: Fluoroscopy Guidance (Spinal) Indication(s): Assistance in needle guidance and placement for procedures requiring needle placement in or near specific anatomical locations not easily accessible without such assistance. Exposure Time: Please see nurses notes. Contrast: Before injecting any contrast, we confirmed that the patient did not have an allergy to iodine, shellfish, or radiological contrast. Once satisfactory needle placement was completed at the desired level, radiological contrast was injected. Contrast injected under live fluoroscopy. No contrast complications. See chart for type and volume of contrast used. Fluoroscopic Guidance: I was personally present during the use of fluoroscopy. "Tunnel Vision Technique" used to obtain the best possible view of the target area. Parallax error corrected before commencing the procedure. "Direction-depth-direction" technique used to introduce the needle under continuous pulsed fluoroscopy. Once target was reached, antero-posterior, oblique, and lateral fluoroscopic projection used confirm needle placement in all planes. Images permanently stored in EMR. Interpretation: I personally interpreted the imaging intraoperatively. Adequate needle placement confirmed in multiple planes. Appropriate spread of contrast into desired area was observed. No evidence of afferent or efferent intravascular uptake. No intrathecal or subarachnoid spread observed. Permanent images saved into the patient's record.  Antibiotic Prophylaxis:   Anti-infectives (From admission, onward)   Start     Dose/Rate Route Frequency Ordered Stop   09/08/18 0915  gentamicin (GARAMYCIN) 120 mg in dextrose 5 % 50 mL IVPB     120 mg 106 mL/hr over 30 Minutes Intravenous  Once  09/08/18 0822 09/08/18 1010     Indication(s): None identified  Post-operative Assessment:  Post-procedure Vital Signs:  Pulse/HCG Rate: 8579 Temp: 98 F (36.7 C) Resp: 18 BP: (!) 116/53 SpO2: 97 %  EBL: None  Complications: No immediate post-treatment complications observed by team, or reported by patient.  Note: The patient tolerated the entire procedure well. A repeat set of vitals were taken after the procedure and the patient was kept under observation following institutional policy, for this type of procedure. Post-procedural neurological assessment was performed, showing return to baseline, prior to discharge. The patient was provided with post-procedure discharge instructions, including a section on how to identify potential problems. Should any problems arise concerning this procedure, the patient was given instructions to immediately contact us, at any time, without hesitation. In any case, we plan to contact the patient by telephone for a follow-up status report regarding this interventional procedure.  Comments:  No additional relevant information.  Plan of Care  Orders:  Orders Placed This Encounter  Procedures   Racz (One Day)    Scheduling Instructions:     Side: Midline     Sedation: Patient is requesting sedation     Timeframe: Today   DG C-Arm 1-60 Min-No Report    Intraoperative interpretation by procedural physician at [place].    Standing Status:   Standing    Number of Occurrences:   1    Order Specific Question:   Reason for exam:    Answer:   Assistance in needle guidance and placement for procedures requiring needle placement in or near specific anatomical locations not easily accessible without such assistance.   Provider attestation of informed consent for procedure/surgical case    I, the ordering provider, attest that I have discussed with the patient the benefits, risks, side effects, alternatives, likelihood of achieving goals and  potential problems during recovery for the procedure that I have provided informed consent.    Standing Status:   Standing    Number of Occurrences:   1   Informed Consent Details: Transcribe to consent form and obtain patient signature    Consent Attestation: I, the ordering provider, attest that I have discussed with the patient the benefits, risks, side-effects, alternatives, likelihood of achieving goals, and potential problems during recovery for the procedure that I have provided informed consent.    Standing Status:   Standing    Number of Occurrences:   1    Order Specific Question:   Procedure    Answer:   Therapeutic Epidural Lysis of Adhesions (RACZ Procedure), under fluoroscopic guidance    Order Specific Question:   Surgeon    Answer:   Casmira Cramer A. 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 If not available, you may substitute with a water-soluble, non-ionic, hypoallergenic, myelogram-compatible radiological contrast medium.   lidocaine (XYLOCAINE) 2 % (with pres) injection 400 mg   midazolam (VERSED) 5 MG/5ML injection 1-2 mg    Make sure Flumazenil is available in the pyxis when using this medication. If oversedation occurs, administer 0.2 mg IV over 15 sec. If after 45 sec no response, administer 0.2 mg again over 1 min; may repeat at 1 min intervals; not to exceed 4 doses (1 mg)   fentaNYL (SUBLIMAZE) injection 25-50 mcg    Make sure Narcan is available in the pyxis when using this medication. In the event of respiratory depression (RR< 8/min): Titrate NARCAN (naloxone) in increments of 0.1 to 0.2 mg IV at 2-3 minute intervals, until desired degree of reversal.    lactated ringers infusion 1,000 mL   lidocaine-EPINEPHrine (XYLOCAINE W/EPI) 2 %-1:200000 (PF) injection 10 mL    If 2% is unavailable, may be substituted with 1.5%, but must be preservative-free. If 1:200,000 concentration of epinephrine is not available, it may be substituted with 1:100,000. Notify physician of changes, before procedure.   ropivacaine (PF) 2 mg/mL (0.2%) (NAROPIN) injection 4 mL   triamcinolone acetonide (KENALOG-40) injection 40 mg   sodium chloride flush (NS) 0.9 % injection 4 mL   hyaluronidase Human (HYLENEX) injection 1,500 Units    10 mL of the 150 Units/mL   sodium chloride hypertonic 10% epidural injection    For Racz Epidural Lysis of Adhesions.   gentamicin (GARAMYCIN) 120 mg in dextrose 5 % 50 mL IVPB    Order Specific Question:   Antibiotic Indication:    Answer:   Surgical Prophylaxis   Medications administered: We administered iopamidol, lidocaine, midazolam, fentaNYL, lactated ringers, lidocaine-EPINEPHrine, ropivacaine (PF) 2 mg/mL (0.2%), triamcinolone acetonide, sodium chloride flush, hyaluronidase Human, and gentamicin (GARAMYCIN) 120 mg in dextrose 5 % 50 mL IVPB.  See the medical record for exact dosing, route, and time of administration.  Disposition: Discharge home  Discharge Date & Time: 09/08/2018; 1130 hrs.   Follow-up plan:   Return for PPE (2 wks) w/ Dr. Dossie Arbour.   Future Appointments  Date Time Provider [place]  10/03/2018 10:15 AM Milinda Pointer, MD [facility] None  10/18/2018  8:00 AM Vevelyn Francois, NP [facility] None   Primary Care Physician: Jodi Marble,  MD Location: [facility] Facility Note by: Gaspar Cola, MD Date: 09/08/2018; Time: 11:31 AM  Disclaimer:  Medicine is not an exact science. The only guarantee in medicine is that nothing is guaranteed. It is important to note that the decision to proceed with this intervention was based on the information collected from the  patient. The Data and conclusions were drawn from the patient's questionnaire, the interview, and the physical examination. Because the information was provided in large part by the patient, it cannot be guaranteed that it has not been purposely or unconsciously manipulated. Every effort has been made to obtain as much relevant data as possible for this evaluation. It is important to note that the conclusions that lead to this procedure are derived in large part from the available data. Always take into account that the treatment will also be dependent on availability of resources and existing treatment guidelines, considered by other Pain Management Practitioners as being common knowledge and practice, at the time of the intervention. For Medico-Legal purposes, it is also important to point out that variation in procedural techniques and pharmacological choices are the acceptable norm. The indications, contraindications, technique, and results of the above procedure should only be interpreted and judged by a Board-Certified Interventional Pain Specialist with extensive familiarity and expertise in the same exact procedure and technique.

## 2018-09-08 NOTE — Patient Instructions (Addendum)

## 2018-09-08 NOTE — Progress Notes (Signed)
Safety precautions to be maintained throughout the outpatient stay will include: orient to surroundings, keep bed in low position, maintain call bell within reach at all times, provide assistance with transfer out of bed and ambulation.  

## 2018-09-09 ENCOUNTER — Telehealth: Payer: Self-pay | Admitting: *Deleted

## 2018-09-09 NOTE — Telephone Encounter (Signed)
No problems post procedure. 

## 2018-10-03 ENCOUNTER — Ambulatory Visit: Payer: BLUE CROSS/BLUE SHIELD | Attending: Pain Medicine | Admitting: Pain Medicine

## 2018-10-03 ENCOUNTER — Other Ambulatory Visit: Payer: Self-pay

## 2018-10-03 DIAGNOSIS — G894 Chronic pain syndrome: Secondary | ICD-10-CM | POA: Diagnosis not present

## 2018-10-03 DIAGNOSIS — M961 Postlaminectomy syndrome, not elsewhere classified: Secondary | ICD-10-CM

## 2018-10-03 DIAGNOSIS — M5442 Lumbago with sciatica, left side: Secondary | ICD-10-CM | POA: Diagnosis not present

## 2018-10-03 DIAGNOSIS — G8929 Other chronic pain: Secondary | ICD-10-CM

## 2018-10-03 DIAGNOSIS — M5441 Lumbago with sciatica, right side: Secondary | ICD-10-CM | POA: Diagnosis not present

## 2018-10-03 NOTE — Progress Notes (Signed)
Pain Management Encounter Note - Virtual Visit via Telephone Telehealth (real-time audio visits between healthcare provider and patient).  Patient's Phone No.:  [phone] (home); [phone] (mobile); (Preferred) [phone]  Pre-screening note:  Our staff contacted Derek [name] and offered him an "in person", "face-to-face" appointment versus a telephone encounter. He indicated preferring the telephone encounter, at this time.  Reason for Virtual Visit: COVID-19*  Social distancing based on CDC ans AMA recommendations.   I contacted Derek [name] on 10/03/2018 at 11:16 AM by telephone and clearly identified myself as Oswaldo Done, MD. I verified that I was speaking with the correct person using two identifiers (Name and date of birth: 05-Mar-1981).  Advanced Informed Consent I sought verbal advanced consent from Derek [name] for telemedicine interactions and virtual visit. I informed Derek [name] of the security and privacy concerns, risks, and limitations associated with performing an evaluation and management service by telephone. I also informed Derek [name] of the availability of "in person" appointments and I informed him of the possibility of a patient responsible charge related to this service. Derek [name] expressed understanding and agreed to proceed.   Historic Elements   Derek [name] is a 38 y.o. year old, male patient evaluated today after his last encounter by our practice on 09/09/2018. Derek [name]  has a past medical history of Anxiety, Arthritis, and GERD (gastroesophageal reflux disease). He also  has a past surgical history that includes Back surgery; HAND REIMPLANTED; and Lumbar fusion (11/14). Derek [name] has a current medication list which includes the following prescription(s): gnp calcium 1200, vitamin d3, cyclobenzaprine, magnesium, oxycodone hcl, oxycodone hcl, and tizanidine. He  reports that he has been smoking. He has a 16.00 pack-year smoking history. He has never used  smokeless tobacco. He reports current alcohol use. He reports that he does not use drugs. Derek [name] is allergic to amoxicillin and vicodin [hydrocodone-acetaminophen].   HPI  I last saw him on 09/08/2018. He is being evaluated for a post-procedure assessment.  Post-Procedure Evaluation  Procedure: Therapeutic Racz procedure #2 under fluoroscopic guidance and IV sedation Pre-procedure pain level:  3/10 Post-procedure: 1/10          Sedation: Please see nurses note.  Effectiveness during initial hour after procedure(Ultra-Short Term Relief): 100 %  Local anesthetic used: Long-acting (4-6 hours) Effectiveness: Defined as any analgesic benefit obtained secondary to the administration of local anesthetics. This carries significant diagnostic value as to the etiological location, or anatomical origin, of the pain. Duration of benefit is expected to coincide with the duration of the local anesthetic used.  Effectiveness during initial 4-6 hours after procedure(Short-Term Relief): 100 %  Long-term benefit: Defined as any relief past the pharmacologic duration of the local anesthetics.  Effectiveness past the initial 6 hours after procedure(Long-Term Relief): 100 %  Current benefits: Defined as benefit that persist at this time.   Analgesia:  100%.  The patient indicates that he is currently having absolutely no pain in the leg after the procedure. Function: Derek [name] reports improvement in function ROM: Derek [name] reports improvement in ROM  Pharmacotherapy Assessment  Analgesic: Analgesic:OxycodoneIR10 mg oxycodone q6hrs (40 mg/day) (last filled on 09/24/2018).  He indicates that he is on his last prescription. MME/day:60mg /day  Monitoring: Pharmacotherapy: No side-effects or adverse reactions reported. [place] PMP: PDMP reviewed during this encounter.       Compliance: No problems identified. Plan: Refer to "POC".  Review of recent tests  DG C-Arm 1-60 Min-No Report Fluoroscopy was  utilized  by the requesting physician.  No radiographic  interpretation.    Office Visit on 04/21/2018  Component Date Value Ref Range Status  . Summary 04/21/2018 FINAL   Final   Comment: ==================================================================== TOXASSURE SELECT 13 (MW) ==================================================================== Test                             Result       Flag       Units Drug Present and Declared for Prescription Verification   Oxycodone                      1156         EXPECTED   ng/mg creat   Oxymorphone                    3549         EXPECTED   ng/mg creat   Noroxycodone                   1392         EXPECTED   ng/mg creat   Noroxymorphone                 352          EXPECTED   ng/mg creat    Sources of oxycodone are scheduled prescription medications.    Oxymorphone, noroxycodone, and noroxymorphone are expected    metabolites of oxycodone. Oxymorphone is also available as a    scheduled prescription medication. Drug Absent but Declared for Prescription Verification   Hydrocodone                    Not Detected UNEXPECTED ng/mg creat ==================================================================== Test                                                Result    Flag   Units      Ref Range   Creatinine              154              mg/dL      >=16 ==================================================================== Declared Medications:  The flagging and interpretation on this report are based on the  following declared medications.  Unexpected results may arise from  inaccuracies in the declared medications.  **Note: The testing scope of this panel includes these medications:  Hydrocodone (Vicodin)  Oxycodone (Percocet)  **Note: The testing scope of this panel does not include following  reported medications:  Acetaminophen (Percocet)  Acetaminophen (Vicodin)  Calcium carbonate (Calcium Carb/Vitamin D)  Cyclobenzaprine  (Flexeril)  Magnesium (Mag-Ox)  Tizanidine  Vitamin D (Calcium Carb/Vitamin D)  Vitamin D3 ==================================================================== For clinical consultation, please call [phone]. =============================================================                          =======    Assessment  The encounter diagnosis was Chronic pain syndrome.  Plan of Care  I discussed the assessment and treatment plan with the patient. The patient was provided an opportunity to ask questions and all were answered. The patient agreed with the plan and demonstrated an understanding of the instructions.  Patient advised to call back or seek an in-person evaluation if the  symptoms or condition worsens.  I am having Derek [name] maintain his cyclobenzaprine, tiZANidine, GNP Calcium 1200, Vitamin D3, Magnesium, Oxycodone HCl, and Oxycodone HCl. Pharmacotherapy (Medications Ordered): No orders of the defined types were placed in this encounter.  Orders:  No orders of the defined types were placed in this encounter.  Follow-up plan:   Return for Med-Mgmt w/ NP.    Total duration of non-face-to-face encounter: 12 minutes.  Note by: Oswaldo Done, MD Date: 10/03/2018; Time: 11:16 AM  Disclaimer:  * Given the special circumstances of the COVID-19 pandemic, the federal government has announced that the Office for Civil Rights (OCR) will exercise its enforcement discretion and will not impose penalties on physicians using telehealth in the event of noncompliance with regulatory requirements under the [employer] Portability and Accountability Act (HIPAA) in connection with the good faith provision of telehealth during the COVID-19 national public health emergency. (AMA)

## 2018-10-18 ENCOUNTER — Other Ambulatory Visit: Payer: Self-pay

## 2018-10-18 ENCOUNTER — Ambulatory Visit: Payer: BLUE CROSS/BLUE SHIELD | Attending: Nurse Practitioner | Admitting: Nurse Practitioner

## 2018-10-18 DIAGNOSIS — M47817 Spondylosis without myelopathy or radiculopathy, lumbosacral region: Secondary | ICD-10-CM

## 2018-10-18 DIAGNOSIS — M5442 Lumbago with sciatica, left side: Secondary | ICD-10-CM

## 2018-10-18 DIAGNOSIS — G894 Chronic pain syndrome: Secondary | ICD-10-CM

## 2018-10-18 DIAGNOSIS — G8929 Other chronic pain: Secondary | ICD-10-CM

## 2018-10-18 DIAGNOSIS — M7918 Myalgia, other site: Secondary | ICD-10-CM

## 2018-10-18 DIAGNOSIS — E559 Vitamin D deficiency, unspecified: Secondary | ICD-10-CM

## 2018-10-18 DIAGNOSIS — M62830 Muscle spasm of back: Secondary | ICD-10-CM

## 2018-10-18 DIAGNOSIS — M5441 Lumbago with sciatica, right side: Secondary | ICD-10-CM

## 2018-10-18 DIAGNOSIS — M533 Sacrococcygeal disorders, not elsewhere classified: Secondary | ICD-10-CM

## 2018-10-18 NOTE — Progress Notes (Signed)
Pain Management Encounter Note - Virtual Visit via Telephone Telehealth (real-time audio visits between healthcare provider and patient).  Patient's Phone No. & Preferred Pharmacy:  [phone] (home); [phone] (mobile); (Preferred) [phone]  [facility] - [place], [place] - [address] [place] [address] Phone: [phone] Fax: [phone]   Pre-screening note:  Our staff contacted [name] and offered him an "in person", "face-to-face" appointment versus a telephone encounter. He indicated preferring the telephone encounter, at this time.  Reason for Virtual Visit: COVID-19*  Social distancing based on CDC and AMA recommendations.   I contacted Derek [name] on 10/18/2018 at 8:00 AM by telephone and clearly identified myself as Thad Ranger, NP. I verified that I was speaking with the correct person using two identifiers (Name and date of birth: May 15, 1981).  Advanced Informed Consent I sought verbal advanced consent from Derek [name] for telemedicine interactions and virtual visit. I informed [name] of the security and privacy concerns, risks, and limitations associated with performing an evaluation and management service by telephone. I also informed [name] of the availability of "in person" appointments and I informed him of the possibility of a patient responsible charge related to this service. [name] expressed understanding and agreed to proceed.   Historic Elements   [name] is a 38 y.o. year old, male patient evaluated today after his last encounter by our practice on 10/03/2018. [name]  has a past medical history of Anxiety, Arthritis, and GERD (gastroesophageal reflux disease). He also  has a past surgical history that includes Back surgery; HAND REIMPLANTED; and Lumbar fusion (11/14). [name] has a current medication list which includes the following prescription(s): gnp calcium 1200, vitamin d3, cyclobenzaprine,  magnesium, oxycodone hcl, oxycodone hcl, and tizanidine. He  reports that he has been smoking. He has a 16.00 pack-year smoking history. He has never used smokeless tobacco. He reports current alcohol use. He reports that he does not use drugs. [name] is allergic to amoxicillin and vicodin [hydrocodone-acetaminophen].   HPI  I last saw him on 08/22/2018. He is being evaluated for medication management. He admits that he is having 2/10 low back pain. He feels like this maybe related to helping his son moves some equipment. He has increased pain with standing for long periods. His back pain is worse on the left however it hurts all the way across. He is SP a Racz procedure. He admits that he has improvement in his leg pain. He does continue to work fulltime. He denies any side effects of his medication. His only concern is when he will be able to get another injection.  Pharmacotherapy Assessment  OxycodoneIR10 mg oxycodone q6hrs (40 mg/day) MME/day:60mg /day  Monitoring: Pharmacotherapy: No side-effects or adverse reactions reported. [place] PMP: PDMP reviewed during this encounter.       Compliance: No problems identified. Plan: Refer to "POC".  Review of recent tests  DG C-Arm 1-60 Min-No Report Fluoroscopy was utilized by the requesting physician.  No radiographic  interpretation.    Office Visit on 04/21/2018  Component Date Value Ref Range Status  . Summary 04/21/2018 FINAL   Final   Comment: ==================================================================== TOXASSURE SELECT 13 (MW) ==================================================================== Test                             Result       Flag       Units  Drug Present and Declared for Prescription Verification   Oxycodone                      1156         EXPECTED   ng/mg creat   Oxymorphone                    3549         EXPECTED   ng/mg creat   Noroxycodone                   1392         EXPECTED   ng/mg creat    Noroxymorphone                 352          EXPECTED   ng/mg creat    Sources of oxycodone are scheduled prescription medications.    Oxymorphone, noroxycodone, and noroxymorphone are expected    metabolites of oxycodone. Oxymorphone is also available as a    scheduled prescription medication. Drug Absent but Declared for Prescription Verification   Hydrocodone                    Not Detected UNEXPECTED ng/mg creat ==================================================================== Test                                                Result    Flag   Units      Ref Range   Creatinine              154              mg/dL      >=40>=20 ==================================================================== Declared Medications:  The flagging and interpretation on this report are based on the  following declared medications.  Unexpected results may arise from  inaccuracies in the declared medications.  **Note: The testing scope of this panel includes these medications:  Hydrocodone (Vicodin)  Oxycodone (Percocet)  **Note: The testing scope of this panel does not include following  reported medications:  Acetaminophen (Percocet)  Acetaminophen (Vicodin)  Calcium carbonate (Calcium Carb/Vitamin D)  Cyclobenzaprine (Flexeril)  Magnesium (Mag-Ox)  Tizanidine  Vitamin D (Calcium Carb/Vitamin D)  Vitamin D3 ==================================================================== For clinical consultation, please call [phone]. 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 Diagnoses of Chronic musculoskeletal pain, Spasm of back muscles, Chronic pain syndrome, Chronic low back pain (Primary Area of Pain) (Bilateral) (L>R) w/ sciatica (Bilateral), Vitamin D insufficiency, and Sacroiliac joint dysfunction (Bilateral) were also  pertinent to this visit.  Plan of Care  I am having Derek PoreJeffrey M. Huhn maintain his cyclobenzaprine, Oxycodone HCl, Oxycodone HCl, tiZANidine, Vitamin D3, Magnesium, and GNP Calcium 1200.  Pharmacotherapy (Medications Ordered): Meds ordered this encounter  Medications  . cyclobenzaprine (FLEXERIL) 10 MG tablet    Sig: Take 1 tablet (10 mg total) by mouth at bedtime.    Dispense:  30 tablet    Refill:  2    Do not place medication on "Automatic Refill". Fill one day early if pharmacy is closed on scheduled refill date.  Order Specific Question:   Supervising Provider    Answer:   Delano Metz [phone]  . Oxycodone HCl 10 MG TABS    Sig: Take 1 tablet (10 mg total) by mouth every 6 (six) hours as needed for up to 30 days (Take only if needed, not around-the-clock.).    Dispense:  120 tablet    Refill:  0    Do not place this medication, or any other prescription from our practice, on "Automatic Refill". Patient may have prescription filled one day early if pharmacy is closed on scheduled refill date.    Order Specific Question:   Supervising Provider    Answer:   Delano Metz [phone]  . Oxycodone HCl 10 MG TABS    Sig: Take 1 tablet (10 mg total) by mouth every 6 (six) hours as needed for up to 30 days.    Dispense:  120 tablet    Refill:  0    Do not place this medication, or any other prescription from our practice, on "Automatic Refill". Patient may have prescription filled one day early if pharmacy is closed on scheduled refill date.    Order Specific Question:   Supervising Provider    Answer:   Delano Metz [phone]  . tiZANidine (ZANAFLEX) 4 MG tablet    Sig: Take 1 tablet (4 mg total) by mouth 2 (two) times daily as needed for muscle spasms (For muscle pain/spasm during the day.).    Dispense:  60 tablet    Refill:  2    Do not place medication on "Automatic Refill". Fill one day early if pharmacy is closed on scheduled refill date.    Order Specific  Question:   Supervising Provider    Answer:   Delano Metz [phone]  . Cholecalciferol (VITAMIN D3) 125 MCG (5000 UT) CAPS    Sig: Take 1 capsule (5,000 Units total) by mouth daily with breakfast. Take along with calcium and magnesium.    Dispense:  90 capsule    Refill:  0    Do not place medication on "Automatic Refill".  May substitute with similar over-the-counter product.    Order Specific Question:   Supervising Provider    Answer:   Delano Metz [phone]  . Magnesium 500 MG CAPS    Sig: Take 1 capsule (500 mg total) by mouth 2 (two) times daily at 8 am and 10 pm.    Dispense:  180 capsule    Refill:  0    Do not place medication on "Automatic Refill".  The patient may use similar over-the-counter product.    Order Specific Question:   Supervising Provider    Answer:   Delano Metz [phone]  . Calcium Carbonate-Vit D-Min (GNP CALCIUM 1200) 1200-1000 MG-UNIT CHEW    Sig: Chew 1,200 mg by mouth daily with breakfast. Take in combination with vitamin D and magnesium.    Dispense:  90 tablet    Refill:  0    Do not place medication on "Automatic Refill".  May substitute with similar over-the-counter product.    Order Specific Question:   Supervising Provider    Answer:   Delano Metz [phone]   Orders:  No orders of the defined types were placed in this encounter.  Follow-up plan:   Return in about 2 months (around 12/18/2018) for MedMgmt.   I discussed the assessment and treatment plan with the patient. The patient was provided an opportunity to ask questions and all were answered. The patient agreed with the  plan and demonstrated an understanding of the instructions.  Patient advised to call back or seek an in-person evaluation if the symptoms or condition worsens.  Total duration of non-face-to-face encounter: 12 minutes.  Note by: Thad Ranger, NP Date: 10/18/2018; Time: 12:22 PM  Disclaimer:  * Given the special circumstances of the COVID-19  pandemic, the federal government has announced that the Office for Civil Rights (OCR) will exercise its enforcement discretion and will not impose penalties on physicians using telehealth in the event of noncompliance with regulatory requirements under the [employer] Portability and Accountability Act (HIPAA) in connection with the good faith provision of telehealth during the COVID-19 national public health emergency. (AMA)

## 2018-10-19 MED ORDER — OXYCODONE HCL 10 MG PO TABS: 10.0000 mg | ORAL_TABLET | Freq: Four times a day (QID) | ORAL | 0 refills | Status: DC | PRN

## 2018-10-19 MED ORDER — VITAMIN D3 125 MCG (5000 UT) PO CAPS: 1.0000 | ORAL_CAPSULE | Freq: Every day | ORAL | 0 refills | Status: AC

## 2018-10-19 MED ORDER — GNP CALCIUM 1200 1200-1000 MG-UNIT PO CHEW: 1200.0000 mg | CHEWABLE_TABLET | Freq: Every day | ORAL | 0 refills | Status: DC

## 2018-10-19 MED ORDER — CYCLOBENZAPRINE HCL 10 MG PO TABS: 10.0000 mg | ORAL_TABLET | Freq: Every day | ORAL | 2 refills | Status: DC

## 2018-10-19 MED ORDER — MAGNESIUM 500 MG PO CAPS: 500.0000 mg | ORAL_CAPSULE | Freq: Two times a day (BID) | ORAL | 0 refills | Status: AC

## 2018-10-19 MED ORDER — TIZANIDINE HCL 4 MG PO TABS: 4.0000 mg | ORAL_TABLET | Freq: Two times a day (BID) | ORAL | 2 refills | Status: DC | PRN

## 2018-12-26 ENCOUNTER — Telehealth: Payer: Self-pay

## 2018-12-26 NOTE — Telephone Encounter (Signed)
Patients appointment in April he was given a script for April and May.  He was to run out 12-23-2018.  Please schedule him an appointment for a med refill.  Thank yiou

## 2018-12-26 NOTE — Telephone Encounter (Signed)
His pharmacy said they never got the script for June. Crystal was supposed to send it in for three months with his next appt in July. They say they didn't get Junes. Should I reschedule his July appt for June?

## 2018-12-27 ENCOUNTER — Encounter: Payer: Self-pay | Admitting: Pain Medicine

## 2018-12-28 ENCOUNTER — Ambulatory Visit: Payer: BC Managed Care – PPO | Attending: Nurse Practitioner | Admitting: Pain Medicine

## 2018-12-28 ENCOUNTER — Other Ambulatory Visit: Payer: Self-pay

## 2018-12-28 DIAGNOSIS — M79604 Pain in right leg: Secondary | ICD-10-CM | POA: Diagnosis not present

## 2018-12-28 DIAGNOSIS — Z789 Other specified health status: Secondary | ICD-10-CM

## 2018-12-28 DIAGNOSIS — M899 Disorder of bone, unspecified: Secondary | ICD-10-CM

## 2018-12-28 DIAGNOSIS — M62830 Muscle spasm of back: Secondary | ICD-10-CM

## 2018-12-28 DIAGNOSIS — M79605 Pain in left leg: Secondary | ICD-10-CM

## 2018-12-28 DIAGNOSIS — M5442 Lumbago with sciatica, left side: Secondary | ICD-10-CM

## 2018-12-28 DIAGNOSIS — M961 Postlaminectomy syndrome, not elsewhere classified: Secondary | ICD-10-CM

## 2018-12-28 DIAGNOSIS — M960 Pseudarthrosis after fusion or arthrodesis: Secondary | ICD-10-CM

## 2018-12-28 DIAGNOSIS — Z79899 Other long term (current) drug therapy: Secondary | ICD-10-CM

## 2018-12-28 DIAGNOSIS — G9619 Other disorders of meninges, not elsewhere classified: Secondary | ICD-10-CM

## 2018-12-28 DIAGNOSIS — G894 Chronic pain syndrome: Secondary | ICD-10-CM

## 2018-12-28 DIAGNOSIS — R7982 Elevated C-reactive protein (CRP): Secondary | ICD-10-CM

## 2018-12-28 DIAGNOSIS — G96198 Other disorders of meninges, not elsewhere classified: Secondary | ICD-10-CM

## 2018-12-28 DIAGNOSIS — M5441 Lumbago with sciatica, right side: Secondary | ICD-10-CM

## 2018-12-28 DIAGNOSIS — M7918 Myalgia, other site: Secondary | ICD-10-CM

## 2018-12-28 DIAGNOSIS — E559 Vitamin D deficiency, unspecified: Secondary | ICD-10-CM

## 2018-12-28 DIAGNOSIS — G8929 Other chronic pain: Secondary | ICD-10-CM

## 2018-12-28 MED ORDER — CYCLOBENZAPRINE HCL 10 MG PO TABS: 10.0000 mg | ORAL_TABLET | Freq: Every day | ORAL | 0 refills | Status: DC

## 2018-12-28 MED ORDER — TIZANIDINE HCL 4 MG PO TABS: 4.0000 mg | ORAL_TABLET | Freq: Two times a day (BID) | ORAL | 0 refills | Status: DC | PRN

## 2018-12-28 MED ORDER — OXYCODONE HCL 10 MG PO TABS: 10.0000 mg | ORAL_TABLET | Freq: Four times a day (QID) | ORAL | 0 refills | Status: DC | PRN

## 2018-12-28 NOTE — Progress Notes (Signed)
Pain Management Virtual Encounter Note - Virtual Visit via Telephone Telehealth (real-time audio visits between healthcare provider and patient).   Patient's Phone No. & Preferred Pharmacy:  [phone] (home); [phone] (mobile); (Preferred) [phone] [email or web address]  [facility] - [place], [place] - [address] [place] [address] Phone: [phone] Fax: [phone]    Pre-screening note:  Our staff contacted [name] and offered him an "in person", "face-to-face" appointment versus a telephone encounter. He indicated preferring the telephone encounter, at this time.   Reason for Virtual Visit: COVID-19*  Social distancing based on CDC and AMA recommendations.   I contacted Donald PoreJeffrey M Griebel on 12/28/2018 via telephone.      I clearly identified myself as Oswaldo DoneFrancisco A Ayumi Wangerin, MD. I verified that I was speaking with the correct person using two identifiers (Name: Donald PoreJeffrey M Burback, and date of birth: 11/17/1980).  Advanced Informed Consent I sought verbal advanced consent from Donald PoreJeffrey M Horsford for virtual visit interactions. I informed [name] of possible security and privacy concerns, risks, and limitations associated with providing "not-in-person" medical evaluation and management services. I also informed [name] of the availability of "in-person" appointments. Finally, I informed him that there would be a charge for the virtual visit and that he could be  personally, fully or partially, financially responsible for it. [name] expressed understanding and agreed to proceed.   Historic Elements   Mr. Donald PoreJeffrey M Hanak is a 38 y.o. year old, male patient evaluated today after his last encounter by our practice on 12/26/2018. [name]  has a past medical history of Anxiety, Arthritis, and GERD (gastroesophageal reflux disease). He also  has a past surgical history that includes Back surgery; HAND REIMPLANTED; and Lumbar fusion (11/14). [name]  has a current medication list which includes the following prescription(s): gnp calcium 1200, vitamin d3, cyclobenzaprine, magnesium, oxycodone hcl, oxycodone hcl, oxycodone hcl, and tizanidine. He  reports that he has been smoking. He has a 16.00 pack-year smoking history. He has never used smokeless tobacco. He reports current alcohol use. He reports that he does not use drugs. [name] is allergic to amoxicillin and vicodin [hydrocodone-acetaminophen].   HPI  Today, he is being contacted for medication management.  Pharmacotherapy Assessment  Analgesic: OxycodoneIR10 mg oxycodone q6hrs (40 mg/day) MME/day:60mg /day  Monitoring: Pharmacotherapy: No side-effects or adverse reactions reported. [place] PMP: PDMP reviewed during this encounter.       Compliance: No problems identified. Effectiveness: Clinically acceptable. Plan: Refer to "POC".  Pertinent Labs   SAFETY SCREENING Profile Lab Results  Component Value Date   STAPHAUREUS NEGATIVE 02/12/2015   MRSAPCR NEGATIVE 02/12/2015   Renal Function Lab Results  Component Value Date   BUN 8 01/24/2018   CREATININE 1.02 01/24/2018   BCR 8 (L) 01/24/2018   GFRAA 107 01/24/2018   GFRNONAA 93 01/24/2018   Hepatic Function Lab Results  Component Value Date   AST 22 01/24/2018   ALBUMIN 4.4 01/24/2018   UDS Summary  Date Value Ref Range Status  04/21/2018 FINAL  Final    Comment:    ==================================================================== TOXASSURE SELECT 13 (MW) ==================================================================== Test                             Result       Flag       Units Drug Present and Declared for Prescription Verification   Oxycodone  1156         EXPECTED   ng/mg creat   Oxymorphone                    3549         EXPECTED   ng/mg creat   Noroxycodone                   1392         EXPECTED   ng/mg creat   Noroxymorphone                 352          EXPECTED   ng/mg  creat    Sources of oxycodone are scheduled prescription medications.    Oxymorphone, noroxycodone, and noroxymorphone are expected    metabolites of oxycodone. Oxymorphone is also available as a    scheduled prescription medication. Drug Absent but Declared for Prescription Verification   Hydrocodone                    Not Detected UNEXPECTED ng/mg creat ==================================================================== Test                      Result    Flag   Units      Ref Range   Creatinine              154              mg/dL      >=16>=20 ==================================================================== Declared Medications:  The flagging and interpretation on this report are based on the  following declared medications.  Unexpected results may arise from  inaccuracies in the declared medications.  **Note: The testing scope of this panel includes these medications:  Hydrocodone (Vicodin)  Oxycodone (Percocet)  **Note: The testing scope of this panel does not include following  reported medications:  Acetaminophen (Percocet)  Acetaminophen (Vicodin)  Calcium carbonate (Calcium Carb/Vitamin D)  Cyclobenzaprine (Flexeril)  Magnesium (Mag-Ox)  Tizanidine  Vitamin D (Calcium Carb/Vitamin D)  Vitamin D3 ==================================================================== For clinical consultation, please call [phone]. ====================================================================    Note: Above Lab results reviewed.  Recent imaging  DG C-Arm 1-60 Min-No Report Fluoroscopy was utilized by the requesting physician.  No radiographic  interpretation.   Assessment  The primary encounter diagnosis was Chronic pain syndrome. Diagnoses of Chronic low back pain (Primary Area of Pain) (Bilateral) (L>R) w/ sciatica (Bilateral), Chronic lower extremity pain (Secondary Area of Pain) (Bilateral) (L>R), Failed back surgical syndrome (x3), Epidural fibrosis, L5-S1  pseudoarthrosis, Chronic musculoskeletal pain, Spasm of back muscles, Pharmacologic therapy, Disorder of skeletal system, Problems influencing health status, Vitamin D insufficiency, and Elevated C-reactive protein (CRP) were also pertinent to this visit.  Plan of Care  I have discontinued Farris HasJeffrey M. Billing's Oxycodone HCl. I have also changed his Oxycodone HCl, tiZANidine, and cyclobenzaprine. Additionally, I am having him start on Oxycodone HCl and Oxycodone HCl. Lastly, I am having him maintain his Vitamin D3, Magnesium, and GNP Calcium 1200.  Pharmacotherapy (Medications Ordered): Meds ordered this encounter  Medications  . Oxycodone HCl 10 MG TABS    Sig: Take 1 tablet (10 mg total) by mouth every 6 (six) hours as needed for up to 30 days (Only if needed, not around-the-clock). Must last 30 days    Dispense:  120 tablet    Refill:  0    Chronic Pain: STOP Act (Not applicable) Fill 1 day early if closed  on refill date. Do not fill until: 12/28/2018. To last until: 01/27/2019. Avoid benzodiazepines within 8 hours of opioids  . Oxycodone HCl 10 MG TABS    Sig: Take 1 tablet (10 mg total) by mouth every 6 (six) hours as needed for up to 30 days (Only if needed, not around-the-clock). Must last 30 days    Dispense:  120 tablet    Refill:  0    Chronic Pain: STOP Act (Not applicable) Fill 1 day early if closed on refill date. Do not fill until: 01/27/2019. To last until: 02/26/2019. Avoid benzodiazepines within 8 hours of opioids  . tiZANidine (ZANAFLEX) 4 MG tablet    Sig: Take 1 tablet (4 mg total) by mouth 2 (two) times daily as needed for muscle spasms (For day-time muscle pain/spasm). Must last 60 days    Dispense:  120 tablet    Refill:  0    Fill one day early if pharmacy is closed on scheduled refill date. May substitute for generic if available.  . cyclobenzaprine (FLEXERIL) 10 MG tablet    Sig: Take 1 tablet (10 mg total) by mouth at bedtime. Must last 60 days    Dispense:  90 tablet     Refill:  0    Fill one day early if pharmacy is closed on scheduled refill date. May substitute for generic if available.  . Oxycodone HCl 10 MG TABS    Sig: Take 1 tablet (10 mg total) by mouth every 6 (six) hours as needed for up to 30 days (Only if needed, not around-the-clock). Must last 30 days    Dispense:  120 tablet    Refill:  0    Chronic Pain: STOP Act (Not applicable) Fill 1 day early if closed on refill date. Do not fill until: 02/26/2019. To last until: 03/28/2019. Avoid benzodiazepines within 8 hours of opioids   Orders:  Orders Placed This Encounter  Procedures  . Racz (One Day)    Standing Status:   Standing    Number of Occurrences:   6    Standing Expiration Date:   06/28/2020    Scheduling Instructions:     Side: Midline     Sedation: with sedation     Scheduling Timeframe: The patient will call to schedule (PRN)  . ToxASSURE Select 13 (MW), Urine    Volume: 30 ml(s). Minimum 3 ml of urine is needed. Document temperature of fresh sample. Indications: Long term (current) use of opiate analgesic (Z79.891)  . Comp. Metabolic Panel (12)    With GFR. Indications: Chronic Pain Syndrome (G89.4) & Pharmacotherapy (Z61.096(Z79.899)    Order Specific Question:   Has the patient fasted?    Answer:   No    Order Specific Question:   CC Results    Answer:   PCP-NURSE [[id number]]  . Magnesium    Indication: Pharmacologic therapy (E45.409(Z79.899)    Order Specific Question:   CC Results    Answer:   PCP-NURSE [[id number]][[id number]]  . Vitamin B12    Indication: Pharmacologic therapy (N82.956(Z79.899).    Order Specific Question:   CC Results    Answer:   PCP-NURSE [[id number]]  . Sedimentation rate    Indication: Disorder of skeletal system (M89.9)    Order Specific Question:   CC Results    Answer:   PCP-NURSE [[id number]]  . 25-Hydroxyvitamin D Lcms D2+D3    Indication: Disorder of skeletal system (M89.9).    Order Specific Question:   CC Results    Answer:  PCP-NURSE [id number]  . C-reactive protein     Indication: Problems influencing health status (Z78.9)    Order Specific Question:   CC Results    Answer:   PCP-NURSE [[id number]]   Follow-up plan:   Return in 3 months (on 03/20/2019) for (VV), E/M (MM), in addition, PRN Procedure (w/ sedation): RACZ procedure.    Recent Visits Date Type Provider Dept  10/18/18 Office Visit Vevelyn Francois, NP [facility]  10/03/18 Office Visit Milinda Pointer, MD [facility]  Showing recent visits within past 90 days and meeting all other requirements   Today's Visits Date Type Provider Dept  12/28/18 Office Visit Milinda Pointer, MD [facility]  Showing today's visits and meeting all other requirements   Future Appointments No visits were found meeting these conditions.  Showing future appointments within next 90 days and meeting all other requirements   I discussed the assessment and treatment plan with the patient. The patient was provided an opportunity to ask questions and all were answered. The patient agreed with the plan and demonstrated an understanding of the instructions.  Patient advised to call back or seek an in-person evaluation if the symptoms or condition worsens.  Total duration of non-face-to-face encounter: 15 minutes.   Note by: Gaspar Cola, MD Date: 12/28/2018; Time: 12:01 PM  Note: This dictation was prepared with Dragon dictation. Any transcriptional errors that may result from this process are unintentional.  Disclaimer:  * Given the special circumstances of the COVID-19 pandemic, the federal government has announced that the Office for Civil Rights (OCR) will exercise its enforcement discretion and will not impose penalties on physicians using telehealth in the event of noncompliance with regulatory requirements under the [place] (HIPAA) in connection with the good faith provision of telehealth during the KDXIP-38 national public health  emergency. ([place])

## 2018-12-28 NOTE — Patient Instructions (Signed)
____________________________________________________________________________________________  Medication Recommendations and Reminders  Applies to: All patients receiving prescriptions (written and/or electronic).  Medication Rules & Regulations: These rules and regulations exist for your safety and that of others. They are not flexible and neither are we. Dismissing or ignoring them will be considered "non-compliance" with medication therapy, resulting in complete and irreversible termination of such therapy. (See document titled "Medication Rules" for more details.) In all conscience, because of safety reasons, we cannot continue providing a therapy where the patient does not follow instructions.  Pharmacy of record:   Definition: This is the pharmacy where your electronic prescriptions will be sent.   We do not endorse any particular pharmacy.  You are not restricted in your choice of pharmacy.  The pharmacy listed in the electronic medical record should be the one where you want electronic prescriptions to be sent.  If you choose to change pharmacy, simply notify our nursing staff of your choice of new pharmacy.  Recommendations:  Keep all of your pain medications in a safe place, under lock and key, even if you live alone.   After you fill your prescription, take 1 week's worth of pills and put them away in a safe place. You should keep a separate, properly labeled bottle for this purpose. The remainder should be kept in the original bottle. Use this as your primary supply, until it runs out. Once it's gone, then you know that you have 1 week's worth of medicine, and it is time to come in for a prescription refill. If you do this correctly, it is unlikely that you will ever run out of medicine.  To make sure that the above recommendation works, it is very important that you make sure your medication refill appointments are scheduled at least 1 week before you run out of medicine. To do  this in an effective manner, make sure that you do not leave the office without scheduling your next medication management appointment. Always ask the nursing staff to show you in your prescription , when your medication will be running out. Then arrange for the receptionist to get you a return appointment, at least 7 days before you run out of medicine. Do not wait until you have 1 or 2 pills left, to come in. This is very poor planning and does not take into consideration that we may need to cancel appointments due to bad weather, sickness, or emergencies affecting our staff.  "Partial Fill": If for any reason your pharmacy does not have enough pills/tablets to completely fill or refill your prescription, do not allow for a "partial fill". You will need a separate prescription to fill the remaining amount, which we will not provide. If the reason for the partial fill is your insurance, you will need to talk to the pharmacist about payment alternatives for the remaining tablets, but again, do not accept a partial fill.  Prescription refills and/or changes in medication(s):   Prescription refills, and/or changes in dose or medication, will be conducted only during scheduled medication management appointments. (Applies to both, written and electronic prescriptions.)  No refills on procedure days. No medication will be changed or started on procedure days. No changes, adjustments, and/or refills will be conducted on a procedure day. Doing so will interfere with the diagnostic portion of the procedure.  No phone refills. No medications will be "called into the pharmacy".  No Fax refills.  No weekend refills.  No Holliday refills.  No after hours refills.  Remember:  Business   hours are:  Monday to Thursday 8:00 AM to 4:00 PM Provider's Schedule: Crystal King, NP - Appointments are:  Medication management: Monday to Thursday 8:00 AM to 4:00 PM Irelyn Perfecto, MD - Appointments are:   Medication management: Monday and Wednesday 8:00 AM to 4:00 PM Procedure day: Tuesday and Thursday 7:30 AM to 4:00 PM Bilal Lateef, MD - Appointments are:  Medication management: Tuesday and Thursday 8:00 AM to 4:00 PM Procedure day: Monday and Wednesday 7:30 AM to 4:00 PM (Last update: 09/02/2017) ____________________________________________________________________________________________    

## 2019-01-17 ENCOUNTER — Encounter: Payer: BLUE CROSS/BLUE SHIELD | Admitting: Nurse Practitioner

## 2019-01-18 ENCOUNTER — Encounter: Payer: BLUE CROSS/BLUE SHIELD | Admitting: Pain Medicine

## 2019-01-18 LAB — TOXASSURE SELECT 13 (MW), URINE

## 2019-01-19 ENCOUNTER — Encounter: Payer: Self-pay | Admitting: Pain Medicine

## 2019-01-19 NOTE — Progress Notes (Signed)
Test: ESR (Erythrocyte Sedimentation Rate) levels Finding(s): Elevated  Normal Level(s): below 30 mm/hr. Clinical significance: The sed rate is an acute phase reactant that indirectly measures the degree of inflammation present in the body. It can be acute, developing rapidly after trauma, injury or infection, for example, or can occur over an extended time (chronic) with conditions such as autoimmune diseases or cancer. The ESR is not diagnostic; it is a non-specific, screening test that may be elevated in a number of these different conditions. It provides general information about the presence or absence of an inflammatory condition. Signs and symptoms may include: None Possible causes:  - Most common: inflammation Patient Recommendation(s): Unless contraindicated, consider the use of anti-inflammatory diet and medications. (visit http://inflammationfactor.com/ ) ___________________________________________________________________________________ 

## 2019-01-20 LAB — COMP. METABOLIC PANEL (12)
AST: 24 IU/L (ref 0–40)
Albumin/Globulin Ratio: 1.9 (ref 1.2–2.2)
Albumin: 4.6 g/dL (ref 4.0–5.0)
Alkaline Phosphatase: 108 IU/L (ref 39–117)
BUN/Creatinine Ratio: 10 (ref 9–20)
BUN: 11 mg/dL (ref 6–20)
Bilirubin Total: 0.3 mg/dL (ref 0.0–1.2)
Calcium: 10.2 mg/dL (ref 8.7–10.2)
Chloride: 105 mmol/L (ref 96–106)
Creatinine, Ser: 1.08 mg/dL (ref 0.76–1.27)
GFR calc Af Amer: 99 mL/min/{1.73_m2} (ref >59)
GFR calc non Af Amer: 86 mL/min/{1.73_m2} (ref >59)
Globulin, Total: 2.4 g/dL (ref 1.5–4.5)
Glucose: 104 mg/dL — ABNORMAL HIGH (ref 65–99)
Potassium: 4 mmol/L (ref 3.5–5.2)
Sodium: 143 mmol/L (ref 134–144)
Total Protein: 7 g/dL (ref 6.0–8.5)

## 2019-01-20 LAB — MAGNESIUM: Magnesium: 2 mg/dL (ref 1.6–2.3)

## 2019-01-20 LAB — 25-HYDROXY VITAMIN D LCMS D2+D3
25-Hydroxy, Vitamin D-2: 1.2 ng/mL
25-Hydroxy, Vitamin D-3: 38 ng/mL
25-Hydroxy, Vitamin D: 39 ng/mL

## 2019-01-20 LAB — SEDIMENTATION RATE: Sed Rate: 29 mm/hr — ABNORMAL HIGH (ref 0–15)

## 2019-01-20 LAB — C-REACTIVE PROTEIN: CRP: 4 mg/L (ref 0–10)

## 2019-01-20 LAB — VITAMIN B12: Vitamin B-12: 399 pg/mL (ref 232–1245)

## 2019-03-03 ENCOUNTER — Other Ambulatory Visit: Payer: Self-pay | Admitting: Pain Medicine

## 2019-03-03 DIAGNOSIS — R700 Elevated erythrocyte sedimentation rate: Secondary | ICD-10-CM

## 2019-03-16 ENCOUNTER — Encounter: Payer: Self-pay | Admitting: Pain Medicine

## 2019-03-19 NOTE — Progress Notes (Signed)
Pain Management Virtual Encounter Note - Virtual Visit via Telephone Telehealth (real-time audio visits between healthcare provider and patient).   Patient's Phone No. & Preferred Pharmacy:  [phone] (home); [phone] (mobile); (Preferred) [phone] [email or web address]  [facility] - [place], [place] - [address] [place] [address] Phone: [phone] Fax: [phone]    Pre-screening note:  Our staff contacted Derek [name] and offered him an "in person", "face-to-face" appointment versus a telephone encounter. He indicated preferring the telephone encounter, at this time.   Reason for Virtual Visit: COVID-19*  Social distancing based on CDC and AMA recommendations.   I contacted Derek [name] on 03/20/2019 via telephone.      I clearly identified myself as Derek Done, MD. I verified that I was speaking with the correct person using two identifiers (Name: Derek [name], and date of birth: Derek [name], Derek [name]).  Advanced Informed Consent I sought verbal advanced consent from Derek [name] for virtual visit interactions. I informed Derek [name] of possible security and privacy concerns, risks, and limitations associated with providing "not-in-person" medical evaluation and management services. I also informed Derek [name] of the availability of "in-person" appointments. Finally, I informed him that there would be a charge for the virtual visit and that he could be  personally, fully or partially, financially responsible for it. Derek [name] expressed understanding and agreed to proceed.   Historic Elements   Derek [name] is a 38 y.o. year old, male patient evaluated today after his last encounter by our practice on 12/28/2018. Derek [name]  has a past medical history of Anxiety, Arthritis, and GERD (gastroesophageal reflux disease). He also  has a past surgical history that includes Back surgery; HAND REIMPLANTED; and Lumbar fusion ([name]/14). [name]  has a current medication list which includes the following prescription(s): cyclobenzaprine, oxycodone hcl, oxycodone hcl, oxycodone hcl, tizanidine, gnp calcium 1200, and meloxicam. He  reports that he has been smoking. He has a 16.00 pack-year smoking history. He has never used smokeless tobacco. He reports current alcohol use. He reports that he does not use drugs. Derek [name] is allergic to amoxicillin and vicodin [hydrocodone-acetaminophen].   HPI  Today, he is being contacted for medication management.  The patient indicates doing well with the current medication regimen. No adverse reactions or side effects reported to the medications.   Pharmacotherapy Assessment  Analgesic: OxycodoneIR10 mg, 1 tab PO q 6 hrs (40 mg/day of oxycodone) MME/day:60mg /day.   Monitoring: Pharmacotherapy: No side-effects or adverse reactions reported. Derek [name] PMP: PDMP reviewed during this encounter.       Compliance: No problems identified. Effectiveness: Clinically acceptable. Plan: Refer to "POC".  UDS:  Summary  Date Value Ref Range Status  01/16/2019 Note  Final    Comment:    ==================================================================== [employer] Select 13 (MW) ==================================================================== Test                             Result       Flag       Units Drug Present and Declared for Prescription Verification   Oxycodone                      834          EXPECTED   ng/mg creat   Oxymorphone                    4086  EXPECTED   ng/mg creat   Noroxycodone                   1565         EXPECTED   ng/mg creat   Noroxymorphone                 701          EXPECTED   ng/mg creat    Sources of oxycodone are scheduled prescription medications.    Oxymorphone, noroxycodone, and noroxymorphone are expected    metabolites of oxycodone. Oxymorphone is also available as a    scheduled prescription  medication. ==================================================================== Test                      Result    Flag   Units      Ref Range   Creatinine              88               mg/dL      >=60>=20 ==================================================================== Declared Medications:  The flagging and interpretation on this report are based on the  following declared medications.  Unexpected results may arise from  inaccuracies in the declared medications.  **Note: The testing scope of this panel includes these medications:  Oxycodone  **Note: The testing scope of this panel does not include the  following reported medications:  Calcium  Cyclobenzaprine (Flexeril)  Magnesium (Mag-Ox)  Tizanidine (Zanaflex)  Vitamin D  Vitamin D3 ==================================================================== For clinical consultation, please call [phone]. ====================================================================    Laboratory Chemistry Profile (12 mo)  Renal: 01/16/2019: BUN [name]; BUN/Creatinine Ratio 10; Creatinine, Ser 1.08  Lab Results  Component Value Date   GFRAA 99 01/16/2019   GFRNONAA 86 01/16/2019   Hepatic: 01/16/2019: Albumin 4.6 Lab Results  Component Value Date   AST 24 01/16/2019   Other: 01/16/2019: 25-Hydroxy, Vitamin D 39; 25-Hydroxy, Vitamin D-2 1.2; 25-Hydroxy, Vitamin D-3 38; CRP 4; Sed Rate 29; Vitamin B-12 399 Note: Above Lab results reviewed.  Imaging  Last 90 days:  No results found.  Assessment  The primary encounter diagnosis was Chronic pain syndrome. Diagnoses of Chronic low back pain (Primary Area of Pain) (Bilateral) (L>R) w/ sciatica (Bilateral), Chronic lower extremity pain (Secondary Area of Pain) (Bilateral) (L>R), Failed back surgical syndrome (x3), Chronic musculoskeletal pain, Spasm of back muscles, Vitamin D insufficiency, Elevated sed rate, DDD (degenerative disc disease), lumbosacral, Spondylosis of lumbar region without  myelopathy or radiculopathy, Epidural fibrosis, and Lumbar postlaminectomy syndrome were also pertinent to this visit.  Plan of Care  I have discontinued Derek [name]'s Oxycodone HCl and Oxycodone HCl. I have also changed his Oxycodone HCl. Additionally, I am having him start on Oxycodone HCl, Oxycodone HCl, and meloxicam. Lastly, I am having him maintain his tiZANidine, cyclobenzaprine, and GNP Calcium 1200.  Pharmacotherapy (Medications Ordered): Meds ordered this encounter  Medications  . tiZANidine (ZANAFLEX) 4 MG tablet    Sig: Take 1 tablet (4 mg total) by mouth 2 (two) times daily as needed for muscle spasms (For day-time muscle pain/spasm). Must last 60 days    Dispense:  120 tablet    Refill:  0    Fill one day early if pharmacy is closed on scheduled refill date. May substitute for generic if available.  . cyclobenzaprine (FLEXERIL) 10 MG tablet    Sig: Take 1 tablet (10 mg total) by mouth at bedtime. Must last 60 days  Dispense:  90 tablet    Refill:  0    Fill one day early if pharmacy is closed on scheduled refill date. May substitute for generic if available.  . Calcium Carbonate-Vit D-Min (GNP CALCIUM 1200) 1200-1000 MG-UNIT CHEW    Sig: Chew 1,200 mg by mouth daily with breakfast. Take in combination with vitamin D and magnesium.    Dispense:  90 tablet    Refill:  3    Fill one day early if pharmacy is closed on scheduled refill date. May substitute for generic if available.  . Oxycodone HCl 10 MG TABS    Sig: Take 1 tablet (10 mg total) by mouth every 6 (six) hours as needed (Only if needed, not around-the-clock). Must last 30 days    Dispense:  120 tablet    Refill:  0    Chronic Pain: STOP Act (Not applicable) Fill 1 day early if closed on refill date. Do not fill until: 03/28/2019. To last until: 04/27/2019. Avoid benzodiazepines within 8 hours of opioids  . Oxycodone HCl 10 MG TABS    Sig: Take 1 tablet (10 mg total) by mouth every 6 (six) hours as needed (Only  if needed, not around-the-clock). Must last 30 days    Dispense:  120 tablet    Refill:  0    Chronic Pain: STOP Act (Not applicable) Fill 1 day early if closed on refill date. Do not fill until: 04/27/2019. To last until: [name]/21/2020. Avoid benzodiazepines within 8 hours of opioids  . Oxycodone HCl 10 MG TABS    Sig: Take 1 tablet (10 mg total) by mouth every 6 (six) hours as needed (Only if needed, not around-the-clock). Must last 30 days    Dispense:  120 tablet    Refill:  0    Chronic Pain: STOP Act (Not applicable) Fill 1 day early if closed on refill date. Do not fill until: [name]/21/2020. To last until: 06/26/2019. Avoid benzodiazepines within 8 hours of opioids  . meloxicam (MOBIC) 15 MG tablet    Sig: Take 1 tablet (15 mg total) by mouth daily.    Dispense:  30 tablet    Refill:  2    Fill one day early if pharmacy is closed on scheduled refill date. May substitute for generic if available.   Orders:  Orders Placed This Encounter  Procedures  . Racz (One Day)    Standing Status:   Future    Standing Expiration Date:   03/19/2020    Scheduling Instructions:     Side: Left     Sedation: Patient is requesting sedation     Timeframe: ASAA   Follow-up plan:   Return in about 14 weeks (around 06/26/2019) for Procedure (w/ sedation): (L) RACZ Procedure #3.      Interventional management options:  Considering:   Diagnostic bilateral lumbar facet block Possible bilateral lumbar facet RFA Diagnostic bilateral sacroiliac joint block Possible bilateral sacroiliac joint RFA Possible spinal cord stimulator trial   Palliative PRN treatment(s):   Palliative left Racz procedure #3(targeting the left S2, S3 area) (last [name] on 09/08/2018)    Recent Visits Date Type Provider Dept  12/28/18 Office Visit Delano MetzNaveira, Glendon Dunwoody, MD [facility]  Showing recent visits within past 90 days and meeting all other requirements   Today's Visits Date Type Provider Dept  03/20/19  Office Visit Delano MetzNaveira, Azan Maneri, MD [facility]  Showing today's visits and meeting all other requirements   Future Appointments No visits were found meeting  these conditions.  Showing future appointments within next 90 days and meeting all other requirements   I discussed the assessment and treatment plan with the patient. The patient was provided an opportunity to ask questions and all were answered. The patient agreed with the plan and demonstrated an understanding of the instructions.  Patient advised to call back or seek an in-person evaluation if the symptoms or condition worsens.  Total duration of non-face-to-face encounter: 13 minutes.  Note by: Gaspar Cola, MD Date: 03/20/2019; Time: 2:04 PM  Note: This dictation was prepared with Dragon dictation. Any transcriptional errors that may result from this process are unintentional.  Disclaimer:  * Given the special circumstances of the COVID-19 pandemic, the federal government has announced that the Office for Civil Rights (OCR) will exercise its enforcement discretion and will not impose penalties on physicians using telehealth in the event of noncompliance with regulatory requirements under the [place] (HIPAA) in connection with the good faith provision of telehealth during the PJASN-05 national public health emergency. ([place])

## 2019-03-20 ENCOUNTER — Ambulatory Visit: Payer: BC Managed Care – PPO | Attending: Pain Medicine | Admitting: Pain Medicine

## 2019-03-20 ENCOUNTER — Other Ambulatory Visit: Payer: Self-pay

## 2019-03-20 DIAGNOSIS — G8929 Other chronic pain: Secondary | ICD-10-CM

## 2019-03-20 DIAGNOSIS — G894 Chronic pain syndrome: Secondary | ICD-10-CM | POA: Diagnosis not present

## 2019-03-20 DIAGNOSIS — M961 Postlaminectomy syndrome, not elsewhere classified: Secondary | ICD-10-CM

## 2019-03-20 DIAGNOSIS — M5442 Lumbago with sciatica, left side: Secondary | ICD-10-CM

## 2019-03-20 DIAGNOSIS — M5441 Lumbago with sciatica, right side: Secondary | ICD-10-CM

## 2019-03-20 DIAGNOSIS — M62830 Muscle spasm of back: Secondary | ICD-10-CM

## 2019-03-20 DIAGNOSIS — M47816 Spondylosis without myelopathy or radiculopathy, lumbar region: Secondary | ICD-10-CM

## 2019-03-20 DIAGNOSIS — M7918 Myalgia, other site: Secondary | ICD-10-CM

## 2019-03-20 DIAGNOSIS — M79604 Pain in right leg: Secondary | ICD-10-CM | POA: Diagnosis not present

## 2019-03-20 DIAGNOSIS — R700 Elevated erythrocyte sedimentation rate: Secondary | ICD-10-CM

## 2019-03-20 DIAGNOSIS — G9619 Other disorders of meninges, not elsewhere classified: Secondary | ICD-10-CM

## 2019-03-20 DIAGNOSIS — M79605 Pain in left leg: Secondary | ICD-10-CM

## 2019-03-20 DIAGNOSIS — G96198 Other disorders of meninges, not elsewhere classified: Secondary | ICD-10-CM

## 2019-03-20 DIAGNOSIS — M5137 Other intervertebral disc degeneration, lumbosacral region: Secondary | ICD-10-CM

## 2019-03-20 DIAGNOSIS — E559 Vitamin D deficiency, unspecified: Secondary | ICD-10-CM

## 2019-03-20 MED ORDER — OXYCODONE HCL 10 MG PO TABS: 10.0000 mg | ORAL_TABLET | Freq: Four times a day (QID) | ORAL | 0 refills | Status: DC | PRN

## 2019-03-20 MED ORDER — MELOXICAM 15 MG PO TABS: 15.0000 mg | ORAL_TABLET | Freq: Every day | ORAL | 2 refills | Status: DC

## 2019-03-20 MED ORDER — TIZANIDINE HCL 4 MG PO TABS: 4.0000 mg | ORAL_TABLET | Freq: Two times a day (BID) | ORAL | 0 refills | Status: DC | PRN

## 2019-03-20 MED ORDER — GNP CALCIUM 1200 1200-1000 MG-UNIT PO CHEW: 1200.0000 mg | CHEWABLE_TABLET | Freq: Every day | ORAL | 3 refills | Status: DC

## 2019-03-20 MED ORDER — CYCLOBENZAPRINE HCL 10 MG PO TABS: 10.0000 mg | ORAL_TABLET | Freq: Every day | ORAL | 0 refills | Status: DC

## 2019-03-20 NOTE — Patient Instructions (Signed)

## 2019-03-22 ENCOUNTER — Telehealth: Payer: Self-pay

## 2019-03-22 ENCOUNTER — Telehealth: Payer: Self-pay | Admitting: *Deleted

## 2019-03-22 NOTE — Telephone Encounter (Signed)
Appropriate pre RACZ paperwork has been done per Dena.

## 2019-03-22 NOTE — Telephone Encounter (Signed)
Please call patient to schedule Toradol/Norflex injection. Approved by Dr. Dossie Arbour. Patient aware you will be calling.

## 2019-03-22 NOTE — Telephone Encounter (Signed)
I scheduled him for his RACZ procedure 04/11/19 at 0815am.

## 2019-03-23 ENCOUNTER — Encounter: Payer: Self-pay | Admitting: Pain Medicine

## 2019-03-23 ENCOUNTER — Ambulatory Visit: Payer: BC Managed Care – PPO | Attending: Pain Medicine | Admitting: Pain Medicine

## 2019-03-23 ENCOUNTER — Other Ambulatory Visit: Payer: Self-pay

## 2019-03-23 VITALS — BP 135/84 | HR 98 | Temp 98.3°F | Resp 16 | Ht 70.0 in | Wt 189.0 lb

## 2019-03-23 DIAGNOSIS — M62830 Muscle spasm of back: Secondary | ICD-10-CM

## 2019-03-23 DIAGNOSIS — M961 Postlaminectomy syndrome, not elsewhere classified: Secondary | ICD-10-CM | POA: Diagnosis not present

## 2019-03-23 DIAGNOSIS — G8929 Other chronic pain: Secondary | ICD-10-CM | POA: Diagnosis present

## 2019-03-23 DIAGNOSIS — M5441 Lumbago with sciatica, right side: Secondary | ICD-10-CM | POA: Insufficient documentation

## 2019-03-23 DIAGNOSIS — M5442 Lumbago with sciatica, left side: Secondary | ICD-10-CM | POA: Insufficient documentation

## 2019-03-23 MED ORDER — KETOROLAC TROMETHAMINE 60 MG/2ML IM SOLN
INTRAMUSCULAR | Status: AC
  Filled 2019-03-23: qty 2

## 2019-03-23 MED ORDER — KETOROLAC TROMETHAMINE 60 MG/2ML IM SOLN
60.0000 mg | Freq: Once | INTRAMUSCULAR | Status: AC
  Administered 2019-03-23: 60 mg via INTRAMUSCULAR

## 2019-03-23 MED ORDER — ORPHENADRINE CITRATE 30 MG/ML IJ SOLN
INTRAMUSCULAR | Status: AC
  Filled 2019-03-23: qty 2

## 2019-03-23 MED ORDER — ORPHENADRINE CITRATE 30 MG/ML IJ SOLN
60.0000 mg | Freq: Once | INTRAMUSCULAR | Status: AC
  Administered 2019-03-23: 15:00:00 60 mg via INTRAMUSCULAR

## 2019-03-23 NOTE — Progress Notes (Signed)
Patient's Name: Derek [name]  MRN: [id number]  Referring Provider: Sherron Mondayejan-Sie, S Ahmed, MD  DOB: 03/30/1981  PCP: Sherron Mondayejan-Sie, S Ahmed, MD  DOS: 03/23/2019  Note by: Oswaldo DoneFrancisco A Hazely Sealey, MD  Service setting: Ambulatory outpatient  Attending: Oswaldo DoneFrancisco A Gates Jividen, MD  Location: [facility] (AMB) Pain Management Facility  Specialty: Interventional Pain Management  Patient type: Established   CC: Leg Pain (left, lower) and Back Pain  S: Derek [name] is a 38 y.o. year old, male patient, who comes today for a nurse visit complaining of Leg Pain (left, lower) and Back Pain His last contact with us was on 03/22/2019. Severity of the pain is described as a 8 /10.   O [name]'s  height is 5\' 10"  (1.778 m) and weight is 189 lb (85.7 kg). His oral temperature is 98.3 F (36.8 C). His blood pressure is 135/84 and his pulse is 98. His respiration is 16 and oxygen saturation is 100%.  Body mass index is 27.12 kg/m.  A: The primary encounter diagnosis was Chronic low back pain (Primary Area of Pain) (Bilateral) (L>R) w/ sciatica (Bilateral). Diagnoses of Lumbar postlaminectomy syndrome and Spasm of back muscles were also pertinent to this visit.  Plan of Care  Orders:  No orders of the defined types were placed in this encounter.  Chronic Opioid Analgesic:  OxycodoneIR10 mg, 1 tab PO q 6 hrs (40 mg/day of oxycodone) MME/day:60mg /day.   Medications ordered for procedure: Meds ordered this encounter  Medications  . ketorolac (TORADOL) injection 60 mg  . orphenadrine (NORFLEX) injection 60 mg   Medications administered: We administered ketorolac and orphenadrine.  See the medical record for exact dosing, route, and time of administration.  Follow-up plan:   Return for scheduled appointment.  IM Toradol/Norflex 60/60 mg today.     Interventional management options:  Considering:   Diagnostic bilateral lumbar facet block Possible bilateral lumbar facet RFA Diagnostic bilateral sacroiliac  joint block Possible bilateral sacroiliac joint RFA Possible spinal cord stimulator trial   Palliative PRN treatment(s):   Palliative left Racz procedure #3(targeting the left S2, S3 area) (last done on 09/08/2018)     Recent Visits Date Type Provider Dept  03/23/19 Office Visit Delano MetzNaveira, Jerick Khachatryan, MD [facility]  03/20/19 Office Visit Delano MetzNaveira, Macaiah Mangal, MD [facility]  12/28/18 Office Visit Delano MetzNaveira, Jonalyn Sedlak, MD [facility]  Showing recent visits within past 90 days and meeting all other requirements   Future Appointments Date Type Provider Dept  04/11/19 Appointment Delano MetzNaveira, Ewan Grau, MD [facility]  Showing future appointments within next 90 days and meeting all other requirements   Disposition: Discharge home  Discharge Date & Time: 03/23/2019;   hrs.   Primary Care Physician: Sherron Mondayejan-Sie, S Ahmed, MD [facility] Note by: Oswaldo DoneFrancisco A Sela Falk, MD Date: 03/23/2019; Time: 3:27 PM  Disclaimer:  Medicine is not an Visual merchandiserexact science. The only guarantee in medicine is that nothing is guaranteed. It is important to note that the decision to proceed with this intervention was based on the information collected from the patient. The Data and conclusions were drawn from the patient's questionnaire, the interview, and the physical examination. Because the information was provided in large part by the patient, it cannot be guaranteed that it has not been purposely or unconsciously manipulated. Every effort has been made to obtain as much relevant data as possible for this evaluation. It is important to note that the conclusions that lead to this procedure are derived  in large part from the available data. Always take into account that the treatment will also be dependent on availability of resources and existing treatment guidelines, considered by other Pain Management Practitioners as being common knowledge and  practice, at the time of the intervention. For Medico-Legal purposes, it is also important to point out that variation in procedural techniques and pharmacological choices are the acceptable norm. The indications, contraindications, technique, and results of the above procedure should only be interpreted and judged by a Board-Certified Interventional Pain Specialist with extensive familiarity and expertise in the same exact procedure and technique.

## 2019-03-23 NOTE — Progress Notes (Signed)
Safety precautions to be maintained throughout the outpatient stay will include: orient to surroundings, keep bed in low position, maintain call bell within reach at all times, provide assistance with transfer out of bed and ambulation.  

## 2019-04-10 NOTE — Progress Notes (Signed)
Patient's Name: Derek [name]  MRN: [id number]  Referring Provider: Milinda Pointer, MD  DOB: 1981/01/18  PCP: Jodi Marble, MD  DOS: 04/11/2019  Note by: Gaspar Cola, MD  Service setting: Ambulatory outpatient  Specialty: Interventional Pain Management  Patient type: Established  Location: [facility] (AMB) Pain Management Facility  Visit type: Interventional Procedure   Primary Reason for Visit: Interventional Pain Management Treatment. CC: Back Pain (lower )  Procedure:          Anesthesia, Analgesia, Anxiolysis:  Type: Therapeutic Percutaneous Epidural Neuroplasty and Lysis of Adhesions (RACZ Procedure)  #3  Region: Caudal Level: Sacrococcygeal   Laterality: Midline aiming at the left (targeting the left S2, S3 area)  Type: Moderate (Conscious) Sedation combined with Local Anesthesia Indication(s): Analgesia and Anxiety Route: Intravenous (IV) IV Access: Secured Sedation: Meaningful verbal contact was maintained at all times during the procedure  Local Anesthetic: Lidocaine 1-2%  Position: Prone   Indications: 1. Chronic low back pain (Primary Area of Pain) (Bilateral) (L>R) w/ sciatica (Bilateral)   2. Chronic lower extremity pain (Secondary Area of Pain) (Bilateral) (L>R)   3. Failed back surgical syndrome (x3)   4. Epidural fibrosis   5. DDD (degenerative disc disease), lumbosacral    Pain Score: Pre-procedure: 1 /10 Post-procedure: 0-No pain/10   Pre-op Assessment:  Derek [name] is a 38 y.o. (year old), male patient, seen today for interventional treatment. He  has a past surgical history that includes Back surgery; HAND REIMPLANTED; and Lumbar fusion (11/14). Derek [name] has a current medication list which includes the following prescription(s): gnp calcium 1200, cyclobenzaprine, meloxicam, oxycodone hcl, oxycodone hcl, oxycodone hcl, tizanidine, and gnp vitamin d super strength, and the following Facility-Administered Medications: fentanyl and midazolam. His primarily  concern today is the Back Pain (lower )  Initial Vital Signs:  Pulse/HCG Rate: 91ECG Heart Rate: (!) 106 Temp: 98.3 F (36.8 C) Resp: 16 BP: 137/82 SpO2: 99 %  BMI: Estimated body mass index is 27.12 kg/m as calculated from the following:   Height as of this encounter: '5\' 10"'  (1.778 m).   Weight as of this encounter: 189 lb (85.7 kg).  Risk Assessment: Allergies: Reviewed. He is allergic to amoxicillin and vicodin [hydrocodone-acetaminophen].  Allergy Precautions: None required Coagulopathies: Reviewed. None identified.  Blood-thinner therapy: None at this time Active Infection(s): Reviewed. None identified. Derek [name] is afebrile  Site Confirmation: Derek [name] was asked to confirm the procedure and laterality before marking the site Procedure checklist: Completed Consent: Before the procedure and under the influence of no sedative(s), amnesic(s), or anxiolytics, the patient was informed of the treatment options, risks and possible complications. To fulfill our ethical and legal obligations, as recommended by the American Medical Association's Code of Ethics, I have informed the patient of my clinical impression; the nature and purpose of the treatment or procedure; the risks, benefits, and possible complications of the intervention; the alternatives, including doing nothing; the risk(s) and benefit(s) of the alternative treatment(s) or procedure(s); and the risk(s) and benefit(s) of doing nothing. The patient was provided information about the general risks and possible complications associated with the procedure. These may include, but are not limited to: failure to achieve desired goals, infection, bleeding, organ or nerve damage, allergic reactions, paralysis, and death. In addition, the patient was informed of those risks and complications associated to Spine-related procedures, such as failure to decrease pain; infection (i.e.: Meningitis, epidural or intraspinal abscess); bleeding  (i.e.: epidural hematoma, subarachnoid hemorrhage, or any other type of intraspinal  or peri-dural bleeding); organ or nerve damage (i.e.: Any type of peripheral nerve, nerve root, or spinal cord injury) with subsequent damage to sensory, motor, and/or autonomic systems, resulting in permanent pain, numbness, and/or weakness of one or several areas of the body; allergic reactions; (i.e.: anaphylactic reaction); and/or death. Furthermore, the patient was informed of those risks and complications associated with the medications. These include, but are not limited to: allergic reactions (i.e.: anaphylactic or anaphylactoid reaction(s)); adrenal axis suppression; blood sugar elevation that in diabetics may result in ketoacidosis or comma; water retention that in patients with history of congestive heart failure may result in shortness of breath, pulmonary edema, and decompensation with resultant heart failure; weight gain; swelling or edema; medication-induced neural toxicity; particulate matter embolism and blood vessel occlusion with resultant organ, and/or nervous system infarction; and/or aseptic necrosis of one or more joints. Finally, the patient was informed that Medicine is not an exact science; therefore, there is also the possibility of unforeseen or unpredictable risks and/or possible complications that may result in a catastrophic outcome. The patient indicated having understood very clearly. We have given the patient no guarantees and we have made no promises. Enough time was given to the patient to ask questions, all of which were answered to the patient's satisfaction. Mr. Righi has indicated that he wanted to continue with the procedure. Attestation: I, the ordering provider, attest that I have discussed with the patient the benefits, risks, side-effects, alternatives, likelihood of achieving goals, and potential problems during recovery for the procedure that I have provided informed consent. Date    Time: 04/11/2019  8:17 AM  Pre-Procedure Preparation:  Monitoring: As per clinic protocol. Respiration, ETCO2, SpO2, BP, heart rate and rhythm monitor placed and checked for adequate function Safety Precautions: Patient was assessed for positional comfort and pressure points before starting the procedure. Time-out: I initiated and conducted the "Time-out" before starting the procedure, as per protocol. The patient was asked to participate by confirming the accuracy of the "Time Out" information. Verification of the correct person, site, and procedure were performed and confirmed by me, the nursing staff, and the patient. "Time-out" conducted as per Joint Commission's Universal Protocol (UP.01.01.01). Time: 0959  Description of Procedure:          Target Area: Caudal Epidural Canal Approach: Midline approach Area Prepped: Entire Posterior Sacrococcygeal Region Prepping solution: DuraPrep (Iodine Povacrylex [0.7% available iodine] and Isopropyl Alcohol, 74% w/w) Safety Precautions: Aspiration looking for blood return was conducted prior to all injections. At no point did I inject any substances, as a needle was being advanced. No attempts were made at seeking any paresthesias. Safe injection practices and needle disposal techniques used. Medications properly checked for expiration dates. SDV (single dose vial) medications used. Description of the Procedure: Protocol guidelines were followed. The patient was placed in position over the fluoroscopy table. Patient assessed for comfort and pressure points before starting procedure. The target area was identified and the area prepped in the usual manner. Skin & deeper tissues infiltrated with local anesthetic. Appropriate amount of time allowed to pass for local anesthetics to take effect. The epidural needle was then advanced to the target area, via the sacral hiatus, between the sacral cornu. Proper needle placement secured. Negative aspiration  confirmed. Step 1: Epidurogram performed by slowly injecting a non-ionic, water-soluble, hypoallergenic, myelogram-compatible radiological contrast. Defect identified and Racz catheter advanced slowly next to rest proximal to it without attempting to perforate or puncture the defect. At this point, the epidural needle  was removed. Step 2: Contrast was again injected, this time thru the catheter, under live fluoroscopy, closely observing for vascular uptake or intrathecal spread. Step 3: Once no vascular uptake or intrathecal spread was confirmed, a 2 mL test-dose using 2% PF-Lidocaine with 1:200,000 Epinephrine was injected thru the catheter, while closely monitoring for an increase in heart rate or evidence of spinal anesthesia. Step 4: After waiting over 5 minutes, the patient was assessed for evidence of a spinal block. Step 5: Once I had confirmed that there was no vascular uptake or evidence of intrathecal spread, I then slowly injected 1,500 Units of hyaluronidase, carefully monitoring for allergic reactions. I then waited 10 minutes, using this time to secure the catheter using a sterile benzoin tincture swab and (15m x 100 mm) steri-strip. After confirming vitals to be stable, the patient was transferred to the recovery area where Mr. JSieverwas kept under constant observation and monitored as per post-sedation protocol. Step 6: After the 10 minutes, I proceeded to slowly inject a solution containing 0.2% MPF-Ropivacaine (4 mL) + 0.9% PF-NSS (5 mL) + SDV Triamcinolone 40 mg/mL (1 mL), in intermittent fashion, asking for systemic symptoms every 0.5cc of injectate. Step 7:  30 minutes later, a neurological exam was conducted for any evidence of a spinal blockade. Step 8:  After confirming the absence of anesthesia, I slowly injected the 10% PF-Hypertonic Saline, stopping to assess, any time the patient described any discomfort. Once the procedure was completed, I removed the catheter, taking time to  show the patient its spring tip, and demonstrating to the patient that none had been left behind. EBL: None Materials & Medications used:  1. Racz Tun-L-Kath (Epimed, [place] Catheter. (or similar)(Perifix 20Gx100cm) 2. 2 Foam Tape 3. Benzoin tincture swab 4. Steri-Strip (165mx 100 mm) 5. Non-occlusive Transparent Dressing (67M Tegaderm) 6. Bacteriostatic Filter for Epidural Catheter 7. Epidural Kit for 20G catheter Needle(s) used: 20g - 10cm, Tuohy-style epidural needle   Medications used:  1. Skin infiltration: 2.0% Lidocaine (104m2. Test-dose: 1.5 % Lidocaine w/ Epi 1:200,000 (5ml25m. Epidural Steroid injection:  A). Steroid: Triamcinolone 40 mg/mL (SDV) (1ml)87m. Local Anesthetic: 0.2% PF-Ropivacaine (2 mg/mL) (4 mL) C). Dilution agent: 0.9% PF-NSS (injectable saline) (5 mL) 6. Neurolytic Agent: 10% PF-Sodium Chloride (Hypertonic Saline) (10ml)2m.4% PF-Sodium Chloride (4.273mL) 75m-Sterile H2O (5.727mL) =20m PF-Sodium Chloride (10mL)] 785mar softening agent: Hyaluronidase (150 units/mL) x (10 mL) = 1500 Units 8. Radiological Contrast Media: Isovue-M 200 (10 mL)  Vitals:   04/11/19 1108 04/11/19 1118 04/11/19 1128 04/11/19 1140  BP: (!) 119/58 125/64 132/75 111/60  Pulse:      Resp: '16 18 15 18  ' Temp:    (!) 97.3 F (36.3 C)  TempSrc:    Temporal  SpO2: 95% 98% 95% 99%  Weight:      Height:        Start Time: 0959 hrs. End Time: 1031 hrs. Epidurogram:         Epidurogram flow pattern demonstrated a restricted low at the level of the surgery. The epidural catheter was placed next to this restriction without attempting to perforate it.  Initial epidurogram demonstrated a defect on the left side of the sacral epidural space for the S2 and S1 nerve roots once the procedure was started, this space was opened appropriately. Materials:  Needle(s) Type: Epidural needle Gauge: 20G Length: 3.5-in Medication(s): Please see orders for medications and dosing  details.  Imaging Guidance (Spinal):  Type of Imaging Technique: Fluoroscopy Guidance (Spinal) Indication(s): Assistance in needle guidance and placement for procedures requiring needle placement in or near specific anatomical locations not easily accessible without such assistance. Exposure Time: Please see nurses notes. Contrast: Before injecting any contrast, we confirmed that the patient did not have an allergy to iodine, shellfish, or radiological contrast. Once satisfactory needle placement was completed at the desired level, radiological contrast was injected. Contrast injected under live fluoroscopy. No contrast complications. See chart for type and volume of contrast used. Fluoroscopic Guidance: I was personally present during the use of fluoroscopy. "Tunnel Vision Technique" used to obtain the best possible view of the target area. Parallax error corrected before commencing the procedure. "Direction-depth-direction" technique used to introduce the needle under continuous pulsed fluoroscopy. Once target was reached, antero-posterior, oblique, and lateral fluoroscopic projection used confirm needle placement in all planes. Images permanently stored in EMR.      Interpretation: I personally interpreted the imaging intraoperatively. Adequate needle placement confirmed in multiple planes. Appropriate spread of contrast into desired area was observed. No evidence of afferent or efferent intravascular uptake. No intrathecal or subarachnoid spread observed. Permanent images saved into the patient's record.  Antibiotic Prophylaxis:   Anti-infectives (From admission, onward)   Start     Dose/Rate Route Frequency Ordered Stop   04/11/19 0815  gentamicin (GARAMYCIN) 120 mg in dextrose 5 % 50 mL IVPB     120 mg 106 mL/hr over 30 Minutes Intravenous  Once 04/11/19 0813 04/11/19 1020     Indication(s): None identified  Post-operative Assessment:  Post-procedure Vital Signs:  Pulse/HCG  Rate: 9192 Temp: (!) 97.3 F (36.3 C) Resp: 18 BP: 111/60 SpO2: 99 %  EBL: None  Complications: No immediate post-treatment complications observed by team, or reported by patient.  Note: The patient tolerated the entire procedure well. A repeat set of vitals were taken after the procedure and the patient was kept under observation following institutional policy, for this type of procedure. Post-procedural neurological assessment was performed, showing return to baseline, prior to discharge. The patient was provided with post-procedure discharge instructions, including a section on how to identify potential problems. Should any problems arise concerning this procedure, the patient was given instructions to immediately contact us, at any time, without hesitation. In any case, we plan to contact the patient by telephone for a follow-up status report regarding this interventional procedure.  Comments:  No additional relevant information.  Plan of Care  Orders:  Orders Placed This Encounter  Procedures   Racz (One Day)    Equipment: Epidural Tray; Racz Catheter; 10% Hypertonic Saline; Hyaluronidase; 3/4" Steri-strips; 4x4 Sterile Gauze pack.    Scheduling Instructions:     Procedure: RACZ Epidural Lysis of Adhesions     Side: Midline     Sedation: Moderate Conscious Sedation     Timeframe: Today   DG PA[facility] C-ARM 1-60 MIN NO REPORT    Intraoperative interpretation by procedural physician at [facility].    Standing Status:   Standing    Number of Occurrences:   1    Order Specific Question:   Reason for exam:    Answer:   Assistance in needle guidance and placement for procedures requiring needle placement in or near specific anatomical locations not easily accessible without such assistance.   Informed Consent Details: Physician/Practitioner Attestation; Transcribe to consent form and obtain patient signature    Provider Attestation: I, [place] Dossie Arbour, MD, (Pain  Management Specialist), the physician/practitioner, attest that  I have discussed with the patient the benefits, risks, side effects, alternatives, likelihood of achieving goals and potential problems during recovery for the procedure that I have provided informed consent.    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     Note: Always confirm area and laterality of pain with Mr. Massmann, before procedure.   Chronic Opioid Analgesic:  OxycodoneIR10 mg, 1 tab PO q 6 hrs (40 mg/day of oxycodone) MME/day:77m/day.   Medications ordered for procedure: Meds ordered this encounter  Medications   iohexol (OMNIPAQUE) 180 MG/ML injection 10 mL    Must be Myelogram-compatible. If not available, you may substitute with a water-soluble, non-ionic, hypoallergenic, myelogram-compatible radiological contrast medium.   lidocaine (XYLOCAINE) 2 % (with pres) injection 400 mg   lactated ringers infusion 1,000 mL   midazolam (VERSED) 5 MG/5ML injection 1-2 mg    Make sure Flumazenil is available in the pyxis when using this medication. If oversedation occurs, administer 0.2 mg IV over 15 sec. If after 45 sec no response, administer 0.2 mg again over 1 min; may repeat at 1 min intervals; not to exceed 4 doses (1 mg)   fentaNYL (SUBLIMAZE) injection 25-50 mcg    Make sure Narcan is available in the pyxis when using this medication. In the event of respiratory depression (RR< 8/min): Titrate NARCAN (naloxone) in increments of 0.1 to 0.2 mg IV at 2-3 minute intervals, until desired degree of reversal.   lidocaine-EPINEPHrine (XYLOCAINE W/EPI) 2 %-1:200000 (PF) injection 10 mL    If 2% is unavailable, may be substituted with 1.5%, but must be preservative-free. If 1:200,000 concentration of epinephrine is not  available, it may be substituted with 1:100,000. Notify physician of changes, before procedure.   ropivacaine (PF) 2 mg/mL (0.2%) (NAROPIN) injection 4 mL   triamcinolone acetonide (KENALOG-40) injection 40 mg   sodium chloride flush (NS) 0.9 % injection 4 mL   hyaluronidase Human (HYLENEX) injection 1,500 Units    10 mL of the 150 Units/mL   sodium chloride hypertonic 10% epidural injection    For Racz Epidural Lysis of Adhesions.   gentamicin (GARAMYCIN) 120 mg in dextrose 5 % 50 mL IVPB    Order Specific Question:   Antibiotic Indication:    Answer:   Surgical Prophylaxis   Medications administered: We administered iohexol, lidocaine, lactated ringers, midazolam, fentaNYL, lidocaine-EPINEPHrine, ropivacaine (PF) 2 mg/mL (0.2%), triamcinolone acetonide, sodium chloride flush, hyaluronidase Human, sodium chloride hypertonic 10% epidural injection, and gentamicin (GARAMYCIN) 120 mg in dextrose 5 % 50 mL IVPB.  See the medical record for exact dosing, route, and time of administration.  Follow-up plan:   Return in about 2 weeks (around 04/25/2019) for (VV), (PP).      Interventional management options:  Considering:   Diagnostic bilateral lumbar facet block Possible bilateral lumbar facet RFA Diagnostic bilateral sacroiliac joint block Possible bilateral sacroiliac joint RFA Possible spinal cord stimulator trial   Palliative PRN treatment(s):   Palliative left Racz procedure #4(targeting the left S2, S3 area) (last done on 04/11/2019)    Recent Visits Date Type Provider Dept  03/23/19 Office Visit NMilinda Pointer MD [facility]  03/20/19 Office Visit NMilinda Pointer MD [facility]  Showing recent visits within past 90 days and meeting all other requirements   Today's Visits Date Type Provider Dept  04/11/19 Procedure visit  Milinda Pointer, MD [facility]  Showing today's visits and meeting all other requirements   Future  Appointments Date Type Provider Dept  04/26/19 Appointment Milinda Pointer, MD [facility]  06/26/19 Appointment Milinda Pointer, MD [facility]  Showing future appointments within next 90 days and meeting all other requirements   Disposition: Discharge home  Discharge Date & Time: 04/11/2019; 1142 hrs.   Primary Care Physician: Jodi Marble, MD Location: [facility] Note by: Gaspar Cola, MD Date: 04/11/2019; Time: 12:27 PM  Disclaimer:  Medicine is not an Chief Strategy Officer. The only guarantee in medicine is that nothing is guaranteed. It is important to note that the decision to proceed with this intervention was based on the information collected from the patient. The Data and conclusions were drawn from the patient's questionnaire, the interview, and the physical examination. Because the information was provided in large part by the patient, it cannot be guaranteed that it has not been purposely or unconsciously manipulated. Every effort has been made to obtain as much relevant data as possible for this evaluation. It is important to note that the conclusions that lead to this procedure are derived in large part from the available data. Always take into account that the treatment will also be dependent on availability of resources and existing treatment guidelines, considered by other Pain Management Practitioners as being common knowledge and practice, at the time of the intervention. For Medico-Legal purposes, it is also important to point out that variation in procedural techniques and pharmacological choices are the acceptable norm. The indications, contraindications, technique, and results of the above procedure should only be interpreted and judged by a Board-Certified Interventional Pain Specialist with extensive familiarity and expertise in the same exact procedure and technique.

## 2019-04-11 ENCOUNTER — Ambulatory Visit
Admission: RE | Admit: 2019-04-11 | Discharge: 2019-04-11 | Disposition: A | Discharge status: Home / Self Care | Payer: BC Managed Care – PPO | Source: Ambulatory Visit | Attending: Pain Medicine | Admitting: Pain Medicine

## 2019-04-11 ENCOUNTER — Ambulatory Visit (HOSPITAL_BASED_OUTPATIENT_CLINIC_OR_DEPARTMENT_OTHER): Payer: BC Managed Care – PPO | Admitting: Pain Medicine

## 2019-04-11 ENCOUNTER — Other Ambulatory Visit: Payer: Self-pay

## 2019-04-11 ENCOUNTER — Encounter: Payer: Self-pay | Admitting: Pain Medicine

## 2019-04-11 VITALS — BP 111/60 | HR 91 | Temp 97.3°F | Resp 18 | Ht 70.0 in | Wt 189.0 lb

## 2019-04-11 DIAGNOSIS — M5441 Lumbago with sciatica, right side: Secondary | ICD-10-CM | POA: Diagnosis not present

## 2019-04-11 DIAGNOSIS — M5442 Lumbago with sciatica, left side: Secondary | ICD-10-CM | POA: Insufficient documentation

## 2019-04-11 DIAGNOSIS — G8929 Other chronic pain: Secondary | ICD-10-CM | POA: Insufficient documentation

## 2019-04-11 DIAGNOSIS — M5137 Other intervertebral disc degeneration, lumbosacral region: Secondary | ICD-10-CM | POA: Diagnosis present

## 2019-04-11 DIAGNOSIS — M79604 Pain in right leg: Secondary | ICD-10-CM | POA: Diagnosis present

## 2019-04-11 DIAGNOSIS — G96198 Other disorders of meninges, not elsewhere classified: Secondary | ICD-10-CM | POA: Insufficient documentation

## 2019-04-11 DIAGNOSIS — M79605 Pain in left leg: Secondary | ICD-10-CM | POA: Insufficient documentation

## 2019-04-11 DIAGNOSIS — M961 Postlaminectomy syndrome, not elsewhere classified: Secondary | ICD-10-CM

## 2019-04-11 MED ORDER — SODIUM CHLORIDE 0.9% FLUSH
4.0000 mL | Freq: Once | INTRAVENOUS | Status: AC
  Administered 2019-04-11: 4 mL

## 2019-04-11 MED ORDER — IOHEXOL 180 MG/ML  SOLN
10.0000 mL | Freq: Once | INTRAMUSCULAR | Status: AC
  Administered 2019-04-11: 10:00:00 10 mL via EPIDURAL
  Filled 2019-04-11: qty 20

## 2019-04-11 MED ORDER — FENTANYL CITRATE (PF) 100 MCG/2ML IJ SOLN
25.0000 ug | INTRAMUSCULAR | Status: DC | PRN
  Administered 2019-04-11: 10:00:00 100 ug via INTRAVENOUS

## 2019-04-11 MED ORDER — GENTAMICIN SULFATE 40 MG/ML IJ SOLN
120.0000 mg | Freq: Once | INTRAVENOUS | Status: AC
  Administered 2019-04-11: 10:00:00 120 mg via INTRAVENOUS
  Filled 2019-04-11: qty 3

## 2019-04-11 MED ORDER — HYALURONIDASE HUMAN 150 UNIT/ML IJ SOLN
1500.0000 [IU] | Freq: Once | INTRAMUSCULAR | Status: AC
  Administered 2019-04-11: 10:00:00 150 [IU] via INTRADERMAL
  Filled 2019-04-11 (×2): qty 10

## 2019-04-11 MED ORDER — LACTATED RINGERS IV SOLN
1000.0000 mL | Freq: Once | INTRAVENOUS | Status: AC
  Administered 2019-04-11: 1000 mL via INTRAVENOUS

## 2019-04-11 MED ORDER — STERILE WATER FOR INJECTION IJ SOLN
10.0000 mL | Freq: Once | INTRAVENOUS | Status: AC
  Administered 2019-04-11: 10 mL via EPIDURAL
  Filled 2019-04-11: qty 4.27

## 2019-04-11 MED ORDER — MIDAZOLAM HCL 5 MG/5ML IJ SOLN
1.0000 mg | INTRAMUSCULAR | Status: DC | PRN
  Administered 2019-04-11: 11:00:00 5 mg via INTRAVENOUS

## 2019-04-11 MED ORDER — LIDOCAINE HCL 2 % IJ SOLN
20.0000 mL | Freq: Once | INTRAMUSCULAR | Status: AC
  Administered 2019-04-11: 10:00:00 400 mg
  Filled 2019-04-11: qty 20

## 2019-04-11 MED ORDER — LIDOCAINE-EPINEPHRINE (PF) 2 %-1:200000 IJ SOLN
10.0000 mL | Freq: Once | INTRAMUSCULAR | Status: AC
  Administered 2019-04-11: 20 mL
  Filled 2019-04-11: qty 20

## 2019-04-11 MED ORDER — TRIAMCINOLONE ACETONIDE 40 MG/ML IJ SUSP
40.0000 mg | Freq: Once | INTRAMUSCULAR | Status: AC
  Administered 2019-04-11: 10:00:00 40 mg
  Filled 2019-04-11: qty 1

## 2019-04-11 MED ORDER — ROPIVACAINE HCL 2 MG/ML IJ SOLN
4.0000 mL | Freq: Once | INTRAMUSCULAR | Status: AC
  Administered 2019-04-11: 10:00:00 10 mL via EPIDURAL
  Filled 2019-04-11: qty 10

## 2019-04-11 NOTE — Patient Instructions (Signed)

## 2019-04-11 NOTE — Progress Notes (Signed)
Safety precautions to be maintained throughout the outpatient stay will include: orient to surroundings, keep bed in low position, maintain call bell within reach at all times, provide assistance with transfer out of bed and ambulation.  

## 2019-04-12 ENCOUNTER — Telehealth: Payer: Self-pay | Admitting: *Deleted

## 2019-04-12 NOTE — Telephone Encounter (Signed)
No problems post procedure. 

## 2019-04-25 ENCOUNTER — Encounter: Payer: Self-pay | Admitting: Pain Medicine

## 2019-04-26 ENCOUNTER — Ambulatory Visit: Payer: BC Managed Care – PPO | Attending: Pain Medicine | Admitting: Pain Medicine

## 2019-04-26 ENCOUNTER — Other Ambulatory Visit: Payer: Self-pay

## 2019-04-26 DIAGNOSIS — G8929 Other chronic pain: Secondary | ICD-10-CM

## 2019-04-26 DIAGNOSIS — M5442 Lumbago with sciatica, left side: Secondary | ICD-10-CM | POA: Diagnosis not present

## 2019-04-26 DIAGNOSIS — M5441 Lumbago with sciatica, right side: Secondary | ICD-10-CM

## 2019-04-26 DIAGNOSIS — M79605 Pain in left leg: Secondary | ICD-10-CM

## 2019-04-26 DIAGNOSIS — E559 Vitamin D deficiency, unspecified: Secondary | ICD-10-CM

## 2019-04-26 DIAGNOSIS — M961 Postlaminectomy syndrome, not elsewhere classified: Secondary | ICD-10-CM

## 2019-04-26 DIAGNOSIS — G894 Chronic pain syndrome: Secondary | ICD-10-CM

## 2019-04-26 DIAGNOSIS — M7918 Myalgia, other site: Secondary | ICD-10-CM

## 2019-04-26 DIAGNOSIS — M79604 Pain in right leg: Secondary | ICD-10-CM | POA: Diagnosis not present

## 2019-04-26 DIAGNOSIS — M47816 Spondylosis without myelopathy or radiculopathy, lumbar region: Secondary | ICD-10-CM

## 2019-04-26 DIAGNOSIS — M62830 Muscle spasm of back: Secondary | ICD-10-CM

## 2019-04-26 DIAGNOSIS — G96198 Other disorders of meninges, not elsewhere classified: Secondary | ICD-10-CM

## 2019-04-26 DIAGNOSIS — M533 Sacrococcygeal disorders, not elsewhere classified: Secondary | ICD-10-CM

## 2019-04-26 MED ORDER — OXYCODONE HCL 10 MG PO TABS: 10.0000 mg | ORAL_TABLET | Freq: Four times a day (QID) | ORAL | 0 refills | Status: DC | PRN

## 2019-04-26 MED ORDER — CYCLOBENZAPRINE HCL 10 MG PO TABS: 10.0000 mg | ORAL_TABLET | Freq: Every day | ORAL | 0 refills | Status: DC

## 2019-04-26 MED ORDER — TIZANIDINE HCL 4 MG PO TABS: 4.0000 mg | ORAL_TABLET | Freq: Two times a day (BID) | ORAL | 0 refills | Status: DC | PRN

## 2019-04-26 NOTE — Patient Instructions (Signed)

## 2019-04-26 NOTE — Progress Notes (Signed)
Pain Management Virtual Encounter Note - Virtual Visit via Telephone Telehealth (real-time audio visits between healthcare provider and patient).   Patient's Phone No. & Preferred Pharmacy:  [phone] (home); [phone] (mobile); (Preferred) [phone] [email or web address]  [facility] - [place], [place] - [address] [place] [address] Phone: [phone] Fax: [phone]    Pre-screening note:  Our staff contacted Derek [name] and offered him an "in person", "face-to-face" appointment versus a telephone encounter. He indicated preferring the telephone encounter, at this time.   Reason for Virtual Visit: COVID-19*  Social distancing based on CDC and AMA recommendations.   I contacted Derek [name] on 04/26/2019 via telephone.      I clearly identified myself as Derek Done, MD. I verified that I was speaking with the correct person using two identifiers (Name: Derek [name], and date of birth: [birth year]).  Advanced Informed Consent I sought verbal advanced consent from Derek [name] for virtual visit interactions. I informed Derek [name] of possible security and privacy concerns, risks, and limitations associated with providing "not-in-person" medical evaluation and management services. I also informed Derek [name] of the availability of "in-person" appointments. Finally, I informed him that there would be a charge for the virtual visit and that he could be  personally, fully or partially, financially responsible for it. Derek [name] expressed understanding and agreed to proceed.   Historic Elements   Derek [name] is a 38 y.o. year old, male patient evaluated today after his last encounter by our practice on 04/12/2019. Derek [name]  has a past medical history of Anxiety, Arthritis, and GERD (gastroesophageal reflux disease). He also  has a past surgical history that includes Back surgery; HAND REIMPLANTED; and Lumbar fusion (11/14). Derek [name]  has a current medication list which includes the following prescription(s): gnp calcium 1200, cyclobenzaprine, gnp vitamin d super strength, oxycodone hcl, oxycodone hcl, oxycodone hcl, oxycodone hcl, and tizanidine. He  reports that he has been smoking. He has a 16.00 pack-year smoking history. He has never used smokeless tobacco. He reports current alcohol use. He reports that he does not use drugs. Derek [name] is allergic to amoxicillin and vicodin [hydrocodone-acetaminophen].   HPI  Today, he is being contacted for both, medication management and a post-procedure assessment.  Once again, the Racz procedure has provided this patient with 100% relief of his lower extremity pain, which was limiting him from doing his job and continuing to have gainful employment.  He is back to working he indicates that he has absolutely no lower extremity pain, his range of motion has increased, his level of function has also increased, and he is no longer limping.  He is still having some low back pain, but this is probably coming from the lumbar facets and the hardware that he has in the lower back and it has nothing to do with the epidural fibrosis that we were treating.  Therefore, I was not really expecting 100% relief of his low back pain, but I am surprised that he did get 50% relief with the Racz procedure.  Since he is still having some low back pain, I have offered to do a diagnostic bilateral lumbar facet block and SI joint block under fluoroscopic guidance and IV sedation.  However, he has had multiple epidural steroid injections and although blocks that have not provided him with any long-term benefit with the exception of this Racz procedure.  In fact, today he mentioned that he  is really concerned that his insurance company is refusing to pay for the procedure and they are calling it "experimental".  Unfortunately, this is game that they play since this type of procedure is well known to not be experimental,  within the advanced interventional pain management community.  However, that has never stopped [name] from calling well-established treatments "experimental".  Post-Procedure Evaluation  Procedure: Palliative midline Racz procedure #3 under fluoroscopic guidance and IV sedation Pre-procedure pain level:  1/10 Post-procedure: 0/10 (100% relief)  Sedation: Sedation provided.  Effectiveness during initial hour after procedure(Ultra-Short Term Relief): 100 %   Local anesthetic used: Long-acting (4-6 hours) Effectiveness: Defined as any analgesic benefit obtained secondary to the administration of local anesthetics. This carries significant diagnostic value as to the etiological location, or anatomical origin, of the pain. Duration of benefit is expected to coincide with the duration of the local anesthetic used.  Effectiveness during initial 4-6 hours after procedure(Short-Term Relief): 100 %   Long-term benefit: Defined as any relief past the pharmacologic duration of the local anesthetics.  Effectiveness past the initial 6 hours after procedure(Long-Term Relief): 50 % (leg pain is 100 percent)   Current benefits: Defined as benefit that persist at this time.   Analgesia:  100% relief of the lower extremity pain, which was our goal.  50% relief of the patient's lower back pain, which was incidental. Function: Derek [name] reports improvement in function ROM: Derek [name] reports improvement in ROM  Pharmacotherapy Assessment  Analgesic: OxycodoneIR10 mg, 1 tab PO q 6 hrs (40 mg/day of oxycodone) MME/day:60mg /day.   Monitoring: Pharmacotherapy: No side-effects or adverse reactions reported. [place] PMP: PDMP reviewed during this encounter.       Compliance: No problems identified. Effectiveness: Clinically acceptable. Plan: Refer to "POC".  UDS:  Summary  Date Value Ref Range Status  01/16/2019 Note  Final    Comment:     ==================================================================== [employer] Select 13 (MW) ==================================================================== Test                             Result       Flag       Units Drug Present and Declared for Prescription Verification   Oxycodone                      834          EXPECTED   ng/mg creat   Oxymorphone                    4086         EXPECTED   ng/mg creat   Noroxycodone                   1565         EXPECTED   ng/mg creat   Noroxymorphone                 701          EXPECTED   ng/mg creat    Sources of oxycodone are scheduled prescription medications.    Oxymorphone, noroxycodone, and noroxymorphone are expected    metabolites of oxycodone. Oxymorphone is also available as a    scheduled prescription medication. ==================================================================== Test                      Result    Flag   Units  Ref Range   Creatinine              88               mg/dL      >=20 ==================================================================== Declared Medications:  The flagging and interpretation on this report are based on the  following declared medications.  Unexpected results may arise from  inaccuracies in the declared medications.  **Note: The testing scope of this panel includes these medications:  Oxycodone  **Note: The testing scope of this panel does not include the  following reported medications:  Calcium  Cyclobenzaprine (Flexeril)  Magnesium (Mag-Ox)  Tizanidine (Zanaflex)  Vitamin D  Vitamin D3 ==================================================================== For clinical consultation, please call [phone]. ====================================================================    Laboratory Chemistry Profile (12 mo)  Renal: 01/16/2019: BUN 11; BUN/Creatinine Ratio 10; Creatinine, Ser 1.08  Lab Results  Component Value Date   GFRAA 99 01/16/2019   GFRNONAA 86  01/16/2019   Hepatic: 01/16/2019: Albumin 4.6 Lab Results  Component Value Date   AST 24 01/16/2019   Other: 01/16/2019: 25-Hydroxy, Vitamin D 39; 25-Hydroxy, Vitamin D-2 1.2; 25-Hydroxy, Vitamin D-3 38; CRP 4; Sed Rate 29; Vitamin B-12 399 Note: Above Lab results reviewed.  Imaging  Last 90 days:  Dg Pain Clinic C-arm 1-60 Min No Report  Result Date: 04/11/2019 Fluoro was used, but no Radiologist interpretation will be provided. Please refer to "NOTES" tab for provider progress note.   Assessment  The primary encounter diagnosis was Chronic pain syndrome. Diagnoses of Chronic low back pain (Primary Area of Pain) (Bilateral) (L>R) w/ sciatica (Bilateral), Chronic lower extremity pain (Secondary Area of Pain) (Bilateral) (L>R), Failed back surgical syndrome (x3), Epidural fibrosis, Chronic musculoskeletal pain, Spasm of back muscles, Vitamin D insufficiency, Lumbar facet syndrome (Bilateral), and Chronic sacroiliac joint pain (Bilateral) were also pertinent to this visit.  Plan of Care  I have discontinued Korvin Valentine. Kuck's meloxicam. I am also having him start on Oxycodone HCl. Additionally, I am having him maintain his GNP Calcium 1200, Oxycodone HCl, Oxycodone HCl, GNP Vitamin D Super Strength, tiZANidine, cyclobenzaprine, and Oxycodone HCl.  Pharmacotherapy (Medications Ordered): Meds ordered this encounter  Medications  . tiZANidine (ZANAFLEX) 4 MG tablet    Sig: Take 1 tablet (4 mg total) by mouth 2 (two) times daily as needed for muscle spasms (For day-time muscle pain/spasm). Must last 60 days    Dispense:  120 tablet    Refill:  0    Fill one day early if pharmacy is closed on scheduled refill date. May substitute for generic if available.  . cyclobenzaprine (FLEXERIL) 10 MG tablet    Sig: Take 1 tablet (10 mg total) by mouth at bedtime. Must last 60 days    Dispense:  90 tablet    Refill:  0    Fill one day early if pharmacy is closed on scheduled refill date. May  substitute for generic if available.  . Oxycodone HCl 10 MG TABS    Sig: Take 1 tablet (10 mg total) by mouth every 6 (six) hours as needed (Only if needed, not around-the-clock). Must last 30 days    Dispense:  120 tablet    Refill:  0    Chronic Pain: STOP Act (Not applicable) Fill 1 day early if closed on refill date. Do not fill until: 06/26/2019. To last until: 07/26/2019. Avoid benzodiazepines within 8 hours of opioids  . Oxycodone HCl 10 MG TABS    Sig: Take 1 tablet (10 mg total) by  mouth every 6 (six) hours as needed (Only if needed, not around-the-clock). Must last 30 days    Dispense:  120 tablet    Refill:  0    Chronic Pain: STOP Act (Not applicable) Fill 1 day early if closed on refill date. Do not fill until: 07/26/2019. To last until: 08/25/2019. Avoid benzodiazepines within 8 hours of opioids   Orders:  No orders of the defined types were placed in this encounter.  Follow-up plan:   Return in about 17 weeks (around 08/23/2019) for (VV), (MM), in addition, PRN Procedure(s): (B) L-FCT BLK and/or (B) SI BLK.      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, S3 area) (last [name] on 04/11/2019)    Recent Visits Date Type Provider Dept  04/11/19 Procedure visit Delano MetzNaveira, Laia Wiley, MD [facility]  03/23/19 Office Visit Delano MetzNaveira, Amardeep Beckers, MD [facility]  03/20/19 Office Visit Delano MetzNaveira, Skyler Carel, MD [facility]  Showing recent visits within past 90 days and meeting all other requirements   Today's Visits Date Type Provider Dept  04/26/19 Office Visit Delano MetzNaveira, Terena Bohan, MD [facility]  Showing today's visits and meeting all other  requirements   Future Appointments Date Type Provider Dept  06/26/19 Appointment Delano MetzNaveira, Airen Dales, MD [facility]  Showing future appointments within next 90 days and meeting all other requirements   I discussed the assessment and treatment plan with the patient. The patient was provided an opportunity to ask questions and all were answered. The patient agreed with the plan and demonstrated an understanding of the instructions.  Patient advised to call back or seek an in-person evaluation if the symptoms or condition worsens.  Total duration of non-face-to-face encounter: 13 minutes.  Note by: Derek DoneFrancisco A Erendida Wrenn, MD Date: 04/26/2019; Time: 3:30 PM  Note: This dictation was prepared with Dragon dictation. Any transcriptional errors that may result from this process are unintentional.  Disclaimer:  * Given the special circumstances of the COVID-19 pandemic, the federal government has announced that the Office for Civil Rights (OCR) will exercise its enforcement discretion and will not impose penalties on physicians using telehealth in the event of noncompliance with regulatory requirements under the [employer] Insurance Portability and Accountability Act (HIPAA) in connection with the good faith provision of telehealth during the COVID-19 national public health emergency. (AMA)

## 2019-06-21 ENCOUNTER — Encounter: Payer: Self-pay | Admitting: Pain Medicine

## 2019-06-25 NOTE — Progress Notes (Signed)
NOTE: Information contained herein reflects provider's review and annotations entered in association with encounter. Patient information is provided elsewhere in the medical record. Interpretation of information and data should be left to medically trained personnel. Virtual Encounter - Pain Management   Contact & Pharmacy Preferred: [phone] Home: [phone] (home) Mobile: [phone] (mobile) E-mail: [email or web address]  [facility] - [place], [place] - [address] [place] [address] Phone: [phone] Fax: [phone]    Pre-screening  Derek [name] offered "in-person" vs "virtual" encounter. He indicated preferring virtual for this encounter.   Reason COVID-19*  Social distancing based on CDC and AMA recommendations.   I contacted Derek PoreJeffrey M [name] on 06/26/2019 via telephone.      I clearly identified myself as Derek DoneFrancisco A Enrique Weiss, MD. I verified that I was speaking with the correct person using two identifiers (Name: Derek [name], and date of birth: 04/02/1981).  Consent I sought verbal advanced consent from Derek [name] for virtual visit interactions. I informed Derek [name] of possible security and privacy concerns, risks, and limitations associated with providing "not-in-person" medical evaluation and management services. I also informed Derek [name] of the availability of "in-person" appointments. Finally, I informed him that there would be a charge for the virtual visit and that he could be  personally, fully or partially, financially responsible for it. Derek [name] expressed understanding and agreed to proceed.   Historic Elements   Derek [name] is a 38 y.o. year old, male patient evaluated today after his last encounter by our practice on 04/26/2019. Derek [name]  has a past medical history of Anxiety, Arthritis, and GERD (gastroesophageal reflux disease). He also  has a past surgical history that includes Back surgery; HAND REIMPLANTED;  and Lumbar fusion (11/14). Derek [name] has a current medication list which includes the following prescription(s): gnp calcium 1200, cyclobenzaprine, gnp vitamin d super strength, oxycodone hcl, [START ON 06/26/2019] oxycodone hcl, [START ON 07/26/2019] oxycodone hcl, tizanidine, and oxycodone hcl. He  reports that he has been smoking. He has a 16.00 pack-year smoking history. He has never used smokeless tobacco. He reports current alcohol use. He reports that he does not use drugs. Derek [name] is allergic to amoxicillin and vicodin [hydrocodone-acetaminophen].   HPI  Today, he is being contacted for medication management. The patient indicates doing well with the current medication regimen. No adverse reactions or side effects reported to the medications.  He refers having some pain that he thinks is arthritis and he typically takes Aleve for it but he wonders if there is something that may be better and safer for him.  I have recommended doing a trial of meloxicam and he agreed with it.  We will go ahead and send a prescription to her pharmacy and then we will review the results on his next appointment.  Pharmacotherapy Assessment  Analgesic: OxycodoneIR10 mg, 1 tab PO q 6 hrs (40 mg/day of oxycodone) MME/day:60mg /day.   Monitoring: Pharmacotherapy: No side-effects or adverse reactions reported. [place] PMP: PDMP reviewed during this encounter.       Compliance: No problems identified. Effectiveness: Clinically acceptable. Plan: Refer to "POC".  UDS:  Summary  Date Value Ref Range Status  01/16/2019 Note  Final    Comment:    ==================================================================== [employer] Select 13 (MW) ==================================================================== Test                             Result  Flag       Units Drug Present and Declared for Prescription Verification   Oxycodone                      834          EXPECTED   ng/mg creat   Oxymorphone                     4086         EXPECTED   ng/mg creat   Noroxycodone                   1565         EXPECTED   ng/mg creat   Noroxymorphone                 701          EXPECTED   ng/mg creat    Sources of oxycodone are scheduled prescription medications.    Oxymorphone, noroxycodone, and noroxymorphone are expected    metabolites of oxycodone. Oxymorphone is also available as a    scheduled prescription medication. ==================================================================== Test                      Result    Flag   Units      Ref Range   Creatinine              88               mg/dL      >=20 ==================================================================== Declared Medications:  The flagging and interpretation on this report are based on the  following declared medications.  Unexpected results may arise from  inaccuracies in the declared medications.  **Note: The testing scope of this panel includes these medications:  Oxycodone  **Note: The testing scope of this panel does not include the  following reported medications:  Calcium  Cyclobenzaprine (Flexeril)  Magnesium (Mag-Ox)  Tizanidine (Zanaflex)  Vitamin D  Vitamin D3 ==================================================================== For clinical consultation, please call [phone]. ====================================================================    Laboratory Chemistry Profile (12 mo)  Renal: 01/16/2019: BUN 11; BUN/Creatinine Ratio 10; Creatinine, Ser 1.08  Lab Results  Component Value Date   GFRAA 99 01/16/2019   GFRNONAA 86 01/16/2019   Hepatic: 01/16/2019: Albumin 4.6 Lab Results  Component Value Date   AST 24 01/16/2019   Other: 01/16/2019: 25-Hydroxy, Vitamin D 39; 25-Hydroxy, Vitamin D-2 1.2; 25-Hydroxy, Vitamin D-3 38; CRP 4; Sed Rate 29; Vitamin B-12 399 Note: Above Lab results reviewed.  Imaging  DG PAIN CLINIC C-ARM 1-60 MIN NO REPORT Fluoro was used, but no Radiologist interpretation will  be provided.  Please refer to "NOTES" tab for provider progress note.   Assessment  The primary encounter diagnosis was Chronic pain syndrome. Diagnoses of Chronic low back pain (Primary Area of Pain) (Bilateral) (L>R) w/ sciatica (Bilateral) and Chronic lower extremity pain (Secondary Area of Pain) (Bilateral) (L>R) were also pertinent to this visit.  Plan of Care  Problem-specific:  No problem-specific Assessment & Plan notes found for this encounter.  I am having Derek [name] maintain his GNP Calcium 1200, Oxycodone HCl, Oxycodone HCl, GNP Vitamin D Super Strength, tiZANidine, cyclobenzaprine, Oxycodone HCl, and Oxycodone HCl.  Pharmacotherapy (Medications Ordered): No orders of the defined types were placed in this encounter.  Orders:  No orders of the defined types were placed in this encounter.  Follow-up plan:   No follow-ups on file.  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, S3 area) (last done on 04/11/2019)     Recent Visits Date Type Provider Dept  04/26/19 Office Visit Delano Metz, MD [facility]  04/11/19 Procedure visit Delano Metz, MD [facility]  Showing recent visits within past 90 days and meeting all other requirements   Future Appointments Date Type Provider Dept  06/26/19 Telemedicine Delano Metz, MD [facility]  08/21/19 Appointment Delano Metz, MD [facility]  Showing future appointments within next 90 days and meeting all other requirements   I discussed the assessment and treatment plan with the patient. The patient was provided an opportunity to ask questions and all  were answered. The patient agreed with the plan and demonstrated an understanding of the instructions.  Patient advised to call back or seek an in-person evaluation if the symptoms or condition worsens.  Total duration of non-face-to-face encounter: 15 minutes.  Note by: Derek Done, MD Date: 06/26/2019; Time: 6:00 PM  Note: This dictation was prepared with Dragon dictation. Any transcriptional errors that may result from this process are unintentional.

## 2019-06-26 ENCOUNTER — Ambulatory Visit: Payer: BC Managed Care – PPO | Attending: Pain Medicine | Admitting: Pain Medicine

## 2019-06-26 ENCOUNTER — Other Ambulatory Visit: Payer: Self-pay

## 2019-06-26 DIAGNOSIS — M7918 Myalgia, other site: Secondary | ICD-10-CM

## 2019-06-26 DIAGNOSIS — M5442 Lumbago with sciatica, left side: Secondary | ICD-10-CM | POA: Diagnosis not present

## 2019-06-26 DIAGNOSIS — M5441 Lumbago with sciatica, right side: Secondary | ICD-10-CM | POA: Diagnosis not present

## 2019-06-26 DIAGNOSIS — G8929 Other chronic pain: Secondary | ICD-10-CM

## 2019-06-26 DIAGNOSIS — G894 Chronic pain syndrome: Secondary | ICD-10-CM | POA: Diagnosis not present

## 2019-06-26 DIAGNOSIS — M62830 Muscle spasm of back: Secondary | ICD-10-CM

## 2019-06-26 DIAGNOSIS — M47896 Other spondylosis, lumbar region: Secondary | ICD-10-CM

## 2019-06-26 DIAGNOSIS — M79605 Pain in left leg: Secondary | ICD-10-CM

## 2019-06-26 MED ORDER — TIZANIDINE HCL 4 MG PO TABS: 4.0000 mg | ORAL_TABLET | Freq: Two times a day (BID) | ORAL | 5 refills | Status: DC | PRN

## 2019-06-26 MED ORDER — MELOXICAM 15 MG PO TABS: 15.0000 mg | ORAL_TABLET | Freq: Every day | ORAL | 2 refills | Status: DC

## 2019-06-26 MED ORDER — CYCLOBENZAPRINE HCL 10 MG PO TABS: 10.0000 mg | ORAL_TABLET | Freq: Every day | ORAL | 1 refills | Status: DC

## 2019-06-26 MED ORDER — OXYCODONE HCL 10 MG PO TABS: 10.0000 mg | ORAL_TABLET | Freq: Four times a day (QID) | ORAL | 0 refills | Status: DC | PRN

## 2019-06-26 NOTE — Patient Instructions (Signed)
Meloxicam capsules What is this medicine? MELOXICAM (mel OX i cam) is a non-steroidal anti-inflammatory drug (NSAID). It is used to reduce swelling and to treat pain. It is used for osteoarthritis. This medicine may be used for other purposes; ask your health care provider or pharmacist if you have questions. COMMON BRAND NAME(S): Vivlodex What should I tell my health care provider before I take this medicine? They need to know if you have any of these conditions:  bleeding disorders  cigarette smoker  coronary artery bypass graft (CABG) surgery within the past 2 weeks  drink more than 3 alcohol-containing drinks per day  heart disease  high blood pressure  history of stomach bleeding  kidney disease  liver disease  lung or breathing disease, like asthma  stomach or intestine problems  an unusual or allergic reaction to meloxicam, aspirin, other NSAIDs, other medicines, foods, dyes, or preservatives  pregnant or trying to get pregnant  breast-feeding How should I use this medicine? Take this medicine by mouth with a full glass of water. Follow the directions on the prescription label. You can take it with or without food. If it upsets your stomach, take it with food. Take your medicine at regular intervals. Do not take it more often than directed. Do not stop taking except on your doctor's advice. A special MedGuide will be given to you by the pharmacist with each prescription and refill. Be sure to read this information carefully each time. Talk to your pediatrician regarding the use of this medicine in children. Special care may be needed. Patients over 65 years old may have a stronger reaction and need a smaller dose. Overdosage: If you think you have taken too much of this medicine contact a poison control center or emergency room at once. NOTE: This medicine is only for you. Do not share this medicine with others. What if I miss a dose? If you miss a dose, take it as  soon as you can. If it is almost time for your next dose, take only that dose. Do not take double or extra doses. What may interact with this medicine? Do not take this medicine with any of the following medications:  cidofovir  ketorolac This medicine may also interact with the following medications:  aspirin and aspirin-like medicines  certain medicines for blood pressure, heart disease, irregular heart beat  certain medicines for depression, anxiety, or psychotic disturbances  certain medicines that treat or prevent blood clots like warfarin, enoxaparin, dalteparin, apixaban, dabigatran, rivaroxaban  cyclosporine  diuretics  fluconazole  lithium  methotrexate  other NSAIDs, medicines for pain and inflammation, like ibuprofen and naproxen  pemetrexed This list may not describe all possible interactions. Give your health care provider a list of all the medicines, herbs, non-prescription drugs, or dietary supplements you use. Also tell them if you smoke, drink alcohol, or use illegal drugs. Some items may interact with your medicine. What should I watch for while using this medicine? Tell your doctor or healthcare provider if your symptoms do not start to get better or if they get worse. This medicine may cause serious skin reactions. They can happen weeks to months after starting the medicine. Contact your healthcare provider right away if you notice fevers or flu-like symptoms with a rash. The rash may be red or purple and then turn into blisters or peeling of the skin. Or, you might notice a red rash with swelling of the face, lips or lymph nodes in your neck or under your   arms. Do not take other medicines that contain aspirin, ibuprofen, or naproxen with this medicine. Side effects such as stomach upset, nausea, or ulcers may be more likely to occur. Many medicines available without a prescription should not be taken with this medicine. This medicine can cause ulcers and  bleeding in the stomach and intestines at any time during treatment. This can happen with no warning and may cause death. There is increased risk with taking this medicine for a long time. Smoking, drinking alcohol, older age, and poor health can also increase risks. Call your doctor right away if you have stomach pain or blood in your vomit or stool. This medicine does not prevent heart attack or stroke. In fact, this medicine may increase the chance of a heart attack or stroke. The chance may increase with longer use of this medicine and in people who have heart disease. If you take aspirin to prevent heart attack or stroke, talk with your doctor or healthcare provider. What side effects may I notice from receiving this medicine? Side effects that you should report to your doctor or health care professional as soon as possible:  allergic reactions like skin rash, itching or hives, swelling of the face, lips, or tongue  nausea, vomiting  redness, blistering, peeling, or loosening of the skin, including inside the mouth  signs and symptoms of a blood clot such as breathing problems; changes in vision; chest pain; severe, sudden headache; pain, swelling, warmth in the leg; trouble speaking; sudden numbness or weakness of the face, arm, or leg  signs and symptoms of bleeding such as bloody or black, tarry stools; red or dark-brown urine; spitting up blood or brown material that looks like coffee grounds; red spots on the skin; unusual bruising or bleeding from the eye, gums, or nose  signs and symptoms of liver injury like dark yellow or brown urine; general ill feeling or flu-like symptoms; light-colored stools; loss of appetite; nausea; right upper belly pain; unusually weak or tired; yellowing of the eyes or skin  signs and symptoms of stroke like changes in vision; confusion; trouble speaking or understanding; severe headaches; sudden numbness or weakness of the face, arm, or leg; trouble walking;  dizziness; loss of balance or coordination Side effects that usually do not require medical attention (report to your doctor or health care professional if they continue or are bothersome):  constipation  diarrhea  gas This list may not describe all possible side effects. Call your doctor for medical advice about side effects. You may report side effects to FDA at 1-800-FDA-1088. Where should I keep my medicine? Keep out of the reach of children. Store at room temperature between 15 and 30 degrees C (59 and 86 degrees F). Throw away any unused medicine after the expiration date. NOTE: This sheet is a summary. It may not cover all possible information. If you have questions about this medicine, talk to your doctor, pharmacist, or health care provider.  2020 [employer]/Gold Standard (2018-09-21 11:19:03) __________________________________________________________________________________________________________________   

## 2019-08-21 ENCOUNTER — Telehealth: Payer: BC Managed Care – PPO | Admitting: Pain Medicine

## 2019-09-11 ENCOUNTER — Telehealth: Payer: Self-pay

## 2019-09-11 NOTE — Telephone Encounter (Signed)
Please do this

## 2019-09-11 NOTE — Telephone Encounter (Signed)
He would like another RACZ procedure. Please put order in if this is ok

## 2019-09-12 ENCOUNTER — Other Ambulatory Visit: Payer: Self-pay | Admitting: Pain Medicine

## 2019-09-12 NOTE — Telephone Encounter (Signed)
There are standing orders for it. Use them.

## 2019-09-13 ENCOUNTER — Telehealth: Payer: Self-pay

## 2019-09-13 NOTE — Telephone Encounter (Signed)
Pharmacy notified order sheet placed on desk for Dr Laban Emperor.

## 2019-09-13 NOTE — Telephone Encounter (Signed)
Scheduled RACZ for 09/21/19 at 0815. I notified the pharmacy. Please send paper copy of order down to pharmacy.

## 2019-09-13 NOTE — Telephone Encounter (Signed)
Please schedule

## 2019-09-19 ENCOUNTER — Telehealth: Payer: Self-pay | Admitting: *Deleted

## 2019-09-19 ENCOUNTER — Telehealth: Payer: Self-pay

## 2019-09-19 NOTE — Progress Notes (Signed)
Unsuccessful attempt to contact patient for Virtual Visit (Pain Management Telehealth)   Patient provided contact information:  [phone] (home); [phone] (mobile); (Preferred) [phone] [email or web address]   Pre-screening:  Our staff was successful in contacting Derek [name] using the above provided information.   I unsuccessfully attempted to make contact with Derek [name] on 09/20/2019 via telephone. I was unable to complete the virtual encounter due to call going directly to voicemail. I was able to leave a message where I clearly identify myself as Oswaldo Done, MD and I left a message to call us back to reschedule the call.  Pharmacotherapy Assessment  Analgesic: OxycodoneIR10 mg, 1 tab PO q 6 hrs (40 mg/day of oxycodone) MME/day:60mg /day.   Follow-up plan:   Reschedule Visit.     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, S3 area) (last done on 04/11/2019)      Recent Visits Date Type Provider Dept  06/26/19 Telemedicine Delano Metz, MD [facility]  Showing recent visits within past 90 days and meeting all other requirements   Today's Visits Date Type Provider Dept  09/20/19 Telemedicine Delano Metz, MD [facility]  Showing today's visits and meeting all other requirements   Future Appointments Date Type Provider Dept  09/21/19 Appointment Delano Metz, MD [facility]  Showing future appointments within next 90 days and meeting all other requirements    Note by: Oswaldo Done, MD Date: 09/20/2019; Time: 2:40 PM

## 2019-09-19 NOTE — Telephone Encounter (Signed)
completed

## 2019-09-19 NOTE — Telephone Encounter (Signed)
Voicemail picked up on the first ring. Left message to call us back to review alleg/meds for 09/20/19 VV.

## 2019-09-19 NOTE — Telephone Encounter (Signed)
Called and reviewed medications for virtual visit.

## 2019-09-20 ENCOUNTER — Ambulatory Visit: Payer: BC Managed Care – PPO | Attending: Pain Medicine | Admitting: Pain Medicine

## 2019-09-20 ENCOUNTER — Other Ambulatory Visit: Payer: Self-pay

## 2019-09-20 DIAGNOSIS — Z5329 Procedure and treatment not carried out because of patient's decision for other reasons: Secondary | ICD-10-CM

## 2019-09-20 DIAGNOSIS — M961 Postlaminectomy syndrome, not elsewhere classified: Secondary | ICD-10-CM

## 2019-09-20 DIAGNOSIS — G894 Chronic pain syndrome: Secondary | ICD-10-CM

## 2019-09-20 DIAGNOSIS — G96198 Other disorders of meninges, not elsewhere classified: Secondary | ICD-10-CM

## 2019-09-20 DIAGNOSIS — M47896 Other spondylosis, lumbar region: Secondary | ICD-10-CM

## 2019-09-20 DIAGNOSIS — G8929 Other chronic pain: Secondary | ICD-10-CM

## 2019-09-21 ENCOUNTER — Encounter: Payer: Self-pay | Admitting: Pain Medicine

## 2019-09-21 ENCOUNTER — Other Ambulatory Visit: Payer: Self-pay

## 2019-09-21 ENCOUNTER — Ambulatory Visit (HOSPITAL_BASED_OUTPATIENT_CLINIC_OR_DEPARTMENT_OTHER): Payer: BC Managed Care – PPO | Admitting: Pain Medicine

## 2019-09-21 ENCOUNTER — Ambulatory Visit
Admission: RE | Admit: 2019-09-21 | Discharge: 2019-09-21 | Disposition: A | Discharge status: Home / Self Care | Payer: BC Managed Care – PPO | Source: Ambulatory Visit | Attending: Pain Medicine | Admitting: Pain Medicine

## 2019-09-21 VITALS — BP 96/53 | HR 90 | Temp 97.5°F | Resp 15 | Ht 70.0 in | Wt 181.0 lb

## 2019-09-21 DIAGNOSIS — G96198 Other disorders of meninges, not elsewhere classified: Secondary | ICD-10-CM | POA: Insufficient documentation

## 2019-09-21 DIAGNOSIS — M961 Postlaminectomy syndrome, not elsewhere classified: Secondary | ICD-10-CM

## 2019-09-21 DIAGNOSIS — G8929 Other chronic pain: Secondary | ICD-10-CM

## 2019-09-21 DIAGNOSIS — M5442 Lumbago with sciatica, left side: Secondary | ICD-10-CM | POA: Insufficient documentation

## 2019-09-21 DIAGNOSIS — M47896 Other spondylosis, lumbar region: Secondary | ICD-10-CM | POA: Insufficient documentation

## 2019-09-21 DIAGNOSIS — M79605 Pain in left leg: Secondary | ICD-10-CM | POA: Diagnosis present

## 2019-09-21 DIAGNOSIS — M5441 Lumbago with sciatica, right side: Secondary | ICD-10-CM | POA: Insufficient documentation

## 2019-09-21 DIAGNOSIS — G894 Chronic pain syndrome: Secondary | ICD-10-CM | POA: Diagnosis present

## 2019-09-21 DIAGNOSIS — M960 Pseudarthrosis after fusion or arthrodesis: Secondary | ICD-10-CM | POA: Diagnosis present

## 2019-09-21 DIAGNOSIS — M79604 Pain in right leg: Secondary | ICD-10-CM | POA: Diagnosis present

## 2019-09-21 MED ORDER — SODIUM CHLORIDE 0.9% FLUSH
4.0000 mL | Freq: Once | INTRAVENOUS | Status: AC
  Administered 2019-09-21: 4 mL

## 2019-09-21 MED ORDER — HYALURONIDASE HUMAN 150 UNIT/ML IJ SOLN
1500.0000 [IU] | Freq: Once | INTRAMUSCULAR | Status: AC
  Administered 2019-09-21: 1500 [IU] via INTRADERMAL
  Filled 2019-09-21: qty 10

## 2019-09-21 MED ORDER — IOHEXOL 180 MG/ML  SOLN
10.0000 mL | Freq: Once | INTRAMUSCULAR | Status: AC
  Administered 2019-09-21: 09:00:00 10 mL via EPIDURAL
  Filled 2019-09-21: qty 20

## 2019-09-21 MED ORDER — MELOXICAM 15 MG PO TABS: 15.0000 mg | ORAL_TABLET | Freq: Every day | ORAL | 1 refills | Status: DC

## 2019-09-21 MED ORDER — MIDAZOLAM HCL 5 MG/5ML IJ SOLN
1.0000 mg | INTRAMUSCULAR | Status: DC | PRN
  Administered 2019-09-21 (×2): 1 mg via INTRAVENOUS
  Filled 2019-09-21: qty 5

## 2019-09-21 MED ORDER — LACTATED RINGERS IV SOLN
1000.0000 mL | Freq: Once | INTRAVENOUS | Status: AC
  Administered 2019-09-21: 1000 mL via INTRAVENOUS

## 2019-09-21 MED ORDER — STERILE WATER FOR INJECTION IJ SOLN
10.0000 mL | Freq: Once | INTRAVENOUS | Status: AC
  Administered 2019-09-21: 10 mL via EPIDURAL
  Filled 2019-09-21: qty 4.27

## 2019-09-21 MED ORDER — LIDOCAINE-EPINEPHRINE (PF) 2 %-1:200000 IJ SOLN
10.0000 mL | Freq: Once | INTRAMUSCULAR | Status: AC
  Administered 2019-09-21: 20 mL
  Filled 2019-09-21: qty 20

## 2019-09-21 MED ORDER — ROPIVACAINE HCL 5 MG/ML IJ SOLN
5.0000 mL | Freq: Once | INTRAMUSCULAR | Status: AC
  Administered 2019-09-21: 5 mL via EPIDURAL
  Filled 2019-09-21: qty 5

## 2019-09-21 MED ORDER — ROPIVACAINE HCL 2 MG/ML IJ SOLN
INTRAMUSCULAR | Status: AC
  Filled 2019-09-21: qty 10

## 2019-09-21 MED ORDER — TRIAMCINOLONE ACETONIDE 40 MG/ML IJ SUSP
40.0000 mg | Freq: Once | INTRAMUSCULAR | Status: AC
  Administered 2019-09-21: 09:00:00 40 mg
  Filled 2019-09-21: qty 1

## 2019-09-21 MED ORDER — OXYCODONE HCL 10 MG PO TABS: 10.0000 mg | ORAL_TABLET | Freq: Four times a day (QID) | ORAL | 0 refills | Status: DC | PRN

## 2019-09-21 MED ORDER — FENTANYL CITRATE (PF) 100 MCG/2ML IJ SOLN
25.0000 ug | INTRAMUSCULAR | Status: DC | PRN
  Administered 2019-09-21: 50 ug via INTRAVENOUS
  Filled 2019-09-21: qty 2

## 2019-09-21 MED ORDER — LIDOCAINE HCL 2 % IJ SOLN
20.0000 mL | Freq: Once | INTRAMUSCULAR | Status: AC
  Administered 2019-09-21: 400 mg
  Filled 2019-09-21: qty 20

## 2019-09-21 NOTE — Progress Notes (Signed)
PROVIDER NOTE: Information contained herein reflects review and annotations entered in association with encounter. Interpretation of such information and data should be left to medically-trained personnel. Information provided to patient can be located elsewhere in the medical record under "Patient Instructions". Document created using STT-dictation technology, any transcriptional errors that may result from process are unintentional.    Patient: Derek [name]  Service Category: Procedure  Provider: Gaspar Cola, MD  DOB: 07-Nov-1980  DOS: 09/21/2019  Location: [facility]  MRN: [id number]  Setting: Ambulatory - outpatient  Referring Provider: Jodi Marble, MD  Type: Established Patient  Specialty: Interventional Pain Management  PCP: Jodi Marble, MD   Primary Reason for Visit: Interventional Pain Management Treatment. CC: Back Pain (lumbar bilateral left is worse ) and Leg Pain (left )  Procedure:          Anesthesia, Analgesia, Anxiolysis:  Type: Therapeutic Percutaneous Epidural Neuroplasty and Lysis of Adhesions (RACZ Procedure)  #4  Region: Caudal Level: Sacrococcygeal   Laterality: Midline aiming at the left (targeting the left S2, S3 area)  Type: Moderate (Conscious) Sedation combined with Local Anesthesia Indication(s): Analgesia and Anxiety Route: Intravenous (IV) IV Access: Secured Sedation: Meaningful verbal contact was maintained at all times during the procedure  Local Anesthetic: Lidocaine 1-2%  Position: Prone   Indications: 1. Failed back surgical syndrome (x3)   2. Epidural fibrosis   3. L5-S1 pseudoarthrosis   4. Chronic low back pain (Primary Area of Pain) (Bilateral) (L>R) w/ sciatica (Bilateral)   5. Chronic lower extremity pain (Secondary Area of Pain) (Bilateral) (L>R)   6. Osteoarthritic spondylosis of lumbar spine   7. Chronic pain syndrome    Pain Score: Pre-procedure: 3 /10 Post-procedure: 0-No pain/10   Pharmacotherapy  Assessment  Analgesic: OxycodoneIR10 mg, 1 tab PO q 6 hrs (40 mg/day of oxycodone) MME/day:'60mg'$ /day.   Monitoring: [place] PMP: PDMP reviewed during this encounter.       Pharmacotherapy: No side-effects or adverse reactions reported. Compliance: No problems identified. Effectiveness: Clinically acceptable. Plan: Refer to "POC".  UDS:  Summary  Date Value Ref Range Status  01/16/2019 Note  Final    Comment:    ==================================================================== [employer] Select 13 (MW) ==================================================================== Test                             Result       Flag       Units Drug Present and Declared for Prescription Verification   Oxycodone                      834          EXPECTED   ng/mg creat   Oxymorphone                    4086         EXPECTED   ng/mg creat   Noroxycodone                   1565         EXPECTED   ng/mg creat   Noroxymorphone                 701          EXPECTED   ng/mg creat    Sources of oxycodone are scheduled prescription medications.    Oxymorphone, noroxycodone, and noroxymorphone are expected    metabolites of oxycodone.  Oxymorphone is also available as a    scheduled prescription medication. ==================================================================== Test                      Result    Flag   Units      Ref Range   Creatinine              88               mg/dL      >=20 ==================================================================== Declared Medications:  The flagging and interpretation on this report are based on the  following declared medications.  Unexpected results may arise from  inaccuracies in the declared medications.  **Note: The testing scope of this panel includes these medications:  Oxycodone  **Note: The testing scope of this panel does not include the  following reported medications:  Calcium  Cyclobenzaprine (Flexeril)  Magnesium (Mag-Ox)  Tizanidine  (Zanaflex)  Vitamin D  Vitamin D3 ==================================================================== For clinical consultation, please call [phone]. ====================================================================     Pre-op Assessment:  Mr. Turvey is a 39 y.o. (year old), male patient, seen today for interventional treatment. He  has a past surgical history that includes Back surgery; HAND REIMPLANTED; and Lumbar fusion (11/14). Mr. Copen has a current medication list which includes the following prescription(s): gnp calcium 1200, cyclobenzaprine, gnp vitamin d super strength, magnesium oxide, meloxicam, [START ON 09/24/2019] oxycodone hcl, [START ON 10/24/2019] oxycodone hcl, [START ON 11/23/2019] oxycodone hcl, and tizanidine, and the following Facility-Administered Medications: fentanyl and midazolam. His primarily concern today is the Back Pain (lumbar bilateral left is worse ) and Leg Pain (left )  Initial Vital Signs:  Pulse/HCG Rate: 90ECG Heart Rate: 80 Temp: 97.7 F (36.5 C) Resp: 16 BP: 128/85 SpO2: 100 %  BMI: Estimated body mass index is 25.97 kg/m as calculated from the following:   Height as of this encounter: '5\' 10"'$  (1.778 m).   Weight as of this encounter: 181 lb (82.1 kg).  Risk Assessment: Allergies: Reviewed. He is allergic to amoxicillin and vicodin [hydrocodone-acetaminophen].  Allergy Precautions: None required Coagulopathies: Reviewed. None identified.  Blood-thinner therapy: None at this time Active Infection(s): Reviewed. None identified. Mr. Zuckerman is afebrile  Site Confirmation: Mr. Norgaard was asked to confirm the procedure and laterality before marking the site Procedure checklist: Completed Consent: Before the procedure and under the influence of no sedative(s), amnesic(s), or anxiolytics, the patient was informed of the treatment options, risks and possible complications. To fulfill our ethical and legal obligations, as recommended by the  American Medical Association's Code of Ethics, I have informed the patient of my clinical impression; the nature and purpose of the treatment or procedure; the risks, benefits, and possible complications of the intervention; the alternatives, including doing nothing; the risk(s) and benefit(s) of the alternative treatment(s) or procedure(s); and the risk(s) and benefit(s) of doing nothing. The patient was provided information about the general risks and possible complications associated with the procedure. These may include, but are not limited to: failure to achieve desired goals, infection, bleeding, organ or nerve damage, allergic reactions, paralysis, and death. In addition, the patient was informed of those risks and complications associated to Spine-related procedures, such as failure to decrease pain; infection (i.e.: Meningitis, epidural or intraspinal abscess); bleeding (i.e.: epidural hematoma, subarachnoid hemorrhage, or any other type of intraspinal or peri-dural bleeding); organ or nerve damage (i.e.: Any type of peripheral nerve, nerve root, or spinal cord injury) with subsequent damage to sensory, motor, and/or autonomic systems,  resulting in permanent pain, numbness, and/or weakness of one or several areas of the body; allergic reactions; (i.e.: anaphylactic reaction); and/or death. Furthermore, the patient was informed of those risks and complications associated with the medications. These include, but are not limited to: allergic reactions (i.e.: anaphylactic or anaphylactoid reaction(s)); adrenal axis suppression; blood sugar elevation that in diabetics may result in ketoacidosis or comma; water retention that in patients with history of congestive heart failure may result in shortness of breath, pulmonary edema, and decompensation with resultant heart failure; weight gain; swelling or edema; medication-induced neural toxicity; particulate matter embolism and blood vessel occlusion with  resultant organ, and/or nervous system infarction; and/or aseptic necrosis of one or more joints. Finally, the patient was informed that Medicine is not an exact science; therefore, there is also the possibility of unforeseen or unpredictable risks and/or possible complications that may result in a catastrophic outcome. The patient indicated having understood very clearly. We have given the patient no guarantees and we have made no promises. Enough time was given to the patient to ask questions, all of which were answered to the patient's satisfaction. Mr. Merryfield has indicated that he wanted to continue with the procedure. Attestation: I, the ordering provider, attest that I have discussed with the patient the benefits, risks, side-effects, alternatives, likelihood of achieving goals, and potential problems during recovery for the procedure that I have provided informed consent. Date  Time: 09/21/2019  8:16 AM  Pre-Procedure Preparation:  Monitoring: As per clinic protocol. Respiration, ETCO2, SpO2, BP, heart rate and rhythm monitor placed and checked for adequate function Safety Precautions: Patient was assessed for positional comfort and pressure points before starting the procedure. Time-out: I initiated and conducted the "Time-out" before starting the procedure, as per protocol. The patient was asked to participate by confirming the accuracy of the "Time Out" information. Verification of the correct person, site, and procedure were performed and confirmed by me, the nursing staff, and the patient. "Time-out" conducted as per Joint Commission's Universal Protocol (UP.01.01.01). Time: 0927  Description of Procedure:          Target Area: Caudal Epidural Canal Approach: Midline approach Area Prepped: Entire Posterior Sacrococcygeal Region Prepping solution: DuraPrep (Iodine Povacrylex [0.7% available iodine] and Isopropyl Alcohol, 74% w/w) Safety Precautions: Aspiration looking for blood return was  conducted prior to all injections. At no point did I inject any substances, as a needle was being advanced. No attempts were made at seeking any paresthesias. Safe injection practices and needle disposal techniques used. Medications properly checked for expiration dates. SDV (single dose vial) medications used. Description of the Procedure: Protocol guidelines were followed. The patient was placed in position over the fluoroscopy table. Patient assessed for comfort and pressure points before starting procedure. The target area was identified and the area prepped in the usual manner. Skin & deeper tissues infiltrated with local anesthetic. Appropriate amount of time allowed to pass for local anesthetics to take effect. The epidural needle was then advanced to the target area, via the sacral hiatus, between the sacral cornu. Proper needle placement secured. Negative aspiration confirmed. Step 1: Epidurogram performed by slowly injecting a non-ionic, water-soluble, hypoallergenic, myelogram-compatible radiological contrast. Defect identified and Racz catheter advanced slowly next to rest proximal to it without attempting to perforate or puncture the defect. At this point, the epidural needle was removed. Step 2: Contrast was again injected, this time thru the catheter, under live fluoroscopy, closely observing for vascular uptake or intrathecal spread. Step 3: Once no vascular  uptake or intrathecal spread was confirmed, a 2 mL test-dose using 2% PF-Lidocaine with 1:200,000 Epinephrine was injected thru the catheter, while closely monitoring for an increase in heart rate or evidence of spinal anesthesia. Step 4: After waiting over 5 minutes, the patient was assessed for evidence of a spinal block. Step 5: Once I had confirmed that there was no vascular uptake or evidence of intrathecal spread, I then slowly injected 1,500 Units of hyaluronidase, carefully monitoring for allergic reactions. I then waited 10  minutes, using this time to secure the catheter using a sterile benzoin tincture swab and (53m x 100 mm) steri-strip. After confirming vitals to be stable, the patient was transferred to the recovery area where Mr. JKiralywas kept under constant observation and monitored as per post-sedation protocol. Step 6: After the 10 minutes, I proceeded to slowly inject a solution containing 0.2% MPF-Ropivacaine (4 mL) + 0.9% PF-NSS (5 mL) + SDV Triamcinolone 40 mg/mL (1 mL), in intermittent fashion, asking for systemic symptoms every 0.5cc of injectate. Step 7:  30 minutes later, a neurological exam was conducted for any evidence of a spinal blockade. Step 8:  After confirming the absence of anesthesia, I slowly injected the 10% PF-Hypertonic Saline, stopping to assess, any time the patient described any discomfort. Once the procedure was completed, I removed the catheter, taking time to show the patient its spring tip, and demonstrating to the patient that none had been left behind. EBL: None Materials & Medications used:  1. Racz Tun-L-Kath (Epimed, GRed [place] Catheter. (or similar)(Perifix 20Gx100cm) 2. 2" Foam Tape 3. Benzoin tincture swab 4. Steri-Strip (128mx 100 mm) 5. Non-occlusive Transparent Dressing (3MT TegadermT) 6. Bacteriostatic Filter for Epidural Catheter 7. Epidural Kit for 20G catheter Needle(s) used: 20g - 10cm, Tuohy-style epidural needle   Medications used:  1. Skin infiltration: 2.0% Lidocaine (1035m2. Test-dose: 1.5 % Lidocaine w/ Epi 1:200,000 (5ml38m. Epidural Steroid injection:  A). Steroid: Triamcinolone 40 mg/mL (SDV) (1ml)88m. Local Anesthetic: 0.2% PF-Ropivacaine (2 mg/mL) (4 mL) C). Dilution agent: 0.9% PF-NSS (injectable saline) (5 mL) 6. Neurolytic Agent: 10% PF-Sodium Chloride (Hypertonic Saline) (10ml)7m.4% PF-Sodium Chloride (4.273mL) 43m-Sterile H2O (5.727mL) =31m PF-Sodium Chloride (10mL)] 773mar softening agent: Hyaluronidase (150 units/mL) x (10  mL) = 1500 Units 8. Radiological Contrast Media: Isovue-M 200 (10 mL)  Vitals:   09/21/19 1053 09/21/19 1103 09/21/19 1115 09/21/19 1125  BP: (!) 98/53 (!) 96/54 (!) 99/51 (!) 96/53  Pulse:      Resp: _0 Temp:    (!) 97.5 F (36.4 C)  TempSrc:    Temporal  SpO2: 97% 97% 96% 96%  Weight:      Height:        Start Time: 0927 hrs. End Time:   hrs. Epidurogram:  Epidurogram flow pattern demonstrated a restricted low at the level of the surgery. The epidural catheter was placed next to this restriction without attempting to perforate it.  Today's epidurogram has demonstrated a code of of the flow of contrast in the region of the L5-S1 where he had his prior surgery.  Flow of contrast in that region seem to follow a triangular pattern with very little contrast seen going into the L5 region, bilaterally. Materials:  Needle(s) Type: Epidural needle Gauge: 20G Length: 3.5-in Medication(s): Please see orders for medications and dosing details.  Imaging Guidance (Spinal):          Type of Imaging Technique: Fluoroscopy Guidance (Spinal) Indication(s): Assistance in  needle guidance and placement for procedures requiring needle placement in or near specific anatomical locations not easily accessible without such assistance. Exposure Time: Please see nurses notes. Contrast: Before injecting any contrast, we confirmed that the patient did not have an allergy to iodine, shellfish, or radiological contrast. Once satisfactory needle placement was completed at the desired level, radiological contrast was injected. Contrast injected under live fluoroscopy. No contrast complications. See chart for type and volume of contrast used. Fluoroscopic Guidance: I was personally present during the use of fluoroscopy. "Tunnel Vision Technique" used to obtain the best possible view of the target area. Parallax error corrected before commencing the procedure. "Direction-depth-direction" technique used to  introduce the needle under continuous pulsed fluoroscopy. Once target was reached, antero-posterior, oblique, and lateral fluoroscopic projection used confirm needle placement in all planes. Images permanently stored in EMR.        Interpretation: I personally interpreted the imaging intraoperatively. Adequate needle placement confirmed in multiple planes. Appropriate spread of contrast into desired area was observed. No evidence of afferent or efferent intravascular uptake. No intrathecal or subarachnoid spread observed. Permanent images saved into the patient's record.  Today's epidurogram shows evidence of a cut off of contrast at the L5-S1 level, bilaterally, with no contrast observed to be delineating the L5 nerve roots on either side.  The racks catheter was brought up to that level and the procedure accomplished without problems.  Antibiotic Prophylaxis:   Anti-infectives (From admission, onward)   None     Indication(s): None identified  Post-operative Assessment:  Post-procedure Vital Signs:  Pulse/HCG Rate: 9076 Temp: (!) 97.5 F (36.4 C) Resp: 15 BP: (!) 96/53 SpO2: 96 %  EBL: None  Complications: No immediate post-treatment complications observed by team, or reported by patient.  Note: The patient tolerated the entire procedure well. A repeat set of vitals were taken after the procedure and the patient was kept under observation following institutional policy, for this type of procedure. Post-procedural neurological assessment was performed, showing return to baseline, prior to discharge. The patient was provided with post-procedure discharge instructions, including a section on how to identify potential problems. Should any problems arise concerning this procedure, the patient was given instructions to immediately contact us, at any time, without hesitation. In any case, we plan to contact the patient by telephone for a follow-up status report regarding this interventional  procedure.  Comments:  No additional relevant information.  Plan of Care  Orders:  Orders Placed This Encounter  Procedures  . Racz (One Day)    Equipment: Epidural Tray; Racz Catheter; 10% Hypertonic Saline; Hyaluronidase; 3/4" Steri-strips; 4x4 Sterile Gauze pack.    Scheduling Instructions:     Procedure: RACZ Epidural Lysis of Adhesions     Side: Midline     Sedation: Moderate Conscious Sedation     Timeframe: Today  . [facility] C-ARM 1-60 MIN NO REPORT    Intraoperative interpretation by procedural physician at [place].    Standing Status:   Standing    Number of Occurrences:   1    Order Specific Question:   Reason for exam:    Answer:   Assistance in needle guidance and placement for procedures requiring needle placement in or near specific anatomical locations not easily accessible without such assistance.  . Informed Consent Details: Physician/Practitioner Attestation; Transcribe to consent form and obtain patient signature    Provider Attestation: I, Le [place] Dossie Arbour, MD, (Pain Management Specialist), the physician/practitioner, attest that I have discussed with  the patient the benefits, risks, side effects, alternatives, likelihood of achieving goals and potential problems during recovery for the procedure that I have provided informed consent.    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     Note: Always confirm area and laterality of pain with Mr. Meriwether, before procedure.  . Provide equipment / supplies at bedside    Equipment required: Single use, disposable, "Epidural Tray" Epidural Catheter: NOT required    Standing Status:   Standing    Number of Occurrences:   1    Order Specific Question:   Specify    Answer:   Epidural Tray   Chronic Opioid  Analgesic:  OxycodoneIR10 mg, 1 tab PO q 6 hrs (40 mg/day of oxycodone) MME/day:'60mg'$ /day.   Medications ordered for procedure: Meds ordered this encounter  Medications  . meloxicam (MOBIC) 15 MG tablet    Sig: Take 1 tablet (15 mg total) by mouth daily.    Dispense:  90 tablet    Refill:  1    Fill one day early if pharmacy is closed on scheduled refill date. May substitute for generic if available.  . Oxycodone HCl 10 MG TABS    Sig: Take 1 tablet (10 mg total) by mouth every 6 (six) hours as needed (Only if needed, not around-the-clock). Must last 30 days    Dispense:  120 tablet    Refill:  0    Chronic Pain: STOP Act (Not applicable) Fill 1 day early if closed on refill date. Do not fill until: 09/24/2019. To last until: 10/24/2019. Avoid benzodiazepines within 8 hours of opioids  . Oxycodone HCl 10 MG TABS    Sig: Take 1 tablet (10 mg total) by mouth every 6 (six) hours as needed (Only if needed, not around-the-clock). Must last 30 days    Dispense:  120 tablet    Refill:  0    Chronic Pain: STOP Act (Not applicable) Fill 1 day early if closed on refill date. Do not fill until: 10/24/2019. To last until: 11/23/2019. Avoid benzodiazepines within 8 hours of opioids  . Oxycodone HCl 10 MG TABS    Sig: Take 1 tablet (10 mg total) by mouth every 6 (six) hours as needed (Only if needed, not around-the-clock). Must last 30 days    Dispense:  120 tablet    Refill:  0    Chronic Pain: STOP Act (Not applicable) Fill 1 day early if closed on refill date. Do not fill until: 11/23/2019. To last until: 12/23/2019. Avoid benzodiazepines within 8 hours of opioids  . iohexol (OMNIPAQUE) 180 MG/ML injection 10 mL    Must be Myelogram-compatible. If not available, you may substitute with a water-soluble, non-ionic, hypoallergenic, myelogram-compatible radiological contrast medium.  Marland Kitchen lidocaine (XYLOCAINE) 2 % (with pres) injection 400 mg  . lactated ringers infusion 1,000 mL  . midazolam (VERSED) 5  MG/5ML injection 1-2 mg    Make sure Flumazenil is available in the pyxis when using this medication. If oversedation occurs, administer 0.2 mg IV over 15 sec. If after 45 sec no response, administer 0.2 mg again over 1 min; may repeat at 1 min intervals; not to exceed 4 doses (1 mg)  . fentaNYL (SUBLIMAZE) injection 25-50 mcg    Make sure Narcan is available in the pyxis when using this medication. In the event  of respiratory depression (RR< 8/min): Titrate NARCAN (naloxone) in increments of 0.1 to 0.2 mg IV at 2-3 minute intervals, until desired degree of reversal.  . lidocaine-EPINEPHrine (XYLOCAINE W/EPI) 2 %-1:200000 (PF) injection 10 mL    If 2% is unavailable, may be substituted with 1.5%, but must be preservative-free. If 1:200,000 concentration of epinephrine is not available, it may be substituted with 1:100,000. Notify physician of changes, before procedure.  . triamcinolone acetonide (KENALOG-40) injection 40 mg  . sodium chloride flush (NS) 0.9 % injection 4 mL  . hyaluronidase Human (HYLENEX) injection 1,500 Units    10 mL of the 150 Units/mL  . sodium chloride hypertonic 10% epidural injection    For Racz Epidural Lysis of Adhesions.  . ropivacaine (PF) 5 mg/mL (0.5%) (NAROPIN) injection 5 mL   Medications administered: We administered iohexol, lidocaine, lactated ringers, midazolam, fentaNYL, lidocaine-EPINEPHrine, triamcinolone acetonide, sodium chloride flush, hyaluronidase Human, sodium chloride hypertonic 10% epidural injection, and ropivacaine (PF) 5 mg/mL (0.5%).  See the medical record for exact dosing, route, and time of administration.  Follow-up plan:   Return in about 2 weeks (around 10/05/2019) for (VV), (PP).       Interventional management options:  Considering:   Diagnostic bilateral lumbar facet block #1 Possible bilateral lumbar facet RFA Diagnostic bilateral sacroiliac joint block #1 Possible bilateral sacroiliac joint RFA Possible spinal cord  stimulator trial   Palliative PRN treatment(s):   Palliative left Racz procedure #4(targeting the left S2, S3 area) (last done on 04/11/2019)       Recent Visits Date Type Provider Dept  09/20/19 Telemedicine Milinda Pointer, MD [facility]  06/26/19 Telemedicine Milinda Pointer, MD [facility]  Showing recent visits within past 90 days and meeting all other requirements   Today's Visits Date Type Provider Dept  09/21/19 Procedure visit Milinda Pointer, MD [facility]  Showing today's visits and meeting all other requirements   Future Appointments Date Type Provider Dept  10/04/19 Appointment Milinda Pointer, [facility]  12/20/19 Appointment Milinda Pointer, MD [facility]  Showing future appointments within next 90 days and meeting all other requirements   Disposition: Discharge home  Discharge (Date  Time): 09/21/2019; 1150 hrs.   Primary Care Physician: Jodi Marble, MD Location: [facility] Note by: Gaspar Cola, MD Date: 09/21/2019; Time: 12:55 PM  Disclaimer:  Medicine is not an Chief Strategy Officer. The only guarantee in medicine is that nothing is guaranteed. It is important to note that the decision to proceed with this intervention was based on the information collected from the patient. The Data and conclusions were drawn from the patient's questionnaire, the interview, and the physical examination. Because the information was provided in large part by the patient, it cannot be guaranteed that it has not been purposely or unconsciously manipulated. Every effort has been made to obtain as much relevant data as possible for this evaluation. It is important to note that the conclusions that lead to this procedure are derived in large part from the available data. Always take into account that the treatment will also be dependent on availability of resources and existing  treatment guidelines, considered by other Pain Management Practitioners as being common knowledge and practice, at the time of the intervention. For Medico-Legal purposes, it is also important to point out that variation in procedural techniques and pharmacological choices are the acceptable norm. The indications, contraindications, technique, and results of the above procedure should only be interpreted and  judged by a Board-Certified Interventional Pain Specialist with extensive familiarity and expertise in the same exact procedure and technique.

## 2019-09-21 NOTE — Progress Notes (Signed)
Safety precautions to be maintained throughout the outpatient stay will include: orient to surroundings, keep bed in low position, maintain call bell within reach at all times, provide assistance with transfer out of bed and ambulation.  

## 2019-09-21 NOTE — Patient Instructions (Signed)

## 2019-09-22 ENCOUNTER — Telehealth: Payer: Self-pay

## 2019-09-22 NOTE — Telephone Encounter (Signed)
Post procedure phone call.  Patient states he is doing good.  

## 2019-10-03 ENCOUNTER — Encounter: Payer: Self-pay | Admitting: Pain Medicine

## 2019-10-03 NOTE — Progress Notes (Signed)
Pain relief after procedure (treated area only): (Questions asked to patient) 1. Starting about 15 minutes after the procedure, and "while the area was still numb" (from the local anesthetics), were you having any of your usual pain "in that area" (the treated area)?  (NOTE: NOT including the discomfort from the needle sticks.) First 1 hour:100 % better. First 4-6 hours:100 % better. 2. How long did the numbness from the local anesthetics last? (More than 6 hours?) Duration: 6 hours.  3. How much better is your pain now, when compared to before the procedure? Current benefit: 90 % better. 4. Can you move better now? Improvement in ROM (Range of Motion): Yes. 5. Can you do more now? Improvement in function: Yes. 4. Did you have any problems with the procedure? Side-effects/Complications: No.

## 2019-10-03 NOTE — Progress Notes (Signed)
Patient: Derek [name]  Service Category: E/M  Provider: Gaspar Cola, MD  DOB: [birth year]  DOS: 10/04/2019  Location: Office  MRN: [id number]  Setting: Ambulatory outpatient  Referring Provider: Jodi Marble, MD  Type: Established Patient  Specialty: Interventional Pain Management  PCP: Derek Marble, MD  Location: Remote location  Delivery: TeleHealth     Virtual Encounter - Pain Management PROVIDER NOTE: Information contained herein reflects review and annotations entered in association with encounter. Interpretation of such information and data should be left to medically-trained personnel. Information provided to patient can be located elsewhere in the medical record under "Patient Instructions". Document created using STT-dictation technology, any transcriptional errors that may result from process are unintentional.    Contact & Pharmacy Preferred: [phone] Home: [phone] (home) Mobile: [phone] (mobile) E-mail: [email or web address]  [place], [place] [address] [place] [address] Phone: [phone] Fax: [phone]   Pre-screening  Derek [name] offered "in-person" vs "virtual" encounter. He indicated preferring virtual for this encounter.   Reason COVID-19*  Social distancing based on CDC and AMA recommendations.   I contacted Derek [name] on 10/04/2019 via telephone.      I clearly identified myself as Derek Cola, MD. I verified that I was speaking with the correct person using two identifiers (Name: Derek [name], and date of birth: 02/07/1981).  Consent I sought verbal advanced consent from Derek [name] for virtual visit interactions. I informed Derek [name] of possible security and privacy concerns, risks, and limitations associated with providing "not-in-person" medical evaluation and management services. I also informed Derek [name] of the availability of "in-person" appointments. Finally, I  informed him that there would be a charge for the virtual visit and that he could be  personally, fully or partially, financially responsible for it. Derek [name] expressed understanding and agreed to proceed.   Historic Elements   Derek [name] is a 39 y.o. year old, male patient evaluated today after his last contact with our practice on 09/22/2019. Derek [name]  has a past medical history of Anxiety, Arthritis, and GERD (gastroesophageal reflux disease). He also  has a past surgical history that includes Back surgery; HAND REIMPLANTED; and Lumbar fusion (11/14). Derek [name] has a current medication list which includes the following prescription(s): gnp calcium 1200, cyclobenzaprine, diclofenac sodium, gnp vitamin d super strength, magnesium oxide, meloxicam, oxycodone hcl, [START ON 10/24/2019] oxycodone hcl, [START ON 11/23/2019] oxycodone hcl, tizanidine, and gabapentin. He  reports that he has been smoking. He has a 16.00 pack-year smoking history. He has never used smokeless tobacco. He reports current alcohol use. He reports that he does not use drugs. Derek [name] is allergic to amoxicillin and vicodin [hydrocodone-acetaminophen].   HPI  Today, he is being contacted for a post-procedure assessment.  According to the patient, he did great after the Racz procedure.  He was not having any problems until this morning when he felt a sudden, sharp, electrical-like pain going all the way down the leg.  Today I went ahead and reviewed with him his medications and as it turns out he is not taking any gabapentin or pregabalin.  I asked him if he had given it a try he indicated that he cannot remember having tried any of the two.  However review of the medical records reveal that at some point he did try some Lyrica.  Because of this I will go ahead and do a  trial of gabapentin starting 3 mg at bedtime and slowly increasing it as tolerated up to 900 mg at bedtime.  Today I talked to the patient about this medication and  had to be taken never to prevent the pain.  He understood and accepted.  I will call her back in about 45 days to reevaluate the trial.  Post-Procedure Evaluation  Procedure (09/21/2019): Therapeutic left Racz procedure #4 under fluoroscopic guidance and IV sedation Pre-procedure pain level:  3/10 Post-procedure: 0/10 (100% relief)  Sedation: Sedation provided.  Derek Martins, RN  10/03/2019 10:37 AM  Sign when Signing Visit Pain relief after procedure (treated area only): (Questions asked to patient) 1. Starting about 15 minutes after the procedure, and "while the area was still numb" (from the local anesthetics), were you having any of your usual pain "in that area" (the treated area)?  (NOTE: NOT including the discomfort from the needle sticks.) First 1 hour:100 % better. First 4-6 hours:100 % better. 2. How long did the numbness from the local anesthetics last? (More than 6 hours?) Duration: 6 hours.  3. How much better is your pain now, when compared to before the procedure? Current benefit: 90 % better. 4. Can you move better now? Improvement in ROM (Range of Motion): Yes. 5. Can you do more now? Improvement in function: Yes. 4. Did you have any problems with the procedure? Side-effects/Complications: No.  Current benefits: Defined as benefit that persist at this time.   Analgesia:  90-100% better Function: Derek [name] reports improvement in function ROM: Derek [name] reports improvement in ROM  Pharmacotherapy Assessment  Analgesic: OxycodoneIR10 mg, 1 tab PO q 6 hrs (40 mg/day of oxycodone) MME/day:81m/day.   Monitoring: [place] PMP: PDMP reviewed during this encounter.       Pharmacotherapy: No side-effects or adverse reactions reported. Compliance: No problems identified. Effectiveness: Clinically acceptable. Plan: Refer to "POC".  UDS:  Summary  Date Value Ref Range Status  01/16/2019 Note  Final    Comment:     ==================================================================== [employer] Select 13 (MW) ==================================================================== Test                             Result       Flag       Units Drug Present and Declared for Prescription Verification   Oxycodone                      834          EXPECTED   ng/mg creat   Oxymorphone                    4086         EXPECTED   ng/mg creat   Noroxycodone                   1565         EXPECTED   ng/mg creat   Noroxymorphone                 701          EXPECTED   ng/mg creat    Sources of oxycodone are scheduled prescription medications.    Oxymorphone, noroxycodone, and noroxymorphone are expected    metabolites of oxycodone. Oxymorphone is also available as a    scheduled prescription medication. ==================================================================== Test  Result    Flag   Units      Ref Range   Creatinine              88               mg/dL      >=20 ==================================================================== Declared Medications:  The flagging and interpretation on this report are based on the  following declared medications.  Unexpected results may arise from  inaccuracies in the declared medications.  **Note: The testing scope of this panel includes these medications:  Oxycodone  **Note: The testing scope of this panel does not include the  following reported medications:  Calcium  Cyclobenzaprine (Flexeril)  Magnesium (Mag-Ox)  Tizanidine (Zanaflex)  Vitamin D  Vitamin D3 ==================================================================== For clinical consultation, please call [phone]. ====================================================================    Laboratory Chemistry Profile   Renal Lab Results  Component Value Date   BUN 11 01/16/2019   CREATININE 1.08 01/16/2019   BCR 10 01/16/2019   GFRAA 99 01/16/2019   GFRNONAA 86 01/16/2019      Hepatic Lab Results  Component Value Date   AST 24 01/16/2019   ALBUMIN 4.6 01/16/2019   ALKPHOS 108 01/16/2019     Electrolytes Lab Results  Component Value Date   NA 143 01/16/2019   K 4.0 01/16/2019   CL 105 01/16/2019   CALCIUM 10.2 01/16/2019   MG 2.0 01/16/2019     Bone Lab Results  Component Value Date   25OHVITD1 39 01/16/2019   25OHVITD2 1.2 01/16/2019   25OHVITD3 38 01/16/2019     Inflammation (CRP: Acute Phase) (ESR: Chronic Phase) Lab Results  Component Value Date   CRP 4 01/16/2019   ESRSEDRATE 29 (H) 01/16/2019       Note: Above Lab results reviewed.  Imaging  DG PAIN CLINIC C-ARM 1-60 MIN NO REPORT Fluoro was used, but no Radiologist interpretation will be provided.  Please refer to "NOTES" tab for provider progress note.  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  Plan of Care  Problem-specific:  No problem-specific Assessment & Plan notes found for this encounter.  Mr. ALEXIOS KEOWN has a current medication list which includes the following long-term medication(s): gnp calcium 1200, cyclobenzaprine, meloxicam, oxycodone hcl, [START ON 10/24/2019] oxycodone hcl, [START ON 11/23/2019] oxycodone hcl, tizanidine, and gabapentin.  Pharmacotherapy (Medications Ordered): Meds ordered this encounter  Medications  . gabapentin (NEURONTIN) 300 MG capsule    Sig: Take 1 capsule (300 mg total) by mouth at bedtime for 15 days, THEN 2 capsules (600 mg total) at bedtime for 15 days, THEN 3 capsules (900 mg total) at bedtime for 15 days. Follow titration schedule.Marland Kitchen    Dispense:  90 capsule    Refill:  0    Fill one day early if pharmacy is closed on scheduled refill date. May substitute for generic if available.   Orders:  Orders Placed This Encounter  Procedures  . Racz Epidurolysis     Standing Status:   Standing    Number of Occurrences:   1    Standing Expiration Date:   04/04/2021    Scheduling Instructions:     Procedure: RACZ Epidural Lysis of Adhesions     Side: Midline to left, targeting the left S2, S3 nerve root area     Sedation: Moderate Conscious Sedation     Scheduling Timeframe: The patient  will call to schedule (PRN)    Order Specific Question:   Where will this procedure be performed?    Answer:   [facility]   Follow-up plan:   Return in about 6 weeks (around 11/15/2019) for (VV), (MM) to evaluate gabapentin trial..      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, S3 area) (last done on 04/11/2019)        Recent Visits Date Type Provider Dept  09/21/19 Procedure visit Milinda Pointer, MD [facility]  09/20/19 Telemedicine Milinda Pointer, MD [facility]  Showing recent visits within past 90 days and meeting all other requirements   Today's Visits Date Type Provider Dept  10/04/19 Telemedicine Milinda Pointer, MD [facility]  Showing today's visits and meeting all other requirements   Future Appointments Date Type Provider Dept  12/20/19 Appointment Milinda Pointer, MD [facility]  Showing future appointments within next 90 days and meeting all other requirements   I discussed the assessment and treatment plan with the patient. The patient was provided an opportunity to ask questions and all were answered. The patient agreed with the plan and demonstrated an understanding of the instructions.  Patient advised to call back or seek an in-person evaluation if the symptoms or  condition worsens.  Duration of encounter: 14 minutes.  Note by: Derek Cola, MD Date: 10/04/2019; Time: 9:06 AM

## 2019-10-04 ENCOUNTER — Other Ambulatory Visit: Payer: Self-pay

## 2019-10-04 ENCOUNTER — Ambulatory Visit: Payer: BC Managed Care – PPO | Attending: Pain Medicine | Admitting: Pain Medicine

## 2019-10-04 ENCOUNTER — Telehealth: Payer: Self-pay | Admitting: *Deleted

## 2019-10-04 DIAGNOSIS — M961 Postlaminectomy syndrome, not elsewhere classified: Secondary | ICD-10-CM

## 2019-10-04 DIAGNOSIS — G96198 Other disorders of meninges, not elsewhere classified: Secondary | ICD-10-CM | POA: Diagnosis not present

## 2019-10-04 DIAGNOSIS — M792 Neuralgia and neuritis, unspecified: Secondary | ICD-10-CM | POA: Diagnosis not present

## 2019-10-04 MED ORDER — GABAPENTIN 300 MG PO CAPS: ORAL_CAPSULE | ORAL | 0 refills | Status: DC

## 2019-10-04 NOTE — Telephone Encounter (Signed)
Would like to try Lyrica. Has taken Gabapentin before, does not remember what dose, it did not help.

## 2019-10-09 ENCOUNTER — Other Ambulatory Visit: Payer: Self-pay | Admitting: Pain Medicine

## 2019-10-09 NOTE — Telephone Encounter (Signed)
Patient informed of this.

## 2019-10-09 NOTE — Telephone Encounter (Signed)
Tell him NO. We talked about this and we need to do an appropriate trial of the gabapentin first. Remind him to follow my instructions as to how to take it. I will talk to him on his next VV and we'll decide then.

## 2019-11-13 NOTE — Progress Notes (Signed)
Patient: Derek Derek [name]  Service Category: E/M  Provider: Gaspar Cola, MD  DOB: March 05, 1981  DOS: 11/15/2019  Location: Office  MRN: [id number]  Setting: Ambulatory outpatient  Referring Provider: Jodi Marble, MD  Type: Established Patient  Specialty: Interventional Pain Management  PCP: Jodi Marble, MD  Location: Remote location  Delivery: TeleHealth     Virtual Encounter - Pain Management PROVIDER NOTE: Information contained herein reflects review and annotations entered in association with encounter. Interpretation of such information and data should be left to medically-trained personnel. Information provided to patient can be located elsewhere in the medical record under "Patient Instructions". Document created using STT-dictation technology, any transcriptional errors that may result from process are unintentional.    Contact & Pharmacy Preferred: [phone] Home: [phone] (home) Mobile: [phone] (mobile) E-mail: [email or web address]  Derek Derek [name], Derek [name] [address] [place] [address] Phone: [phone] Fax: [phone]   Pre-screening  Derek Derek [name] offered "in-person" vs "virtual" encounter. Derek [name] indicated preferring virtual for this encounter.   Reason COVID-19*  Social distancing based on CDC and AMA recommendations.   I contacted Derek Derek [name] on 11/15/2019 via telephone.      I clearly identified myself as Gaspar Cola, MD. I verified that I was speaking with the correct person using two identifiers (Name: Derek Derek [name], and date of birth: Mar 17, 1981).  Consent I sought verbal advanced consent from Derek Derek [name] for virtual visit interactions. I informed Derek Derek [name] of possible security and privacy concerns, risks, and limitations associated with providing "not-in-person" medical evaluation and management services. I also informed Derek Derek [name] of the availability of "in-person" appointments. Finally, I  informed him that there would be a charge for the virtual visit and that Derek [name] could be  personally, fully or partially, financially responsible for it. Derek Derek [name] expressed understanding and agreed to proceed.   Historic Elements   Derek Derek [name] is a 39 y.o. year old, male patient evaluated today after his last contact with our practice on 10/04/2019. Derek Derek [name]  has a past medical history of Anxiety, Arthritis, and GERD (gastroesophageal reflux disease). Derek [name] also  has a past surgical history that includes Back surgery; HAND REIMPLANTED; and Lumbar fusion (11/14). Derek Derek [name] has a current medication list which includes the following prescription(s): gnp calcium 1200, cyclobenzaprine, [START ON 01/21/2020] cyclobenzaprine, diclofenac sodium, gabapentin, gnp vitamin d super strength, magnesium oxide, meloxicam, [START ON 11/23/2019] oxycodone hcl, [START ON 12/23/2019] oxycodone hcl, [START ON 01/22/2020] oxycodone hcl, tizanidine, and [START ON 01/21/2020] tizanidine. Derek [name]  reports that Derek [name] has been smoking. Derek [name] has a 16.00 pack-year smoking history. Derek [name] has never used smokeless tobacco. Derek [name] reports current alcohol use. Derek [name] reports that Derek [name] does not use drugs. Derek Derek [name] is allergic to amoxicillin and vicodin [hydrocodone-acetaminophen].   HPI  Today, Derek [name] is being contacted for medication management.  The patient is being contacted to evaluate the gabapentin trial.  According to the patient Derek [name] is already taking 900 mg at bedtime and Derek [name] is not having any side effects but Derek [name] is also not noticing a whole lot of benefit.  Because of this, today we will continue to titrate the medication up as tolerated.  The patient was informed that eventually we can get up to around 3200 mg/day.  Derek [name] was also informed that in order to do this without hitting any side effects, we need to do it slowly.  Today I have instructed the patient  to add 300 mg in a.m. for 2 weeks, followed by 300 mg in a.m. and noon for another 2 weeks, followed by 300 mg  3 times daily plus his 900 mg at bedtime.  I will contact him in approximately 6 weeks to see how Derek [name] is doing with this titration.  On a separate note, the patient indicated that his last Racz procedure did not last but approximately 2-1/2 weeks.  Derek [name] indicates that Derek [name] still getting benefit, but it is not 100% relief of the pain like Derek [name] had with the prior one.  In reviewing both, this last one I did not advance the catheter as far as I did with the prior 1 because of the discomfort that the patient had experienced when injecting the medications.  However, the patient indicates that thanks to the IV sedation, Derek [name] has very little recall about that discomfort and Derek [name] much rather have it done that way since it lasted much longer than the last one.  Today we will add this to our notes so as to advance the catheter further as done on his 04/11/2019 procedure.  Pharmacotherapy Assessment  Analgesic: OxycodoneIR10 mg, 1 tab PO q 6 hrs (40 mg/day of oxycodone) MME/day:4m/day.   Monitoring: [place] PMP: PDMP reviewed during this encounter.       Pharmacotherapy: No side-effects or adverse reactions reported. Compliance: No problems identified. Effectiveness: Clinically acceptable. Plan: Refer to "POC".  UDS:  Summary  Date Value Ref Range Status  01/16/2019 Note  Final    Comment:    ==================================================================== [employer] (MW) ==================================================================== Test                             Result       Flag       Units Drug Present and Declared for Prescription Verification   Oxycodone                      834          EXPECTED   ng/mg creat   Oxymorphone                    4086         EXPECTED   ng/mg creat   Noroxycodone                   1565         EXPECTED   ng/mg creat   Noroxymorphone                 701          EXPECTED   ng/mg creat    Sources of oxycodone are scheduled prescription medications.     Oxymorphone, noroxycodone, and noroxymorphone are expected    metabolites of oxycodone. Oxymorphone is also available as a    scheduled prescription medication. ==================================================================== Test                      Result    Flag   Units      Ref Range   Creatinine              88               mg/dL      >=20 ==================================================================== Declared Medications:  The flagging and interpretation on this report are based on the  following declared medications.  Unexpected results may arise from  inaccuracies in the declared medications.  **Note: The testing scope of this panel includes these medications:  Oxycodone  **Note: The testing scope of this panel does not include the  following reported medications:  Calcium  Cyclobenzaprine (Flexeril)  Magnesium (Mag-Ox)  Tizanidine (Zanaflex)  Vitamin D  Vitamin D3 ==================================================================== For clinical consultation, please call [phone]. ====================================================================    Laboratory Chemistry Profile   Renal Lab Results  Component Value Date   BUN 11 01/16/2019   CREATININE 1.08 01/16/2019   BCR 10 01/16/2019   GFRAA 99 01/16/2019   GFRNONAA 86 01/16/2019     Hepatic Lab Results  Component Value Date   AST 24 01/16/2019   ALBUMIN 4.6 01/16/2019   ALKPHOS 108 01/16/2019     Electrolytes Lab Results  Component Value Date   NA 143 01/16/2019   K 4.0 01/16/2019   CL 105 01/16/2019   CALCIUM 10.2 01/16/2019   MG 2.0 01/16/2019     Bone Lab Results  Component Value Date   25OHVITD1 39 01/16/2019   25OHVITD2 1.2 01/16/2019   25OHVITD3 38 01/16/2019     Inflammation (CRP: Acute Phase) (ESR: Chronic Phase) Lab Results  Component Value Date   CRP 4 01/16/2019   ESRSEDRATE 29 (H) 01/16/2019       Note: Above Lab results reviewed.  Imaging  DG PAIN  CLINIC C-ARM 1-60 MIN NO REPORT Fluoro was used, but no Radiologist interpretation will be provided.  Please refer to "NOTES" tab for provider progress note.  Assessment  The primary encounter diagnosis was Chronic pain syndrome. Diagnoses of Failed back surgical syndrome (x3), Epidural fibrosis, Lumbar postlaminectomy syndrome, Chronic low back pain (Primary Area of Pain) (Bilateral) (L>R) w/ sciatica (Bilateral), Chronic lower extremity pain (Secondary Area of Pain) (Bilateral) (L>R), Chronic musculoskeletal pain, Spasm of back muscles, and Neurogenic pain were also pertinent to this visit.  Plan of Care  Problem-specific:  No problem-specific Assessment & Plan notes found for this encounter.  Mr. DRAYK HUMBARGER has a current medication list which includes the following long-term medication(s): gnp calcium 1200, cyclobenzaprine, [START ON 01/21/2020] cyclobenzaprine, gabapentin, meloxicam, [START ON 11/23/2019] oxycodone hcl, [START ON 12/23/2019] oxycodone hcl, [START ON 01/22/2020] oxycodone hcl, tizanidine, and [START ON 01/21/2020] tizanidine.  Pharmacotherapy (Medications Ordered): Meds ordered this encounter  Medications  . Oxycodone HCl 10 MG TABS    Sig: Take 1 tablet (10 mg total) by mouth every 6 (six) hours as needed (Only if needed, not around-the-clock). Must last 30 days    Dispense:  120 tablet    Refill:  0    Chronic Pain: STOP Act (Not applicable) Fill 1 day early if closed on refill date. Do not fill until: 12/23/2019. To last until: 01/22/2020. Avoid benzodiazepines within 8 hours of opioids  . Oxycodone HCl 10 MG TABS    Sig: Take 1 tablet (10 mg total) by mouth every 6 (six) hours as needed (Only if needed, not around-the-clock). Must last 30 days    Dispense:  120 tablet    Refill:  0    Chronic Pain: STOP Act (Not applicable) Fill 1 day early if closed on refill date. Do not fill until: 01/22/2020. To last until: 02/21/2020. Avoid benzodiazepines within 8 hours of opioids   . tiZANidine (ZANAFLEX) 4 MG tablet    Sig: Take 1 tablet (4 mg total) by mouth 2 (two) times daily as needed for muscle spasms (For day-time muscle pain/spasm).    Dispense:  60 tablet    Refill:  5    Fill one day early if pharmacy is closed on scheduled refill date. May substitute for generic if available.  . cyclobenzaprine (FLEXERIL) 10 MG tablet    Sig: Take 1 tablet (10 mg total) by mouth at bedtime. Must last 60 days    Dispense:  90 tablet    Refill:  1    Fill one day early if pharmacy is closed on scheduled refill date. May substitute for generic if available.  . gabapentin (NEURONTIN) 300 MG capsule    Sig: Take 3 capsules (900 mg total) by mouth at bedtime AND 1 capsule (300 mg total) 3 (three) times daily. Follow titration instructions...    Dispense:  180 capsule    Refill:  2    Fill one day early if pharmacy is closed on scheduled refill date. May substitute for generic if available.   Orders:  No orders of the defined types were placed in this encounter.  Follow-up plan:   Return in about 6 weeks (around 12/27/2019) for (VV), (MM) to evaluate gabapentin titration.      Interventional management options:  Considering:   Diagnostic bilateral lumbar facet block #1 Possible bilateral lumbar facet RFA Diagnostic bilateral sacroiliac joint block #1 Possible bilateral sacroiliac joint RFA Possible spinal cord stimulator trial   Palliative PRN treatment(s):   Palliative left Racz procedure #5(targeting the left S2, S3 area) (last done on  09/21/2019) -  the patient indicated that his last Racz procedure did not last but approximately 2-1/2 weeks.  Derek [name] indicates that Derek [name] still getting benefit, but it is not 100% relief of the pain like Derek [name] had with the prior one.  In reviewing both, this last one I did not advance the catheter as far as I did with the prior one because of the discomfort that the patient had experienced when injecting the medications.  However, the patient  indicates that thanks to the IV sedation, Derek [name] has very little recall about that discomfort and Derek [name] much rather have it done that way since it lasted much longer than the last one.  Today we will add this to our notes so as to advance the catheter further as done on his 30s intraventricular procedure.    Recent Visits Date Type Provider Dept  10/04/19 Telemedicine Milinda Pointer, MD [facility]  09/21/19 Procedure visit Milinda Pointer, MD [facility]  09/20/19 Telemedicine Milinda Pointer, MD [facility]  Showing recent visits within past 90 days and meeting all other requirements   Today's Visits Date Type Provider Dept  11/15/19 Telemedicine Milinda Pointer, MD [facility]  Showing today's visits and meeting all other requirements   Future Appointments Date Type Provider Dept  12/20/19 Appointment Milinda Pointer, MD [facility]  Showing future appointments within next 90 days and meeting all other requirements   I discussed the assessment and treatment plan with the patient. The patient was provided an opportunity to ask questions and all were answered. The patient agreed with the plan and demonstrated an understanding of the instructions.  Patient advised to call back or seek an in-person evaluation if the symptoms or condition worsens.  Duration of encounter: 13 minutes.  Note by: Gaspar Cola, MD Date: 11/15/2019; Time: 8:43 AM

## 2019-11-14 ENCOUNTER — Encounter: Payer: Self-pay | Admitting: Pain Medicine

## 2019-11-15 ENCOUNTER — Other Ambulatory Visit: Payer: Self-pay

## 2019-11-15 ENCOUNTER — Ambulatory Visit: Payer: BC Managed Care – PPO | Attending: Pain Medicine | Admitting: Pain Medicine

## 2019-11-15 DIAGNOSIS — G894 Chronic pain syndrome: Secondary | ICD-10-CM

## 2019-11-15 DIAGNOSIS — M5442 Lumbago with sciatica, left side: Secondary | ICD-10-CM | POA: Diagnosis not present

## 2019-11-15 DIAGNOSIS — M5441 Lumbago with sciatica, right side: Secondary | ICD-10-CM

## 2019-11-15 DIAGNOSIS — M961 Postlaminectomy syndrome, not elsewhere classified: Secondary | ICD-10-CM | POA: Diagnosis not present

## 2019-11-15 DIAGNOSIS — M62830 Muscle spasm of back: Secondary | ICD-10-CM

## 2019-11-15 DIAGNOSIS — M792 Neuralgia and neuritis, unspecified: Secondary | ICD-10-CM

## 2019-11-15 DIAGNOSIS — M79604 Pain in right leg: Secondary | ICD-10-CM

## 2019-11-15 DIAGNOSIS — M79605 Pain in left leg: Secondary | ICD-10-CM

## 2019-11-15 DIAGNOSIS — G96198 Other disorders of meninges, not elsewhere classified: Secondary | ICD-10-CM

## 2019-11-15 DIAGNOSIS — M7918 Myalgia, other site: Secondary | ICD-10-CM

## 2019-11-15 DIAGNOSIS — G8929 Other chronic pain: Secondary | ICD-10-CM

## 2019-11-15 MED ORDER — TIZANIDINE HCL 4 MG PO TABS: 4.0000 mg | ORAL_TABLET | Freq: Two times a day (BID) | ORAL | 5 refills | Status: DC | PRN

## 2019-11-15 MED ORDER — GABAPENTIN 300 MG PO CAPS: ORAL_CAPSULE | ORAL | 2 refills | Status: DC

## 2019-11-15 MED ORDER — OXYCODONE HCL 10 MG PO TABS: 10.0000 mg | ORAL_TABLET | Freq: Four times a day (QID) | ORAL | 0 refills | Status: DC | PRN

## 2019-11-15 MED ORDER — CYCLOBENZAPRINE HCL 10 MG PO TABS: 10.0000 mg | ORAL_TABLET | Freq: Every day | ORAL | 1 refills | Status: DC

## 2019-11-15 NOTE — Patient Instructions (Addendum)
Gabapentin titration instructions: 1.  Continue taking gabapentin 300 mg, 3 tablets p.o. at bedtime (900 mg at bedtime). 2.  Add 1 gabapentin 300 mg tablet to breakfast and continue taking the 900 mg at bedtime dose.  Follow this regimen for 2 weeks. 3.  After those 2 weeks then add 1 more gabapentin 300 mg tablet at noon.  Continue taking the 900 mg at bedtime and then 300 mg with breakfast as well.  Follow this regimen for another 2 weeks. 4.  After those 2 weeks then add 1 more gabapentin 300 mg tablet in the afternoon.  Continue taking the 900 mg at bedtime, 300 mg with breakfast, and 300 mg at noon, as well.  Continue this regimen for another 2 weeks, at which time we will have another appointment to evaluate this titration.

## 2019-12-19 NOTE — Progress Notes (Deleted)
Rescheduled

## 2019-12-20 ENCOUNTER — Other Ambulatory Visit: Payer: Self-pay

## 2019-12-20 ENCOUNTER — Ambulatory Visit: Payer: BC Managed Care – PPO | Attending: Pain Medicine | Admitting: Pain Medicine

## 2019-12-20 DIAGNOSIS — M79605 Pain in left leg: Secondary | ICD-10-CM

## 2019-12-20 DIAGNOSIS — M7918 Myalgia, other site: Secondary | ICD-10-CM

## 2019-12-20 DIAGNOSIS — Z79899 Other long term (current) drug therapy: Secondary | ICD-10-CM

## 2019-12-20 DIAGNOSIS — M5442 Lumbago with sciatica, left side: Secondary | ICD-10-CM | POA: Diagnosis not present

## 2019-12-20 DIAGNOSIS — M79604 Pain in right leg: Secondary | ICD-10-CM | POA: Diagnosis not present

## 2019-12-20 DIAGNOSIS — M62830 Muscle spasm of back: Secondary | ICD-10-CM

## 2019-12-20 DIAGNOSIS — G8929 Other chronic pain: Secondary | ICD-10-CM

## 2019-12-20 DIAGNOSIS — M5441 Lumbago with sciatica, right side: Secondary | ICD-10-CM

## 2019-12-20 DIAGNOSIS — G894 Chronic pain syndrome: Secondary | ICD-10-CM

## 2019-12-20 MED ORDER — CYCLOBENZAPRINE HCL 10 MG PO TABS: 10.0000 mg | ORAL_TABLET | Freq: Every day | ORAL | 1 refills | Status: DC

## 2019-12-20 MED ORDER — TIZANIDINE HCL 4 MG PO TABS: 4.0000 mg | ORAL_TABLET | Freq: Two times a day (BID) | ORAL | 5 refills | Status: AC | PRN

## 2019-12-20 NOTE — Progress Notes (Signed)
Patient: Derek [name]  Service Category: E/M  Provider: Gaspar Cola, MD  DOB: [birth year]  DOS: 12/20/2019  Location: Office  MRN: [id number]  Setting: Ambulatory outpatient  Referring Provider: Jodi Marble, MD  Type: Established Patient  Specialty: Interventional Pain Management  PCP: Jodi Marble, MD  Location: Remote location  Delivery: TeleHealth     Virtual Encounter - Pain Management PROVIDER NOTE: Information contained herein reflects review and annotations entered in association with encounter. Interpretation of such information and data should be left to medically-trained personnel. Information provided to patient can be located elsewhere in the medical record under "Patient Instructions". Document created using STT-dictation technology, any transcriptional errors that may result from process are unintentional.    Contact & Pharmacy Preferred: [phone] Home: [phone] (home) Mobile: [phone] (mobile) E-mail: [email or web address]  [place] [place] [address] [place] [address] Phone: [phone] Fax: [phone]   Pre-screening  Derek [name] offered "in-person" vs "virtual" encounter. He indicated preferring virtual for this encounter.   Reason COVID-19*  Social distancing based on CDC and AMA recommendations.   I contacted Derek [name] on 12/20/2019 via telephone.      I clearly identified myself as Gaspar Cola, MD. I verified that I was speaking with the correct person using two identifiers (Name: Derek [name], and date of birth: [birth year]).  Consent I sought verbal advanced consent from Derek [name] for virtual visit interactions. I informed Derek [name] of possible security and privacy concerns, risks, and limitations associated with providing "not-in-person" medical evaluation and management services. I also informed Derek [name] of the availability of "in-person" appointments. Finally, I  informed him that there would be a charge for the virtual visit and that he could be  personally, fully or partially, financially responsible for it. Derek [name] expressed understanding and agreed to proceed.   Historic Elements   Derek [name] is a 39 y.o. year old, male patient evaluated today after his last contact with our practice on 10/04/2019. Derek [name]  has a past medical history of Anxiety, Arthritis, and GERD (gastroesophageal reflux disease). He also  has a past surgical history that includes Back surgery; HAND REIMPLANTED; and Lumbar fusion (11/14). Derek [name] has a current medication list which includes the following prescription(s): gnp calcium 1200, [START ON 01/21/2020] cyclobenzaprine, diclofenac sodium, gabapentin, gnp vitamin d super strength, magnesium oxide, meloxicam, oxycodone hcl, [START ON 12/23/2019] oxycodone hcl, [START ON 01/22/2020] oxycodone hcl, and [START ON 01/21/2020] tizanidine. He  reports that he has been smoking. He has a 16.00 pack-year smoking history. He has never used smokeless tobacco. He reports current alcohol use. He reports that he does not use drugs. Derek [name] is allergic to amoxicillin and vicodin [hydrocodone-acetaminophen].   HPI  Today, he is being contacted for medication management. The patient indicates doing well with the current medication regimen. No adverse reactions or side effects reported to the medications, except for the Neurontin which he believes is making him a little sleepy.  Today I told him to go ahead and to discontinue the dose that he takes during the day to see if by any chance that helps.  I also reminded him that taking vitamin D should help to give him more energy.  We also cover the possible issue of this somnolence being secondary to the use of caffeine.  He will be pain a little bit more attention to those things.  In addition, we had a long conversation regarding some symptoms where he is experiencing sharp acute stabbing pains in  the area of the the right foot, around the plantar area.  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  He understood and accepted.  We also corrected the issue but arise when nursing wrote down that he was not taking the Flexeril and the tizanidine.  In fact he is.  He takes the Flexeril at bedtime because it makes him sleepy and the tizanidine during the day because it does not.  Pharmacotherapy Assessment  Analgesic: OxycodoneIR10 mg, 1 tab PO q 6 hrs (40 mg/day of oxycodone) MME/day:72m/day.   Monitoring: [place] PMP: PDMP reviewed during this encounter.       Pharmacotherapy: No side-effects or adverse reactions reported. Compliance: No problems identified. Effectiveness: Clinically acceptable. Plan: Refer to "POC".  UDS:  Summary  Date Value Ref Range Status  01/16/2019 Note  Final    Comment:    ==================================================================== [employer] (MW) ==================================================================== Test                             Result       Flag       Units Drug Present and Declared for Prescription Verification   Oxycodone                      834          EXPECTED   ng/mg creat   Oxymorphone                    4086         EXPECTED   ng/mg creat   Noroxycodone                   1565         EXPECTED   ng/mg creat   Noroxymorphone                 701          EXPECTED   ng/mg creat    Sources of oxycodone are scheduled prescription medications.    Oxymorphone, noroxycodone, and noroxymorphone are expected    metabolites of oxycodone. Oxymorphone is also available as a    scheduled prescription medication. ==================================================================== Test                      Result    Flag   Units      Ref Range   Creatinine              88               mg/dL      >=20 ==================================================================== Declared  Medications:  The flagging and interpretation on this report are based on the  following declared medications.  Unexpected results may arise from  inaccuracies in the declared medications.  **Note: The testing scope of this panel includes these medications:  Oxycodone  **Note: The testing scope of this panel does not include the  following reported medications:  Calcium  Cyclobenzaprine (Flexeril)  Magnesium (Mag-Ox)  Tizanidine (Zanaflex)  Vitamin D  Vitamin D3 ==================================================================== For clinical consultation, please call ([phone] ====================================================================     Laboratory Chemistry Profile   Renal Lab Results  Component Value Date   BUN 11 01/16/2019   CREATININE 1.08  01/16/2019   BCR 10 01/16/2019   GFRAA 99 01/16/2019   GFRNONAA 86 01/16/2019     Hepatic Lab Results  Component Value Date   AST 24 01/16/2019   ALBUMIN 4.6 01/16/2019   ALKPHOS 108 01/16/2019     Electrolytes Lab Results  Component Value Date   NA 143 01/16/2019   K 4.0 01/16/2019   CL 105 01/16/2019   CALCIUM 10.2 01/16/2019   MG 2.0 01/16/2019     Bone Lab Results  Component Value Date   25OHVITD1 39 01/16/2019   25OHVITD2 1.2 01/16/2019   25OHVITD3 38 01/16/2019     Inflammation (CRP: Acute Phase) (ESR: Chronic Phase) Lab Results  Component Value Date   CRP 4 01/16/2019   ESRSEDRATE 29 (H) 01/16/2019       Note: Above Lab results reviewed.   Imaging  DG PAIN CLINIC C-ARM 1-60 MIN NO REPORT Fluoro was used, but no Radiologist interpretation will be provided.  Please refer to "NOTES" tab for provider progress note.  Assessment  The primary encounter diagnosis was Chronic pain syndrome. Diagnoses of Chronic low back pain (Primary Area of Pain) (Bilateral) (L>R) w/ sciatica (Bilateral), Chronic lower extremity pain (Secondary Area of Pain) (Bilateral) (L>R), Pharmacologic therapy,  Chronic musculoskeletal pain, and Spasm of back muscles were also pertinent to this visit.  Plan of Care  Problem-specific:  No problem-specific Assessment & Plan notes found for this encounter.  Derek [name] has a current medication list which includes the following long-term medication(s): gnp calcium 1200, [START ON 01/21/2020] cyclobenzaprine, gabapentin, meloxicam, oxycodone hcl, [START ON 12/23/2019] oxycodone hcl, [START ON 01/22/2020] oxycodone hcl, and [START ON 01/21/2020] tizanidine.  Pharmacotherapy (Medications Ordered): Meds ordered this encounter  Medications  . tiZANidine (ZANAFLEX) 4 MG tablet    Sig: Take 1 tablet (4 mg total) by mouth 2 (two) times daily as needed for muscle spasms (For day-time muscle pain/spasm).    Dispense:  60 tablet    Refill:  5    Fill one day early if pharmacy is closed on scheduled refill date. May substitute for generic if available.  . cyclobenzaprine (FLEXERIL) 10 MG tablet    Sig: Take 1 tablet (10 mg total) by mouth at bedtime. Must last 60 days    Dispense:  90 tablet    Refill:  1    Fill one day early if pharmacy is closed on scheduled refill date. May substitute for generic if available.   Orders:  No orders of the defined types were placed in this encounter.  Follow-up plan:   Return in about 8 weeks (around 02/12/2020) for (F2F), (MM).      Interventional management options:  Considering:   Diagnostic bilateral lumbar facet block #1 Possible bilateral lumbar facet RFA Diagnostic bilateral sacroiliac joint block #1 Possible bilateral sacroiliac joint RFA Possible spinal cord stimulator trial   Palliative PRN treatment(s):   Palliative left Racz procedure #5(targeting the left S2, S3 area) (last done on  09/21/2019) -  the patient indicated that his last Racz procedure did not last but approximately 2-1/2 weeks.  He indicates that he still getting benefit, but it is not 100% relief of the pain like he had with the prior  one.  In reviewing both, this last one I did not advance the catheter as far as I did with the prior one because of the discomfort that the patient had experienced when injecting the medications.  However, the patient indicates that thanks to the IV  sedation, he has very little recall about that discomfort and he much rather have it done that way since it lasted much longer than the last one.  Today we will add this to our notes so as to advance the catheter further as done on his 30s intraventricular procedure.     Recent Visits Date Type Provider Dept  11/15/19 Telemedicine Milinda Pointer, MD [facility]  10/04/19 Telemedicine Milinda Pointer, MD [facility]  09/21/19 Procedure visit Milinda Pointer, MD [facility]  Showing recent visits within past 90 days and meeting all other requirements Today's Visits Date Type Provider Dept  12/20/19 Telemedicine Milinda Pointer, MD [facility]  Showing today's visits and meeting all other requirements Future Appointments No visits were found meeting these conditions. Showing future appointments within next 90 days and meeting all other requirements  I discussed the assessment and treatment plan with the patient. The patient was provided an opportunity to ask questions and all were answered. The patient agreed with the plan and demonstrated an understanding of the instructions.  Patient advised to call back or seek an in-person evaluation if the symptoms or condition worsens.  Duration of encounter: 30 minutes.  Note by: Gaspar Cola, MD Date: 12/20/2019; Time: 1:31 PM

## 2020-01-25 ENCOUNTER — Other Ambulatory Visit: Payer: Self-pay | Admitting: Pain Medicine

## 2020-01-25 DIAGNOSIS — M792 Neuralgia and neuritis, unspecified: Secondary | ICD-10-CM

## 2020-02-10 NOTE — Progress Notes (Signed)
PROVIDER NOTE: Information contained herein reflects review and annotations entered in association with encounter. Interpretation of such information and data should be left to medically-trained personnel. Information provided to patient can be located elsewhere in the medical record under "Patient Instructions". Document created using STT-dictation technology, any transcriptional errors that may result from process are unintentional.    Patient: Derek [name]  Service Category: E/M  Provider: Gaspar Cola, MD  DOB: 1980-12-23  DOS: 02/12/2020  Specialty: Interventional Pain Management  MRN: [id number]  Setting: Ambulatory outpatient  PCP: Jodi Marble, MD  Type: Established Patient    Referring Provider: Jodi Marble, MD  Location: Office  Delivery: Face-to-face     HPI  Reason for encounter: Mr. MUSAB WINGARD, a 39 y.o. year old male, is here today for evaluation and management of his Chronic pain syndrome [G89.4]. Mr. Altadonna primary complain today is Back Pain Last encounter: Practice (01/25/2020). My last encounter with him was on 01/25/2020. Pertinent problems: Mr. Lenzo has Lumbar pseudoarthrosis (L5-S1); Chronic low back pain (1ry area of Pain) (Bilateral) (L>R) w/ sciatica (Bilateral); Chronic lower extremity pain (2ry area of Pain) (Bilateral) (L>R); Chronic pain syndrome; Failed back surgical syndrome (x3); L5-S1 pseudoarthrosis; Chronic musculoskeletal pain; Spasm of back muscles; Sacroiliac joint dysfunction (Bilateral); Chronic sacroiliac joint pain (Bilateral); Somatic dysfunction of sacroiliac joints (Bilateral); Chronic hip pain (Bilateral); Epidural fibrosis; Lumbar postlaminectomy syndrome; DDD (degenerative disc disease), lumbosacral; Lumbar facet syndrome (Bilateral); Other specified dorsopathies, sacral and sacrococcygeal region; Spondylosis without myelopathy or radiculopathy, lumbosacral region; Neurogenic pain; Osteoarthritic spondylosis of lumbar spine; Tendonitis  of elbow, right; and Lateral epicondylitis of elbow (Right) on their pertinent problem list. Pain Assessment: Severity of Chronic pain is reported as a 1 /10. Location: Back Left/down the left leg.. Onset: More than a month ago. Quality: Burning. Timing: Constant. Modifying factor(s): heat, lying down and medications.. Vitals:  height is '5\' 10"'  (1.778 m) and weight is 180 lb (81.6 kg). His temporal temperature is 98.7 F (37.1 C). His blood pressure is 146/87 (abnormal) and his pulse is 107 (abnormal). His respiration is 16 and oxygen saturation is 100%.    The patient indicates doing well with the current medication regimen. No adverse reactions or side effects reported to the medications.  The one exception to this was the gabapentin which she found not to be effective but it was given him side effects in the form of being constantly somnolent, decreased libido, and in 1 week he indicates having overslept twice ending up going to work 1 hour late on those two occasions.  He indicated that he couldn't continue that trend and therefore discontinued his gabapentin.  I think this is appropriate especially in lieu of the fact that it was not helping.  The patient's last UDS was done on 01/16/2019 and today we will updated.  Today we will also transfer his nonopioid regimen to his PCP.  PMP review was compliant.  Transfer: Tizanidine (Zanaflex) 4 mg tablet, 1 tablet p.o. twice daily (#60/month) (07/19/2020); cyclobenzaprine (Flexeril) 10 mg tablet, 1 tablet p.o. at bedtime (#30/month) (07/19/2020); meloxicam (Mobic) 15 mg tablet, 1 tablet p.o. daily in a.m. (#30/month) (03/19/2020)  He indicates having some problems with his right elbow where he went to the orthopedic surgeon and they indicated that he has some tendinitis.  They provided him with a cream, but he indicates that it is rather hard to use.  I recommended taking Arnica cream and I have scheduled him to return for a right  lateral epicondyle  injection.  Pharmacotherapy Assessment   Analgesic: OxycodoneIR10 mg, 1 tab PO q 6 hrs (40 mg/day of oxycodone) MME/day:11m/day.   Monitoring: [place] PMP: PDMP reviewed during this encounter.       Pharmacotherapy: No side-effects or adverse reactions reported. Compliance: No problems identified. Effectiveness: Clinically acceptable.  BChauncey Fischer RN  02/12/2020  8:23 AM  Sign when Signing Visit Nursing Pain Medication Assessment:  Safety precautions to be maintained throughout the outpatient stay will include: orient to surroundings, keep bed in low position, maintain call bell within reach at all times, provide assistance with transfer out of bed and ambulation.  Medication Inspection Compliance: Pill count conducted under aseptic conditions, in front of the patient. Neither the pills nor the bottle was removed from the patient's sight at any time. Once count was completed pills were immediately returned to the patient in their original bottle.  Medication: Oxycodone IR Pill/Patch Count: 25 of 120 pills remain Pill/Patch Appearance: Markings consistent with prescribed medication Bottle Appearance: Standard pharmacy container. Clearly labeled. Filled Date: 7 / 176/ 21 Last Medication intake:  TodaySafety precautions to be maintained throughout the outpatient stay will include: orient to surroundings, keep bed in low position, maintain call bell within reach at all times, provide assistance with transfer out of bed and ambulation. Pt stated that he left 10 pills in his pill box at his girlfriend house.    UDS:  Summary  Date Value Ref Range Status  01/16/2019 Note  Final    Comment:    ==================================================================== [employer]ox[employer] Select 13 (MW) ==================================================================== Test                             Result       Flag       Units Drug Present and Declared for Prescription Verification   Oxycodone                       834          EXPECTED   ng/mg creat   Oxymorphone                    4086         EXPECTED   ng/mg creat   Noroxycodone                   1565         EXPECTED   ng/mg creat   Noroxymorphone                 701          EXPECTED   ng/mg creat    Sources of oxycodone are scheduled prescription medications.    Oxymorphone, noroxycodone, and noroxymorphone are expected    metabolites of oxycodone. Oxymorphone is also available as a    scheduled prescription medication. ==================================================================== Test                      Result    Flag   Units      Ref Range   Creatinine              88               mg/dL      >=20 ==================================================================== Declared Medications:  The flagging and interpretation on this report are based on the  following declared medications.  Unexpected results may arise from  inaccuracies in the declared medications.  **Note: The testing scope of this panel includes these medications:  Oxycodone  **Note: The testing scope of this panel does not include the  following reported medications:  Calcium  Cyclobenzaprine (Flexeril)  Magnesium (Mag-Ox)  Tizanidine (Zanaflex)  Vitamin D  Vitamin D3 ==================================================================== For clinical consultation, please call [phone]. ====================================================================      ROS  Constitutional: Denies any fever or chills Gastrointestinal: No reported hemesis, hematochezia, vomiting, or acute GI distress Musculoskeletal: Denies any acute onset joint swelling, redness, loss of ROM, or weakness Neurological: No reported episodes of acute onset apraxia, aphasia, dysarthria, agnosia, amnesia, paralysis, loss of coordination, or loss of consciousness  Medication Review  Diclofenac Sodium, GNP Calcium 1200, Magnesium Oxide, Oxycodone HCl, cyclobenzaprine,  meloxicam, and tiZANidine  History Review  Allergy: Mr. Rockers is allergic to amoxicillin and vicodin [hydrocodone-acetaminophen]. Drug: Mr. Rogerson  reports no history of drug use. Alcohol:  reports current alcohol use. Tobacco:  reports that he has been smoking. He has a 16.00 pack-year smoking history. He has never used smokeless tobacco. Social: Mr. Ridgley  reports that he has been smoking. He has a 16.00 pack-year smoking history. He has never used smokeless tobacco. He reports current alcohol use. He reports that he does not use drugs. Medical:  has a past medical history of Anxiety, Arthritis, and GERD (gastroesophageal reflux disease). Surgical: Mr. Caligiuri  has a past surgical history that includes Back surgery; HAND REIMPLANTED; and Lumbar fusion (11/14). Family: family history is not on file.  Laboratory Chemistry Profile   Renal Lab Results  Component Value Date   BUN 11 01/16/2019   CREATININE 1.08 01/16/2019   BCR 10 01/16/2019   GFRAA 99 01/16/2019   GFRNONAA 86 01/16/2019     Hepatic Lab Results  Component Value Date   AST 24 01/16/2019   ALBUMIN 4.6 01/16/2019   ALKPHOS 108 01/16/2019     Electrolytes Lab Results  Component Value Date   NA 143 01/16/2019   K 4.0 01/16/2019   CL 105 01/16/2019   CALCIUM 10.2 01/16/2019   MG 2.0 01/16/2019     Bone Lab Results  Component Value Date   25OHVITD1 39 01/16/2019   25OHVITD2 1.2 01/16/2019   25OHVITD3 38 01/16/2019     Inflammation (CRP: Acute Phase) (ESR: Chronic Phase) Lab Results  Component Value Date   CRP 4 01/16/2019   ESRSEDRATE 29 (H) 01/16/2019       Note: Above Lab results reviewed.  Recent Imaging Review  DG PAIN CLINIC C-ARM 1-60 MIN NO REPORT Fluoro was used, but no Radiologist interpretation will be provided.  Please refer to "NOTES" tab for provider progress note. Note: Reviewed        Physical Exam  General appearance: Well nourished, well developed, and well hydrated. In no apparent  acute distress Mental status: Alert, oriented x 3 (person, place, & time)       Respiratory: No evidence of acute respiratory distress Eyes: PERLA Vitals: BP (!) 146/87 (BP Location: Right Arm, Patient Position: Sitting, Cuff Size: Normal)    Pulse (!) 107    Temp 98.7 F (37.1 C) (Temporal)    Resp 16    Ht '5\' 10"'  (1.778 m)    Wt 180 lb (81.6 kg)    SpO2 100%    BMI 25.83 kg/m  BMI: Estimated body mass index is 25.83 kg/m as calculated from the following:   Height as of this encounter: 5'  10" (1.778 m).   Weight as of this encounter: 180 lb (81.6 kg). Ideal: Ideal body weight: 73 kg (160 lb 15 oz) Adjusted ideal body weight: 76.5 kg (168 lb 9 oz)  Assessment   Status Diagnosis  Controlled Controlled Controlled 1. Chronic pain syndrome   2. Lateral epicondylitis of elbow (Right)   3. Tendonitis of elbow, right   4. Chronic low back pain (1ry area of Pain) (Bilateral) (L>R) w/ sciatica (Bilateral)   5. Chronic lower extremity pain (2ry area of Pain) (Bilateral) (L>R)   6. Failed back surgical syndrome (x3)   7. Pharmacologic therapy   8. Neurogenic pain   9. Osteoarthritic spondylosis of lumbar spine   10. Vitamin D insufficiency      Updated Problems: Problem  Tendonitis of Elbow, Right  Lateral epicondylitis of elbow (Right)    Plan of Care  Problem-specific:  No problem-specific Assessment & Plan notes found for this encounter.  Mr. BLAYTON HUTTNER has a current medication list which includes the following long-term medication(s): gnp calcium 1200, cyclobenzaprine, [START ON 03/19/2020] meloxicam, [START ON 02/21/2020] oxycodone hcl, and tizanidine.  Pharmacotherapy (Medications Ordered): Meds ordered this encounter  Medications   Oxycodone HCl 10 MG TABS    Sig: Take 1 tablet (10 mg total) by mouth every 6 (six) hours as needed (Only if needed, not around-the-clock). Must last 30 days    Dispense:  120 tablet    Refill:  0    Chronic Pain: STOP Act (Not applicable)  Fill 1 day early if closed on refill date. Do not fill until: 02/21/2020. To last until: 03/22/2020. Avoid benzodiazepines within 8 hours of opioids   meloxicam (MOBIC) 15 MG tablet    Sig: Take 1 tablet (15 mg total) by mouth daily.    Dispense:  30 tablet    Refill:  4    Fill one day early if pharmacy is closed on scheduled refill date. May substitute for generic if available.   Orders:  Orders Placed This Encounter  Procedures   Injection tendon or ligament    Standing Status:   Future    Standing Expiration Date:   04/13/2020    Scheduling Instructions:     Type of Block:  Right elbow injection     Side: Right-sided     Sedation: No Sedation.     Timeframe: ASAA   ToxASSURE Select 13 (MW), Urine    Volume: 30 ml(s). Minimum 3 ml of urine is needed. Document temperature of fresh sample. Indications: Long term (current) use of opiate analgesic (B86.754)    Order Specific Question:   Release to patient    Answer:   Immediate   Follow-up plan:   Return for Procedure (no sedation): (R) Elbow inj..  (right lateral epicondyle injection)     Interventional management options:  Considering:   Diagnostic bilateral lumbar facet block #1 Possible bilateral lumbar facet RFA Diagnostic bilateral sacroiliac joint block #1 Possible bilateral sacroiliac joint RFA Possible spinal cord stimulator trial   Palliative PRN treatment(s):   Palliative left Racz procedure #5(targeting the left S2, S3 area) (last done on  09/21/2019) -  the patient indicated that his last Racz procedure did not last but approximately 2-1/2 weeks.  He indicates that he still getting benefit, but it is not 100% relief of the pain like he had with the prior one.  In reviewing both, this last one I did not advance the catheter as far as I did with the prior  one because of the discomfort that the patient had experienced when injecting the medications.  However, the patient indicates that thanks to the IV sedation, he  has very little recall about that discomfort and he much rather have it done that way since it lasted much longer than the last one.  Today we will add this to our notes so as to advance the catheter further as done on his 30s intraventricular procedure.    Recent Visits Date Type Provider Dept  12/20/19 Telemedicine Milinda Pointer, MD [facility]  11/15/19 Telemedicine Milinda Pointer, MD [facility]  Showing recent visits within past 90 days and meeting all other requirements Today's Visits Date Type Provider Dept  02/12/20 Office Visit Milinda Pointer, MD [facility]  Showing today's visits and meeting all other requirements Future Appointments Date Type Provider Dept  03/07/20 Appointment Milinda Pointer, MD [facility]  Showing future appointments within next 90 days and meeting all other requirements  I discussed the assessment and treatment plan with the patient. The patient was provided an opportunity to ask questions and all were answered. The patient agreed with the plan and demonstrated an understanding of the instructions.  Patient advised to call back or seek an in-person evaluation if the symptoms or condition worsens.  Duration of encounter: 30 minutes.  Note by: Gaspar Cola, MD Date: 02/12/2020; Time: 3:18 PM

## 2020-02-11 NOTE — Patient Instructions (Addendum)
ARNICA CREAM (Lotion)  ____________________________________________________________________________________________  Preparing for your procedure (without sedation)  Procedure appointments are limited to planned procedures:  No Prescription Refills.  No disability issues will be discussed.  No medication changes will be discussed.  Instructions:  Oral Intake: Do not eat or drink anything for at least 6 hours prior to your procedure. (Exception: Blood Pressure Medication. See below.)  Transportation: Unless otherwise stated by your physician, you may drive yourself after the procedure.  Blood Pressure Medicine: Do not forget to take your blood pressure medicine with a sip of water the morning of the procedure. If your Diastolic (lower reading)is above 100 mmHg, elective cases will be cancelled/rescheduled.  Blood thinners: These will need to be stopped for procedures. Notify our staff if you are taking any blood thinners. Depending on which one you take, there will be specific instructions on how and when to stop it.  Diabetics on insulin: Notify the staff so that you can be scheduled 1st case in the morning. If your diabetes requires high dose insulin, take only  of your normal insulin dose the morning of the procedure and notify the staff that you have done so.  Preventing infections: Shower with an antibacterial soap the morning of your procedure.   Build-up your immune system: Take 1000 mg of Vitamin C with every meal (3 times a day) the day prior to your procedure.  Antibiotics: Inform the staff if you have a condition or reason that requires you to take antibiotics before dental procedures.  Pregnancy: If you are pregnant, call and cancel the procedure.  Sickness: If you have a cold, fever, or any active infections, call and cancel the procedure.  Arrival: You must be in the facility at least 30 minutes prior to your scheduled procedure.  Children: Do not bring any  children with you.  Dress appropriately: Bring dark clothing that you would not mind if they get stained.  Valuables: Do not bring any jewelry or valuables.  Reasons to call and reschedule or cancel your procedure: (Following these recommendations will minimize the risk of a serious complication.)  Surgeries: Avoid having procedures within 2 weeks of any surgery. (Avoid for 2 weeks before or after any surgery).  Flu Shots: Avoid having procedures within 2 weeks of a flu shots or . (Avoid for 2 weeks before or after immunizations).  Barium: Avoid having a procedure within 7-10 days after having had a radiological study involving the use of radiological contrast. (Myelograms, Barium swallow or enema study).  Heart attacks: Avoid any elective procedures or surgeries for the initial 6 months after a "Myocardial Infarction" (Heart Attack).  Blood thinners: It is imperative that you stop these medications before procedures. Let us know if you if you take any blood thinner.   Infection: Avoid procedures during or within two weeks of an infection (including chest colds or gastrointestinal problems). Symptoms associated with infections include: Localized redness, fever, chills, night sweats or profuse sweating, burning sensation when voiding, cough, congestion, stuffiness, runny nose, sore throat, diarrhea, nausea, vomiting, cold or Flu symptoms, recent or current infections. It is specially important if the infection is over the area that we intend to treat.  Heart and lung problems: Symptoms that may suggest an active cardiopulmonary problem include: cough, chest pain, breathing difficulties or shortness of breath, dizziness, ankle swelling, uncontrolled high or unusually low blood pressure, and/or palpitations. If you are experiencing any of these symptoms, cancel your procedure and contact your primary care physician for an evaluation.  Remember:  Regular Business hours are:  Monday to Thursday  8:00 AM to 4:00 PM  Provider's Schedule: Delano Metz, MD:  Procedure days: Tuesday and Thursday 7:30 AM to 4:00 PM  Edward Jolly, MD:  Procedure days: Monday and Wednesday 7:30 AM to 4:00 PM ____________________________________________________________________________________________

## 2020-02-12 ENCOUNTER — Ambulatory Visit: Payer: BC Managed Care – PPO | Attending: Pain Medicine | Admitting: Pain Medicine

## 2020-02-12 ENCOUNTER — Other Ambulatory Visit: Payer: Self-pay

## 2020-02-12 ENCOUNTER — Encounter: Payer: Self-pay | Admitting: Pain Medicine

## 2020-02-12 VITALS — BP 146/87 | HR 107 | Temp 98.7°F | Resp 16 | Ht 70.0 in | Wt 180.0 lb

## 2020-02-12 DIAGNOSIS — M792 Neuralgia and neuritis, unspecified: Secondary | ICD-10-CM | POA: Diagnosis present

## 2020-02-12 DIAGNOSIS — M961 Postlaminectomy syndrome, not elsewhere classified: Secondary | ICD-10-CM | POA: Diagnosis present

## 2020-02-12 DIAGNOSIS — M778 Other enthesopathies, not elsewhere classified: Secondary | ICD-10-CM | POA: Diagnosis present

## 2020-02-12 DIAGNOSIS — M5441 Lumbago with sciatica, right side: Secondary | ICD-10-CM | POA: Diagnosis present

## 2020-02-12 DIAGNOSIS — M47896 Other spondylosis, lumbar region: Secondary | ICD-10-CM

## 2020-02-12 DIAGNOSIS — E559 Vitamin D deficiency, unspecified: Secondary | ICD-10-CM | POA: Diagnosis present

## 2020-02-12 DIAGNOSIS — Z79899 Other long term (current) drug therapy: Secondary | ICD-10-CM

## 2020-02-12 DIAGNOSIS — M79605 Pain in left leg: Secondary | ICD-10-CM | POA: Insufficient documentation

## 2020-02-12 DIAGNOSIS — M7711 Lateral epicondylitis, right elbow: Secondary | ICD-10-CM

## 2020-02-12 DIAGNOSIS — G894 Chronic pain syndrome: Secondary | ICD-10-CM | POA: Diagnosis not present

## 2020-02-12 DIAGNOSIS — M79604 Pain in right leg: Secondary | ICD-10-CM | POA: Diagnosis present

## 2020-02-12 DIAGNOSIS — G8929 Other chronic pain: Secondary | ICD-10-CM | POA: Diagnosis present

## 2020-02-12 DIAGNOSIS — M5442 Lumbago with sciatica, left side: Secondary | ICD-10-CM | POA: Diagnosis not present

## 2020-02-12 MED ORDER — MELOXICAM 15 MG PO TABS: 15.0000 mg | ORAL_TABLET | Freq: Every day | ORAL | 4 refills | Status: DC

## 2020-02-12 MED ORDER — OXYCODONE HCL 10 MG PO TABS: 10.0000 mg | ORAL_TABLET | Freq: Four times a day (QID) | ORAL | 0 refills | Status: DC | PRN

## 2020-02-12 NOTE — Progress Notes (Signed)
Nursing Pain Medication Assessment:  Safety precautions to be maintained throughout the outpatient stay will include: orient to surroundings, keep bed in low position, maintain call bell within reach at all times, provide assistance with transfer out of bed and ambulation.  Medication Inspection Compliance: Pill count conducted under aseptic conditions, in front of the patient. Neither the pills nor the bottle was removed from the patient's sight at any time. Once count was completed pills were immediately returned to the patient in their original bottle.  Medication: Oxycodone IR Pill/Patch Count: 25 of 120 pills remain Pill/Patch Appearance: Markings consistent with prescribed medication Bottle Appearance: Standard pharmacy container. Clearly labeled. Filled Date: 7 / 79 / 21 Last Medication intake:  TodaySafety precautions to be maintained throughout the outpatient stay will include: orient to surroundings, keep bed in low position, maintain call bell within reach at all times, provide assistance with transfer out of bed and ambulation. Pt stated that he left 10 pills in his pill box at his girlfriend house.

## 2020-02-14 LAB — TOXASSURE SELECT 13 (MW), URINE

## 2020-02-29 ENCOUNTER — Ambulatory Visit: Payer: BC Managed Care – PPO | Attending: Pain Medicine | Admitting: Pain Medicine

## 2020-02-29 ENCOUNTER — Other Ambulatory Visit: Payer: Self-pay

## 2020-02-29 ENCOUNTER — Encounter: Payer: Self-pay | Admitting: Pain Medicine

## 2020-02-29 VITALS — BP 140/83 | HR 88 | Temp 97.4°F | Resp 18 | Ht 70.0 in | Wt 181.0 lb

## 2020-02-29 DIAGNOSIS — M545 Low back pain, unspecified: Secondary | ICD-10-CM | POA: Insufficient documentation

## 2020-02-29 DIAGNOSIS — G8929 Other chronic pain: Secondary | ICD-10-CM | POA: Diagnosis present

## 2020-02-29 DIAGNOSIS — M533 Sacrococcygeal disorders, not elsewhere classified: Secondary | ICD-10-CM | POA: Diagnosis present

## 2020-02-29 DIAGNOSIS — M25521 Pain in right elbow: Secondary | ICD-10-CM | POA: Diagnosis present

## 2020-02-29 DIAGNOSIS — M47816 Spondylosis without myelopathy or radiculopathy, lumbar region: Secondary | ICD-10-CM | POA: Diagnosis present

## 2020-02-29 DIAGNOSIS — M7711 Lateral epicondylitis, right elbow: Secondary | ICD-10-CM | POA: Diagnosis not present

## 2020-02-29 MED ORDER — METHYLPREDNISOLONE ACETATE 80 MG/ML IJ SUSP
INTRAMUSCULAR | Status: AC
  Filled 2020-02-29: qty 1

## 2020-02-29 MED ORDER — LIDOCAINE HCL 2 % IJ SOLN
20.0000 mL | Freq: Once | INTRAMUSCULAR | Status: AC
  Administered 2020-02-29: 200 mg

## 2020-02-29 MED ORDER — ROPIVACAINE HCL 2 MG/ML IJ SOLN
INTRAMUSCULAR | Status: AC
  Filled 2020-02-29: qty 10

## 2020-02-29 MED ORDER — LIDOCAINE HCL 2 % IJ SOLN
INTRAMUSCULAR | Status: AC
  Filled 2020-02-29: qty 10

## 2020-02-29 MED ORDER — ROPIVACAINE HCL 2 MG/ML IJ SOLN
9.0000 mL | Freq: Once | INTRAMUSCULAR | Status: AC
  Administered 2020-02-29: 10 mL

## 2020-02-29 MED ORDER — METHYLPREDNISOLONE ACETATE 40 MG/ML IJ SUSP
INTRAMUSCULAR | Status: AC
  Filled 2020-02-29: qty 1

## 2020-02-29 MED ORDER — METHYLPREDNISOLONE ACETATE 80 MG/ML IJ SUSP
80.0000 mg | Freq: Once | INTRAMUSCULAR | Status: AC
  Administered 2020-02-29: 80 mg

## 2020-02-29 NOTE — Patient Instructions (Addendum)
____________________________________________________________________________________________  Post-Procedure Discharge Instructions  Instructions:  Apply ice:   Purpose: This will minimize any swelling and discomfort after procedure.   When: Day of procedure, as soon as you get home.  How: Fill a plastic sandwich bag with crushed ice. Cover it with a small towel and apply to injection site.  How long: (15 min on, 15 min off) Apply for 15 minutes then remove x 15 minutes.  Repeat sequence on day of procedure, until you go to bed.  Apply heat:   Purpose: To treat any soreness and discomfort from the procedure.  When: Starting the next day after the procedure.  How: Apply heat to procedure site starting the day following the procedure.  How long: May continue to repeat daily, until discomfort goes away.  Food intake: Start with clear liquids (like water) and advance to regular food, as tolerated.   Physical activities: Keep activities to a minimum for the first 8 hours after the procedure. After that, then as tolerated.  Driving: If you have received any sedation, be responsible and do not drive. You are not allowed to drive for 24 hours after having sedation.  Blood thinner: (Applies only to those taking blood thinners) You may restart your blood thinner 6 hours after your procedure.  Insulin: (Applies only to Diabetic patients taking insulin) As soon as you can eat, you may resume your normal dosing schedule.  Infection prevention: Keep procedure site clean and dry. Shower daily and clean area with soap and water.  Post-procedure Pain Diary: Extremely important that this be done correctly and accurately. Recorded information will be used to determine the next step in treatment. For the purpose of accuracy, follow these rules:  Evaluate only the area treated. Do not report or include pain from an untreated area. For the purpose of this evaluation, ignore all other areas of pain,  except for the treated area.  After your procedure, avoid taking a long nap and attempting to complete the pain diary after you wake up. Instead, set your alarm clock to go off every hour, on the hour, for the initial 8 hours after the procedure. Document the duration of the numbing medicine, and the relief you are getting from it.  Do not go to sleep and attempt to complete it later. It will not be accurate. If you received sedation, it is likely that you were given a medication that may cause amnesia. Because of this, completing the diary at a later time may cause the information to be inaccurate. This information is needed to plan your care.  Follow-up appointment: Keep your post-procedure follow-up evaluation appointment after the procedure (usually 2 weeks for most procedures, 6 weeks for radiofrequencies). DO NOT FORGET to bring you pain diary with you.   Expect: (What should I expect to see with my procedure?)  From numbing medicine (AKA: Local Anesthetics): Numbness or decrease in pain. You may also experience some weakness, which if present, could last for the duration of the local anesthetic.  Onset: Full effect within 15 minutes of injected.  Duration: It will depend on the type of local anesthetic used. On the average, 1 to 8 hours.   From steroids (Applies only if steroids were used): Decrease in swelling or inflammation. Once inflammation is improved, relief of the pain will follow.  Onset of benefits: Depends on the amount of swelling present. The more swelling, the longer it will take for the benefits to be seen. In some cases, up to 10 days.    Duration: Steroids will stay in the system x 2 weeks. Duration of benefits will depend on multiple posibilities including persistent irritating factors.  Side-effects: If present, they may typically last 2 weeks (the duration of the steroids).  Frequent: Cramps (if they occur, drink Gatorade and take over-the-counter Magnesium 450-500 mg  once to twice a day); water retention with temporary weight gain; increases in blood sugar; decreased immune system response; increased appetite.  Occasional: Facial flushing (red, warm cheeks); mood swings; menstrual changes.  Uncommon: Long-term decrease or suppression of natural hormones; bone thinning. (These are more common with higher doses or more frequent use. This is why we prefer that our patients avoid having any injection therapies in other practices.)   Very Rare: Severe mood changes; psychosis; aseptic necrosis.  From procedure: Some discomfort is to be expected once the numbing medicine wears off. This should be minimal if ice and heat are applied as instructed.  Call if: (When should I call?)  You experience numbness and weakness that gets worse with time, as opposed to wearing off.  New onset bowel or bladder incontinence. (Applies only to procedures done in the spine)  Emergency Numbers:  Durning business hours (Monday - Thursday, 8:00 AM - 4:00 PM) (Friday, 9:00 AM - 12:00 Noon): [phone]  After hours: [phone]  NOTE: If you are having a problem and are unable connect with, or to talk to a provider, then go to your nearest urgent care or emergency department. If the problem is serious and urgent, please call 911. ____________________________________________________________________________________________   ____________________________________________________________________________________________  Preparing for Procedure with Sedation  Procedure appointments are limited to planned procedures: . No Prescription Refills. . No disability issues will be discussed. . No medication changes will be discussed.  Instructions: . Oral Intake: Do not eat or drink anything for at least 8 hours prior to your procedure. (Exception: Blood Pressure Medication. See below.) . Transportation: Unless otherwise stated by your physician, you may drive yourself after the  procedure. . Blood Pressure Medicine: Do not forget to take your blood pressure medicine with a sip of water the morning of the procedure. If your Diastolic (lower reading)is above 100 mmHg, elective cases will be cancelled/rescheduled. . Blood thinners: These will need to be stopped for procedures. Notify our staff if you are taking any blood thinners. Depending on which one you take, there will be specific instructions on how and when to stop it. . Diabetics on insulin: Notify the staff so that you can be scheduled 1st case in the morning. If your diabetes requires high dose insulin, take only  of your normal insulin dose the morning of the procedure and notify the staff that you have done so. . Preventing infections: Shower with an antibacterial soap the morning of your procedure. . Build-up your immune system: Take 1000 mg of Vitamin C with every meal (3 times a day) the day prior to your procedure. . Antibiotics: Inform the staff if you have a condition or reason that requires you to take antibiotics before dental procedures. . Pregnancy: If you are pregnant, call and cancel the procedure. . Sickness: If you have a cold, fever, or any active infections, call and cancel the procedure. . Arrival: You must be in the facility at least 30 minutes prior to your scheduled procedure. . Children: Do not bring children with you. . Dress appropriately: Bring dark clothing that you would not mind if they get stained. . Valuables: Do not bring any jewelry or valuables.    Reasons to call and reschedule or cancel your procedure: (Following these recommendations will minimize the risk of a serious complication.) . Surgeries: Avoid having procedures within 2 weeks of any surgery. (Avoid for 2 weeks before or after any surgery). . Flu Shots: Avoid having procedures within 2 weeks of a flu shots or . (Avoid for 2 weeks before or after immunizations). . Barium: Avoid having a procedure within 7-10 days after  having had a radiological study involving the use of radiological contrast. (Myelograms, Barium swallow or enema study). . Heart attacks: Avoid any elective procedures or surgeries for the initial 6 months after a "Myocardial Infarction" (Heart Attack). . Blood thinners: It is imperative that you stop these medications before procedures. Let us know if you if you take any blood thinner.  . Infection: Avoid procedures during or within two weeks of an infection (including chest colds or gastrointestinal problems). Symptoms associated with infections include: Localized redness, fever, chills, night sweats or profuse sweating, burning sensation when voiding, cough, congestion, stuffiness, runny nose, sore throat, diarrhea, nausea, vomiting, cold or Flu symptoms, recent or current infections. It is specially important if the infection is over the area that we intend to treat. . Heart and lung problems: Symptoms that may suggest an active cardiopulmonary problem include: cough, chest pain, breathing difficulties or shortness of breath, dizziness, ankle swelling, uncontrolled high or unusually low blood pressure, and/or palpitations. If you are experiencing any of these symptoms, cancel your procedure and contact your primary care physician for an evaluation.  Remember:  Regular Business hours are:  Monday to Thursday 8:00 AM to 4:00 PM  Provider's Schedule: John Vasconcelos, MD:  Procedure days: Tuesday and Thursday 7:30 AM to 4:00 PM  Bilal Lateef, MD:  Procedure days: Monday and Wednesday 7:30 AM to 4:00 PM ____________________________________________________________________________________________    

## 2020-02-29 NOTE — Progress Notes (Signed)
PROVIDER NOTE: Information contained herein reflects review and annotations entered in association with encounter. Interpretation of such information and data should be left to medically-trained personnel. Information provided to patient can be located elsewhere in the medical record under "Patient Instructions". Document created using STT-dictation technology, any transcriptional errors that may result from process are unintentional.    Patient: Derek [name]  Service Category: Procedure  Provider: Oswaldo DoneFrancisco A Nickalus Thornsberry, MD  DOB: 11/29/1980  DOS: 02/29/2020  Location: [facility]  MRN: [id number]  Setting: Ambulatory - outpatient  Referring Provider: Sherron Mondayejan-Sie, S Ahmed, MD  Type: Established Patient  Specialty: Interventional Pain Management  PCP: Sherron Mondayejan-Sie, S Ahmed, MD   Primary Reason for Visit: Interventional Pain Management Treatment. CC: Elbow Pain (right)  Procedure:          Anesthesia, Analgesia, Anxiolysis:  Type: Diagnostic Elbow Joint Steroid Injection  #1  Region: Lateral Elbow Area Level: Elbow Laterality: Right  Type: Local Anesthesia Indication(s): Analgesia         Local Anesthetic: Lidocaine 1-2% Route: Infiltration ([place]/IM) IV Access: Declined Sedation: Declined   Position: Sitting   Indications: 1. Lateral epicondylitis of elbow (Right)   2. Chronic elbow pain (Right)    Pain Score: Pre-procedure: 3 /10 Post-procedure: 3 /10   Today the patient comes in indicating having some pain in the lateral epicondyle area.  We will go ahead and treat this today.  In addition, the patient has requested that we take a look at his left lower back where he is having increased pain.  Provocative examination today with hyperextension and rotation was positive for reproducing his low back pain.  He describes the low back pain to be on the left side with some pain going down the posterior aspect of the leg to about the level of the knee.  This pain seems to be worse than  the back, suggesting that the etiology may be in the external portion of the spine.  The positive hyperextension and rotation maneuver would indicate a left lumbar facet syndrome since it did reproduce his left posterior leg pain.  In addition, provocative Luisa Hartatrick maneuver was also positive on the left side for sacroiliac joint pain and negative for any hip arthralgia.  Straight leg raise was negative bilaterally.  Pre-op Assessment:  Derek [name] is a 39 y.o. (year old), male patient, seen today for interventional treatment. He  has a past surgical history that includes Back surgery; HAND REIMPLANTED; and Lumbar fusion (11/14). Derek [name] has a current medication list which includes the following prescription(s): gnp calcium 1200, cyclobenzaprine, diclofenac sodium, magnesium oxide, [START ON 03/19/2020] meloxicam, oxycodone hcl, and tizanidine. His primarily concern today is the Elbow Pain (right)  Initial Vital Signs:  Pulse/HCG Rate: 88  Temp: (!) 97.4 F (36.3 C) Resp: 18 BP: 140/83 SpO2: 99 %  BMI: Estimated body mass index is 25.97 kg/m as calculated from the following:   Height as of this encounter: 5\' 10"  (1.778 m).   Weight as of this encounter: 181 lb (82.1 kg).  Risk Assessment: Allergies: Reviewed. He is allergic to amoxicillin and vicodin [hydrocodone-acetaminophen].  Allergy Precautions: None required Coagulopathies: Reviewed. None identified.  Blood-thinner therapy: None at this time Active Infection(s): Reviewed. None identified. Derek [name] is afebrile  Site Confirmation: Derek [name] was asked to confirm the procedure and laterality before marking the site Procedure checklist: Completed Consent: Before the procedure and under the influence of no sedative(s), amnesic(s), or anxiolytics, the patient was informed of the treatment  options, risks and possible complications. To fulfill our ethical and legal obligations, as recommended by the American Medical Association's Code of  Ethics, I have informed the patient of my clinical impression; the nature and purpose of the treatment or procedure; the risks, benefits, and possible complications of the intervention; the alternatives, including doing nothing; the risk(s) and benefit(s) of the alternative treatment(s) or procedure(s); and the risk(s) and benefit(s) of doing nothing. The patient was provided information about the general risks and possible complications associated with the procedure. These may include, but are not limited to: failure to achieve desired goals, infection, bleeding, organ or nerve damage, allergic reactions, paralysis, and death. In addition, the patient was informed of those risks and complications associated to the procedure, such as failure to decrease pain; infection; bleeding; organ or nerve damage with subsequent damage to sensory, motor, and/or autonomic systems, resulting in permanent pain, numbness, and/or weakness of one or several areas of the body; allergic reactions; (i.e.: anaphylactic reaction); and/or death. Furthermore, the patient was informed of those risks and complications associated with the medications. These include, but are not limited to: allergic reactions (i.e.: anaphylactic or anaphylactoid reaction(s)); adrenal axis suppression; blood sugar elevation that in diabetics may result in ketoacidosis or comma; water retention that in patients with history of congestive heart failure may result in shortness of breath, pulmonary edema, and decompensation with resultant heart failure; weight gain; swelling or edema; medication-induced neural toxicity; particulate matter embolism and blood vessel occlusion with resultant organ, and/or nervous system infarction; and/or aseptic necrosis of one or more joints. Finally, the patient was informed that Medicine is not an exact science; therefore, there is also the possibility of unforeseen or unpredictable risks and/or possible complications that may  result in a catastrophic outcome. The patient indicated having understood very clearly. We have given the patient no guarantees and we have made no promises. Enough time was given to the patient to ask questions, all of which were answered to the patient's satisfaction. Mr. Mccartt has indicated that he wanted to continue with the procedure. Attestation: I, the ordering provider, attest that I have discussed with the patient the benefits, risks, side-effects, alternatives, likelihood of achieving goals, and potential problems during recovery for the procedure that I have provided informed consent. Date  Time: 02/29/2020  7:51 AM  Pre-Procedure Preparation:  Monitoring: As per clinic protocol. Respiration, ETCO2, SpO2, BP, heart rate and rhythm monitor placed and checked for adequate function Safety Precautions: Patient was assessed for positional comfort and pressure points before starting the procedure. Time-out: I initiated and conducted the "Time-out" before starting the procedure, as per protocol. The patient was asked to participate by confirming the accuracy of the "Time Out" information. Verification of the correct person, site, and procedure were performed and confirmed by me, the nursing staff, and the patient. "Time-out" conducted as per Joint Commission's Universal Protocol (UP.01.01.01). Time: 0819  Description of Procedure:          Target Area: Lateral epicondyle Approach: Lateral approach. Area Prepped: Entire elbow Region DuraPrep (Iodine Povacrylex [0.7% available iodine] and Isopropyl Alcohol, 74% w/w) Safety Precautions: Aspiration looking for blood return was conducted prior to all injections. At no point did we inject any substances, as a needle was being advanced. No attempts were made at seeking any paresthesias. Safe injection practices and needle disposal techniques used. Medications properly checked for expiration dates. SDV (single dose vial) medications used. Description of  the Procedure: Protocol guidelines were followed. The patient was placed  in position. The target area was identified and the area prepped in the usual manner. Skin & deeper tissues infiltrated with local anesthetic. Appropriate amount of time allowed to pass for local anesthetics to take effect. The procedure needles were then advanced to the target area. Proper needle placement secured. Negative aspiration confirmed. Solution injected in intermittent fashion, asking for systemic symptoms every 0.5cc of injectate. The needles were then removed and the area cleansed, making sure to leave some of the prepping solution back to take advantage of its long term bactericidal properties. Vitals:   02/29/20 0750  BP: 140/83  Pulse: 88  Resp: 18  Temp: (!) 97.4 F (36.3 C)  TempSrc: Temporal  SpO2: 99%  Weight: 181 lb (82.1 kg)  Height:  (1.778 m)    Start Time: 0819 hrs. End Time: 0823 hrs. Materials:  Needle(s) Type: Spinal Needle Gauge: 25G Length: 1.5-in Medication(s): Please see orders for medications and dosing details.      Imaging Guidance:          Type of Imaging Technique: None used Indication(s): N/A Exposure Time: No patient exposure Contrast: None used. Fluoroscopic Guidance: N/A Ultrasound Guidance: N/A Interpretation: N/A  Antibiotic Prophylaxis:   Anti-infectives (From admission, onward)   None     Indication(s): None identified  Post-operative Assessment:  Post-procedure Vital Signs:  Pulse/HCG Rate: 88  Temp: (!) 97.4 F (36.3 C) Resp: 18 BP: 140/83 SpO2: 99 %  EBL: None  Complications: No immediate post-treatment complications observed by team, or reported by patient.  Note: The patient tolerated the entire procedure well. A repeat set of vitals were taken after the procedure and the patient was kept under observation following institutional policy, for this type of procedure. Post-procedural neurological assessment was performed, showing return to  baseline, prior to discharge. The patient was provided with post-procedure discharge instructions, including a section on how to identify potential problems. Should any problems arise concerning this procedure, the patient was given instructions to immediately contact us, at any time, without hesitation. In any case, we plan to contact the patient by telephone for a follow-up status report regarding this interventional procedure.  Comments:  No additional relevant information.  Plan of Care  Orders:  Orders Placed This Encounter  Procedures  . Medium Joint Injection/Arthrocentesis    Level(s): Right elbow injection Laterality: Right Sedation: No Sedation Purpose: Diagnostic Indication(s): Sub-acute pain    Standing Status:   Future    Standing Expiration Date:   05/31/2020    Scheduling Instructions:     Requested Scheduling Timeframe: Today  . LUMBAR FACET(MEDIAL BRANCH NERVE BLOCK) MBNB    Standing Status:   Future    Standing Expiration Date:   03/31/2020    Scheduling Instructions:     Procedure: Lumbar facet block (AKA.: Lumbosacral medial branch nerve block)     Side: Left-sided     Level: L3-4, L4-5, & L5-S1 Facets (L2, L3, L4, L5, & S1 Medial Branch Nerves)     Sedation: With Sedation.     Timeframe: 3 weeks from now    Order Specific Question:   Where will this procedure be performed?    Answer:   [facility]  . SACROILIAC JOINT INJECTION    Standing Status:   Future    Standing Expiration Date:   03/31/2020    Scheduling Instructions:     Side: Left-sided     Sedation: With Sedation.     Timeframe: 3 weeks from now  Order Specific Question:   Where will this procedure be performed?    Answer:   [facility]  . Informed Consent Details: Physician/Practitioner Attestation; Transcribe to consent form and obtain patient signature    Provider Attestation: I, Darcelle Herrada A. Laban Emperor, MD, (Pain Management Specialist), the physician/practitioner, attest that I  have discussed with the patient the benefits, risks, side effects, alternatives, likelihood of achieving goals and potential problems during recovery for the procedure that I have provided informed consent.    Scheduling Instructions:     Procedure: Right elbow injection     Indication/Reason: Chronic right elbow pain     Note: Always confirm laterality of pain with Mr. Snuffer, before procedure.     Transcribe to consent form and obtain patient signature.  . Provide equipment / supplies at bedside    Equipment required: Single use, disposable, "Block Tray"    Standing Status:   Standing    Number of Occurrences:   1    Order Specific Question:   Specify    Answer:   Block Tray   Chronic Opioid Analgesic:  OxycodoneIR10 mg, 1 tab PO q 6 hrs (40 mg/day of oxycodone) MME/day:60mg /day.   Medications ordered for procedure: Meds ordered this encounter  Medications  . lidocaine (XYLOCAINE) 2 % (with pres) injection 400 mg  . methylPREDNISolone acetate (DEPO-MEDROL) injection 80 mg  . ropivacaine (PF) 2 mg/mL (0.2%) (NAROPIN) injection 9 mL   Medications administered: We administered lidocaine, methylPREDNISolone acetate, and ropivacaine (PF) 2 mg/mL (0.2%).  See the medical record for exact dosing, route, and time of administration.  Follow-up plan:   Return in about 2 weeks (around 03/14/2020) for (VV), (PP) Follow-up, PM on Proc-day, in addition, Procedure (w/ sedation): (L) L-FCT + SI.       Interventional management options:  Considering:   Diagnostic bilateral lumbar facet block #1 Possible bilateral lumbar facet RFA Diagnostic bilateral sacroiliac joint block #1 Possible bilateral sacroiliac joint RFA Possible spinal cord stimulator trial   Palliative PRN treatment(s):   Palliative left Racz procedure #5(targeting the left S2, S3 area) (last done on  09/21/2019) -  the patient indicated that his last Racz procedure did not last but approximately 2-1/2 weeks.  He indicates  that he still getting benefit, but it is not 100% relief of the pain like he had with the prior one.  In reviewing both, this last one I did not advance the catheter as far as I did with the prior one because of the discomfort that the patient had experienced when injecting the medications.  However, the patient indicates that thanks to the IV sedation, he has very little recall about that discomfort and he much rather have it done that way since it lasted much longer than the last one.    Recent Visits Date Type Provider Dept  02/12/20 Office Visit Delano Metz, MD [facility]  12/20/19 Telemedicine Delano Metz, MD [facility]  Showing recent visits within past 90 days and meeting all other requirements Today's Visits Date Type Provider Dept  02/29/20 Procedure visit Delano Metz, MD [facility]  Showing today's visits and meeting all other requirements Future Appointments Date Type Provider Dept  03/14/20 Appointment Delano Metz, MD [facility]  03/21/20 Appointment Delano Metz, MD [facility]  Showing future appointments within next 90 days and meeting all other requirements  Disposition: Discharge home  Discharge (Date  Time): 02/29/2020; 0830 hrs.   Primary Care Physician: Sherron Monday,  MD Location: [facility] Note by: Oswaldo Done, MD Date: 02/29/2020; Time: 9:07 AM  Disclaimer:  Medicine is not an Visual merchandiser. The only guarantee in medicine is that nothing is guaranteed. It is important to note that the decision to proceed with this intervention was based on the information collected from the patient. The Data and conclusions were drawn from the patient's questionnaire, the interview, and the physical examination. Because the information was provided in large part by the patient, it cannot be guaranteed that it has not been purposely or unconsciously  manipulated. Every effort has been made to obtain as much relevant data as possible for this evaluation. It is important to note that the conclusions that lead to this procedure are derived in large part from the available data. Always take into account that the treatment will also be dependent on availability of resources and existing treatment guidelines, considered by other Pain Management Practitioners as being common knowledge and practice, at the time of the intervention. For Medico-Legal purposes, it is also important to point out that variation in procedural techniques and pharmacological choices are the acceptable norm. The indications, contraindications, technique, and results of the above procedure should only be interpreted and judged by a Board-Certified Interventional Pain Specialist with extensive familiarity and expertise in the same exact procedure and technique.

## 2020-03-01 ENCOUNTER — Telehealth: Payer: Self-pay

## 2020-03-01 NOTE — Telephone Encounter (Signed)
Post procedure phone call.  Patient states he is hurting some today.Instructed to put heat on it today and to call us for any further questions or concerns.

## 2020-03-07 ENCOUNTER — Ambulatory Visit: Payer: BC Managed Care – PPO | Admitting: Pain Medicine

## 2020-03-13 ENCOUNTER — Encounter: Payer: Self-pay | Admitting: Pain Medicine

## 2020-03-14 ENCOUNTER — Ambulatory Visit: Payer: BC Managed Care – PPO | Attending: Pain Medicine | Admitting: Pain Medicine

## 2020-03-14 ENCOUNTER — Other Ambulatory Visit: Payer: Self-pay

## 2020-03-14 DIAGNOSIS — M25521 Pain in right elbow: Secondary | ICD-10-CM | POA: Diagnosis not present

## 2020-03-14 DIAGNOSIS — G894 Chronic pain syndrome: Secondary | ICD-10-CM | POA: Diagnosis not present

## 2020-03-14 DIAGNOSIS — M79604 Pain in right leg: Secondary | ICD-10-CM

## 2020-03-14 DIAGNOSIS — M961 Postlaminectomy syndrome, not elsewhere classified: Secondary | ICD-10-CM

## 2020-03-14 DIAGNOSIS — G8929 Other chronic pain: Secondary | ICD-10-CM

## 2020-03-14 DIAGNOSIS — M79605 Pain in left leg: Secondary | ICD-10-CM

## 2020-03-14 DIAGNOSIS — M5441 Lumbago with sciatica, right side: Secondary | ICD-10-CM

## 2020-03-14 DIAGNOSIS — M5442 Lumbago with sciatica, left side: Secondary | ICD-10-CM

## 2020-03-14 NOTE — Progress Notes (Signed)
Patient: Derek [name]  Service Category: E/M  Provider: Gaspar Cola, MD  DOB: Jun 20, 1981  DOS: 03/14/2020  Location: Office  MRN: [id number]  Setting: Ambulatory outpatient  Referring Provider: Jodi Marble, MD  Type: Established Patient  Specialty: Interventional Pain Management  PCP: Jodi Marble, MD  Location: Remote location  Delivery: TeleHealth     Virtual Encounter - Pain Management PROVIDER NOTE: Information contained herein reflects review and annotations entered in association with encounter. Interpretation of such information and data should be left to medically-trained personnel. Information provided to patient can be located elsewhere in the medical record under "Patient Instructions". Document created using STT-dictation technology, any transcriptional errors that may result from process are unintentional.    Contact & Pharmacy Preferred: [phone] Home: [phone] (home) Mobile: [phone] (mobile) E-mail: [email or web address]  [facility] [place] [address] [place] [address] Phone: [phone] Fax: [phone]   Pre-screening  Derek [name] offered "in-person" vs "virtual" encounter. He indicated preferring virtual for this encounter.   Reason COVID-19*  Social distancing based on CDC and AMA recommendations.   I contacted Derek [name] on 03/14/2020 via telephone.      I clearly identified myself as Gaspar Cola, MD. I verified that I was speaking with the correct person using two identifiers (Name: Derek [name], and date of birth: 1981-03-09).  Consent I sought verbal advanced consent from Derek [name] for virtual visit interactions. I informed Derek [name] of possible security and privacy concerns, risks, and limitations associated with providing "not-in-person" medical evaluation and management services. I also informed Derek [name] of the availability of "in-person" appointments. Finally, I  informed him that there would be a charge for the virtual visit and that he could be  personally, fully or partially, financially responsible for it. Mr. Balz expressed understanding and agreed to proceed.   Historic Elements   Derek [name] is a 39 y.o. year old, male patient evaluated today after our last contact on 02/29/2020. Derek [name]  has a past medical history of Anxiety, Arthritis, and GERD (gastroesophageal reflux disease). He also  has a past surgical history that includes Back surgery; HAND REIMPLANTED; and Lumbar fusion (11/14). Derek [name] has a current medication list which includes the following prescription(s): gnp calcium 1200, cyclobenzaprine, diclofenac sodium, magnesium oxide, [START ON 03/19/2020] meloxicam, oxycodone hcl, and tizanidine. He  reports that he has been smoking. He has a 16.00 pack-year smoking history. He has never used smokeless tobacco. He reports current alcohol use. He reports that he does not use drugs. Derek [name] is allergic to amoxicillin and vicodin [hydrocodone-acetaminophen].   HPI  Today, he is being contacted for a post-procedure assessment.  Today the patient informed me that he is pending on having some dental surgery were they will be removing several teeth and he is pretty sure that they will be talking to him about pain medicine.  Since he did not know what to tell them today, he has asked me about this.  I informed them that by him telling me about the surgery, he has complied with his part and the only thing that he needs to do is come by the clinic and pick up the handout that we have for the surgeons, for the same purpose.  Post-Procedure Evaluation  Procedure (02/29/2020): Diagnostic right elbow injection #1 (lateral epicondyle), no fluoroscopy or IV sedation. Pre-procedure pain level: 3/10 Post-procedure: 3/10 No initial benefit,  possibly due to rapid discharge after no sedation procedure, without enough time to allow full onset of  block.  Sedation: None.  Effectiveness during initial hour after procedure(Ultra-Short Term Relief): 100 %.  Local anesthetic used: Long-acting (4-6 hours) Effectiveness: Defined as any analgesic benefit obtained secondary to the administration of local anesthetics. This carries significant diagnostic value as to the etiological location, or anatomical origin, of the pain. Duration of benefit is expected to coincide with the duration of the local anesthetic used.  Effectiveness during initial 4-6 hours after procedure(Short-Term Relief): 100 %.  Long-term benefit: Defined as any relief past the pharmacologic duration of the local anesthetics.  Effectiveness past the initial 6 hours after procedure(Long-Term Relief): 100 %.  Current benefits: Defined as benefit that persist at this time.   Analgesia:  100% better Function: Derek [name] reports improvement in function ROM: Derek [name] reports improvement in ROM  Pharmacotherapy Assessment  Analgesic: OxycodoneIR10 mg, 1 tab PO q 6 hrs (40 mg/day of oxycodone) MME/day:18m/day.   Monitoring: [place] PMP: PDMP reviewed during this encounter.       Pharmacotherapy: No side-effects or adverse reactions reported. Compliance: No problems identified. Effectiveness: Clinically acceptable. Plan: Refer to "POC".  UDS:  Summary  Date Value Ref Range Status  02/12/2020 Note  Final    Comment:    ==================================================================== [employer] (MW) ==================================================================== Test                             Result       Flag       Units  Drug Present and Declared for Prescription Verification   Oxycodone                      2323         EXPECTED   ng/mg creat   Oxymorphone                    7355         EXPECTED   ng/mg creat   Noroxycodone                   2746         EXPECTED   ng/mg creat   Noroxymorphone                 805          EXPECTED   ng/mg creat     Sources of oxycodone are scheduled prescription medications.    Oxymorphone, noroxycodone, and noroxymorphone are expected    metabolites of oxycodone. Oxymorphone is also available as a    scheduled prescription medication.  ==================================================================== Test                      Result    Flag   Units      Ref Range   Creatinine              65               mg/dL      >=20 ==================================================================== Declared Medications:  The flagging and interpretation on this report are based on the  following declared medications.  Unexpected results may arise from  inaccuracies in the declared medications.   **Note: The testing scope of this panel includes these medications:   Oxycodone   **Note: The testing scope of this panel does  not include the  following reported medications:   Calcium  Cyclobenzaprine (Flexeril)  Diclofenac  Magnesium (Mag-Ox)  Meloxicam (Mobic)  Tizanidine (Zanaflex) ==================================================================== For clinical consultation, please call [phone]. ====================================================================     Laboratory Chemistry Profile   Renal Lab Results  Component Value Date   BUN 11 01/16/2019   CREATININE 1.08 01/16/2019   BCR 10 01/16/2019   GFRAA 99 01/16/2019   GFRNONAA 86 01/16/2019     Hepatic Lab Results  Component Value Date   AST 24 01/16/2019   ALBUMIN 4.6 01/16/2019   ALKPHOS 108 01/16/2019     Electrolytes Lab Results  Component Value Date   NA 143 01/16/2019   K 4.0 01/16/2019   CL 105 01/16/2019   CALCIUM 10.2 01/16/2019   MG 2.0 01/16/2019     Bone Lab Results  Component Value Date   25OHVITD1 39 01/16/2019   25OHVITD2 1.2 01/16/2019   25OHVITD3 38 01/16/2019     Inflammation (CRP: Acute Phase) (ESR: Chronic Phase) Lab Results  Component Value Date   CRP 4 01/16/2019    ESRSEDRATE 29 (H) 01/16/2019       Note: Above Lab results reviewed.  Imaging  DG PAIN CLINIC C-ARM 1-60 MIN NO REPORT Fluoro was used, but no Radiologist interpretation will be provided.  Please refer to "NOTES" tab for provider progress note.  Assessment  The primary encounter diagnosis was Chronic elbow pain (Right). 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  Plan of Care  Problem-specific:  No problem-specific Assessment & Plan notes found for this encounter.  Derek [name] has a current medication list which includes the following long-term medication(s): gnp calcium 1200, cyclobenzaprine, [START ON 03/19/2020] meloxicam, oxycodone hcl, and tizanidine.  Pharmacotherapy (Medications Ordered): No orders of the defined types were placed in this encounter.  Orders:  No orders of the defined types were placed in this encounter.  Follow-up plan:   No follow-ups on file.      Interventional Pending:      Under consideration:   Diagnostic bilateral lumbar facet block #1 Possible bilateral lumbar facet RFA Diagnostic bilateral sacroiliac joint block #1 Possible bilateral sacroiliac joint RFA Possible spinal cord stimulator trial   Palliative care options:   Palliative right lateral epicondyle (elbow) injection #2  Palliative left Racz procedure #5(targeting the left S2, S3 area) (last done on  09/21/2019) -  the patient indicated that his last Racz procedure did not last but approximately 2-1/2 weeks.  He indicates that he still getting benefit, but it is not 100% relief of the pain like he had with the prior one.  In reviewing both, this last one I did not advance the catheter as far as I did with the prior one because of the discomfort that the patient  had experienced when injecting the medications.  However, the patient indicates that thanks to the IV sedation, he has very little recall about that discomfort and he much rather have it done that way since it lasted much longer than the last one.    Recent Visits Date Type Provider Dept  02/29/20 Procedure visit Milinda Pointer, MD [facility]  02/12/20 Office Visit Milinda Pointer, MD [facility]  12/20/19 Telemedicine Milinda Pointer, MD [facility]  Showing recent visits within past 20  days and meeting all other requirements Today's Visits Date Type Provider Dept  03/14/20 Telemedicine Milinda Pointer, MD [facility]  Showing today's visits and meeting all other requirements Future Appointments Date Type Provider Dept  03/21/20 Appointment Milinda Pointer, MD [facility]  Showing future appointments within next 90 days and meeting all other requirements  I discussed the assessment and treatment plan with the patient. The patient was provided an opportunity to ask questions and all were answered. The patient agreed with the plan and demonstrated an understanding of the instructions.  Patient advised to call back or seek an in-person evaluation if the symptoms or condition worsens.  Duration of encounter: 18 minutes.  Note by: Gaspar Cola, MD Date: 03/14/2020; Time: 4:29 PM

## 2020-03-21 ENCOUNTER — Ambulatory Visit (HOSPITAL_BASED_OUTPATIENT_CLINIC_OR_DEPARTMENT_OTHER): Payer: BC Managed Care – PPO | Admitting: Pain Medicine

## 2020-03-21 ENCOUNTER — Other Ambulatory Visit: Payer: Self-pay

## 2020-03-21 ENCOUNTER — Ambulatory Visit
Admission: RE | Admit: 2020-03-21 | Discharge: 2020-03-21 | Disposition: A | Discharge status: Home / Self Care | Payer: BC Managed Care – PPO | Source: Ambulatory Visit | Attending: Pain Medicine | Admitting: Pain Medicine

## 2020-03-21 ENCOUNTER — Encounter: Payer: Self-pay | Admitting: Pain Medicine

## 2020-03-21 VITALS — BP 133/71 | HR 86 | Temp 97.9°F | Resp 15 | Ht 70.0 in | Wt 181.0 lb

## 2020-03-21 DIAGNOSIS — M9904 Segmental and somatic dysfunction of sacral region: Secondary | ICD-10-CM | POA: Insufficient documentation

## 2020-03-21 DIAGNOSIS — M533 Sacrococcygeal disorders, not elsewhere classified: Secondary | ICD-10-CM | POA: Insufficient documentation

## 2020-03-21 DIAGNOSIS — M47817 Spondylosis without myelopathy or radiculopathy, lumbosacral region: Secondary | ICD-10-CM | POA: Insufficient documentation

## 2020-03-21 DIAGNOSIS — M545 Low back pain: Secondary | ICD-10-CM | POA: Diagnosis present

## 2020-03-21 DIAGNOSIS — M47816 Spondylosis without myelopathy or radiculopathy, lumbar region: Secondary | ICD-10-CM | POA: Diagnosis not present

## 2020-03-21 DIAGNOSIS — G8929 Other chronic pain: Secondary | ICD-10-CM | POA: Diagnosis present

## 2020-03-21 DIAGNOSIS — M5388 Other specified dorsopathies, sacral and sacrococcygeal region: Secondary | ICD-10-CM | POA: Insufficient documentation

## 2020-03-21 MED ORDER — ROPIVACAINE HCL 2 MG/ML IJ SOLN
4.0000 mL | Freq: Once | INTRAMUSCULAR | Status: AC
  Administered 2020-03-21: 4 mL via INTRA_ARTICULAR

## 2020-03-21 MED ORDER — FENTANYL CITRATE (PF) 100 MCG/2ML IJ SOLN
INTRAMUSCULAR | Status: AC
  Filled 2020-03-21: qty 2

## 2020-03-21 MED ORDER — MIDAZOLAM HCL 5 MG/5ML IJ SOLN
1.0000 mg | INTRAMUSCULAR | Status: AC | PRN
  Administered 2020-03-21: 1 mg via INTRAVENOUS
  Administered 2020-03-21: 2 mg via INTRAVENOUS
  Administered 2020-03-21: 1 mg via INTRAVENOUS

## 2020-03-21 MED ORDER — TRIAMCINOLONE ACETONIDE 40 MG/ML IJ SUSP
INTRAMUSCULAR | Status: AC
  Filled 2020-03-21: qty 1

## 2020-03-21 MED ORDER — METHYLPREDNISOLONE ACETATE 80 MG/ML IJ SUSP
80.0000 mg | Freq: Once | INTRAMUSCULAR | Status: AC
  Administered 2020-03-21: 80 mg via INTRA_ARTICULAR

## 2020-03-21 MED ORDER — TRIAMCINOLONE ACETONIDE 40 MG/ML IJ SUSP
40.0000 mg | Freq: Once | INTRAMUSCULAR | Status: AC
  Administered 2020-03-21: 40 mg

## 2020-03-21 MED ORDER — ROPIVACAINE HCL 2 MG/ML IJ SOLN
9.0000 mL | Freq: Once | INTRAMUSCULAR | Status: AC
  Administered 2020-03-21: 9 mL via PERINEURAL

## 2020-03-21 MED ORDER — ROPIVACAINE HCL 2 MG/ML IJ SOLN
INTRAMUSCULAR | Status: AC
  Filled 2020-03-21: qty 20

## 2020-03-21 MED ORDER — FENTANYL CITRATE (PF) 100 MCG/2ML IJ SOLN
25.0000 ug | INTRAMUSCULAR | Status: AC | PRN
  Administered 2020-03-21: 50 ug via INTRAVENOUS

## 2020-03-21 MED ORDER — MIDAZOLAM HCL 5 MG/5ML IJ SOLN
INTRAMUSCULAR | Status: AC
  Filled 2020-03-21: qty 5

## 2020-03-21 MED ORDER — METHYLPREDNISOLONE ACETATE 80 MG/ML IJ SUSP
INTRAMUSCULAR | Status: AC
  Filled 2020-03-21: qty 1

## 2020-03-21 MED ORDER — LIDOCAINE HCL 2 % IJ SOLN
20.0000 mL | Freq: Once | INTRAMUSCULAR | Status: AC
  Administered 2020-03-21: 200 mg

## 2020-03-21 MED ORDER — LACTATED RINGERS IV SOLN
1000.0000 mL | Freq: Once | INTRAVENOUS | Status: AC
  Administered 2020-03-21: 1000 mL via INTRAVENOUS

## 2020-03-21 MED ORDER — LIDOCAINE HCL 2 % IJ SOLN
INTRAMUSCULAR | Status: AC
  Filled 2020-03-21: qty 10

## 2020-03-21 NOTE — Patient Instructions (Signed)

## 2020-03-21 NOTE — Progress Notes (Signed)
PROVIDER NOTE: Information contained herein reflects review and annotations entered in association with encounter. Interpretation of such information and data should be left to medically-trained personnel. Information provided to patient can be located elsewhere in the medical record under "Patient Instructions". Document created using STT-dictation technology, any transcriptional errors that may result from process are unintentional.    Patient: Derek [name]  Service Category: Procedure  Provider: Oswaldo Done, MD  DOB: 1981/01/18  DOS: 03/21/2020  Location: [facility]  MRN: [id number]  Setting: Ambulatory - outpatient  Referring Provider: Sherron Monday, MD  Type: Established Patient  Specialty: Interventional Pain Management  PCP: Sherron Monday, MD   Primary Reason for Visit: Interventional Pain Management Treatment. CC: Back Pain (lower)  Procedure:          Anesthesia, Analgesia, Anxiolysis:  Procedure #1: Type: Medial Branch Facet Block #1 Primary Purpose: Therapeutic Region: Lumbar Level: L2, L3, L4, L5, & S1 Medial Branch Level(s) Target Area: For Lumbar Facet blocks, the target is the groove formed by the junction of the transverse process and superior articular process. For the L5 dorsal ramus, the target is the notch between superior articular process and sacral ala. For the S1 dorsal ramus, the target is the superior and lateral edge of the posterior S1 Sacral foramen. Approach: Posterior, paramedial, percutaneous approach. Laterality: Left  Procedure #2: Type: Sacroiliac Joint Block  #1  Primary Purpose: Therapeutic Region: Posterior Lumbosacral Level: PSIS (Posterior Superior Iliac Spine) Sacroiliac Joint Target Area: For upper sacroiliac joint block(s), the target is the superior and posterior margin of the sacroiliac joint. Approach: Ipsilateral approach. Laterality: Left  Type: Moderate (Conscious) Sedation combined with Local  Anesthesia Indication(s): Analgesia and Anxiety Route: Intravenous (IV) IV Access: Secured Sedation: Meaningful verbal contact was maintained at all times during the procedure  Local Anesthetic: Lidocaine 1-2%  Position: Prone   Indications: 1. Lumbar facet syndrome (Bilateral) (L>R)   2. Chronic sacroiliac joint pain (Bilateral) (L>R)   3. Somatic dysfunction of sacroiliac joints (Bilateral)   4. Spondylosis without myelopathy or radiculopathy, lumbosacral region   5. Other specified dorsopathies, sacral and sacrococcygeal region   6. Chronic low back pain (Left) w/o sciatica    Pain Score: Pre-procedure: 4 /10 Post-procedure: 0-No pain/10   Pre-op Assessment:  Mr. Liberatore is a 39 y.o. (year old), male patient, seen today for interventional treatment. He  has a past surgical history that includes Back surgery; HAND REIMPLANTED; and Lumbar fusion (11/14). Mr. Barro has a current medication list which includes the following prescription(s): cyclobenzaprine, diclofenac sodium, magnesium oxide, meloxicam, tizanidine, gnp calcium 1200, oxycodone hcl, and [START ON 04/26/2020] oxycodone hcl. His primarily concern today is the Back Pain (lower)  Initial Vital Signs:  Pulse/HCG Rate: 85ECG Heart Rate: 86 Temp: 97.9 F (36.6 C) Resp: 16 BP: 125/84 SpO2: 99 %  BMI: Estimated body mass index is 25.97 kg/m as calculated from the following:   Height as of this encounter:  (1.778 m).   Weight as of this encounter: 181 lb (82.1 kg).  Risk Assessment: Allergies: Reviewed. He is allergic to amoxicillin and vicodin [hydrocodone-acetaminophen].  Allergy Precautions: None required Coagulopathies: Reviewed. None identified.  Blood-thinner therapy: None at this time Active Infection(s): Reviewed. None identified. Mr. Robin is afebrile  Site Confirmation: Mr. Hollingsworth was asked to confirm the procedure and laterality before marking the site Procedure checklist: Completed Consent: Before the  procedure and under the influence of no sedative(s), amnesic(s), or anxiolytics, the patient was  informed of the treatment options, risks and possible complications. To fulfill our ethical and legal obligations, as recommended by the American Medical Association's Code of Ethics, I have informed the patient of my clinical impression; the nature and purpose of the treatment or procedure; the risks, benefits, and possible complications of the intervention; the alternatives, including doing nothing; the risk(s) and benefit(s) of the alternative treatment(s) or procedure(s); and the risk(s) and benefit(s) of doing nothing. The patient was provided information about the general risks and possible complications associated with the procedure. These may include, but are not limited to: failure to achieve desired goals, infection, bleeding, organ or nerve damage, allergic reactions, paralysis, and death. In addition, the patient was informed of those risks and complications associated to Spine-related procedures, such as failure to decrease pain; infection (i.e.: Meningitis, epidural or intraspinal abscess); bleeding (i.e.: epidural hematoma, subarachnoid hemorrhage, or any other type of intraspinal or peri-dural bleeding); organ or nerve damage (i.e.: Any type of peripheral nerve, nerve root, or spinal cord injury) with subsequent damage to sensory, motor, and/or autonomic systems, resulting in permanent pain, numbness, and/or weakness of one or several areas of the body; allergic reactions; (i.e.: anaphylactic reaction); and/or death. Furthermore, the patient was informed of those risks and complications associated with the medications. These include, but are not limited to: allergic reactions (i.e.: anaphylactic or anaphylactoid reaction(s)); adrenal axis suppression; blood sugar elevation that in diabetics may result in ketoacidosis or comma; water retention that in patients with history of congestive heart failure  may result in shortness of breath, pulmonary edema, and decompensation with resultant heart failure; weight gain; swelling or edema; medication-induced neural toxicity; particulate matter embolism and blood vessel occlusion with resultant organ, and/or nervous system infarction; and/or aseptic necrosis of one or more joints. Finally, the patient was informed that Medicine is not an exact science; therefore, there is also the possibility of unforeseen or unpredictable risks and/or possible complications that may result in a catastrophic outcome. The patient indicated having understood very clearly. We have given the patient no guarantees and we have made no promises. Enough time was given to the patient to ask questions, all of which were answered to the patient's satisfaction. Mr. Cambre has indicated that he wanted to continue with the procedure. Attestation: I, the ordering provider, attest that I have discussed with the patient the benefits, risks, side-effects, alternatives, likelihood of achieving goals, and potential problems during recovery for the procedure that I have provided informed consent. Date  Time: 03/21/2020  8:02 AM  Pre-Procedure Preparation:  Monitoring: As per clinic protocol. Respiration, ETCO2, SpO2, BP, heart rate and rhythm monitor placed and checked for adequate function Safety Precautions: Patient was assessed for positional comfort and pressure points before starting the procedure. Time-out: I initiated and conducted the "Time-out" before starting the procedure, as per protocol. The patient was asked to participate by confirming the accuracy of the "Time Out" information. Verification of the correct person, site, and procedure were performed and confirmed by me, the nursing staff, and the patient. "Time-out" conducted as per Joint Commission's Universal Protocol (UP.01.01.01). Time: 0830  Description of Procedure #1:   Time-out: "Time-out" completed before starting procedure,  as per protocol. Area Prepped: Entire Posterior Lumbosacral Region DuraPrep (Iodine Povacrylex [0.7% available iodine] and Isopropyl Alcohol, 74% w/w) Safety Precautions: Aspiration looking for blood return was conducted prior to all injections. At no point did we inject any substances, as a needle was being advanced. No attempts were made at seeking any paresthesias.  Safe injection practices and needle disposal techniques used. Medications properly checked for expiration dates. SDV (single dose vial) medications used.  Description of the Procedure: Protocol guidelines were followed. The patient was placed in position over the fluoroscopy table. The target area was identified and the area prepped in the usual manner. Skin & deeper tissues infiltrated with local anesthetic. Appropriate amount of time allowed to pass for local anesthetics to take effect. The procedure needle was introduced through the skin, ipsilateral to the reported pain, and advanced to the target area. Employing the "Medial Branch Technique", the needles were advanced to the angle made by the superior and medial portion of the transverse process, and the lateral and inferior portion of the superior articulating process of the targeted vertebral bodies. This area is known as "Burton's Eye" or the "Eye of the [place] Dog". A procedure needle was introduced through the skin, and this time advanced to the angle made by the superior and medial border of the sacral ala, and the lateral border of the S1 vertebral body. This last needle was later repositioned at the superior and lateral border of the posterior S1 foramen. Negative aspiration confirmed. Solution injected in intermittent fashion, asking for systemic symptoms every 0.5cc of injectate. The needles were then removed and the area cleansed, making sure to leave some of the prepping solution back to take advantage of its long term bactericidal properties. Start Time: 0830 hrs. Materials:   Needle(s) Type: Spinal Needle Gauge: 22G Length: 3.5-in Medication(s): Please see orders for medications and dosing details.  Description of Procedure # 2:   Area Prepped: Entire Posterior Lumbosacral Region DuraPrep (Iodine Povacrylex [0.7% available iodine] and Isopropyl Alcohol, 74% w/w) Safety Precautions: Aspiration looking for blood return was conducted prior to all injections. At no point did we inject any substances, as a needle was being advanced. No attempts were made at seeking any paresthesias. Safe injection practices and needle disposal techniques used. Medications properly checked for expiration dates. SDV (single dose vial) medications used. Description of the Procedure: Protocol guidelines were followed. The patient was placed in position over the fluoroscopy table. The target area was identified and the area prepped in the usual manner. Skin & deeper tissues infiltrated with local anesthetic. Appropriate amount of time allowed to pass for local anesthetics to take effect. The procedure needle was advanced under fluoroscopic guidance into the sacroiliac joint until a firm endpoint was obtained. Proper needle placement secured. Negative aspiration confirmed. Solution injected in intermittent fashion, asking for systemic symptoms every 0.5cc of injectate. The needles were then removed and the area cleansed, making sure to leave some of the prepping solution back to take advantage of its long term bactericidal properties. Vitals:   03/21/20 0840 03/21/20 0850 03/21/20 0900 03/21/20 0910  BP: (!) 149/75 136/73 135/72 133/71  Pulse: 86     Resp: Temp:  98.1 F (36.7 C)  97.9 F (36.6 C)  TempSrc:      SpO2: 97% 95% 98% 99%  Weight:      Height:        End Time: 0838 hrs. Materials:  Needle(s) Type: Spinal Needle Gauge: 22G Length: 3.5-in Medication(s): Please see orders for medications and dosing details.  Imaging Guidance (Spinal):          Type of Imaging  Technique: Fluoroscopy Guidance (Spinal) Indication(s): Assistance in needle guidance and placement for procedures requiring needle placement in or near specific anatomical locations not easily accessible without such assistance.  Exposure Time: Please see nurses notes. Contrast: None used. Fluoroscopic Guidance: I was personally present during the use of fluoroscopy. "Tunnel Vision Technique" used to obtain the best possible view of the target area. Parallax error corrected before commencing the procedure. "Direction-depth-direction" technique used to introduce the needle under continuous pulsed fluoroscopy. Once target was reached, antero-posterior, oblique, and lateral fluoroscopic projection used confirm needle placement in all planes. Images permanently stored in EMR. Interpretation: No contrast injected. I personally interpreted the imaging intraoperatively. Adequate needle placement confirmed in multiple planes. Permanent images saved into the patient's record.  Antibiotic Prophylaxis:   Anti-infectives (From admission, onward)   None     Indication(s): None identified  Post-operative Assessment:  Post-procedure Vital Signs:  Pulse/HCG Rate: 8687 Temp: 97.9 F (36.6 C) Resp: 15 BP: 133/71 SpO2: 99 %  EBL: None  Complications: No immediate post-treatment complications observed by team, or reported by patient.  Note: The patient tolerated the entire procedure well. A repeat set of vitals were taken after the procedure and the patient was kept under observation following institutional policy, for this type of procedure. Post-procedural neurological assessment was performed, showing return to baseline, prior to discharge. The patient was provided with post-procedure discharge instructions, including a section on how to identify potential problems. Should any problems arise concerning this procedure, the patient was given instructions to immediately contact us, at any time, without  hesitation. In any case, we plan to contact the patient by telephone for a follow-up status report regarding this interventional procedure.  Comments:  No additional relevant information.  Plan of Care  Orders:  Orders Placed This Encounter  Procedures  . LUMBAR FACET(MEDIAL BRANCH NERVE BLOCK) MBNB    Scheduling Instructions:     Procedure: Lumbar facet block (AKA.: Lumbosacral medial branch nerve block)     Side: Left-sided     Level: L3-4, L4-5, & L5-S1 Facets (L2, L3, L4, L5, & S1 Medial Branch Nerves)     Sedation: With Sedation.     Timeframe: Today    Order Specific Question:   Where will this procedure be performed?    Answer:   [facility]  . SACROILIAC JOINT INJECTION    Scheduling Instructions:     Side: Left-sided     Sedation: With Sedation.     Timeframe: Today    Order Specific Question:   Where will this procedure be performed?    Answer:   [facility]  . DG PAIN CLINIC C-ARM 1-60 MIN NO REPORT    Intraoperative interpretation by procedural physician at [facility].    Standing Status:   Standing    Number of Occurrences:   1    Order Specific Question:   Reason for exam:    Answer:   Assistance in needle guidance and placement for procedures requiring needle placement in or near specific anatomical locations not easily accessible without such assistance.  . Informed Consent Details: Physician/Practitioner Attestation; Transcribe to consent form and obtain patient signature    Nursing Order: Transcribe to consent form and obtain patient signature. Note: Always confirm laterality of pain with Mr. Bowens, before procedure. Procedure: Lumbar Facet Block  under fluoroscopic guidance Indication/Reason: Low Back Pain, with our without leg pain, due to Facet Joint Arthralgia (Joint Pain) known as Lumbar Facet Syndrome, secondary to Lumbar, and/or Lumbosacral Spondylosis (Arthritis of the Spine), without myelopathy or radiculopathy (Nerve  Damage). Provider Attestation: I, Asante Ritacco A. Laban Emperor, MD, (Pain Management Specialist), the physician/practitioner, attest that I  have discussed with the patient the benefits, risks, side effects, alternatives, likelihood of achieving goals and potential problems during recovery for the procedure that I have provided informed consent.  . Informed Consent Details: Physician/Practitioner Attestation; Transcribe to consent form and obtain patient signature    Provider Attestation: I, Francee Setzer A. Laban Emperor, MD, (Pain Management Specialist), the physician/practitioner, attest that I have discussed with the patient the benefits, risks, side effects, alternatives, likelihood of achieving goals and potential problems during recovery for the procedure that I have provided informed consent.    Scheduling Instructions:     Procedure: Sacroiliac Joint Block     Indication/Reason: Chronic Low Back and Hip Pain secondary to Sacroiliac Joint Pain (Arthralgia/Arthropathy)     Nursing Order: Transcribe to consent form and obtain patient signature.     Note: Always confirm laterality of pain with Mr. Boutelle, before procedure.  . Provide equipment / supplies at bedside    "Block Tray" (Disposable  single use) Needle type: Spinal Amount/quantity: 4 Size: Regular (3-3.5-inch) Gauge: 22G    Standing Status:   Standing    Number of Occurrences:   1    Order Specific Question:   Specify    Answer:   Block Tray   Chronic Opioid Analgesic:  OxycodoneIR10 mg, 1 tab PO q 6 hrs (40 mg/day of oxycodone) MME/day:60mg /day.   Medications ordered for procedure: Meds ordered this encounter  Medications  . lidocaine (XYLOCAINE) 2 % (with pres) injection 400 mg  . lactated ringers infusion 1,000 mL  . midazolam (VERSED) 5 MG/5ML injection 1-2 mg    Make sure Flumazenil is available in the pyxis when using this medication. If oversedation occurs, administer 0.2 mg IV over 15 sec. If after 45 sec no response, administer  0.2 mg again over 1 min; may repeat at 1 min intervals; not to exceed 4 doses (1 mg)  . fentaNYL (SUBLIMAZE) injection 25-50 mcg    Make sure Narcan is available in the pyxis when using this medication. In the event of respiratory depression (RR< 8/min): Titrate NARCAN (naloxone) in increments of 0.1 to 0.2 mg IV at 2-3 minute intervals, until desired degree of reversal.  . ropivacaine (PF) 2 mg/mL (0.2%) (NAROPIN) injection 9 mL  . triamcinolone acetonide (KENALOG-40) injection 40 mg  . methylPREDNISolone acetate (DEPO-MEDROL) injection 80 mg  . ropivacaine (PF) 2 mg/mL (0.2%) (NAROPIN) injection 4 mL   Medications administered: We administered lidocaine, lactated ringers, midazolam, fentaNYL, ropivacaine (PF) 2 mg/mL (0.2%), triamcinolone acetonide, methylPREDNISolone acetate, and ropivacaine (PF) 2 mg/mL (0.2%).  See the medical record for exact dosing, route, and time of administration.  Follow-up plan:   Return in about 2 weeks (around 04/04/2020) for (VV), (PP) Follow-up.       Interventional Pending:      Under consideration:   Diagnostic bilateral lumbar facet block #1 Possible bilateral lumbar facet RFA Diagnostic bilateral sacroiliac joint block #1 Possible bilateral sacroiliac joint RFA Possible spinal cord stimulator trial   Palliative care options:   Palliative right lateral epicondyle (elbow) injection #2  Palliative left Racz procedure #5(targeting the left S2, S3 area) (last done on  09/21/2019) -  the patient indicated that his last Racz procedure did not last but approximately 2-1/2 weeks.  He indicates that he still getting benefit, but it is not 100% relief of the pain like he had with the prior one.  In reviewing both, this last one I did not advance the catheter as far as I did with the prior  one because of the discomfort that the patient had experienced when injecting the medications.  However, the patient indicates that thanks to the IV sedation, he has very  little recall about that discomfort and he much rather have it done that way since it lasted much longer than the last one.     Recent Visits Date Type Provider Dept  03/27/20 Telemedicine Yevette EdwardsAdams, James G, MD [facility]  03/21/20 Procedure visit Delano MetzNaveira, Vinay Ertl, MD [facility]  03/14/20 Telemedicine Delano MetzNaveira, Jacaria Colburn, MD [facility]  02/29/20 Procedure visit Delano MetzNaveira, Saanya Zieske, MD [facility]  02/12/20 Office Visit Delano MetzNaveira, Caston Coopersmith, MD [facility]  Showing recent visits within past 90 days and meeting all other requirements Future Appointments Date Type Provider Dept  04/09/20 Appointment Delano MetzNaveira, Trigg Delarocha, MD [facility]  05/27/20 Appointment Delano MetzNaveira, Kaylea Mounsey, MD [facility]  Showing future appointments within next 90 days and meeting all other requirements  Disposition: Discharge home  Discharge (Date  Time): 03/21/2020; 0912 hrs.   Primary Care Physician: Sherron Mondayejan-Sie, S Ahmed, MD [facility] Note by: Oswaldo DoneFrancisco A Hevin Jeffcoat, MD Date: 03/21/2020; Time: 5:31 AM  Disclaimer:  Medicine is not an exact science. The only guarantee in medicine is that nothing is guaranteed. It is important to note that the decision to proceed with this intervention was based on the information collected from the patient. The Data and conclusions were drawn from the patient's questionnaire, the interview, and the physical examination. Because the information was provided in large part by the patient, it cannot be guaranteed that it has not been purposely or unconsciously manipulated. Every effort has been made to obtain as much relevant data as possible for this evaluation. It is important to note that the conclusions that lead to this procedure are derived in large part from the available data. Always take into account that the treatment will also be dependent on availability of resources and existing  treatment guidelines, considered by other Pain Management Practitioners as being common knowledge and practice, at the time of the intervention. For Medico-Legal purposes, it is also important to point out that variation in procedural techniques and pharmacological choices are the acceptable norm. The indications, contraindications, technique, and results of the above procedure should only be interpreted and judged by a Board-Certified Interventional Pain Specialist with extensive familiarity and expertise in the same exact procedure and technique.

## 2020-03-22 ENCOUNTER — Other Ambulatory Visit: Payer: Self-pay | Admitting: Pain Medicine

## 2020-03-22 ENCOUNTER — Telehealth: Payer: Self-pay

## 2020-03-22 DIAGNOSIS — G894 Chronic pain syndrome: Secondary | ICD-10-CM

## 2020-03-22 NOTE — Telephone Encounter (Signed)
Post procedure follow up.  Patient states he is doing well 

## 2020-03-25 ENCOUNTER — Telehealth: Payer: Self-pay | Admitting: Pain Medicine

## 2020-03-25 NOTE — Telephone Encounter (Addendum)
Patient call stating [name] only sent 1 month of Oxycodone. I looked in chart and there are no scripts for Oxycodone. There were no further instructions about his medications. Can you ask [name] if he will send in script for this please.

## 2020-03-25 NOTE — Telephone Encounter (Signed)
I would schedule him as a virtual with Dr Chaney Malling med management as soon as you can. Thank you

## 2020-03-27 ENCOUNTER — Ambulatory Visit: Payer: BC Managed Care – PPO | Attending: Anesthesiology | Admitting: Anesthesiology

## 2020-03-27 ENCOUNTER — Encounter: Payer: Self-pay | Admitting: Anesthesiology

## 2020-03-27 ENCOUNTER — Other Ambulatory Visit: Payer: Self-pay

## 2020-03-27 DIAGNOSIS — M9904 Segmental and somatic dysfunction of sacral region: Secondary | ICD-10-CM | POA: Diagnosis not present

## 2020-03-27 DIAGNOSIS — M545 Low back pain, unspecified: Secondary | ICD-10-CM

## 2020-03-27 DIAGNOSIS — M533 Sacrococcygeal disorders, not elsewhere classified: Secondary | ICD-10-CM

## 2020-03-27 DIAGNOSIS — G894 Chronic pain syndrome: Secondary | ICD-10-CM

## 2020-03-27 DIAGNOSIS — M25521 Pain in right elbow: Secondary | ICD-10-CM

## 2020-03-27 DIAGNOSIS — M47816 Spondylosis without myelopathy or radiculopathy, lumbar region: Secondary | ICD-10-CM

## 2020-03-27 DIAGNOSIS — M47817 Spondylosis without myelopathy or radiculopathy, lumbosacral region: Secondary | ICD-10-CM | POA: Diagnosis not present

## 2020-03-27 DIAGNOSIS — M961 Postlaminectomy syndrome, not elsewhere classified: Secondary | ICD-10-CM

## 2020-03-27 DIAGNOSIS — M5388 Other specified dorsopathies, sacral and sacrococcygeal region: Secondary | ICD-10-CM

## 2020-03-27 DIAGNOSIS — G8929 Other chronic pain: Secondary | ICD-10-CM

## 2020-03-27 MED ORDER — OXYCODONE HCL 10 MG PO TABS: 10.0000 mg | ORAL_TABLET | Freq: Four times a day (QID) | ORAL | 0 refills | Status: DC

## 2020-03-27 MED ORDER — OXYCODONE HCL 10 MG PO TABS: 10.0000 mg | ORAL_TABLET | Freq: Four times a day (QID) | ORAL | 0 refills | Status: DC | PRN

## 2020-03-27 NOTE — Progress Notes (Signed)
Virtual Visit via Telephone Note  I connected with Derek [name] on 03/27/20 at 12:45 PM EDT by telephone and verified that I am speaking with the correct person using two identifiers.  Location: Patient: Home Provider: [facility] control center   I discussed the limitations, risks, security and privacy concerns of performing an evaluation and management service by telephone and the availability of in person appointments. I also discussed with the patient that there may be a patient responsible charge related to this service. The patient expressed understanding and agreed to proceed.   History of Present Illness: I spoke with Derek [name] today via telephone as we were unable to link up with the video portion of the virtual conference but he reports that he is been doing well with his current regimen.  He reports that he is continuing to get good pain relief with this medication with no untoward side effects.  The quality characteristic and distribution of his low back pain has been stable in nature with no other changes reported.  His bowel and bladder function and lower extremity strength and function have also been stable.  He did receive a short-term supply of opioid from his dentist for some dental work.  He is now overdue for his most recent refill for his chronic pain medications.   Observations/Objective:  Current Outpatient Medications:  .  Calcium Carbonate-Vit D-Min (GNP CALCIUM 1200) 1200-1000 MG-UNIT CHEW, Chew 1,200 mg by mouth daily with breakfast. Take in combination with vitamin D and magnesium., Disp: 90 tablet, Rfl: 3 .  cyclobenzaprine (FLEXERIL) 10 MG tablet, Take 1 tablet (10 mg total) by mouth at bedtime. Must last 60 days, Disp: 90 tablet, Rfl: 1 .  Diclofenac Sodium (PENNSAID TD), Place onto the skin as needed., Disp: , Rfl:  .  Magnesium Oxide 500 MG TABS, Take 1 tablet by mouth 2 (two) times daily., Disp: , Rfl:  .  meloxicam (MOBIC) 15 MG tablet, Take 1 tablet (15 mg  total) by mouth daily., Disp: 30 tablet, Rfl: 4 .  Oxycodone HCl 10 MG TABS, Take 1 tablet (10 mg total) by mouth every 6 (six) hours as needed (Only if needed, not around-the-clock). Must last 30 days, Disp: 120 tablet, Rfl: 0 .  [START ON 04/26/2020] Oxycodone HCl 10 MG TABS, Take 1 tablet (10 mg total) by mouth every 6 (six) hours., Disp: 120 tablet, Rfl: 0 .  tiZANidine (ZANAFLEX) 4 MG tablet, Take 1 tablet (4 mg total) by mouth 2 (two) times daily as needed for muscle spasms (For day-time muscle pain/spasm)., Disp: 60 tablet, Rfl: 5  Assessment and Plan:  1. Lumbar facet syndrome (Bilateral) (L>R)   2. Chronic sacroiliac joint pain (Bilateral) (L>R)   3. Somatic dysfunction of sacroiliac joints (Bilateral)   4. Spondylosis without myelopathy or radiculopathy, lumbosacral region   5. Other specified dorsopathies, sacral and sacrococcygeal region   6. Chronic low back pain (Left) w/o sciatica   7. Chronic elbow pain (Right)   8. Chronic pain syndrome   9. Failed back surgical syndrome (x3)   Based on our discussion today and upon review of the [facility] practitioner database information I feel it is appropriate to refill his medications.  He is doing well with this regimen and has been stable getting good pain relief.  Refills will be written for September 22 and October 22 with return to clinic in 2 months.  No other changes in pain management protocol were initiated today and have encouraged him to  continue follow-up with his primary care physicians for his baseline medical care. Follow Up Instructions:    I discussed the assessment and treatment plan with the patient. The patient was provided an opportunity to ask questions and all were answered. The patient agreed with the plan and demonstrated an understanding of the instructions.   The patient was advised to call back or seek an in-person evaluation if the symptoms worsen or if the condition fails to improve as anticipated.  I  provided 25 minutes of non-face-to-face time during this encounter.   Yevette Edwards, MD

## 2020-04-08 ENCOUNTER — Encounter: Payer: Self-pay | Admitting: Pain Medicine

## 2020-04-08 NOTE — Progress Notes (Signed)
Patient: Derek [name]  Service Category: E/M  Provider: Gaspar Cola, MD  DOB: 1981/06/19  DOS: 04/09/2020  Location: Office  MRN: [id number]  Setting: Ambulatory outpatient  Referring Provider: Jodi Marble, MD  Type: Established Patient  Specialty: Interventional Pain Management  PCP: Jodi Marble, MD  Location: Remote location  Delivery: TeleHealth     Virtual Encounter - Pain Management PROVIDER NOTE: Information contained herein reflects review and annotations entered in association with encounter. Interpretation of such information and data should be left to medically-trained personnel. Information provided to patient can be located elsewhere in the medical record under "Patient Instructions". Document created using STT-dictation technology, any transcriptional errors that may result from process are unintentional.    Contact & Pharmacy Preferred: [phone] Home: [phone] (home) Mobile: [phone] (mobile) E-mail: [email or web address]  [place], [place] [address] [place] [address] Phone: [phone] Fax: [phone]   Pre-screening  Derek [name] offered "in-person" vs "virtual" encounter. He indicated preferring virtual for this encounter.   Reason COVID-19*  Social distancing based on CDC and AMA recommendations.   I contacted Derek [name] on 04/09/2020 via telephone.      I clearly identified myself as Gaspar Cola, MD. I verified that I was speaking with the correct person using two identifiers (Name: Derek [name], and date of birth: [birth year]).  Consent I sought verbal advanced consent from Derek [name] for virtual visit interactions. I informed Derek [name] of possible security and privacy concerns, risks, and limitations associated with providing "not-in-person" medical evaluation and management services. I also informed Derek [name] of the availability of "in-person" appointments. Finally, I  informed him that there would be a charge for the virtual visit and that he could be  personally, fully or partially, financially responsible for it. Derek [name] expressed understanding and agreed to proceed.   Historic Elements   Derek [name] is a 39 y.o. year old, male patient evaluated today after our last contact on 03/25/2020. Derek [name]  has a past medical history of Anxiety, Arthritis, and GERD (gastroesophageal reflux disease). He also  has a past surgical history that includes Back surgery; HAND REIMPLANTED; and Lumbar fusion (11/14). Derek [name] has a current medication list which includes the following prescription(s): cyclobenzaprine, diclofenac sodium, magnesium oxide, meloxicam, oxycodone hcl, [START ON 04/26/2020] oxycodone hcl, tizanidine, and gnp calcium 1200. He  reports that he has been smoking. He has a 16.00 pack-year smoking history. He has never used smokeless tobacco. He reports current alcohol use. He reports that he does not use drugs. Derek [name] is allergic to amoxicillin and vicodin [hydrocodone-acetaminophen].   HPI  Today, he is being contacted for a post-procedure assessment.  Today I had a long conversation with the patient regarding the results of this procedure.  He indicates that at this time, what is hurting the most is the pain in the left lower extremity for which he normally gets treated with a Racz procedure.  The last treatment was on 09/21/2019 and he thinks that it has worn off and he would like to have it repeated.  We will go ahead and put him on the schedule and will take care of this part.  I have also encouraged him to pay attention to the pain that remains after having had the Racz procedure done and I pointed out that that pain in the lower back may be secondary to this lumbar facet  and/or SI joint syndrome.  He understood and accepted and he indicated that he would be pain attention to that.  Post-Procedure Evaluation  Procedure (03/25/2020): Diagnostic left  lumbar facet block #1 + diagnostic left SI joint block #1 under fluoroscopic guidance and IV sedation Pre-procedure pain level: 4/10 Post-procedure: 0/10 (100% relief)  Sedation: Sedation provided.  Effectiveness during initial hour after procedure(Ultra-Short Term Relief):   100%.  Local anesthetic used: Long-acting (4-6 hours) Effectiveness: Defined as any analgesic benefit obtained secondary to the administration of local anesthetics. This carries significant diagnostic value as to the etiological location, or anatomical origin, of the pain. Duration of benefit is expected to coincide with the duration of the local anesthetic used.  Effectiveness during initial 4-6 hours after procedure(Short-Term Relief):   100%.  Long-term benefit: Defined as any relief past the pharmacologic duration of the local anesthetics.  Effectiveness past the initial 6 hours after procedure(Long-Term Relief):   90% x 1.5 wks.  Current benefits: Defined as benefit that persist at this time.   Analgesia:  Back to baseline Function: Back to baseline ROM: Back to baseline  Pharmacotherapy Assessment  Analgesic: OxycodoneIR10 mg, 1 tab PO q 6 hrs (40 mg/day of oxycodone) MME/day:84m/day.   Monitoring: [place] PMP: PDMP reviewed during this encounter.       Pharmacotherapy: No side-effects or adverse reactions reported. Compliance: No problems identified. Effectiveness: Clinically acceptable. Plan: Refer to "POC".  UDS:  Summary  Date Value Ref Range Status  02/12/2020 Note  Final    Comment:    ==================================================================== [employer] Select 13 (MW) ==================================================================== Test                             Result       Flag       Units  Drug Present and Declared for Prescription Verification   Oxycodone                      2323         EXPECTED   ng/mg creat   Oxymorphone                    7355         EXPECTED   ng/mg  creat   Noroxycodone                   2746         EXPECTED   ng/mg creat   Noroxymorphone                 805          EXPECTED   ng/mg creat    Sources of oxycodone are scheduled prescription medications.    Oxymorphone, noroxycodone, and noroxymorphone are expected    metabolites of oxycodone. Oxymorphone is also available as a    scheduled prescription medication.  ==================================================================== Test                      Result    Flag   Units      Ref Range   Creatinine              65               mg/dL      >=20 ==================================================================== Declared Medications:  The flagging and interpretation on this report are based on the  following declared medications.  Unexpected results may arise from  inaccuracies in the declared medications.   **Note: The testing scope of this panel includes these medications:   Oxycodone   **Note: The testing scope of this panel does not include the  following reported medications:   Calcium  Cyclobenzaprine (Flexeril)  Diclofenac  Magnesium (Mag-Ox)  Meloxicam (Mobic)  Tizanidine (Zanaflex) ==================================================================== For clinical consultation, please call [phone]. ====================================================================     Laboratory Chemistry Profile   Renal Lab Results  Component Value Date   BUN 11 01/16/2019   CREATININE 1.08 01/16/2019   BCR 10 01/16/2019   GFRAA 99 01/16/2019   GFRNONAA 86 01/16/2019     Hepatic Lab Results  Component Value Date   AST 24 01/16/2019   ALBUMIN 4.6 01/16/2019   ALKPHOS 108 01/16/2019     Electrolytes Lab Results  Component Value Date   NA 143 01/16/2019   K 4.0 01/16/2019   CL 105 01/16/2019   CALCIUM 10.2 01/16/2019   MG 2.0 01/16/2019     Bone Lab Results  Component Value Date   25OHVITD1 39 01/16/2019   25OHVITD2 1.2 01/16/2019    25OHVITD3 38 01/16/2019     Inflammation (CRP: Acute Phase) (ESR: Chronic Phase) Lab Results  Component Value Date   CRP 4 01/16/2019   ESRSEDRATE 29 (H) 01/16/2019       Note: Above Lab results reviewed.  Imaging  DG PAIN CLINIC C-ARM 1-60 MIN NO REPORT Fluoro was used, but no Radiologist interpretation will be provided.  Please refer to "NOTES" tab for provider progress note.  Assessment  The primary encounter diagnosis was Failed back surgical syndrome (x3). Diagnoses of Epidural fibrosis, Chronic low back pain (Left) w/o sciatica, and Chronic lower extremity pain (2ry area of Pain) (Bilateral) (L>R) were also pertinent to this visit.  Plan of Care  Problem-specific:  No problem-specific Assessment & Plan notes found for this encounter.  Mr. RONTRELL MOQUIN has a current medication list which includes the following long-term medication(s): cyclobenzaprine, meloxicam, oxycodone hcl, [START ON 04/26/2020] oxycodone hcl, tizanidine, and gnp calcium 1200.  Pharmacotherapy (Medications Ordered): No orders of the defined types were placed in this encounter.  Orders:  Orders Placed This Encounter  Procedures  . Racz Epidurolysis    Standing Status:   Future    Standing Expiration Date:   04/09/2021    Scheduling Instructions:     Procedure: RACZ Epidural Lysis of Adhesions     Side: Midline     Sedation: Moderate Conscious Sedation     Timeframe: ASAA    Order Specific Question:   Where will this procedure be performed?    Answer:   [facility]   Follow-up plan:   Return for Procedure (w/ sedation): (L) RACZ procedure #5.      Interventional Pending:      Under consideration:   Diagnostic bilateral lumbar facet block #1 Possible bilateral lumbar facet RFA Diagnostic bilateral sacroiliac joint block #1 Possible bilateral sacroiliac joint RFA Possible spinal cord stimulator trial   Palliative care options:   Palliative right lateral epicondyle (elbow)  injection #2  Palliative left Racz procedure #5(targeting the left S2, S3 area) (last done on  09/21/2019) -  the patient indicated that his last Racz procedure did not last but approximately 2-1/2 weeks.  He indicates that he still getting benefit, but it is not 100% relief of the pain like he had with the prior one.  In reviewing both, this last one I did not  advance the catheter as far as I did with the prior one because of the discomfort that the patient had experienced when injecting the medications.  However, the patient indicates that thanks to the IV sedation, he has very little recall about that discomfort and he much rather have it done that way since it lasted much longer than the last one.    Recent Visits Date Type Provider Dept  03/27/20 Telemedicine Molli Barrows, MD [facility]  03/21/20 Procedure visit Milinda Pointer, MD [facility]  03/14/20 Telemedicine Milinda Pointer, MD [facility]  02/29/20 Procedure visit Milinda Pointer, MD [facility]  02/12/20 Office Visit Milinda Pointer, MD [facility]  Showing recent visits within past 90 days and meeting all other requirements Today's Visits Date Type Provider Dept  04/09/20 Telemedicine Milinda Pointer, MD [facility]  Showing today's visits and meeting all other requirements Future Appointments Date Type Provider Dept  05/27/20 Appointment Milinda Pointer, MD [facility]  Showing future appointments within next 90 days and meeting all other requirements  I discussed the assessment and treatment plan with the patient. The patient was provided an opportunity to ask questions and all were answered. The patient agreed with the plan and demonstrated an understanding of the instructions.  Patient advised to call back or seek an in-person evaluation if the symptoms or condition worsens.  Duration of encounter: 18 minutes.  Note by: Gaspar Cola, MD Date: 04/09/2020; Time: 4:08 PM

## 2020-04-09 ENCOUNTER — Other Ambulatory Visit: Payer: Self-pay

## 2020-04-09 ENCOUNTER — Ambulatory Visit: Payer: BC Managed Care – PPO | Attending: Pain Medicine | Admitting: Pain Medicine

## 2020-04-09 DIAGNOSIS — G8929 Other chronic pain: Secondary | ICD-10-CM

## 2020-04-09 DIAGNOSIS — G96198 Other disorders of meninges, not elsewhere classified: Secondary | ICD-10-CM

## 2020-04-09 DIAGNOSIS — M545 Low back pain, unspecified: Secondary | ICD-10-CM

## 2020-04-09 DIAGNOSIS — M79605 Pain in left leg: Secondary | ICD-10-CM

## 2020-04-09 DIAGNOSIS — M961 Postlaminectomy syndrome, not elsewhere classified: Secondary | ICD-10-CM | POA: Diagnosis not present

## 2020-04-09 DIAGNOSIS — M79604 Pain in right leg: Secondary | ICD-10-CM

## 2020-04-09 NOTE — Progress Notes (Signed)
Pain relief after procedure (treated area only): (Questions asked to patient) 1. Starting about 15 minutes after the procedure, and "while the area was still numb" (from the local anesthetics), were you having any of your usual pain "in that area" (the treated area)?  (NOTE: NOT including the discomfort from the needle sticks.) First 1 hour: 100 % better. First 4-6 hours:90 % better. 2. How long did the numbness from the local anesthetics last? (More than 6 hours?) Duration:4 hours.  3. How much better is your pain now, when compared to before the procedure? Current benefit: 0 % better. 4. Can you move better now? Improvement in ROM (Range of Motion): Yes. 5. Can you do more now? Improvement in function: No. 4. Did you have any problems with the procedure? Side-effects/Complications: No.

## 2020-04-09 NOTE — Patient Instructions (Signed)
____________________________________________________________________________________________  Preparing for Procedure with Sedation  Procedure appointments are limited to planned procedures: . No Prescription Refills. . No disability issues will be discussed. . No medication changes will be discussed.  Instructions: . Oral Intake: Do not eat or drink anything for at least 8 hours prior to your procedure. (Exception: Blood Pressure Medication. See below.) . Transportation: Unless otherwise stated by your physician, you may drive yourself after the procedure. . Blood Pressure Medicine: Do not forget to take your blood pressure medicine with a sip of water the morning of the procedure. If your Diastolic (lower reading)is above 100 mmHg, elective cases will be cancelled/rescheduled. . Blood thinners: These will need to be stopped for procedures. Notify our staff if you are taking any blood thinners. Depending on which one you take, there will be specific instructions on how and when to stop it. . Diabetics on insulin: Notify the staff so that you can be scheduled 1st case in the morning. If your diabetes requires high dose insulin, take only  of your normal insulin dose the morning of the procedure and notify the staff that you have done so. . Preventing infections: Shower with an antibacterial soap the morning of your procedure. . Build-up your immune system: Take 1000 mg of Vitamin C with every meal (3 times a day) the day prior to your procedure. . Antibiotics: Inform the staff if you have a condition or reason that requires you to take antibiotics before dental procedures. . Pregnancy: If you are pregnant, call and cancel the procedure. . Sickness: If you have a cold, fever, or any active infections, call and cancel the procedure. . Arrival: You must be in the facility at least 30 minutes prior to your scheduled procedure. . Children: Do not bring children with you. . Dress appropriately:  Bring dark clothing that you would not mind if they get stained. . Valuables: Do not bring any jewelry or valuables.  Reasons to call and reschedule or cancel your procedure: (Following these recommendations will minimize the risk of a serious complication.) . Surgeries: Avoid having procedures within 2 weeks of any surgery. (Avoid for 2 weeks before or after any surgery). . Flu Shots: Avoid having procedures within 2 weeks of a flu shots or . (Avoid for 2 weeks before or after immunizations). . Barium: Avoid having a procedure within 7-10 days after having had a radiological study involving the use of radiological contrast. (Myelograms, Barium swallow or enema study). . Heart attacks: Avoid any elective procedures or surgeries for the initial 6 months after a "Myocardial Infarction" (Heart Attack). . Blood thinners: It is imperative that you stop these medications before procedures. Let us know if you if you take any blood thinner.  . Infection: Avoid procedures during or within two weeks of an infection (including chest colds or gastrointestinal problems). Symptoms associated with infections include: Localized redness, fever, chills, night sweats or profuse sweating, burning sensation when voiding, cough, congestion, stuffiness, runny nose, sore throat, diarrhea, nausea, vomiting, cold or Flu symptoms, recent or current infections. It is specially important if the infection is over the area that we intend to treat. . Heart and lung problems: Symptoms that may suggest an active cardiopulmonary problem include: cough, chest pain, breathing difficulties or shortness of breath, dizziness, ankle swelling, uncontrolled high or unusually low blood pressure, and/or palpitations. If you are experiencing any of these symptoms, cancel your procedure and contact your primary care physician for an evaluation.  Remember:  Regular Business hours are:    Monday to Thursday 8:00 AM to 4:00 PM  Provider's  Schedule: Makhari Dovidio, MD:  Procedure days: Tuesday and Thursday 7:30 AM to 4:00 PM  Bilal Lateef, MD:  Procedure days: Monday and Wednesday 7:30 AM to 4:00 PM ____________________________________________________________________________________________    

## 2020-04-30 ENCOUNTER — Other Ambulatory Visit: Payer: Self-pay | Admitting: Pain Medicine

## 2020-04-30 DIAGNOSIS — M960 Pseudarthrosis after fusion or arthrodesis: Secondary | ICD-10-CM

## 2020-04-30 DIAGNOSIS — M79605 Pain in left leg: Secondary | ICD-10-CM

## 2020-04-30 DIAGNOSIS — G96198 Other disorders of meninges, not elsewhere classified: Secondary | ICD-10-CM

## 2020-04-30 DIAGNOSIS — G8929 Other chronic pain: Secondary | ICD-10-CM

## 2020-04-30 DIAGNOSIS — M545 Low back pain, unspecified: Secondary | ICD-10-CM

## 2020-04-30 DIAGNOSIS — M961 Postlaminectomy syndrome, not elsewhere classified: Secondary | ICD-10-CM

## 2020-04-30 MED ORDER — STERILE WATER FOR INJECTION IJ SOLN: 10.0000 mL | Freq: Once | INTRAVENOUS | 0 refills | Status: AC

## 2020-04-30 MED ORDER — GENTAMICIN IN SALINE 1.2-0.9 MG/ML-% IV SOLN: 120.0000 mg | Freq: Once | INTRAVENOUS | 0 refills | Status: AC

## 2020-04-30 MED ORDER — HYALURONIDASE HUMAN 150 UNIT/ML IJ SOLN: 1500.0000 [IU] | Freq: Once | INTRAMUSCULAR | 0 refills | Status: AC

## 2020-05-01 NOTE — Progress Notes (Signed)
PROVIDER NOTE: Information contained herein reflects review and annotations entered in association with encounter. Interpretation of such information and data should be left to medically-trained personnel. Information provided to patient can be located elsewhere in the medical record under "Patient Instructions". Document created using STT-dictation technology, any transcriptional errors that may result from process are unintentional.    Patient: Derek [name]  Service Category: Procedure  Provider: Gaspar Cola, MD  DOB: Nov 14, 1980  DOS: 05/02/2020  Location: [facility]  MRN: [id number]  Setting: Ambulatory - outpatient  Referring Provider: Jodi Marble, MD  Type: Established Patient  Specialty: Interventional Pain Management  PCP: Derek Marble, MD   Primary Reason for Visit: Interventional Pain Management Treatment. CC: Back Pain (left buttock)  Procedure:          Anesthesia, Analgesia, Anxiolysis:  Type: Therapeutic Percutaneous Epidural Neuroplasty and Lysis of Adhesions (RACZ Procedure)  #5  Region: Caudal Level: Sacrococcygeal   Laterality: Midline aiming at the left  Type: Moderate (Conscious) Sedation combined with Local Anesthesia Indication(s): Analgesia and Anxiety Route: Intravenous (IV) IV Access: Secured Sedation: Meaningful verbal contact was maintained at all times during the procedure  Local Anesthetic: Lidocaine 1-2%  Position: Prone   Indications: 1. Failed back surgical syndrome (x3)   2. Epidural fibrosis   3. DDD (degenerative disc disease), lumbosacral   4. Chronic low back pain (1ry area of Pain) (Bilateral) (L>R) w/ sciatica (Bilateral)   5. Chronic lower extremity pain (2ry area of Pain) (Bilateral) (L>R)    Pain Score: Pre-procedure: 7 /10 Post-procedure: 1  (left leg)/10   RTCB: 08/24/2020  Pharmacotherapy Assessment   Analgesic: OxycodoneIR10 mg, 1 tab PO q 6 hrs (40 mg/day of oxycodone) MME/day:65m/day.    Monitoring: [place] PMP: PDMP reviewed during this encounter. I did notice that the patient had received some hydrocodone from [facility].  The patient was reminded that it is his responsibility to notify [place] as soon as he receives additional pain medicine for what ever reason.  Today I have provided the patient with another copy of our medication regulations so that he can stop the that again. Pharmacotherapy: No side-effects or adverse reactions reported. Compliance: No problems identified. Effectiveness: Clinically acceptable.  UDS:  Summary  Date Value Ref Range Status  02/12/2020 Note  Final    Comment:    ==================================================================== [employer] [facility] 13 (MW) ==================================================================== Test                             Result       Flag       Units  Drug Present and Declared for Prescription Verification   Oxycodone                      2323         EXPECTED   ng/mg creat   Oxymorphone                    7355         EXPECTED   ng/mg creat   Noroxycodone                   2746         EXPECTED   ng/mg creat   Noroxymorphone                 805  EXPECTED   ng/mg creat    Sources of oxycodone are scheduled prescription medications.    Oxymorphone, noroxycodone, and noroxymorphone are expected    metabolites of oxycodone. Oxymorphone is also available as a    scheduled prescription medication.  ==================================================================== Test                      Result    Flag   Units      Ref Range   Creatinine              65               mg/dL      >=20 ==================================================================== Declared Medications:  The flagging and interpretation on this report are based on the  following declared medications.  Unexpected results may arise from  inaccuracies in the declared medications.   **Note: The testing scope  of this panel includes these medications:   Oxycodone   **Note: The testing scope of this panel does not include the  following reported medications:   Calcium  Cyclobenzaprine (Flexeril)  Diclofenac  Magnesium (Mag-Ox)  Meloxicam (Mobic)  Tizanidine (Zanaflex) ==================================================================== For clinical consultation, please call [phone]. ====================================================================      Pre-op Assessment:  Derek [name] is a 39 y.o. (year old), male patient, seen today for interventional treatment. He  has a past surgical history that includes Back surgery; HAND REIMPLANTED; and Lumbar fusion (11/14). Derek [name] has a current medication list which includes the following prescription(s): gnp calcium 1200, cyclobenzaprine, diclofenac sodium, magnesium oxide, meloxicam, [START ON 05/26/2020] oxycodone hcl, [START ON 06/25/2020] oxycodone hcl, [START ON 07/25/2020] oxycodone hcl, and tizanidine, and the following Facility-Administered Medications: fentanyl and midazolam. His primarily concern today is the Back Pain (left buttock)  Initial Vital Signs:  Pulse/HCG Rate: 98ECG Heart Rate: 88 Temp: (!) 97.2 F (36.2 C) Resp: 16 BP: 125/83 SpO2: 99 %  BMI: Estimated body mass index is 25.97 kg/m as calculated from the following:   Height as of this encounter: '5\' 10"'  (1.778 m).   Weight as of this encounter: 181 lb (82.1 kg).  Risk Assessment: Allergies: Reviewed. He is allergic to amoxicillin and vicodin [hydrocodone-acetaminophen].  Allergy Precautions: None required Coagulopathies: Reviewed. None identified.  Blood-thinner therapy: None at this time Active Infection(s): Reviewed. None identified. Derek [name] is afebrile  Site Confirmation: Derek [name] was asked to confirm the procedure and laterality before marking the site Procedure checklist: Completed Consent: Before the procedure and under the influence of no  sedative(s), amnesic(s), or anxiolytics, the patient was informed of the treatment options, risks and possible complications. To fulfill our ethical and legal obligations, as recommended by the American Medical Association's Code of Ethics, I have informed the patient of my clinical impression; the nature and purpose of the treatment or procedure; the risks, benefits, and possible complications of the intervention; the alternatives, including doing nothing; the risk(s) and benefit(s) of the alternative treatment(s) or procedure(s); and the risk(s) and benefit(s) of doing nothing. The patient was provided information about the general risks and possible complications associated with the procedure. These may include, but are not limited to: failure to achieve desired goals, infection, bleeding, organ or nerve damage, allergic reactions, paralysis, and death. In addition, the patient was informed of those risks and complications associated to Spine-related procedures, such as failure to decrease pain; infection (i.e.: Meningitis, epidural or intraspinal abscess); bleeding (i.e.: epidural hematoma, subarachnoid hemorrhage, or any other type of intraspinal or peri-dural bleeding);  organ or nerve damage (i.e.: Any type of peripheral nerve, nerve root, or spinal cord injury) with subsequent damage to sensory, motor, and/or autonomic systems, resulting in permanent pain, numbness, and/or weakness of one or several areas of the body; allergic reactions; (i.e.: anaphylactic reaction); and/or death. Furthermore, the patient was informed of those risks and complications associated with the medications. These include, but are not limited to: allergic reactions (i.e.: anaphylactic or anaphylactoid reaction(s)); adrenal axis suppression; blood sugar elevation that in diabetics may result in ketoacidosis or comma; water retention that in patients with history of congestive heart failure may result in shortness of breath,  pulmonary edema, and decompensation with resultant heart failure; weight gain; swelling or edema; medication-induced neural toxicity; particulate matter embolism and blood vessel occlusion with resultant organ, and/or nervous system infarction; and/or aseptic necrosis of one or more joints. Finally, the patient was informed that Medicine is not an exact science; therefore, there is also the possibility of unforeseen or unpredictable risks and/or possible complications that may result in a catastrophic outcome. The patient indicated having understood very clearly. We have given the patient no guarantees and we have made no promises. Enough time was given to the patient to ask questions, all of which were answered to the patient's satisfaction. Mr. Littles has indicated that he wanted to continue with the procedure. Attestation: I, the ordering provider, attest that I have discussed with the patient the benefits, risks, side-effects, alternatives, likelihood of achieving goals, and potential problems during recovery for the procedure that I have provided informed consent. Date  Time: 05/02/2020  8:00 AM  Pre-Procedure Preparation:  Monitoring: As per clinic protocol. Respiration, ETCO2, SpO2, BP, heart rate and rhythm monitor placed and checked for adequate function Safety Precautions: Patient was assessed for positional comfort and pressure points before starting the procedure. Time-out: I initiated and conducted the "Time-out" before starting the procedure, as per protocol. The patient was asked to participate by confirming the accuracy of the "Time Out" information. Verification of the correct person, site, and procedure were performed and confirmed by me, the nursing staff, and the patient. "Time-out" conducted as per Joint Commission's Universal Protocol (UP.01.01.01). Time: 0847  Description of Procedure:          Target Area: Caudal Epidural Canal Approach: Midline approach Area Prepped: Entire  Posterior Sacrococcygeal Region DuraPrep (Iodine Povacrylex [0.7% available iodine] and Isopropyl Alcohol, 74% w/w) Safety Precautions: Aspiration looking for blood return was conducted prior to all injections. At no point did I inject any substances, as a needle was being advanced. No attempts were made at seeking any paresthesias. Safe injection practices and needle disposal techniques used. Medications properly checked for expiration dates. SDV (single dose vial) medications used. Description of the Procedure: Protocol guidelines were followed. The patient was placed in position over the fluoroscopy table. Patient assessed for comfort and pressure points before starting procedure. The target area was identified and the area prepped in the usual manner. Skin & deeper tissues infiltrated with local anesthetic. Appropriate amount of time allowed to pass for local anesthetics to take effect. The epidural needle was then advanced to the target area, via the sacral hiatus, between the sacral cornu. Proper needle placement secured. Negative aspiration confirmed. Step 1: Epidurogram performed by slowly injecting a non-ionic, water-soluble, hypoallergenic, myelogram-compatible radiological contrast. Defect identified and Racz catheter advanced slowly next to rest proximal to it without attempting to perforate or puncture the defect. At this point, the epidural needle was removed. Step 2: Contrast was  again injected, this time thru the catheter, under live fluoroscopy, closely observing for vascular uptake or intrathecal spread. Step 3: Once no vascular uptake or intrathecal spread was confirmed, a 2 mL test-dose using 2% PF-Lidocaine with 1:200,000 Epinephrine was injected thru the catheter, while closely monitoring for an increase in heart rate or evidence of spinal anesthesia. Step 4: After waiting over 5 minutes, the patient was assessed for evidence of a spinal block. Step 5: Once I had confirmed that there  was no vascular uptake or evidence of intrathecal spread, I then slowly injected 1,500 Units of hyaluronidase, carefully monitoring for allergic reactions. I then waited 10 minutes, using this time to secure the catheter using a sterile benzoin tincture swab and (60m x 100 mm) steri-strip. After confirming vitals to be stable, the patient was transferred to the recovery area where Mr. JBrahmwas kept under constant observation and monitored as per post-sedation protocol. Step 6: After the 10 minutes, I proceeded to slowly inject a solution containing 0.2% MPF-Ropivacaine (4 mL) + 0.9% PF-NSS (5 mL) + SDV Triamcinolone 40 mg/mL (1 mL), in intermittent fashion, asking for systemic symptoms every 0.5cc of injectate. Step 7:  30 minutes later, a neurological exam was conducted for any evidence of a spinal blockade. Step 8:  After confirming the absence of anesthesia, I slowly injected the 10% PF-Hypertonic Saline, stopping to assess, any time the patient described any discomfort. Once the procedure was completed, I removed the catheter, taking time to show the patient its spring tip, and demonstrating to the patient that none had been left behind. EBL: None Materials & Medications used:  1. Racz Tun-L-Kath (Epimed, [place] Catheter. (or similar)(Perifix 20Gx100cm) 2. 2" Foam Tape 3. Benzoin tincture swab 4. Steri-Strip (157mx 100 mm) 5. Non-occlusive Transparent Dressing (3MT TegadermT) 6. Bacteriostatic Filter for Epidural Catheter 7. Epidural Kit for 20G catheter Needle(s) used: 20g - 10cm, Tuohy-style epidural needle   Medications used:  1. Skin infiltration: 2.0% Lidocaine (1039m2. Test-dose: 1.5 % Lidocaine w/ Epi 1:200,000 (5ml88m. Epidural Steroid injection:  A). Steroid: Triamcinolone 40 mg/mL (SDV) (1ml)61m. Local Anesthetic: 0.2% PF-Ropivacaine (2 mg/mL) (4 mL) C). Dilution agent: 0.9% PF-NSS (injectable saline) (5 mL) 6. Neurolytic Agent: 10% PF-Sodium Chloride (Hypertonic  Saline) (10ml)80m.4% PF-Sodium Chloride (4.273mL) 17m-Sterile H2O (5.727mL) =20m PF-Sodium Chloride (10mL)] 726mar softening agent: Hyaluronidase (150 units/mL) x (10 mL) = 1500 Units 8. Radiological Contrast Media: Isovue-M 200 (10 mL)  Vitals:   05/02/20 1013 05/02/20 1023 05/02/20 1033 05/02/20 1042  BP: (!) 120/57 106/70 (!) 101/55 (!) 97/54  Pulse:      Resp: '11 12 19 ' (!) 9  Temp:    97.6 F (36.4 C)  TempSrc:    Temporal  SpO2: 98% 98% 98% 97%  Weight:      Height:        Start Time: 0847 hrs. End Time: 0912 hrs. Epidurogram:           Epidurogram flow pattern demonstrated a restricted low at the level of the surgery. The epidural catheter was placed next to this restriction without attempting to perforate it.  Initially injection of contrast can be seen staying on the right side of the caudal epidural space with no contrast going into any of the nerve roots on the left side.  Once the catheter was inserted and driven into the area of the left L5-S1 region and contrast was injected, then clear delineation of the nerve roots can be  seen in that area including the left L5, S1, and S2 nerve roots. Materials:  Needle(s) Type: Epidural needle Gauge: 20G Length: 3.5-in Medication(s): Please see orders for medications and dosing details.  Imaging Guidance (Spinal):          Type of Imaging Technique: Fluoroscopy Guidance (Spinal) Indication(s): Assistance in needle guidance and placement for procedures requiring needle placement in or near specific anatomical locations not easily accessible without such assistance. Exposure Time: Please see nurses notes. Contrast: Before injecting any contrast, we confirmed that the patient did not have an allergy to iodine, shellfish, or radiological contrast. Once satisfactory needle placement was completed at the desired level, radiological contrast was injected. Contrast injected under live fluoroscopy. No contrast complications. See chart for  type and volume of contrast used. Fluoroscopic Guidance: I was personally present during the use of fluoroscopy. "Tunnel Vision Technique" used to obtain the best possible view of the target area. Parallax error corrected before commencing the procedure. "Direction-depth-direction" technique used to introduce the needle under continuous pulsed fluoroscopy. Once target was reached, antero-posterior, oblique, and lateral fluoroscopic projection used confirm needle placement in all planes. Images permanently stored in EMR. Interpretation: I personally interpreted the imaging intraoperatively. Adequate needle placement confirmed in multiple planes. Appropriate spread of contrast into desired area was observed. No evidence of afferent or efferent intravascular uptake. No intrathecal or subarachnoid spread observed. Permanent images saved into the patient's record.  Antibiotic Prophylaxis:   Anti-infectives (From admission, onward)   Start     Dose/Rate Route Frequency Ordered Stop   05/02/20 0815  gentamicin (GARAMYCIN) 120 mg in dextrose 5 % 50 mL IVPB        120 mg 106 mL/hr over 30 Minutes Intravenous  Once 05/02/20 0802 05/02/20 0900     Indication(s): None identified  Post-operative Assessment:  Post-procedure Vital Signs:  Pulse/HCG Rate: 9875 Temp: 97.6 F (36.4 C) Resp: (!) 9 BP: (!) 97/54 SpO2: 97 %  EBL: None  Complications: No immediate post-treatment complications observed by team, or reported by patient.  Note: The patient tolerated the entire procedure well. A repeat set of vitals were taken after the procedure and the patient was kept under observation following institutional policy, for this type of procedure. Post-procedural neurological assessment was performed, showing return to baseline, prior to discharge. The patient was provided with post-procedure discharge instructions, including a section on how to identify potential problems. Should any problems arise concerning this  procedure, the patient was given instructions to immediately contact us, at any time, without hesitation. In any case, we plan to contact the patient by telephone for a follow-up status report regarding this interventional procedure.  Comments:  No additional relevant information.  Plan of Care  Orders:  Orders Placed This Encounter  Procedures  . Racz (One Day)    Equipment: Epidural Tray; Racz Catheter; 10% Hypertonic Saline; Hyaluronidase; 3/4" Steri-strips; 4x4 Sterile Gauze pack.    Scheduling Instructions:     Procedure: RACZ Epidural Lysis of Adhesions     Side: Midline     Sedation: Moderate Conscious Sedation     Timeframe: Today  . DG PAIN CLINIC C-ARM 1-60 MIN NO REPORT    Intraoperative interpretation by procedural physician at [place].    Standing Status:   Standing    Number of Occurrences:   1    Order Specific Question:   Reason for exam:    Answer:   Assistance in needle guidance and placement for procedures requiring needle placement  in or near specific anatomical locations not easily accessible without such assistance.  . Informed Consent Details: Physician/Practitioner Attestation; Transcribe to consent form and obtain patient signature    Nursing Order: Transcribe to consent form and obtain patient signature. Note: Always confirm laterality of pain with Mr. Knecht, before procedure.    Order Specific Question:   Physician/Practitioner attestation of informed consent for procedure/surgical case    Answer:   I, the physician/practitioner, attest that I have discussed with the patient the benefits, risks, side effects, alternatives, likelihood of achieving goals and potential problems during recovery for the procedure that I have provided informed consent.    Order Specific Question:   Procedure    Answer:   RACZ Procedure (Epidural Lysis of Adhesions)    Order Specific Question:   Physician/Practitioner performing the procedure    Answer:   Livvy Spilman A.  Dossie Arbour, MD    Order Specific Question:   Indication/Reason    Answer:   Chronic low back pain and lower extremity pain secondary to epidural fibrosis associated with a failed back surgical syndrome  . Provide equipment / supplies at bedside    "Epidural Tray" (Disposable  single use) Catheter: NOT required    Standing Status:   Standing    Number of Occurrences:   1    Order Specific Question:   Specify    Answer:   Epidural Tray   Chronic Opioid Analgesic:  OxycodoneIR10 mg, 1 tab PO q 6 hrs (40 mg/day of oxycodone) MME/day:61m/day.   Medications ordered for procedure: Meds ordered this encounter  Medications  . iohexol (OMNIPAQUE) 180 MG/ML injection 10 mL    Must be Myelogram-compatible. If not available, you may substitute with a water-soluble, non-ionic, hypoallergenic, myelogram-compatible radiological contrast medium.  .Marland Kitchenlidocaine (XYLOCAINE) 2 % (with pres) injection 400 mg  . lactated ringers infusion 1,000 mL  . midazolam (VERSED) 5 MG/5ML injection 1-2 mg    Make sure Flumazenil is available in the pyxis when using this medication. If oversedation occurs, administer 0.2 mg IV over 15 sec. If after 45 sec no response, administer 0.2 mg again over 1 min; may repeat at 1 min intervals; not to exceed 4 doses (1 mg)  . fentaNYL (SUBLIMAZE) injection 25-50 mcg    Make sure Narcan is available in the pyxis when using this medication. In the event of respiratory depression (RR< 8/min): Titrate NARCAN (naloxone) in increments of 0.1 to 0.2 mg IV at 2-3 minute intervals, until desired degree of reversal.  . gentamicin (GARAMYCIN) 120 mg in dextrose 5 % 50 mL IVPB    Order Specific Question:   Antibiotic Indication:    Answer:   Surgical Prophylaxis  . lidocaine-EPINEPHrine (XYLOCAINE W/EPI) 2 %-1:200000 (PF) injection 10 mL    If 2% is unavailable, may be substituted with 1.5%, but must be preservative-free. If 1:200,000 concentration of epinephrine is not available, it may be  substituted with 1:100,000. Notify physician of changes, before procedure.  . ropivacaine (PF) 2 mg/mL (0.2%) (NAROPIN) injection 4 mL  . triamcinolone acetonide (KENALOG-40) injection 40 mg  . hyaluronidase Human (HYLENEX) injection 1,500 Units    10 mL of the 150 Units/mL  . sodium chloride hypertonic 10% epidural injection    For Racz Epidural Lysis of Adhesions.  . sodium chloride flush (NS) 0.9 % injection 4 mL  . Oxycodone HCl 10 MG TABS    Sig: Take 1 tablet (10 mg total) by mouth every 6 (six) hours as needed. Must last 30  days    Dispense:  120 tablet    Refill:  0    Chronic Pain: STOP Act (Not applicable) Fill 1 day early if closed on refill date. Avoid benzodiazepines within 8 hours of opioids  . Oxycodone HCl 10 MG TABS    Sig: Take 1 tablet (10 mg total) by mouth every 6 (six) hours as needed. Must last 30 days    Dispense:  120 tablet    Refill:  0    Chronic Pain: STOP Act (Not applicable) Fill 1 day early if closed on refill date. Avoid benzodiazepines within 8 hours of opioids  . Oxycodone HCl 10 MG TABS    Sig: Take 1 tablet (10 mg total) by mouth every 6 (six) hours as needed. Must last 30 days    Dispense:  120 tablet    Refill:  0    Chronic Pain: STOP Act (Not applicable) Fill 1 day early if closed on refill date. Avoid benzodiazepines within 8 hours of opioids   Medications administered: We administered iohexol, lidocaine, lactated ringers, fentaNYL, gentamicin (GARAMYCIN) 120 mg in dextrose 5 % 50 mL IVPB, lidocaine-EPINEPHrine, ropivacaine (PF) 2 mg/mL (0.2%), triamcinolone acetonide, hyaluronidase Human, sodium chloride hypertonic 10% epidural injection, and sodium chloride flush.  See the medical record for exact dosing, route, and time of administration.  Follow-up plan:   Return in about 2 weeks (around 05/16/2020) for (VV), (PP) Follow-up.       Interventional Pending:      Under consideration:   Diagnostic bilateral lumbar facet block #1 Possible  bilateral lumbar facet RFA Diagnostic bilateral sacroiliac joint block #1 Possible bilateral sacroiliac joint RFA Possible spinal cord stimulator trial   Palliative care options:   Palliative right lateral epicondyle (elbow) injection #2  Palliative left Racz procedure #5(targeting the left S2, S3 area) (last done on  09/21/2019) -  the patient indicated that his last Racz procedure did not last but approximately 2-1/2 weeks.  He indicates that he still getting benefit, but it is not 100% relief of the pain like he had with the prior one.  In reviewing both, this last one I did not advance the catheter as far as I did with the prior one because of the discomfort that the patient had experienced when injecting the medications.  However, the patient indicates that thanks to the IV sedation, he has very little recall about that discomfort and he much rather have it done that way since it lasted much longer than the last one.     Recent Visits Date Type Provider Dept  04/09/20 Telemedicine Milinda Pointer, MD [facility]  03/27/20 Telemedicine Molli Barrows, MD [facility]  03/21/20 Procedure visit Milinda Pointer, MD [facility]  03/14/20 Telemedicine Milinda Pointer, MD [facility]  02/29/20 Procedure visit Milinda Pointer, MD [facility]  02/12/20 Office Visit Milinda Pointer, MD [facility]  Showing recent visits within past 90 days and meeting all other requirements Today's Visits Date Type Provider Dept  05/02/20 Procedure visit Milinda Pointer, MD [facility]  Showing today's visits and meeting all other requirements Future Appointments Date Type Provider Dept  05/16/20 Appointment Milinda Pointer, MD [facility]  Showing future appointments within next 90 days and meeting all other requirements  Disposition: Discharge home  Discharge (Date  Time): 05/02/2020; 1045 hrs.    Primary Care Physician: Derek Marble, MD Location: [facility] Note by: Derek Cola, MD  Date: 05/02/2020; Time: 10:46 AM  Disclaimer:  Medicine is not an Chief Strategy Officer. The only guarantee in medicine is that nothing is guaranteed. It is important to note that the decision to proceed with this intervention was based on the information collected from the patient. The Data and conclusions were drawn from the patient's questionnaire, the interview, and the physical examination. Because the information was provided in large part by the patient, it cannot be guaranteed that it has not been purposely or unconsciously manipulated. Every effort has been made to obtain as much relevant data as possible for this evaluation. It is important to note that the conclusions that lead to this procedure are derived in large part from the available data. Always take into account that the treatment will also be dependent on availability of resources and existing treatment guidelines, considered by other Pain Management Practitioners as being common knowledge and practice, at the time of the intervention. For Medico-Legal purposes, it is also important to point out that variation in procedural techniques and pharmacological choices are the acceptable norm. The indications, contraindications, technique, and results of the above procedure should only be interpreted and judged by a Board-Certified Interventional Pain Specialist with extensive familiarity and expertise in the same exact procedure and technique.

## 2020-05-01 NOTE — Patient Instructions (Addendum)
____________________________________________________________________________________________  Medication Rules  Purpose: To inform patients, and their family members, of our rules and regulations.  Applies to: All patients receiving prescriptions (written or electronic).  Pharmacy of record: Pharmacy where electronic prescriptions will be sent. If written prescriptions are taken to a different pharmacy, please inform the nursing staff. The pharmacy listed in the electronic medical record should be the one where you would like electronic prescriptions to be sent.  Electronic prescriptions: In compliance with the [place] Strengthen Opioid Misuse Prevention (STOP) Act of 2017 (Session Law 2017-74/H243), effective July 06, 2018, all controlled substances must be electronically prescribed. Calling prescriptions to the pharmacy will cease to exist.  Prescription refills: Only during scheduled appointments. Applies to all prescriptions.  NOTE: The following applies primarily to controlled substances (Opioid* Pain Medications).   Type of encounter (visit): For patients receiving controlled substances, face-to-face visits are required. (Not an option or up to the patient.)  Patient's responsibilities: 1. Pain Pills: Bring all pain pills to every appointment (except for procedure appointments). 2. Pill Bottles: Bring pills in original pharmacy bottle. Always bring the newest bottle. Bring bottle, even if empty. 3. Medication refills: You are responsible for knowing and keeping track of what medications you take and those you need refilled. The day before your appointment: write a list of all prescriptions that need to be refilled. The day of the appointment: give the list to the admitting nurse. Prescriptions will be written only during appointments. No prescriptions will be written on procedure days. If you forget a medication: it will not be "Called in", "Faxed", or "electronically sent".  You will need to get another appointment to get these prescribed. No early refills. Do not call asking to have your prescription filled early. 4. Prescription Accuracy: You are responsible for carefully inspecting your prescriptions before leaving our office. Have the discharge nurse carefully go over each prescription with you, before taking them home. Make sure that your name is accurately spelled, that your address is correct. Check the name and dose of your medication to make sure it is accurate. Check the number of pills, and the written instructions to make sure they are clear and accurate. Make sure that you are given enough medication to last until your next medication refill appointment. 5. Taking Medication: Take medication as prescribed. When it comes to controlled substances, taking less pills or less frequently than prescribed is permitted and encouraged. Never take more pills than instructed. Never take medication more frequently than prescribed.  6. Inform other Doctors: Always inform, all of your healthcare providers, of all the medications you take. 7. Pain Medication from other Providers: You are not allowed to accept any additional pain medication from any other Doctor or Healthcare provider. There are two exceptions to this rule. (see below) In the event that you require additional pain medication, you are responsible for notifying us, as stated below. 8. Medication Agreement: You are responsible for carefully reading and following our Medication Agreement. This must be signed before receiving any prescriptions from our practice. Safely store a copy of your signed Agreement. Violations to the Agreement will result in no further prescriptions. (Additional copies of our Medication Agreement are available upon request.) 9. Laws, Rules, & Regulations: All patients are expected to follow all Federal and State Laws, Statutes, Rules, & Regulations. Ignorance of the Laws does not constitute a  valid excuse.  10. Illegal drugs and Controlled Substances: The use of illegal substances (including, but not limited to marijuana and its   derivatives) and/or the illegal use of any controlled substances is strictly prohibited. Violation of this rule may result in the immediate and permanent discontinuation of any and all prescriptions being written by our practice. The use of any illegal substances is prohibited. 11. Adopted CDC guidelines & recommendations: Target dosing levels will be at or below 60 MME/day. Use of benzodiazepines** is not recommended.  Exceptions: There are only two exceptions to the rule of not receiving pain medications from other Healthcare Providers. 1. Exception #1 (Emergencies): In the event of an emergency (i.e.: accident requiring emergency care), you are allowed to receive additional pain medication. However, you are responsible for: As soon as you are able, call our office [phone], at any time of the day or night, and leave a message stating your name, the date and nature of the emergency, and the name and dose of the medication prescribed. In the event that your call is answered by a member of our staff, make sure to document and save the date, time, and the name of the person that took your information.  2. Exception #2 (Planned Surgery): In the event that you are scheduled by another doctor or dentist to have any type of surgery or procedure, you are allowed (for a period no longer than 30 days), to receive additional pain medication, for the acute post-op pain. However, in this case, you are responsible for picking up a copy of our "Post-op Pain Management for Surgeons" handout, and giving it to your surgeon or dentist. This document is available at our office, and does not require an appointment to obtain it. Simply go to our office during business hours (Monday-Thursday from 8:00 AM to 4:00 PM) (Friday 8:00 AM to 12:00 Noon) or if you have a scheduled appointment  with [place], prior to your surgery, and ask for it by name. In addition, you are responsible for: calling our office [phone], at any time of the day or night, and leaving a message stating your name, name of your surgeon, type of surgery, and date of procedure or surgery. Failure to comply with your responsibilities may result in termination of therapy involving the controlled substances.  *Opioid medications include: morphine, codeine, oxycodone, oxymorphone, hydrocodone, hydromorphone, meperidine, tramadol, tapentadol, buprenorphine, fentanyl, methadone. **Benzodiazepine medications include: diazepam (Valium), alprazolam (Xanax), clonazepam (Klonopine), lorazepam (Ativan), clorazepate (Tranxene), chlordiazepoxide (Librium), estazolam (Prosom), oxazepam (Serax), temazepam (Restoril), triazolam (Halcion) (Last updated: 03/12/2020) ____________________________________________________________________________________________    ____________________________________________________________________________________________  Post-Procedure Discharge Instructions  Instructions:  Apply ice:   Purpose: This will minimize any swelling and discomfort after procedure.   When: Day of procedure, as soon as you get home.  How: Fill a plastic sandwich bag with crushed ice. Cover it with a small towel and apply to injection site.  How long: (15 min on, 15 min off) Apply for 15 minutes then remove x 15 minutes.  Repeat sequence on day of procedure, until you go to bed.  Apply heat:   Purpose: To treat any soreness and discomfort from the procedure.  When: Starting the next day after the procedure.  How: Apply heat to procedure site starting the day following the procedure.  How long: May continue to repeat daily, until discomfort goes away.  Food intake: Start with clear liquids (like water) and advance to regular food, as tolerated.   Physical activities: Keep activities to a minimum for the  first 8 hours after the procedure. After that, then as tolerated.  Driving: If you have received any sedation,  be responsible and do not drive. You are not allowed to drive for 24 hours after having sedation.  Blood thinner: (Applies only to those taking blood thinners) You may restart your blood thinner 6 hours after your procedure.  Insulin: (Applies only to Diabetic patients taking insulin) As soon as you can eat, you may resume your normal dosing schedule.  Infection prevention: Keep procedure site clean and dry. Shower daily and clean area with soap and water.  Post-procedure Pain Diary: Extremely important that this be done correctly and accurately. Recorded information will be used to determine the next step in treatment. For the purpose of accuracy, follow these rules:  Evaluate only the area treated. Do not report or include pain from an untreated area. For the purpose of this evaluation, ignore all other areas of pain, except for the treated area.  After your procedure, avoid taking a long nap and attempting to complete the pain diary after you wake up. Instead, set your alarm clock to go off every hour, on the hour, for the initial 8 hours after the procedure. Document the duration of the numbing medicine, and the relief you are getting from it.  Do not go to sleep and attempt to complete it later. It will not be accurate. If you received sedation, it is likely that you were given a medication that may cause amnesia. Because of this, completing the diary at a later time may cause the information to be inaccurate. This information is needed to plan your care.  Follow-up appointment: Keep your post-procedure follow-up evaluation appointment after the procedure (usually 2 weeks for most procedures, 6 weeks for radiofrequencies). DO NOT FORGET to bring you pain diary with you.   Expect: (What should I expect to see with my procedure?)  From numbing medicine (AKA: Local Anesthetics):  Numbness or decrease in pain. You may also experience some weakness, which if present, could last for the duration of the local anesthetic.  Onset: Full effect within 15 minutes of injected.  Duration: It will depend on the type of local anesthetic used. On the average, 1 to 8 hours.   From steroids (Applies only if steroids were used): Decrease in swelling or inflammation. Once inflammation is improved, relief of the pain will follow.  Onset of benefits: Depends on the amount of swelling present. The more swelling, the longer it will take for the benefits to be seen. In some cases, up to 10 days.  Duration: Steroids will stay in the system x 2 weeks. Duration of benefits will depend on multiple posibilities including persistent irritating factors.  Side-effects: If present, they may typically last 2 weeks (the duration of the steroids).  Frequent: Cramps (if they occur, drink Gatorade and take over-the-counter Magnesium 450-500 mg once to twice a day); water retention with temporary weight gain; increases in blood sugar; decreased immune system response; increased appetite.  Occasional: Facial flushing (red, warm cheeks); mood swings; menstrual changes.  Uncommon: Long-term decrease or suppression of natural hormones; bone thinning. (These are more common with higher doses or more frequent use. This is why we prefer that our patients avoid having any injection therapies in other practices.)   Very Rare: Severe mood changes; psychosis; aseptic necrosis.  From procedure: Some discomfort is to be expected once the numbing medicine wears off. This should be minimal if ice and heat are applied as instructed.  Call if: (When should I call?)  You experience numbness and weakness that gets worse with time, as opposed to  wearing off.  New onset bowel or bladder incontinence. (Applies only to procedures done in the spine)  Emergency Numbers:  Durning business hours (Monday - Thursday, 8:00 AM  - 4:00 PM) (Friday, 9:00 AM - 12:00 Noon): [phone]  After hours: [phone]  NOTE: If you are having a problem and are unable connect with, or to talk to a provider, then go to your nearest urgent care or emergency department. If the problem is serious and urgent, please call 911. ____________________________________________________________________________________________   ____________________________________________________________________________________________  Post-Procedure Discharge Instructions  Instructions:  Apply ice:   Purpose: This will minimize any swelling and discomfort after procedure.   When: Day of procedure, as soon as you get home.  How: Fill a plastic sandwich bag with crushed ice. Cover it with a small towel and apply to injection site.  How long: (15 min on, 15 min off) Apply for 15 minutes then remove x 15 minutes.  Repeat sequence on day of procedure, until you go to bed.  Apply heat:   Purpose: To treat any soreness and discomfort from the procedure.  When: Starting the next day after the procedure.  How: Apply heat to procedure site starting the day following the procedure.  How long: May continue to repeat daily, until discomfort goes away.  Food intake: Start with clear liquids (like water) and advance to regular food, as tolerated.   Physical activities: Keep activities to a minimum for the first 8 hours after the procedure. After that, then as tolerated.  Driving: If you have received any sedation, be responsible and do not drive. You are not allowed to drive for 24 hours after having sedation.  Blood thinner: (Applies only to those taking blood thinners) You may restart your blood thinner 6 hours after your procedure.  Insulin: (Applies only to Diabetic patients taking insulin) As soon as you can eat, you may resume your normal dosing schedule.  Infection prevention: Keep procedure site clean and dry. Shower daily and clean area  with soap and water.  Post-procedure Pain Diary: Extremely important that this be done correctly and accurately. Recorded information will be used to determine the next step in treatment. For the purpose of accuracy, follow these rules:  Evaluate only the area treated. Do not report or include pain from an untreated area. For the purpose of this evaluation, ignore all other areas of pain, except for the treated area.  After your procedure, avoid taking a long nap and attempting to complete the pain diary after you wake up. Instead, set your alarm clock to go off every hour, on the hour, for the initial 8 hours after the procedure. Document the duration of the numbing medicine, and the relief you are getting from it.  Do not go to sleep and attempt to complete it later. It will not be accurate. If you received sedation, it is likely that you were given a medication that may cause amnesia. Because of this, completing the diary at a later time may cause the information to be inaccurate. This information is needed to plan your care.  Follow-up appointment: Keep your post-procedure follow-up evaluation appointment after the procedure (usually 2 weeks for most procedures, 6 weeks for radiofrequencies). DO NOT FORGET to bring you pain diary with you.   Expect: (What should I expect to see with my procedure?)  From numbing medicine (AKA: Local Anesthetics): Numbness or decrease in pain. You may also experience some weakness, which if present, could last for the duration of  the local anesthetic.  Onset: Full effect within 15 minutes of injected.  Duration: It will depend on the type of local anesthetic used. On the average, 1 to 8 hours.   From steroids (Applies only if steroids were used): Decrease in swelling or inflammation. Once inflammation is improved, relief of the pain will follow.  Onset of benefits: Depends on the amount of swelling present. The more swelling, the longer it will take for the  benefits to be seen. In some cases, up to 10 days.  Duration: Steroids will stay in the system x 2 weeks. Duration of benefits will depend on multiple posibilities including persistent irritating factors.  Side-effects: If present, they may typically last 2 weeks (the duration of the steroids).  Frequent: Cramps (if they occur, drink Gatorade and take over-the-counter Magnesium 450-500 mg once to twice a day); water retention with temporary weight gain; increases in blood sugar; decreased immune system response; increased appetite.  Occasional: Facial flushing (red, warm cheeks); mood swings; menstrual changes.  Uncommon: Long-term decrease or suppression of natural hormones; bone thinning. (These are more common with higher doses or more frequent use. This is why we prefer that our patients avoid having any injection therapies in other practices.)   Very Rare: Severe mood changes; psychosis; aseptic necrosis.  From procedure: Some discomfort is to be expected once the numbing medicine wears off. This should be minimal if ice and heat are applied as instructed.  Call if: (When should I call?)  You experience numbness and weakness that gets worse with time, as opposed to wearing off.  New onset bowel or bladder incontinence. (Applies only to procedures done in the spine)  Emergency Numbers:  Durning business hours (Monday - Thursday, 8:00 AM - 4:00 PM) (Friday, 9:00 AM - 12:00 Noon): [phone]  After hours: [phone]  NOTE: If you are having a problem and are unable connect with, or to talk to a provider, then go to your nearest urgent care or emergency department. If the problem is serious and urgent, please call 911. ____________________________________________________________________________________________

## 2020-05-02 ENCOUNTER — Ambulatory Visit (HOSPITAL_BASED_OUTPATIENT_CLINIC_OR_DEPARTMENT_OTHER): Payer: BC Managed Care – PPO | Admitting: Pain Medicine

## 2020-05-02 ENCOUNTER — Ambulatory Visit
Admission: RE | Admit: 2020-05-02 | Discharge: 2020-05-02 | Disposition: A | Discharge status: Home / Self Care | Payer: BC Managed Care – PPO | Source: Ambulatory Visit | Attending: Pain Medicine | Admitting: Pain Medicine

## 2020-05-02 ENCOUNTER — Encounter: Payer: Self-pay | Admitting: Pain Medicine

## 2020-05-02 ENCOUNTER — Other Ambulatory Visit: Payer: Self-pay

## 2020-05-02 VITALS — BP 97/54 | HR 98 | Temp 97.6°F | Resp 9 | Ht 70.0 in | Wt 181.0 lb

## 2020-05-02 DIAGNOSIS — G894 Chronic pain syndrome: Secondary | ICD-10-CM | POA: Insufficient documentation

## 2020-05-02 DIAGNOSIS — M79605 Pain in left leg: Secondary | ICD-10-CM | POA: Diagnosis present

## 2020-05-02 DIAGNOSIS — M5442 Lumbago with sciatica, left side: Secondary | ICD-10-CM | POA: Insufficient documentation

## 2020-05-02 DIAGNOSIS — G96198 Other disorders of meninges, not elsewhere classified: Secondary | ICD-10-CM | POA: Insufficient documentation

## 2020-05-02 DIAGNOSIS — M5137 Other intervertebral disc degeneration, lumbosacral region: Secondary | ICD-10-CM

## 2020-05-02 DIAGNOSIS — M961 Postlaminectomy syndrome, not elsewhere classified: Secondary | ICD-10-CM | POA: Insufficient documentation

## 2020-05-02 DIAGNOSIS — M5441 Lumbago with sciatica, right side: Secondary | ICD-10-CM | POA: Insufficient documentation

## 2020-05-02 DIAGNOSIS — M79604 Pain in right leg: Secondary | ICD-10-CM | POA: Diagnosis present

## 2020-05-02 DIAGNOSIS — G8929 Other chronic pain: Secondary | ICD-10-CM

## 2020-05-02 MED ORDER — OXYCODONE HCL 10 MG PO TABS: 10.0000 mg | ORAL_TABLET | Freq: Four times a day (QID) | ORAL | 0 refills | Status: DC | PRN

## 2020-05-02 MED ORDER — LACTATED RINGERS IV SOLN
1000.0000 mL | Freq: Once | INTRAVENOUS | Status: AC
  Administered 2020-05-02: 1000 mL via INTRAVENOUS

## 2020-05-02 MED ORDER — GENTAMICIN SULFATE 40 MG/ML IJ SOLN
120.0000 mg | Freq: Once | INTRAVENOUS | Status: AC
  Administered 2020-05-02: 120 mg via INTRAVENOUS
  Filled 2020-05-02: qty 3

## 2020-05-02 MED ORDER — HYALURONIDASE HUMAN 150 UNIT/ML IJ SOLN
1500.0000 [IU] | Freq: Once | INTRAMUSCULAR | Status: AC
  Administered 2020-05-02: 1500 [IU] via INTRADERMAL
  Filled 2020-05-02: qty 10

## 2020-05-02 MED ORDER — SODIUM CHLORIDE 0.9% FLUSH
4.0000 mL | Freq: Once | INTRAVENOUS | Status: AC
  Administered 2020-05-02: 4 mL

## 2020-05-02 MED ORDER — LIDOCAINE-EPINEPHRINE (PF) 2 %-1:200000 IJ SOLN
10.0000 mL | Freq: Once | INTRAMUSCULAR | Status: AC
  Administered 2020-05-02: 10 mL
  Filled 2020-05-02: qty 20

## 2020-05-02 MED ORDER — LIDOCAINE HCL 2 % IJ SOLN
20.0000 mL | Freq: Once | INTRAMUSCULAR | Status: AC
  Administered 2020-05-02: 400 mg
  Filled 2020-05-02: qty 40

## 2020-05-02 MED ORDER — TRIAMCINOLONE ACETONIDE 40 MG/ML IJ SUSP
40.0000 mg | Freq: Once | INTRAMUSCULAR | Status: AC
  Administered 2020-05-02: 40 mg
  Filled 2020-05-02: qty 1

## 2020-05-02 MED ORDER — STERILE WATER FOR INJECTION IJ SOLN
10.0000 mL | Freq: Once | INTRAVENOUS | Status: AC
  Administered 2020-05-02: 10 mL via EPIDURAL
  Filled 2020-05-02: qty 4.27

## 2020-05-02 MED ORDER — FENTANYL CITRATE (PF) 100 MCG/2ML IJ SOLN
25.0000 ug | INTRAMUSCULAR | Status: DC | PRN
  Administered 2020-05-02: 100 ug via INTRAVENOUS
  Filled 2020-05-02: qty 2

## 2020-05-02 MED ORDER — IOHEXOL 180 MG/ML  SOLN
10.0000 mL | Freq: Once | INTRAMUSCULAR | Status: AC
  Administered 2020-05-02: 10 mL via EPIDURAL

## 2020-05-02 MED ORDER — MIDAZOLAM HCL 5 MG/5ML IJ SOLN
1.0000 mg | INTRAMUSCULAR | Status: DC | PRN
  Filled 2020-05-02: qty 5

## 2020-05-02 MED ORDER — ROPIVACAINE HCL 2 MG/ML IJ SOLN
4.0000 mL | Freq: Once | INTRAMUSCULAR | Status: AC
  Administered 2020-05-02: 4 mL via EPIDURAL
  Filled 2020-05-02: qty 10

## 2020-05-02 NOTE — Progress Notes (Signed)
Safety precautions to be maintained throughout the outpatient stay will include: orient to surroundings, keep bed in low position, maintain call bell within reach at all times, provide assistance with transfer out of bed and ambulation.  

## 2020-05-03 ENCOUNTER — Telehealth: Payer: Self-pay | Admitting: *Deleted

## 2020-05-03 NOTE — Telephone Encounter (Signed)
Spoke with patient re; RACZ procedure on yesterday.  No questions or concerns other than being a little sore.  Encouraged ice/heat for comfort.

## 2020-05-15 ENCOUNTER — Encounter: Payer: Self-pay | Admitting: Pain Medicine

## 2020-05-15 NOTE — Progress Notes (Signed)
Patient: Derek [name]  Service Category: E/M  Provider: Gaspar Cola, MD  DOB: 10-21-1980  DOS: 05/16/2020  Location: Office  MRN: [id number]  Setting: Ambulatory outpatient  Referring Provider: Jodi Marble, MD  Type: Established Patient  Specialty: Interventional Pain Management  PCP: Jodi Marble, MD  Location: Remote location  Delivery: TeleHealth     Virtual Encounter - Pain Management PROVIDER NOTE: Information contained herein reflects review and annotations entered in association with encounter. Interpretation of such information and data should be left to medically-trained personnel. Information provided to patient can be located elsewhere in the medical record under "Patient Instructions". Document created using STT-dictation technology, any transcriptional errors that may result from process are unintentional.    Contact & Pharmacy Preferred: [phone] Home: [phone] (home) Mobile: [phone] (mobile) E-mail: [email or web address]  [place] [place] [address] [place] [address] Phone: [phone] Fax: [phone]   Pre-screening  Derek [name] offered "in-person" vs "virtual" encounter. He indicated preferring virtual for this encounter.   Reason COVID-19*  Social distancing based on CDC and AMA recommendations.   I contacted Derek [name] on 05/16/2020 via telephone.      I clearly identified myself as Gaspar Cola, MD. I verified that I was speaking with the correct person using two identifiers (Name: Derek [name], and date of birth: [birth year]).  Consent I sought verbal advanced consent from Derek [name] for virtual visit interactions. I informed Derek [name] of possible security and privacy concerns, risks, and limitations associated with providing "not-in-person" medical evaluation and management services. I also informed Derek [name] of the availability of "in-person" appointments. Finally,  I informed him that there would be a charge for the virtual visit and that he could be  personally, fully or partially, financially responsible for it. Derek [name] expressed understanding and agreed to proceed.   Historic Elements   Derek [name] is a 39 y.o. year old, male patient evaluated today after our last contact on 05/02/2020. Derek [name]  has a past medical history of Anxiety, Arthritis, and GERD (gastroesophageal reflux disease). He also  has a past surgical history that includes Back surgery; HAND REIMPLANTED; and Lumbar fusion (11/14). Derek [name] has a current medication list which includes the following prescription(s): clindamycin, cyclobenzaprine, diclofenac sodium, magnesium oxide, meloxicam, [START ON 05/26/2020] oxycodone hcl, [START ON 06/25/2020] oxycodone hcl, [START ON 07/25/2020] oxycodone hcl, tizanidine, and gnp calcium 1200. He  reports that he has been smoking. He has a 16.00 pack-year smoking history. He has never used smokeless tobacco. He reports current alcohol use. He reports that he does not use drugs. Derek [name] is allergic to amoxicillin and vicodin [hydrocodone-acetaminophen].   HPI  Today, he is being contacted for a post-procedure assessment.  The patient indicates having done extremely well after the Racz procedure.  Currently he is enjoying an ongoing 80% relief of the pain on his lower back and legs.  He says that occasionally he has experienced some pain down both legs, but it is usually intermittent and it occurs one leg at a time.  He thinks that it may be secondary to the fact that he has had a lot more work than usual and that could be the cause of it.  For the time being he is still doing much better than before the procedure and he refers that the burning sensation is completely gone.  We will continue to monitor his  pain and hopefully will get long-term benefit out of this again.  Post-Procedure Evaluation  Procedure (05/02/2020): Therapeutic left-sided Racz  procedure #5 under fluoroscopic guidance and IV sedation Pre-procedure pain level: 7/10 Post-procedure: 1/10 (> 50% relief)  Sedation: Sedation provided.  Effectiveness during initial hour after procedure(Ultra-Short Term Relief): 100 %.  Local anesthetic used: Long-acting (4-6 hours) Effectiveness: Defined as any analgesic benefit obtained secondary to the administration of local anesthetics. This carries significant diagnostic value as to the etiological location, or anatomical origin, of the pain. Duration of benefit is expected to coincide with the duration of the local anesthetic used.  Effectiveness during initial 4-6 hours after procedure(Short-Term Relief): 100 %.  Long-term benefit: Defined as any relief past the pharmacologic duration of the local anesthetics.  Effectiveness past the initial 6 hours after procedure(Long-Term Relief): 80 %.  Current benefits: Defined as benefit that persist at this time.   Analgesia:  >75% relief Function: Derek [name] reports improvement in function ROM: Derek [name] reports improvement in ROM  Pharmacotherapy Assessment  Analgesic: OxycodoneIR10 mg, 1 tab PO q 6 hrs (40 mg/day of oxycodone) MME/day:31m/day.   Monitoring: [place] PMP: PDMP reviewed during this encounter.       Pharmacotherapy: No side-effects or adverse reactions reported. Compliance: No problems identified. Effectiveness: Clinically acceptable. Plan: Refer to "POC".  UDS:  Summary  Date Value Ref Range Status  02/12/2020 Note  Final    Comment:    ==================================================================== [employer] (MW) ==================================================================== Test                             Result       Flag       Units  Drug Present and Declared for Prescription Verification   Oxycodone                      2323         EXPECTED   ng/mg creat   Oxymorphone                    7355         EXPECTED   ng/mg creat    Noroxycodone                   2746         EXPECTED   ng/mg creat   Noroxymorphone                 805          EXPECTED   ng/mg creat    Sources of oxycodone are scheduled prescription medications.    Oxymorphone, noroxycodone, and noroxymorphone are expected    metabolites of oxycodone. Oxymorphone is also available as a    scheduled prescription medication.  ==================================================================== Test                      Result    Flag   Units      Ref Range   Creatinine              65               mg/dL      >=20 ==================================================================== Declared Medications:  The flagging and interpretation on this report are based on the  following declared medications.  Unexpected results may arise from  inaccuracies in the declared medications.   **Note: The testing  scope of this panel includes these medications:   Oxycodone   **Note: The testing scope of this panel does not include the  following reported medications:   Calcium  Cyclobenzaprine (Flexeril)  Diclofenac  Magnesium (Mag-Ox)  Meloxicam (Mobic)  Tizanidine (Zanaflex) ==================================================================== For clinical consultation, please call [phone]. ====================================================================     Laboratory Chemistry Profile   Renal Lab Results  Component Value Date   BUN 11 01/16/2019   CREATININE 1.08 01/16/2019   BCR 10 01/16/2019   GFRAA 99 01/16/2019   GFRNONAA 86 01/16/2019     Hepatic Lab Results  Component Value Date   AST 24 01/16/2019   ALBUMIN 4.6 01/16/2019   ALKPHOS 108 01/16/2019     Electrolytes Lab Results  Component Value Date   NA 143 01/16/2019   K 4.0 01/16/2019   CL 105 01/16/2019   CALCIUM 10.2 01/16/2019   MG 2.0 01/16/2019     Bone Lab Results  Component Value Date   25OHVITD1 39 01/16/2019   25OHVITD2 1.2 01/16/2019   25OHVITD3 38  01/16/2019     Inflammation (CRP: Acute Phase) (ESR: Chronic Phase) Lab Results  Component Value Date   CRP 4 01/16/2019   ESRSEDRATE 29 (H) 01/16/2019       Note: Above Lab results reviewed.  Imaging  DG PAIN CLINIC C-ARM 1-60 MIN NO REPORT Fluoro was used, but no Radiologist interpretation will be provided.  Please refer to "NOTES" tab for provider progress note.  Assessment  The primary encounter diagnosis was Chronic pain syndrome. 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  Plan of Care  Problem-specific:  No problem-specific Assessment & Plan notes found for this encounter.  Derek [name] has a current medication list which includes the following long-term medication(s): cyclobenzaprine, meloxicam, [START ON 05/26/2020] oxycodone hcl, [START ON 06/25/2020] oxycodone hcl, [START ON 07/25/2020] oxycodone hcl, tizanidine, and gnp calcium 1200.  Pharmacotherapy (Medications Ordered): No orders of the defined types were placed in this encounter.  Orders:  No orders of the defined types were placed in this encounter.  Follow-up plan:   Return for scheduled encounter.      Interventional Pending:      Under consideration:   Diagnostic bilateral lumbar facet block #1 Possible bilateral lumbar facet RFA Diagnostic bilateral sacroiliac joint block #1 Possible bilateral sacroiliac joint RFA Possible spinal cord stimulator trial   Palliative care options:   Palliative right lateral epicondyle (elbow) injection #2  Palliative left Racz procedure #5(targeting the left S2, S3 area) (last done 05/02/2020)    Recent Visits Date Type Provider Dept  05/02/20 Procedure visit Milinda Pointer, MD [facility]  04/09/20 Telemedicine Milinda Pointer, MD [facility]  03/27/20 Telemedicine Molli Barrows, MD [facility]  03/21/20 Procedure visit Milinda Pointer, MD [facility]  03/14/20 Telemedicine Milinda Pointer, MD [facility]  02/29/20 Procedure visit Milinda Pointer, MD [facility]  Showing recent visits within past 90 days and meeting all other requirements Today's Visits Date Type Provider Dept  05/16/20 Telemedicine Milinda Pointer, MD [facility]  Showing today's visits and meeting all other requirements Future Appointments No visits were found meeting these conditions. Showing future appointments within next 90 days and meeting all other requirements  I discussed the assessment and treatment plan with  the patient. The patient was provided an opportunity to ask questions and all were answered. The patient agreed with the plan and demonstrated an understanding of the instructions.  Patient advised to call back or seek an in-person evaluation if the symptoms or condition worsens.  Duration of encounter: 15 minutes.  Note by: Gaspar Cola, MD Date: 05/16/2020; Time: 4:09 PM

## 2020-05-16 ENCOUNTER — Ambulatory Visit: Payer: BC Managed Care – PPO | Attending: Pain Medicine | Admitting: Pain Medicine

## 2020-05-16 ENCOUNTER — Other Ambulatory Visit: Payer: Self-pay

## 2020-05-16 DIAGNOSIS — M961 Postlaminectomy syndrome, not elsewhere classified: Secondary | ICD-10-CM | POA: Diagnosis not present

## 2020-05-16 DIAGNOSIS — M79605 Pain in left leg: Secondary | ICD-10-CM

## 2020-05-16 DIAGNOSIS — M5442 Lumbago with sciatica, left side: Secondary | ICD-10-CM

## 2020-05-16 DIAGNOSIS — M5441 Lumbago with sciatica, right side: Secondary | ICD-10-CM

## 2020-05-16 DIAGNOSIS — M79604 Pain in right leg: Secondary | ICD-10-CM | POA: Diagnosis not present

## 2020-05-16 DIAGNOSIS — G894 Chronic pain syndrome: Secondary | ICD-10-CM | POA: Diagnosis not present

## 2020-05-16 DIAGNOSIS — G8929 Other chronic pain: Secondary | ICD-10-CM

## 2020-05-27 ENCOUNTER — Encounter: Payer: BC Managed Care – PPO | Admitting: Pain Medicine

## 2020-08-18 NOTE — Progress Notes (Signed)
PROVIDER NOTE: Information contained herein reflects review and annotations entered in association with encounter. Interpretation of such information and data should be left to medically-trained personnel. Information provided to patient can be located elsewhere in the medical record under "Patient Instructions". Document created using STT-dictation technology, any transcriptional errors that may result from process are unintentional.    Patient: Derek [name]  Service Category: E/M  Provider: Gaspar Cola, MD  DOB: 14-Mar-1981  DOS: 08/19/2020  Specialty: Interventional Pain Management  MRN: [id number]  Setting: Ambulatory outpatient  PCP: Derek Marble, MD  Type: Established Patient    Referring Provider: Jodi Marble, MD  Location: Office  Delivery: Face-to-face     HPI  Derek [name], a 40 y.o. year old male, is here today because of his Chronic pain syndrome [G89.4]. Derek [name] primary complain today is Leg Pain (left) and Back Pain (low) Last encounter: My last encounter with him was on 05/02/2020. 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); Somatic dysfunction of sacroiliac joints (Bilateral); Chronic hip pain (Bilateral); Epidural fibrosis; Lumbar postlaminectomy syndrome; DDD (degenerative disc disease), lumbosacral; Lumbar facet syndrome (Bilateral) (L>R); Other specified dorsopathies, sacral and sacrococcygeal region; Spondylosis without myelopathy or radiculopathy, lumbosacral region; Neurogenic pain; Osteoarthritic spondylosis of lumbar spine; Tendonitis of elbow, right; Lateral  epicondylitis of elbow (Right); Chronic elbow pain (Right); Chronic low back pain (Left) w/o sciatica; Numbness of anterior thigh (Right); Burning pain in thigh (Right); Chronic low back pain (Midline) w/o sciatica; and Acute exacerbation of chronic low back pain on their pertinent problem list. Pain Assessment: Severity of Chronic pain is reported as a 6 /10. Location: Leg Left/"Feels like it wraps around my left hip." Has some electric shock and some burning , tingling and numbness at times. Itching as well on the right leg.. Onset: More than a month ago. Quality: Sharp,Shooting,Constant (pulling sensation). Timing: Constant. Modifying factor(s): medication, heat , ice, stretching, rest.. Vitals:  height is '5\' 9"'  (1.753 m) and weight is 174 lb (78.9 kg). His temporal temperature is 97.1 F (36.2 C) (abnormal). His blood pressure is 129/86 and his pulse is 97. His respiration is 16 and oxygen saturation is 100%.   Reason for encounter: medication management.   The patient indicates doing well with the current medication regimen. No adverse reactions or side effects reported to the medications.  However, the patient indicates having an increase numbness and burning sensation over his right thigh area.  He is also experiencing an increase in the low back pain but this time is it is higher than usual and midline.  He does have a fusion and therefore there is a possibility that the adjacent segments may be suffering.  Today we will order an x-ray of the upper lumbar region to check for pathology.  I will also schedule him for a possible right-sided L2-3 LESI under fluoroscopic guidance and IV sedation.  RTCB: 11/22/2020 Nonopioids transferred 05/02/2020: Zanaflex, Flexeril, and Mobic  Pharmacotherapy Assessment   Analgesic: OxycodoneIR10 mg, 1 tab PO q 6 hrs (40 mg/day of oxycodone) MME/day:15m/day.   Monitoring: Derek [name] PMP: PDMP reviewed during this encounter.       Pharmacotherapy: No side-effects or  adverse reactions reported.  Compliance: No problems identified. Effectiveness: Clinically acceptable.  Derek Rochester, RN  08/19/2020  8:26 AM  Sign when Signing Visit Nursing Pain Medication Assessment:  Safety precautions to be maintained throughout the outpatient stay will include: orient to surroundings, keep bed in low position, maintain call bell within reach at all times, provide assistance with transfer out of bed and ambulation.  Medication Inspection Compliance: Pill count conducted under aseptic conditions, in front of the patient. Neither the pills nor the bottle was removed from the patient's sight at any time. Once count was completed pills were immediately returned to the patient in their original bottle.  Medication: Oxycodone IR Pill/Patch Count: 23 of 120 pills remain Pill/Patch Appearance: Markings consistent with prescribed medication Bottle Appearance: Standard pharmacy container. Clearly labeled. Filled Date: 01 / 20 / 2022 Last Medication intake:  Today    UDS:  Summary  Date Value Ref Range Status  02/12/2020 Note  Final    Comment:    ==================================================================== [employer] (MW) ==================================================================== Test                             Result       Flag       Units  Drug Present and Declared for Prescription Verification   Oxycodone                      2323         EXPECTED   ng/mg creat   Oxymorphone                    7355         EXPECTED   ng/mg creat   Noroxycodone                   2746         EXPECTED   ng/mg creat   Noroxymorphone                 805          EXPECTED   ng/mg creat    Sources of oxycodone are scheduled prescription medications.    Oxymorphone, noroxycodone, and noroxymorphone are expected    metabolites of oxycodone. Oxymorphone is also available as a    scheduled prescription  medication.  ==================================================================== Test                      Result    Flag   Units      Ref Range   Creatinine              65               mg/dL      >=20 ==================================================================== Declared Medications:  The flagging and interpretation on this report are based on the  following declared medications.  Unexpected results may arise from  inaccuracies in the declared medications.   **Note: The testing scope of this panel includes these medications:   Oxycodone   **Note: The testing scope of this panel does not include the  following reported medications:   Calcium  Cyclobenzaprine (Flexeril)  Diclofenac  Magnesium (Mag-Ox)  Meloxicam (Mobic)  Tizanidine (Zanaflex) ==================================================================== For clinical consultation, please call [phone]. ====================================================================      ROS  Constitutional: Denies any fever or chills Gastrointestinal: No reported hemesis, hematochezia, vomiting, or acute GI distress Musculoskeletal: Denies any  acute onset joint swelling, redness, loss of ROM, or weakness Neurological: No reported episodes of acute onset apraxia, aphasia, dysarthria, agnosia, amnesia, paralysis, loss of coordination, or loss of consciousness  Medication Review  Diclofenac Sodium, GNP Calcium 1200, Magnesium Oxide, Oxycodone HCl, cyclobenzaprine, meloxicam, and tiZANidine  History Review  Allergy: Mr. Sem is allergic to amoxicillin and vicodin [hydrocodone-acetaminophen]. Drug: Mr. Proudfoot  reports no history of drug use. Alcohol:  reports current alcohol use. Tobacco:  reports that he has been smoking. He has a 16.00 pack-year smoking history. He has never used smokeless tobacco. Social: Mr. Febles  reports that he has been smoking. He has a 16.00 pack-year smoking history. He has never used  smokeless tobacco. He reports current alcohol use. He reports that he does not use drugs. Medical:  has a past medical history of Anxiety, Arthritis, and GERD (gastroesophageal reflux disease). Surgical: Mr. Asmar  has a past surgical history that includes Back surgery; HAND REIMPLANTED; and Lumbar fusion (11/14). Family: family history is not on file.  Laboratory Chemistry Profile   Renal Lab Results  Component Value Date   BUN 11 01/16/2019   CREATININE 1.08 01/16/2019   BCR 10 01/16/2019   GFRAA 99 01/16/2019   GFRNONAA 86 01/16/2019     Hepatic Lab Results  Component Value Date   AST 24 01/16/2019   ALBUMIN 4.6 01/16/2019   ALKPHOS 108 01/16/2019     Electrolytes Lab Results  Component Value Date   NA 143 01/16/2019   K 4.0 01/16/2019   CL 105 01/16/2019   CALCIUM 10.2 01/16/2019   MG 2.0 01/16/2019     Bone Lab Results  Component Value Date   25OHVITD1 39 01/16/2019   25OHVITD2 1.2 01/16/2019   25OHVITD3 38 01/16/2019     Inflammation (CRP: Acute Phase) (ESR: Chronic Phase) Lab Results  Component Value Date   CRP 4 01/16/2019   ESRSEDRATE 29 (H) 01/16/2019       Note: Above Lab results reviewed.  Recent Imaging Review  DG PAIN [facility] C-ARM 1-60 MIN NO REPORT Fluoro was used, but no Radiologist interpretation will be provided.  Please refer to "NOTES" tab for provider progress note. Note: Reviewed        Physical Exam  General appearance: Well nourished, well developed, and well hydrated. In no apparent acute distress Mental status: Alert, oriented x 3 (person, place, & time)       Respiratory: No evidence of acute respiratory distress Eyes: PERLA Vitals: BP 129/86 (BP Location: Left Arm, Patient Position: Sitting, Cuff Size: Large)    Pulse 97    Temp (!) 97.1 F (36.2 C) (Temporal)    Resp 16    Ht '5\' 9"'  (1.753 m)    Wt 174 lb (78.9 kg)    SpO2 100%    BMI 25.70 kg/m  BMI: Estimated body mass index is 25.7 kg/m as calculated from the following:    Height as of this encounter: '5\' 9"'  (1.753 m).   Weight as of this encounter: 174 lb (78.9 kg). Ideal: Ideal body weight: 70.7 kg (155 lb 13.8 oz) Adjusted ideal body weight: 74 kg (163 lb 1.9 oz)  Assessment   Status Diagnosis  Controlled Controlled Controlled 1. Chronic pain syndrome   2. Burning pain in thigh (Right)   3. Numbness of anterior thigh (Right)   4. Acute exacerbation of chronic low back pain   5. Chronic low back pain (Midline) w/o sciatica   6. Chronic low back pain (1ry  area of Pain) (Bilateral) (L>R) w/ sciatica (Bilateral)   7. Chronic lower extremity pain (2ry area of Pain) (Bilateral) (L>R)   8. Failed back surgical syndrome (x3)   9. Epidural fibrosis   10. DDD (degenerative disc disease), lumbosacral   11. Pharmacologic therapy      Updated Problems: Problem  Numbness of anterior thigh (Right)  Burning pain in thigh (Right)  Chronic low back pain (Midline) w/o sciatica  Acute Exacerbation of Chronic Low Back Pain    Plan of Care  Problem-specific:  No problem-specific Assessment & Plan notes found for this encounter.  Mr. DIMETRI ARMITAGE has a current medication list which includes the following long-term medication(s): gnp calcium 1200, cyclobenzaprine, meloxicam, [START ON 08/24/2020] oxycodone hcl, [START ON 09/23/2020] oxycodone hcl, [START ON 10/23/2020] oxycodone hcl, and tizanidine.  Pharmacotherapy (Medications Ordered): Meds ordered this encounter  Medications   Oxycodone HCl 10 MG TABS    Sig: Take 1 tablet (10 mg total) by mouth every 6 (six) hours as needed. Must last 30 days    Dispense:  120 tablet    Refill:  0    Chronic Pain: STOP Act (Not applicable) Fill 1 day early if closed on refill date. Avoid benzodiazepines within 8 hours of opioids   Oxycodone HCl 10 MG TABS    Sig: Take 1 tablet (10 mg total) by mouth every 6 (six) hours as needed. Must last 30 days    Dispense:  120 tablet    Refill:  0    Chronic Pain: STOP Act (Not  applicable) Fill 1 day early if closed on refill date. Avoid benzodiazepines within 8 hours of opioids   Oxycodone HCl 10 MG TABS    Sig: Take 1 tablet (10 mg total) by mouth every 6 (six) hours as needed. Must last 30 days    Dispense:  120 tablet    Refill:  0    Chronic Pain: STOP Act (Not applicable) Fill 1 day early if closed on refill date. Avoid benzodiazepines within 8 hours of opioids   Orders:  Orders Placed This Encounter  Procedures   Lumbar Epidural Injection    Standing Status:   Future    Standing Expiration Date:   09/16/2020    Scheduling Instructions:     Procedure: Interlaminar Lumbar Epidural Steroid injection (LESI)  L2-3     Laterality: Right-sided     Sedation: Patient's choice.     Timeframe: ASAA    Order Specific Question:   Where will this procedure be performed?    Answer:   [facility]   DG Lumbar Spine Complete W/Bend    Patient presents with axial pain with possible radicular component.  In addition to any acute findings, please report on:  1. Facet (Zygapophyseal) joint DJD (Hypertrophy, space narrowing, subchondral sclerosis, and/or osteophyte formation) 2. DDD and/or IVDD (Loss of disc height, desiccation or "Black disc disease") 3. Pars defects 4. Spondylolisthesis, spondylosis, and/or spondyloarthropathies (include Degree/Grade of displacement in mm) 5. Vertebral body Fractures, including age (old, new/acute) 4. Modic Type Changes 7. Demineralization 8. Bone pathology 9. Central, Lateral Recess, and/or Foraminal Stenosis (include AP diameter of stenosis in mm) 10. Surgical changes (hardware type, status, and presence of fibrosis)  NOTE: Please specify level(s) and laterality. If applicable: Please indicate ROM and/or evidence of instability (>4m displacement between flexion and extension views)    Standing Status:   Future    Standing Expiration Date:   09/16/2020    Scheduling Instructions:  Imaging must be done as soon as  possible. Inform patient that order will expire within 30 days and I will not renew it.    Order Specific Question:   Reason for Exam (SYMPTOM  OR DIAGNOSIS REQUIRED)    Answer:   Low back pain    Order Specific Question:   Preferred imaging location?    Answer:   [facility]    Order Specific Question:   Call Results- Best Contact Number?    Answer:   [phone] ([facility])    Order Specific Question:   Radiology Contrast Protocol - do NOT remove file path    Answer:   [email or web address]    Order Specific Question:   Release to patient    Answer:   Immediate   Follow-up plan:   Return in about 3 months (around 11/22/2020) for (F2F), (Med Mgmt), in addition, Procedure (w/ sedation): (R) L2-3 LESI #1 (ASAP).      Interventional Pending:      Under consideration:   Diagnostic bilateral lumbar facet block #1 Possible bilateral lumbar facet RFA Diagnostic bilateral sacroiliac joint block #1 Possible bilateral sacroiliac joint RFA Possible spinal cord stimulator trial   Palliative care options:   Palliative right lateral epicondyle (elbow) injection #2  Palliative left Racz procedure #5(targeting the left S2, S3 area) (last done 05/02/2020)     Recent Visits No visits were found meeting these conditions. Showing recent visits within past 90 days and meeting all other requirements Today's Visits Date Type Provider Dept  08/19/20 Office Visit Milinda Pointer, MD [facility]  Showing today's visits and meeting all other requirements Future Appointments No visits were found meeting these conditions. Showing future appointments within next 90 days and meeting all other requirements  I discussed the assessment and treatment plan with the patient. The patient was provided an opportunity to ask questions and all were answered. The patient agreed with the plan and demonstrated an understanding of the  instructions.  Patient advised to call back or seek an in-person evaluation if the symptoms or condition worsens.  Duration of encounter: 30 minutes.  Note by: Derek Cola, MD Date: 08/19/2020; Time: 9:03 AM

## 2020-08-19 ENCOUNTER — Ambulatory Visit: Payer: BC Managed Care – PPO | Attending: Pain Medicine | Admitting: Pain Medicine

## 2020-08-19 ENCOUNTER — Encounter: Payer: Self-pay | Admitting: Pain Medicine

## 2020-08-19 ENCOUNTER — Other Ambulatory Visit: Payer: Self-pay

## 2020-08-19 VITALS — BP 129/86 | HR 97 | Temp 97.1°F | Resp 16 | Ht 69.0 in | Wt 174.0 lb

## 2020-08-19 DIAGNOSIS — M5441 Lumbago with sciatica, right side: Secondary | ICD-10-CM | POA: Diagnosis present

## 2020-08-19 DIAGNOSIS — M545 Low back pain, unspecified: Secondary | ICD-10-CM | POA: Insufficient documentation

## 2020-08-19 DIAGNOSIS — M5137 Other intervertebral disc degeneration, lumbosacral region: Secondary | ICD-10-CM | POA: Diagnosis present

## 2020-08-19 DIAGNOSIS — M79605 Pain in left leg: Secondary | ICD-10-CM | POA: Insufficient documentation

## 2020-08-19 DIAGNOSIS — G894 Chronic pain syndrome: Secondary | ICD-10-CM | POA: Diagnosis not present

## 2020-08-19 DIAGNOSIS — R52 Pain, unspecified: Secondary | ICD-10-CM | POA: Insufficient documentation

## 2020-08-19 DIAGNOSIS — M961 Postlaminectomy syndrome, not elsewhere classified: Secondary | ICD-10-CM | POA: Diagnosis present

## 2020-08-19 DIAGNOSIS — G96198 Other disorders of meninges, not elsewhere classified: Secondary | ICD-10-CM | POA: Diagnosis present

## 2020-08-19 DIAGNOSIS — R200 Anesthesia of skin: Secondary | ICD-10-CM | POA: Insufficient documentation

## 2020-08-19 DIAGNOSIS — M79604 Pain in right leg: Secondary | ICD-10-CM | POA: Diagnosis present

## 2020-08-19 DIAGNOSIS — G8929 Other chronic pain: Secondary | ICD-10-CM | POA: Insufficient documentation

## 2020-08-19 DIAGNOSIS — M5442 Lumbago with sciatica, left side: Secondary | ICD-10-CM | POA: Insufficient documentation

## 2020-08-19 DIAGNOSIS — Z79899 Other long term (current) drug therapy: Secondary | ICD-10-CM | POA: Diagnosis present

## 2020-08-19 MED ORDER — OXYCODONE HCL 10 MG PO TABS: 10.0000 mg | ORAL_TABLET | Freq: Four times a day (QID) | ORAL | 0 refills | Status: DC | PRN

## 2020-08-19 NOTE — Progress Notes (Signed)
Nursing Pain Medication Assessment:  Safety precautions to be maintained throughout the outpatient stay will include: orient to surroundings, keep bed in low position, maintain call bell within reach at all times, provide assistance with transfer out of bed and ambulation.  Medication Inspection Compliance: Pill count conducted under aseptic conditions, in front of the patient. Neither the pills nor the bottle was removed from the patient's sight at any time. Once count was completed pills were immediately returned to the patient in their original bottle.  Medication: Oxycodone IR Pill/Patch Count: 23 of 120 pills remain Pill/Patch Appearance: Markings consistent with prescribed medication Bottle Appearance: Standard pharmacy container. Clearly labeled. Filled Date: 01 / 20 / 2022 Last Medication intake:  Today

## 2020-08-19 NOTE — Patient Instructions (Addendum)
____________________________________________________________________________________________  Medication Rules  Purpose: To inform patients, and their family members, of our rules and regulations.  Applies to: All patients receiving prescriptions (written or electronic).  Pharmacy of record: Pharmacy where electronic prescriptions will be sent. If written prescriptions are taken to a different pharmacy, please inform the nursing staff. The pharmacy listed in the electronic medical record should be the one where you would like electronic prescriptions to be sent.  Electronic prescriptions: In compliance with the [place] Strengthen Opioid Misuse Prevention (STOP) Act of 2017 (Session Law 2017-74/H243), effective July 06, 2018, all controlled substances must be electronically prescribed. Calling prescriptions to the pharmacy will cease to exist.  Prescription refills: Only during scheduled appointments. Applies to all prescriptions.  NOTE: The following applies primarily to controlled substances (Opioid* Pain Medications).   Type of encounter (visit): For patients receiving controlled substances, face-to-face visits are required. (Not an option or up to the patient.)  Patient's responsibilities: 1. Pain Pills: Bring all pain pills to every appointment (except for procedure appointments). 2. Pill Bottles: Bring pills in original pharmacy bottle. Always bring the newest bottle. Bring bottle, even if empty. 3. Medication refills: You are responsible for knowing and keeping track of what medications you take and those you need refilled. The day before your appointment: write a list of all prescriptions that need to be refilled. The day of the appointment: give the list to the admitting nurse. Prescriptions will be written only during appointments. No prescriptions will be written on procedure days. If you forget a medication: it will not be "Called in", "Faxed", or "electronically sent".  You will need to get another appointment to get these prescribed. No early refills. Do not call asking to have your prescription filled early. 4. Prescription Accuracy: You are responsible for carefully inspecting your prescriptions before leaving our office. Have the discharge nurse carefully go over each prescription with you, before taking them home. Make sure that your name is accurately spelled, that your address is correct. Check the name and dose of your medication to make sure it is accurate. Check the number of pills, and the written instructions to make sure they are clear and accurate. Make sure that you are given enough medication to last until your next medication refill appointment. 5. Taking Medication: Take medication as prescribed. When it comes to controlled substances, taking less pills or less frequently than prescribed is permitted and encouraged. Never take more pills than instructed. Never take medication more frequently than prescribed.  6. Inform other Doctors: Always inform, all of your healthcare providers, of all the medications you take. 7. Pain Medication from other Providers: You are not allowed to accept any additional pain medication from any other Doctor or Healthcare provider. There are two exceptions to this rule. (see below) In the event that you require additional pain medication, you are responsible for notifying us, as stated below. 8. Cough Medicine: Often these contain an opioid, such as codeine or hydrocodone. Never accept or take cough medicine containing these opioids if you are already taking an opioid* medication. The combination may cause respiratory failure and death. 9. Medication Agreement: You are responsible for carefully reading and following our Medication Agreement. This must be signed before receiving any prescriptions from our practice. Safely store a copy of your signed Agreement. Violations to the Agreement will result in no further prescriptions.  (Additional copies of our Medication Agreement are available upon request.) 10. Laws, Rules, & Regulations: All patients are expected to follow all   Federal and State Laws, Statutes, Rules, & Regulations. Ignorance of the Laws does not constitute a valid excuse.  11. Illegal drugs and Controlled Substances: The use of illegal substances (including, but not limited to marijuana and its derivatives) and/or the illegal use of any controlled substances is strictly prohibited. Violation of this rule may result in the immediate and permanent discontinuation of any and all prescriptions being written by our practice. The use of any illegal substances is prohibited. 12. Adopted CDC guidelines & recommendations: Target dosing levels will be at or below 60 MME/day. Use of benzodiazepines** is not recommended.  Exceptions: There are only two exceptions to the rule of not receiving pain medications from other Healthcare Providers. 1. Exception #1 (Emergencies): In the event of an emergency (i.e.: accident requiring emergency care), you are allowed to receive additional pain medication. However, you are responsible for: As soon as you are able, call our office [phone], at any time of the day or night, and leave a message stating your name, the date and nature of the emergency, and the name and dose of the medication prescribed. In the event that your call is answered by a member of our staff, make sure to document and save the date, time, and the name of the person that took your information.  2. Exception #2 (Planned Surgery): In the event that you are scheduled by another doctor or dentist to have any type of surgery or procedure, you are allowed (for a period no longer than 30 days), to receive additional pain medication, for the acute post-op pain. However, in this case, you are responsible for picking up a copy of our "Post-op Pain Management for Surgeons" handout, and giving it to your surgeon or dentist. This  document is available at our office, and does not require an appointment to obtain it. Simply go to our office during business hours (Monday-Thursday from 8:00 AM to 4:00 PM) (Friday 8:00 AM to 12:00 Noon) or if you have a scheduled appointment with us, prior to your surgery, and ask for it by name. In addition, you are responsible for: calling our office [phone], at any time of the day or night, and leaving a message stating your name, name of your surgeon, type of surgery, and date of procedure or surgery. Failure to comply with your responsibilities may result in termination of therapy involving the controlled substances.  *Opioid medications include: morphine, codeine, oxycodone, oxymorphone, hydrocodone, hydromorphone, meperidine, tramadol, tapentadol, buprenorphine, fentanyl, methadone. **Benzodiazepine medications include: diazepam (Valium), alprazolam (Xanax), clonazepam (Klonopine), lorazepam (Ativan), clorazepate (Tranxene), chlordiazepoxide (Librium), estazolam (Prosom), oxazepam (Serax), temazepam (Restoril), triazolam (Halcion) (Last updated: 06/03/2020) ____________________________________________________________________________________________   ____________________________________________________________________________________________  Medication Recommendations and Reminders  Applies to: All patients receiving prescriptions (written and/or electronic).  Medication Rules & Regulations: These rules and regulations exist for your safety and that of others. They are not flexible and neither are we. Dismissing or ignoring them will be considered "non-compliance" with medication therapy, resulting in complete and irreversible termination of such therapy. (See document titled "Medication Rules" for more details.) In all conscience, because of safety reasons, we cannot continue providing a therapy where the patient does not follow instructions.  Pharmacy of record:   Definition:  This is the pharmacy where your electronic prescriptions will be sent.   We do not endorse any particular pharmacy, however, we have experienced problems with [facility] not securing enough medication supply for the community.  We do not restrict you in your choice of pharmacy. However,   once we write for your prescriptions, we will NOT be re-sending more prescriptions to fix restricted supply problems created by your pharmacy, or your insurance.   The pharmacy listed in the electronic medical record should be the one where you want electronic prescriptions to be sent.  If you choose to change pharmacy, simply notify our nursing staff.  Recommendations:  Keep all of your pain medications in a safe place, under lock and key, even if you live alone. We will NOT replace lost, stolen, or damaged medication.  After you fill your prescription, take 1 week's worth of pills and put them away in a safe place. You should keep a separate, properly labeled bottle for this purpose. The remainder should be kept in the original bottle. Use this as your primary supply, until it runs out. Once it's gone, then you know that you have 1 week's worth of medicine, and it is time to come in for a prescription refill. If you do this correctly, it is unlikely that you will ever run out of medicine.  To make sure that the above recommendation works, it is very important that you make sure your medication refill appointments are scheduled at least 1 week before you run out of medicine. To do this in an effective manner, make sure that you do not leave the office without scheduling your next medication management appointment. Always ask the nursing staff to show you in your prescription , when your medication will be running out. Then arrange for the receptionist to get you a return appointment, at least 7 days before you run out of medicine. Do not wait until you have 1 or 2 pills left, to come in. This is very poor planning and  does not take into consideration that we may need to cancel appointments due to bad weather, sickness, or emergencies affecting our staff.  DO NOT ACCEPT A "Partial Fill": If for any reason your pharmacy does not have enough pills/tablets to completely fill or refill your prescription, do not allow for a "partial fill". The law allows the pharmacy to complete that prescription within 72 hours, without requiring a new prescription. If they do not fill the rest of your prescription within those 72 hours, you will need a separate prescription to fill the remaining amount, which we will NOT provide. If the reason for the partial fill is your insurance, you will need to talk to the pharmacist about payment alternatives for the remaining tablets, but again, DO NOT ACCEPT A PARTIAL FILL, unless you can trust your pharmacist to obtain the remainder of the pills within 72 hours.  Prescription refills and/or changes in medication(s):   Prescription refills, and/or changes in dose or medication, will be conducted only during scheduled medication management appointments. (Applies to both, written and electronic prescriptions.)  No refills on procedure days. No medication will be changed or started on procedure days. No changes, adjustments, and/or refills will be conducted on a procedure day. Doing so will interfere with the diagnostic portion of the procedure.  No phone refills. No medications will be "called into the pharmacy".  No Fax refills.  No weekend refills.  No Holliday refills.  No after hours refills.  Remember:  Business hours are:  Monday to Thursday 8:00 AM to 4:00 PM Provider's Schedule: Christino Mcglinchey, MD - Appointments are:  Medication management: Monday and Wednesday 8:00 AM to 4:00 PM Procedure day: Tuesday and Thursday 7:30 AM to 4:00 PM Bilal Lateef, MD - Appointments are:    Medication management: Tuesday and Thursday 8:00 AM to 4:00 PM Procedure day: Monday and Wednesday  7:30 AM to 4:00 PM (Last update: 01/24/2020) ____________________________________________________________________________________________   ____________________________________________________________________________________________  CBD (cannabidiol) WARNING  Applicable to: All individuals currently taking or considering taking CBD (cannabidiol) and, more important, all patients taking opioid analgesic controlled substances (pain medication). (Example: oxycodone; oxymorphone; hydrocodone; hydromorphone; morphine; methadone; tramadol; tapentadol; fentanyl; buprenorphine; butorphanol; dextromethorphan; meperidine; codeine; etc.)  Legal status: CBD remains a Schedule I drug prohibited for any use. CBD is illegal with one exception. In the [place], CBD has a limited Food and Drug Administration (FDA) approval for the treatment of two specific types of epilepsy disorders. Only one CBD product has been approved by the FDA for this purpose: "Epidiolex". FDA is aware that some companies are marketing products containing cannabis and cannabis-derived compounds in ways that violate the Federal Food, Drug and Cosmetic Act (FD&C Act) and that may put the health and safety of consumers at risk. The FDA, a Federal agency, has not enforced the CBD status since 2018.   Legality: Some manufacturers ship CBD products nationally, which is illegal. Often such products are sold online and are therefore available throughout the country. CBD is openly sold in head shops and health food stores in some states where such sales have not been explicitly legalized. Selling unapproved products with unsubstantiated therapeutic claims is not only a violation of the law, but also can put patients at risk, as these products have not been proven to be safe or effective. Federal illegality makes it difficult to conduct research on CBD.  Reference: "FDA Regulation of Cannabis and Cannabis-Derived Products, Including Cannabidiol  (CBD)" - https://www.fda.gov/news-events/public-health-focus/fda-regulation-cannabis-and-cannabis-derived-products-including-cannabidiol-cbd  Warning: CBD is not FDA approved and has not undergo the same manufacturing controls as prescription drugs.  This means that the purity and safety of available CBD may be questionable. Most of the time, despite manufacturer's claims, it is contaminated with THC (delta-9-tetrahydrocannabinol - the chemical in marijuana responsible for the "HIGH").  When this is the case, the THC contaminant will trigger a positive urine drug screen (UDS) test for Marijuana (carboxy-THC). Because a positive UDS for any illicit substance is a violation of our medication agreement, your opioid analgesics (pain medicine) may be permanently discontinued.  MORE ABOUT CBD  General Information: CBD  is a derivative of the Marijuana (cannabis sativa) plant discovered in 1940. It is one of the 113 identified substances found in Marijuana. It accounts for up to 40% of the plant's extract. As of 2018, preliminary clinical studies on CBD included research for the treatment of anxiety, movement disorders, and pain. CBD is available and consumed in multiple forms, including inhalation of smoke or vapor, as an aerosol spray, and by mouth. It may be supplied as an oil containing CBD, capsules, dried cannabis, or as a liquid solution. CBD is thought not to be as psychoactive as THC (delta-9-tetrahydrocannabinol - the chemical in marijuana responsible for the "HIGH"). Studies suggest that CBD may interact with different biological target receptors in the body, including cannabinoid and other neurotransmitter receptors. As of 2018 the mechanism of action for its biological effects has not been determined.  Side-effects  Adverse reactions: Dry mouth, diarrhea, decreased appetite, fatigue, drowsiness, malaise, weakness, sleep disturbances, and others.  Drug interactions: CBC may interact with other  medications such as blood-thinners. (Last update: 02/10/2020) ____________________________________________________________________________________________   ____________________________________________________________________________________________  Drug Holidays (Slow)  What is a "Drug Holiday"? Drug Holiday: is the name given to the period of time during   which a patient stops taking a medication(s) for the purpose of eliminating tolerance to the drug.  Benefits . Improved effectiveness of opioids. . Decreased opioid dose needed to achieve benefits. . Improved pain with lesser dose.  What is tolerance? Tolerance: is the progressive decreased in effectiveness of a drug due to its repetitive use. With repetitive use, the body gets use to the medication and as a consequence, it loses its effectiveness. This is a common problem seen with opioid pain medications. As a result, a larger dose of the drug is needed to achieve the same effect that used to be obtained with a smaller dose.  How long should a "Drug Holiday" last? You should stay off of the pain medicine for at least 14 consecutive days. (2 weeks)  Should I stop the medicine "cold [place]"? No. You should always coordinate with your Pain Specialist so that he/she can provide you with the correct medication dose to make the transition as smoothly as possible.  How do I stop the medicine? Slowly. You will be instructed to decrease the daily amount of pills that you take by one (1) pill every seven (7) days. This is called a "slow downward taper" of your dose. For example: if you normally take four (4) pills per day, you will be asked to drop this dose to three (3) pills per day for seven (7) days, then to two (2) pills per day for seven (7) days, then to one (1) per day for seven (7) days, and at the end of those last seven (7) days, this is when the "Drug Holiday" would start.   Will I have withdrawals? By doing a "slow downward  taper" like this one, it is unlikely that you will experience any significant withdrawal symptoms. Typically, what triggers withdrawals is the sudden stop of a high dose opioid therapy. Withdrawals can usually be avoided by slowly decreasing the dose over a prolonged period of time. If you do not follow these instructions and decide to stop your medication abruptly, withdrawals may be possible.  What are withdrawals? Withdrawals: refers to the wide range of symptoms that occur after stopping or dramatically reducing opiate drugs after heavy and prolonged use. Withdrawal symptoms do not occur to patients that use low dose opioids, or those who take the medication sporadically. Contrary to benzodiazepine (example: Valium, Xanax, etc.) or alcohol withdrawals ("Delirium Tremens"), opioid withdrawals are not lethal. Withdrawals are the physical manifestation of the body getting rid of the excess receptors.  Expected Symptoms Early symptoms of withdrawal may include: . Agitation . Anxiety . Muscle aches . Increased tearing . Insomnia . Runny nose . Sweating . Yawning  Late symptoms of withdrawal may include: . Abdominal cramping . Diarrhea . Dilated pupils . Goose bumps . Nausea . Vomiting  Will I experience withdrawals? Due to the slow nature of the taper, it is very unlikely that you will experience any.  What is a slow taper? Taper: refers to the gradual decrease in dose.  (Last update: 01/24/2020) ____________________________________________________________________________________________    ____________________________________________________________________________________________  Preparing for Procedure with Sedation  Procedure appointments are limited to planned procedures: . No Prescription Refills. . No disability issues will be discussed. . No medication changes will be discussed.  Instructions: . Oral Intake: Do not eat or drink anything for at least 8 hours prior  to your procedure. (Exception: Blood Pressure Medication. See below.) . Transportation: Unless otherwise stated by your physician, you may drive yourself after the procedure. . Blood Pressure  Medicine: Do not forget to take your blood pressure medicine with a sip of water the morning of the procedure. If your Diastolic (lower reading)is above 100 mmHg, elective cases will be cancelled/rescheduled. . Blood thinners: These will need to be stopped for procedures. Notify our staff if you are taking any blood thinners. Depending on which one you take, there will be specific instructions on how and when to stop it. . Diabetics on insulin: Notify the staff so that you can be scheduled 1st case in the morning. If your diabetes requires high dose insulin, take only  of your normal insulin dose the morning of the procedure and notify the staff that you have done so. . Preventing infections: Shower with an antibacterial soap the morning of your procedure. . Build-up your immune system: Take 1000 mg of Vitamin C with every meal (3 times a day) the day prior to your procedure. Marland Kitchen Antibiotics: Inform the staff if you have a condition or reason that requires you to take antibiotics before dental procedures. . Pregnancy: If you are pregnant, call and cancel the procedure. . Sickness: If you have a cold, fever, or any active infections, call and cancel the procedure. . Arrival: You must be in the facility at least 30 minutes prior to your scheduled procedure. . Children: Do not bring children with you. . Dress appropriately: Bring dark clothing that you would not mind if they get stained. . Valuables: Do not bring any jewelry or valuables.  Reasons to call and reschedule or cancel your procedure: (Following these recommendations will minimize the risk of a serious complication.) . Surgeries: Avoid having procedures within 2 weeks of any surgery. (Avoid for 2 weeks before or after any surgery). . Flu Shots: Avoid  having procedures within 2 weeks of a flu shots or . (Avoid for 2 weeks before or after immunizations). . Barium: Avoid having a procedure within 7-10 days after having had a radiological study involving the use of radiological contrast. (Myelograms, Barium swallow or enema study). . Heart attacks: Avoid any elective procedures or surgeries for the initial 6 months after a "Myocardial Infarction" (Heart Attack). . Blood thinners: It is imperative that you stop these medications before procedures. Let us know if you if you take any blood thinner.  . Infection: Avoid procedures during or within two weeks of an infection (including chest colds or gastrointestinal problems). Symptoms associated with infections include: Localized redness, fever, chills, night sweats or profuse sweating, burning sensation when voiding, cough, congestion, stuffiness, runny nose, sore throat, diarrhea, nausea, vomiting, cold or Flu symptoms, recent or current infections. It is specially important if the infection is over the area that we intend to treat. Marland Kitchen Heart and lung problems: Symptoms that may suggest an active cardiopulmonary problem include: cough, chest pain, breathing difficulties or shortness of breath, dizziness, ankle swelling, uncontrolled high or unusually low blood pressure, and/or palpitations. If you are experiencing any of these symptoms, cancel your procedure and contact your primary care physician for an evaluation.  Remember:  Regular Business hours are:  Monday to Thursday 8:00 AM to 4:00 PM  Provider's Schedule: Milinda Pointer, MD:  Procedure days: Tuesday and Thursday 7:30 AM to 4:00 PM  Gillis Santa, MD:  Procedure days: Monday and Wednesday 7:30 AM to 4:00 PM ____________________________________________________________________________________________   ____________________________________________________________________________________________  General Risks and Possible  Complications  Patient Responsibilities: It is important that you read this as it is part of your informed consent. It is our duty to inform you  of the risks and possible complications associated with treatments offered to you. It is your responsibility as a patient to read this and to ask questions about anything that is not clear or that you believe was not covered in this document.  Patient's Rights: You have the right to refuse treatment. You also have the right to change your mind, even after initially having agreed to have the treatment done. However, under this last option, if you wait until the last second to change your mind, you may be charged for the materials used up to that point.  Introduction: Medicine is not an Chief Strategy Officer. Everything in Medicine, including the lack of treatment(s), carries the potential for danger, harm, or loss (which is by definition: Risk). In Medicine, a complication is a secondary problem, condition, or disease that can aggravate an already existing one. All treatments carry the risk of possible complications. The fact that a side effects or complications occurs, does not imply that the treatment was conducted incorrectly. It must be clearly understood that these can happen even when everything is done following the highest safety standards.  No treatment: You can choose not to proceed with the proposed treatment alternative. The "PRO(s)" would include: avoiding the risk of complications associated with the therapy. The "CON(s)" would include: not getting any of the treatment benefits. These benefits fall under one of three categories: diagnostic; therapeutic; and/or palliative. Diagnostic benefits include: getting information which can ultimately lead to improvement of the disease or symptom(s). Therapeutic benefits are those associated with the successful treatment of the disease. Finally, palliative benefits are those related to the decrease of the primary  symptoms, without necessarily curing the condition (example: decreasing the pain from a flare-up of a chronic condition, such as incurable terminal cancer).  General Risks and Complications: These are associated to most interventional treatments. They can occur alone, or in combination. They fall under one of the following six (6) categories: no benefit or worsening of symptoms; bleeding; infection; nerve damage; allergic reactions; and/or death. 1. No benefits or worsening of symptoms: In Medicine there are no guarantees, only probabilities. No healthcare provider can ever guarantee that a medical treatment will work, they can only state the probability that it may. Furthermore, there is always the possibility that the condition may worsen, either directly, or indirectly, as a consequence of the treatment. 2. Bleeding: This is more common if the patient is taking a blood thinner, either prescription or over the counter (example: Goody Powders, Fish oil, Aspirin, Garlic, etc.), or if suffering a condition associated with impaired coagulation (example: Hemophilia, cirrhosis of the liver, low platelet counts, etc.). However, even if you do not have one on these, it can still happen. If you have any of these conditions, or take one of these drugs, make sure to notify your treating physician. 3. Infection: This is more common in patients with a compromised immune system, either due to disease (example: diabetes, cancer, human immunodeficiency virus [HIV], etc.), or due to medications or treatments (example: therapies used to treat cancer and rheumatological diseases). However, even if you do not have one on these, it can still happen. If you have any of these conditions, or take one of these drugs, make sure to notify your treating physician. 4. Nerve Damage: This is more common when the treatment is an invasive one, but it can also happen with the use of medications, such as those used in the treatment of cancer.  The damage can occur to small secondary  nerves, or to large primary ones, such as those in the spinal cord and brain. This damage may be temporary or permanent and it may lead to impairments that can range from temporary numbness to permanent paralysis and/or brain death. 5. Allergic Reactions: Any time a substance or material comes in contact with our body, there is the possibility of an allergic reaction. These can range from a mild skin rash (contact dermatitis) to a severe systemic reaction (anaphylactic reaction), which can result in death. 6. Death: In general, any medical intervention can result in death, most of the time due to an unforeseen complication. ____________________________________________________________________________________________

## 2020-08-29 ENCOUNTER — Ambulatory Visit (HOSPITAL_BASED_OUTPATIENT_CLINIC_OR_DEPARTMENT_OTHER): Payer: BC Managed Care – PPO | Admitting: Pain Medicine

## 2020-08-29 ENCOUNTER — Other Ambulatory Visit: Payer: Self-pay

## 2020-08-29 ENCOUNTER — Ambulatory Visit
Admission: RE | Admit: 2020-08-29 | Discharge: 2020-08-29 | Disposition: A | Discharge status: Home / Self Care | Payer: BC Managed Care – PPO | Source: Ambulatory Visit | Attending: Pain Medicine | Admitting: Pain Medicine

## 2020-08-29 ENCOUNTER — Encounter: Payer: Self-pay | Admitting: Pain Medicine

## 2020-08-29 VITALS — BP 113/73 | HR 90 | Temp 97.8°F | Resp 16 | Ht 69.0 in | Wt 174.0 lb

## 2020-08-29 DIAGNOSIS — M5442 Lumbago with sciatica, left side: Secondary | ICD-10-CM | POA: Diagnosis present

## 2020-08-29 DIAGNOSIS — M5137 Other intervertebral disc degeneration, lumbosacral region: Secondary | ICD-10-CM | POA: Insufficient documentation

## 2020-08-29 DIAGNOSIS — G8929 Other chronic pain: Secondary | ICD-10-CM | POA: Insufficient documentation

## 2020-08-29 DIAGNOSIS — M5441 Lumbago with sciatica, right side: Secondary | ICD-10-CM | POA: Diagnosis present

## 2020-08-29 DIAGNOSIS — M79605 Pain in left leg: Secondary | ICD-10-CM | POA: Diagnosis present

## 2020-08-29 DIAGNOSIS — M961 Postlaminectomy syndrome, not elsewhere classified: Secondary | ICD-10-CM | POA: Insufficient documentation

## 2020-08-29 DIAGNOSIS — M79604 Pain in right leg: Secondary | ICD-10-CM | POA: Diagnosis present

## 2020-08-29 MED ORDER — MIDAZOLAM HCL 5 MG/5ML IJ SOLN
1.0000 mg | INTRAMUSCULAR | Status: DC | PRN
  Administered 2020-08-29: 1 mg via INTRAVENOUS
  Administered 2020-08-29: 2 mg via INTRAVENOUS

## 2020-08-29 MED ORDER — LIDOCAINE HCL (PF) 2 % IJ SOLN
INTRAMUSCULAR | Status: AC
  Filled 2020-08-29: qty 10

## 2020-08-29 MED ORDER — FENTANYL CITRATE (PF) 100 MCG/2ML IJ SOLN
25.0000 ug | INTRAMUSCULAR | Status: DC | PRN
  Administered 2020-08-29: 50 ug via INTRAVENOUS

## 2020-08-29 MED ORDER — LACTATED RINGERS IV SOLN
1000.0000 mL | Freq: Once | INTRAVENOUS | Status: AC
  Administered 2020-08-29: 1000 mL via INTRAVENOUS

## 2020-08-29 MED ORDER — ROPIVACAINE HCL 2 MG/ML IJ SOLN
INTRAMUSCULAR | Status: AC
  Filled 2020-08-29: qty 10

## 2020-08-29 MED ORDER — FENTANYL CITRATE (PF) 100 MCG/2ML IJ SOLN
INTRAMUSCULAR | Status: AC
  Filled 2020-08-29: qty 2

## 2020-08-29 MED ORDER — IOHEXOL 180 MG/ML  SOLN
10.0000 mL | Freq: Once | INTRAMUSCULAR | Status: AC
  Administered 2020-08-29: 10 mL via EPIDURAL

## 2020-08-29 MED ORDER — LIDOCAINE HCL 2 % IJ SOLN
20.0000 mL | Freq: Once | INTRAMUSCULAR | Status: AC
  Administered 2020-08-29: 200 mg
  Filled 2020-08-29: qty 20

## 2020-08-29 MED ORDER — IOHEXOL 180 MG/ML  SOLN
INTRAMUSCULAR | Status: AC
  Filled 2020-08-29: qty 20

## 2020-08-29 MED ORDER — ROPIVACAINE HCL 2 MG/ML IJ SOLN
2.0000 mL | Freq: Once | INTRAMUSCULAR | Status: AC
  Administered 2020-08-29: 2 mL via EPIDURAL

## 2020-08-29 MED ORDER — TRIAMCINOLONE ACETONIDE 40 MG/ML IJ SUSP
INTRAMUSCULAR | Status: AC
  Filled 2020-08-29: qty 1

## 2020-08-29 MED ORDER — MIDAZOLAM HCL 5 MG/5ML IJ SOLN
INTRAMUSCULAR | Status: AC
  Filled 2020-08-29: qty 5

## 2020-08-29 MED ORDER — SODIUM CHLORIDE (PF) 0.9 % IJ SOLN
INTRAMUSCULAR | Status: AC
  Filled 2020-08-29: qty 10

## 2020-08-29 MED ORDER — TRIAMCINOLONE ACETONIDE 40 MG/ML IJ SUSP
40.0000 mg | Freq: Once | INTRAMUSCULAR | Status: AC
  Administered 2020-08-29: 40 mg

## 2020-08-29 MED ORDER — SODIUM CHLORIDE 0.9% FLUSH
2.0000 mL | Freq: Once | INTRAVENOUS | Status: AC
  Administered 2020-08-29: 2 mL

## 2020-08-29 NOTE — Patient Instructions (Signed)

## 2020-08-29 NOTE — Progress Notes (Signed)
Safety precautions to be maintained throughout the outpatient stay will include: orient to surroundings, keep bed in low position, maintain call bell within reach at all times, provide assistance with transfer out of bed and ambulation.  

## 2020-08-29 NOTE — Progress Notes (Signed)
PROVIDER NOTE: Information contained herein reflects review and annotations entered in association with encounter. Interpretation of such information and data should be left to medically-trained personnel. Information provided to patient can be located elsewhere in the medical record under "Patient Instructions". Document created using STT-dictation technology, any transcriptional errors that may result from process are unintentional.    Patient: Derek [name]  Service Category: Procedure  Provider: Oswaldo Done, MD  DOB: 07/18/1980  DOS: 08/29/2020  Location: [facility]  MRN: [id number]  Setting: Ambulatory - outpatient  Referring Provider: Sherron Monday, MD  Type: Established Patient  Specialty: Interventional Pain Management  PCP: Sherron Monday, MD   Primary Reason for Visit: Interventional Pain Management Treatment. CC: Back Pain (low)  Procedure:          Anesthesia, Analgesia, Anxiolysis:  Type: Therapeutic Inter-Laminar Epidural Steroid Injection  #1  Region: Lumbar Level: L2-3 Level. Laterality: Right Paramedial  Type: Moderate (Conscious) Sedation combined with Local Anesthesia Indication(s): Analgesia and Anxiety Route: Intravenous (IV) IV Access: Secured Sedation: Meaningful verbal contact was maintained at all times during the procedure  Local Anesthetic: Lidocaine 1-2%  Position: Prone with head of the table was raised to facilitate breathing.   Indications: 1. DDD (degenerative disc disease), lumbosacral   2. Failed back surgical syndrome (x3)   3. 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 Chronic lower extremity pain (2ry area of Pain) (Bilateral) (L>R)    Pain Score: Pre-procedure: 6 /10 Post-procedure: 0-No pain/10   Pre-op H&P Assessment:  Mr. Coiner is a 40 y.o. (year old), male patient, seen today for interventional treatment. He  has a past surgical history that includes Back surgery;  HAND REIMPLANTED; and Lumbar fusion (11/14). Mr. Fedewa has a current medication list which includes the following prescription(s): gnp calcium 1200, cyclobenzaprine, diclofenac sodium, magnesium oxide, meloxicam, oxycodone hcl, [START ON 09/23/2020] oxycodone hcl, [START ON 10/23/2020] oxycodone hcl, and tizanidine, and the following Facility-Administered Medications: fentanyl and midazolam. His primarily concern today is the Back Pain (low)  Initial Vital Signs:  Pulse/HCG Rate: 97ECG Heart Rate: 92 Temp: 97.7 F (36.5 C) Resp: 16 BP: 125/81 SpO2: 98 %  BMI: Estimated body mass index is 25.7 kg/m as calculated from the following:   Height as of this encounter: 5\' 9"  (1.753 m).   Weight as of this encounter: 174 lb (78.9 kg).  Risk Assessment: Allergies: Reviewed. He is allergic to amoxicillin and vicodin [hydrocodone-acetaminophen].  Allergy Precautions: None required Coagulopathies: Reviewed. None identified.  Blood-thinner therapy: None at this time Active Infection(s): Reviewed. None identified. Mr. Wiegman is afebrile  Site Confirmation: Mr. Zuercher was asked to confirm the procedure and laterality before marking the site Procedure checklist: Completed Consent: Before the procedure and under the influence of no sedative(s), amnesic(s), or anxiolytics, the patient was informed of the treatment options, risks and possible complications. To fulfill our ethical and legal obligations, as recommended by the American Medical Association's Code of Ethics, I have informed the patient of my clinical impression; the nature and purpose of the treatment or procedure; the risks, benefits, and possible complications of the intervention; the alternatives, including doing nothing; the risk(s) and benefit(s) of the alternative treatment(s) or procedure(s); and the risk(s) and benefit(s) of doing nothing. The patient was provided information about the general risks and possible complications associated with the  procedure. These may include, but are not limited to: failure to achieve desired goals, infection, bleeding,  organ or nerve damage, allergic reactions, paralysis, and death. In addition, the patient was informed of those risks and complications associated to Spine-related procedures, such as failure to decrease pain; infection (i.e.: Meningitis, epidural or intraspinal abscess); bleeding (i.e.: epidural hematoma, subarachnoid hemorrhage, or any other type of intraspinal or peri-dural bleeding); organ or nerve damage (i.e.: Any type of peripheral nerve, nerve root, or spinal cord injury) with subsequent damage to sensory, motor, and/or autonomic systems, resulting in permanent pain, numbness, and/or weakness of one or several areas of the body; allergic reactions; (i.e.: anaphylactic reaction); and/or death. Furthermore, the patient was informed of those risks and complications associated with the medications. These include, but are not limited to: allergic reactions (i.e.: anaphylactic or anaphylactoid reaction(s)); adrenal axis suppression; blood sugar elevation that in diabetics may result in ketoacidosis or comma; water retention that in patients with history of congestive heart failure may result in shortness of breath, pulmonary edema, and decompensation with resultant heart failure; weight gain; swelling or edema; medication-induced neural toxicity; particulate matter embolism and blood vessel occlusion with resultant organ, and/or nervous system infarction; and/or aseptic necrosis of one or more joints. Finally, the patient was informed that Medicine is not an exact science; therefore, there is also the possibility of unforeseen or unpredictable risks and/or possible complications that may result in a catastrophic outcome. The patient indicated having understood very clearly. We have given the patient no guarantees and we have made no promises. Enough time was given to the patient to ask questions, all of  which were answered to the patient's satisfaction. Mr. Catala has indicated that he wanted to continue with the procedure. Attestation: I, the ordering provider, attest that I have discussed with the patient the benefits, risks, side-effects, alternatives, likelihood of achieving goals, and potential problems during recovery for the procedure that I have provided informed consent. Date  Time: 08/29/2020  8:04 AM  Pre-Procedure Preparation:  Monitoring: As per clinic protocol. Respiration, ETCO2, SpO2, BP, heart rate and rhythm monitor placed and checked for adequate function Safety Precautions: Patient was assessed for positional comfort and pressure points before starting the procedure. Time-out: I initiated and conducted the "Time-out" before starting the procedure, as per protocol. The patient was asked to participate by confirming the accuracy of the "Time Out" information. Verification of the correct person, site, and procedure were performed and confirmed by me, the nursing staff, and the patient. "Time-out" conducted as per Joint Commission's Universal Protocol (UP.01.01.01). Time: 0830  Description of Procedure:          Target Area: The interlaminar space, initially targeting the lower laminar border of the superior vertebral body. Approach: Paramedial approach. Area Prepped: Entire Posterior Lumbar Region DuraPrep (Iodine Povacrylex [0.7% available iodine] and Isopropyl Alcohol, 74% w/w) Safety Precautions: Aspiration looking for blood return was conducted prior to all injections. At no point did we inject any substances, as a needle was being advanced. No attempts were made at seeking any paresthesias. Safe injection practices and needle disposal techniques used. Medications properly checked for expiration dates. SDV (single dose vial) medications used. Description of the Procedure: Protocol guidelines were followed. The procedure needle was introduced through the skin, ipsilateral to the  reported pain, and advanced to the target area. Bone was contacted and the needle walked caudad, until the lamina was cleared. The epidural space was identified using "loss-of-resistance technique" with 2-3 ml of PF-NaCl (0.9% NSS), in a 5cc LOR glass syringe.  Vitals:   08/29/20 1610 08/29/20 9604 08/29/20 5409  08/29/20 0902  BP: (!) 112/55 118/79 116/84 113/73  Pulse: 90     Resp: 15 14 12 16   Temp:  97.8 F (36.6 C)  97.8 F (36.6 C)  TempSrc:  Temporal  Temporal  SpO2: 97% 99% 99% 100%  Weight:      Height:        Start Time: 0830 hrs. End Time: 0838 hrs.  Materials:  Needle(s) Type: Epidural needle Gauge: 17G Length: 3.5-in Medication(s): Please see orders for medications and dosing details.  Imaging Guidance (Spinal):          Type of Imaging Technique: Fluoroscopy Guidance (Spinal) Indication(s): Assistance in needle guidance and placement for procedures requiring needle placement in or near specific anatomical locations not easily accessible without such assistance. Exposure Time: Please see nurses notes. Contrast: Before injecting any contrast, we confirmed that the patient did not have an allergy to iodine, shellfish, or radiological contrast. Once satisfactory needle placement was completed at the desired level, radiological contrast was injected. Contrast injected under live fluoroscopy. No contrast complications. See chart for type and volume of contrast used. Fluoroscopic Guidance: I was personally present during the use of fluoroscopy. "Tunnel Vision Technique" used to obtain the best possible view of the target area. Parallax error corrected before commencing the procedure. "Direction-depth-direction" technique used to introduce the needle under continuous pulsed fluoroscopy. Once target was reached, antero-posterior, oblique, and lateral fluoroscopic projection used confirm needle placement in all planes. Images permanently stored in EMR. Interpretation: I personally  interpreted the imaging intraoperatively. Adequate needle placement confirmed in multiple planes. Appropriate spread of contrast into desired area was observed. No evidence of afferent or efferent intravascular uptake. No intrathecal or subarachnoid spread observed. Permanent images saved into the patient's record.  Antibiotic Prophylaxis:   Anti-infectives (From admission, onward)   None     Indication(s): None identified  Post-operative Assessment:  Post-procedure Vital Signs:  Pulse/HCG Rate: 90 (NSR)91 Temp: 97.8 F (36.6 C) Resp: 16 BP: 113/73 SpO2: 100 %  EBL: None  Complications: No immediate post-treatment complications observed by team, or reported by patient.  Note: The patient tolerated the entire procedure well. A repeat set of vitals were taken after the procedure and the patient was kept under observation following institutional policy, for this type of procedure. Post-procedural neurological assessment was performed, showing return to baseline, prior to discharge. The patient was provided with post-procedure discharge instructions, including a section on how to identify potential problems. Should any problems arise concerning this procedure, the patient was given instructions to immediately contact , at any time, without hesitation. In any case, we plan to contact the patient by telephone for a follow-up status report regarding this interventional procedure.  Comments:  No additional relevant information.  Plan of Care  Orders:  Orders Placed This Encounter  Procedures  . Lumbar Epidural Injection    Scheduling Instructions:     Procedure: Interlaminar LESI L2-3     Laterality: Right-sided     Sedation: Patient's choice     Timeframe:  Today    Order Specific Question:   Where will this procedure be performed?    Answer:   [facility]  . [facility] C-ARM 1-60 MIN NO REPORT    Intraoperative interpretation by procedural physician at [facility].    Standing Status:   Standing    Number of Occurrences:   1    Order Specific Question:   Reason for exam:    Answer:  Assistance in needle guidance and placement for procedures requiring needle placement in or near specific anatomical locations not easily accessible without such assistance.  . Informed Consent Details: Physician/Practitioner Attestation; Transcribe to consent form and obtain patient signature    Note: Always confirm laterality of pain with [name], before procedure. Transcribe to consent form and obtain patient signature.    Order Specific Question:   Physician/Practitioner attestation of informed consent for procedure/surgical case    Answer:   I, the physician/practitioner, attest that I have discussed with the patient the benefits, risks, side effects, alternatives, likelihood of achieving goals and potential problems during recovery for the procedure that I have provided informed consent.    Order Specific Question:   Procedure    Answer:   Lumbar epidural steroid injection under fluoroscopic guidance    Order Specific Question:   Physician/Practitioner performing the procedure    Answer:   Elga Santy A. Laban EmperorNaveira, MD    Order Specific Question:   Indication/Reason    Answer:   Low back and/or lower extremity pain secondary to lumbar radiculitis  . Provide equipment / supplies at bedside    "Epidural Tray" (Disposable  single use) Catheter: NOT required    Standing Status:   Standing    Number of Occurrences:   1    Order Specific Question:   Specify    Answer:   Epidural Tray   Chronic Opioid Analgesic:  OxycodoneIR10 mg, 1 tab PO q 6 hrs (40 mg/day of oxycodone) MME/day:60mg /day.   Medications ordered for procedure: Meds ordered this encounter  Medications  . iohexol (OMNIPAQUE) 180 MG/ML injection 10 mL    Must be Myelogram-compatible. If not available, you may substitute with a water-soluble, non-ionic, hypoallergenic, myelogram-compatible  radiological contrast medium.  Marland Kitchen. lidocaine (XYLOCAINE) 2 % (with pres) injection 400 mg  . lactated ringers infusion 1,000 mL  . midazolam (VERSED) 5 MG/5ML injection 1-2 mg    Make sure Flumazenil is available in the pyxis when using this medication. If oversedation occurs, administer 0.2 mg IV over 15 sec. If after 45 sec no response, administer 0.2 mg again over 1 min; may repeat at 1 min intervals; not to exceed 4 doses (1 mg)  . fentaNYL (SUBLIMAZE) injection 25-50 mcg    Make sure Narcan is available in the pyxis when using this medication. In the event of respiratory depression (RR< 8/min): Titrate NARCAN (naloxone) in increments of 0.1 to 0.2 mg IV at 2-3 minute intervals, until desired degree of reversal.  . sodium chloride flush (NS) 0.9 % injection 2 mL  . ropivacaine (PF) 2 mg/mL (0.2%) (NAROPIN) injection 2 mL  . triamcinolone acetonide (KENALOG-40) injection 40 mg   Medications administered: We administered iohexol, lidocaine, lactated ringers, midazolam, fentaNYL, sodium chloride flush, ropivacaine (PF) 2 mg/mL (0.2%), and triamcinolone acetonide.  See the medical record for exact dosing, route, and time of administration.  Follow-up plan:   Return in about 2 weeks (around 09/12/2020) for (VIrtual), (PP) Follow-up.       Interventional Pending:      Under consideration:   Diagnostic bilateral lumbar facet block #1 Possible bilateral lumbar facet RFA Diagnostic bilateral sacroiliac joint block #1 Possible bilateral sacroiliac joint RFA Possible spinal cord stimulator trial   Palliative care options:   Palliative right lateral epicondyle (elbow) injection #2  Palliative left Racz procedure #5(targeting the left S2, S3 area) (last done 05/02/2020)    Recent Visits Date Type Provider Dept  08/19/20 Office Visit Delano MetzNaveira, Elah Avellino, MD  [facility]  Showing recent visits within past 90 days and meeting all other requirements Today's Visits Date Type  Provider Dept  08/29/20 Procedure visit Delano Metz, MD [facility]  Showing today's visits and meeting all other requirements Future Appointments Date Type Provider Dept  09/16/20 Appointment Delano Metz, MD [facility]  11/18/20 Appointment Delano Metz, MD [facility]  Showing future appointments within next 90 days and meeting all other requirements  Disposition: Discharge home  Discharge (Date  Time): 08/29/2020; 0903 hrs.   Primary Care Physician: Sherron Monday, MD Location: [facility] Note by: Oswaldo Done, MD Date: 08/29/2020; Time: 9:48 AM  Disclaimer:  Medicine is not an Visual merchandiser. The only guarantee in medicine is that nothing is guaranteed. It is important to note that the decision to proceed with this intervention was based on the information collected from the patient. The Data and conclusions were drawn from the patient's questionnaire, the interview, and the physical examination. Because the information was provided in large part by the patient, it cannot be guaranteed that it has not been purposely or unconsciously manipulated. Every effort has been made to obtain as much relevant data as possible for this evaluation. It is important to note that the conclusions that lead to this procedure are derived in large part from the available data. Always take into account that the treatment will also be dependent on availability of resources and existing treatment guidelines, considered by other Pain Management Practitioners as being common knowledge and practice, at the time of the intervention. For Medico-Legal purposes, it is also important to point out that variation in procedural techniques and pharmacological choices are the acceptable norm. The indications, contraindications, technique, and results of the above procedure should only be interpreted and judged by a Board-Certified  Interventional Pain Specialist with extensive familiarity and expertise in the same exact procedure and technique.

## 2020-08-30 ENCOUNTER — Telehealth: Payer: Self-pay | Admitting: *Deleted

## 2020-08-30 NOTE — Telephone Encounter (Signed)
Spoke to patient re; procedure on yesterday, denies any relief, no issues over night.  Encouraged to give steroids a chance to kick in.  Will call if he has any issues.

## 2020-09-14 NOTE — Progress Notes (Signed)
Patient: Derek [name]  Service Category: E/M  Provider: Gaspar Cola, MD  DOB: April 18, 1981  DOS: 09/16/2020  Location: Office  MRN: [id number]  Setting: Ambulatory outpatient  Referring Provider: Jodi Marble, MD  Type: Established Patient  Specialty: Interventional Pain Management  PCP: Jodi Marble, MD  Location: Remote location  Delivery: TeleHealth     Virtual Encounter - Pain Management PROVIDER NOTE: Information contained herein reflects review and annotations entered in association with encounter. Interpretation of such information and data should be left to medically-trained personnel. Information provided to patient can be located elsewhere in the medical record under "Patient Instructions". Document created using STT-dictation technology, any transcriptional errors that may result from process are unintentional.    Contact & Pharmacy Preferred: [phone] Home: [phone] (home) Mobile: [phone] (mobile) E-mail: [email or web address]  [place], [place] [address] [place] [address] Phone: [phone] Fax: [phone]   Pre-screening  Derek [name] offered "in-person" vs "virtual" encounter. He indicated preferring virtual for this encounter.   Reason COVID-19*  Social distancing based on CDC and AMA recommendations.   I contacted Derek [name] on 09/16/2020 via telephone.      I clearly identified myself as Gaspar Cola, MD. I verified that I was speaking with the correct person using two identifiers (Name: Derek [name], and date of birth: [birth year]).  Consent I sought verbal advanced consent from Derek [name] for virtual visit interactions. I informed Derek [name] of possible security and privacy concerns, risks, and limitations associated with providing "not-in-person" medical evaluation and management services. I also informed Derek [name] of the availability of "in-person" appointments. Finally, I  informed him that there would be a charge for the virtual visit and that he could be  personally, fully or partially, financially responsible for it. Derek [name] expressed understanding and agreed to proceed.   Historic Elements   Derek [name] is a 40 y.o. year old, male patient evaluated today after our last contact on 08/29/2020. Derek [name]  has a past medical history of Anxiety, Arthritis, and GERD (gastroesophageal reflux disease). He also  has a past surgical history that includes Back surgery; HAND REIMPLANTED; and Lumbar fusion (11/14). Derek [name] has a current medication list which includes the following prescription(s): gnp calcium 1200, cyclobenzaprine, diclofenac sodium, magnesium oxide, meloxicam, oxycodone hcl, [START ON 09/23/2020] oxycodone hcl, [START ON 10/23/2020] oxycodone hcl, and tizanidine. He  reports that he has been smoking. He has a 16.00 pack-year smoking history. He has never used smokeless tobacco. He reports current alcohol use. He reports that he does not use drugs. Derek [name] is allergic to amoxicillin and vicodin [hydrocodone-acetaminophen].   HPI  Today, he is being contacted for a post-procedure assessment.  Interestingly, he refers that for the first couple days after the procedure the pain did not improve, in fact it got worse.  It was not until Sunday that he was laying down on his back and when he got up he felt a pop in his back and this was followed by a relief of his pain.  On 08/19/2020 I had ordered an x-ray of the lumbar spine on flexion and extension and unfortunately this x-ray order expired today.  Today I will go ahead and so that he can have it done and perhaps this will help [place] understand better what was the "pop" that he experienced.  Post-Procedure Evaluation  Procedure (08/29/2020): Therapeutic right L2-3 LESI #  1 under fluoroscopic guidance and IV sedation Pre-procedure pain level: 6/10 Post-procedure: 0/10 (100% relief)  Sedation: Sedation provided.   Effectiveness during initial hour after procedure(Ultra-Short Term Relief): 0 %.  Local anesthetic used: Long-acting (4-6 hours) Effectiveness: Defined as any analgesic benefit obtained secondary to the administration of local anesthetics. This carries significant diagnostic value as to the etiological location, or anatomical origin, of the pain. Duration of benefit is expected to coincide with the duration of the local anesthetic used.  Effectiveness during initial 4-6 hours after procedure(Short-Term Relief): 0 %.  Long-term benefit: Defined as any relief past the pharmacologic duration of the local anesthetics.  Effectiveness past the initial 6 hours after procedure(Long-Term Relief): 75 %.  Current benefits: Defined as benefit that persist at this time.   Analgesia:  >75% relief Function: Derek [name] reports improvement in function ROM: Derek [name] reports improvement in ROM  Pharmacotherapy Assessment  Analgesic: OxycodoneIR10 mg, 1 tab PO q 6 hrs (40 mg/day of oxycodone) MME/day:83m/day.   Monitoring: [place] PMP: PDMP reviewed during this encounter.       Pharmacotherapy: No side-effects or adverse reactions reported. Compliance: No problems identified. Effectiveness: Clinically acceptable. Plan: Refer to "POC".  UDS:  Summary  Date Value Ref Range Status  02/12/2020 Note  Final    Comment:    ==================================================================== [employer] [facility] (MW) ==================================================================== Test                             Result       Flag       Units  Drug Present and Declared for Prescription Verification   Oxycodone                      2323         EXPECTED   ng/mg creat   Oxymorphone                    7355         EXPECTED   ng/mg creat   Noroxycodone                   2746         EXPECTED   ng/mg creat   Noroxymorphone                 805          EXPECTED   ng/mg creat    Sources of oxycodone are  scheduled prescription medications.    Oxymorphone, noroxycodone, and noroxymorphone are expected    metabolites of oxycodone. Oxymorphone is also available as a    scheduled prescription medication.  ==================================================================== Test                      Result    Flag   Units      Ref Range   Creatinine              65               mg/dL      >=20 ==================================================================== Declared Medications:  The flagging and interpretation on this report are based on the  following declared medications.  Unexpected results may arise from  inaccuracies in the declared medications.   **Note: The testing scope of this panel includes these medications:   Oxycodone   **Note: The testing scope of this panel does not include  the  following reported medications:   Calcium  Cyclobenzaprine (Flexeril)  Diclofenac  Magnesium (Mag-Ox)  Meloxicam (Mobic)  Tizanidine (Zanaflex) ==================================================================== For clinical consultation, please call [phone]. ====================================================================     Laboratory Chemistry Profile   Renal Lab Results  Component Value Date   BUN 11 01/16/2019   CREATININE 1.08 01/16/2019   BCR 10 01/16/2019   GFRAA 99 01/16/2019   GFRNONAA 86 01/16/2019     Hepatic Lab Results  Component Value Date   AST 24 01/16/2019   ALBUMIN 4.6 01/16/2019   ALKPHOS 108 01/16/2019     Electrolytes Lab Results  Component Value Date   NA 143 01/16/2019   K 4.0 01/16/2019   CL 105 01/16/2019   CALCIUM 10.2 01/16/2019   MG 2.0 01/16/2019     Bone Lab Results  Component Value Date   25OHVITD1 39 01/16/2019   25OHVITD2 1.2 01/16/2019   25OHVITD3 38 01/16/2019     Inflammation (CRP: Acute Phase) (ESR: Chronic Phase) Lab Results  Component Value Date   CRP 4 01/16/2019   ESRSEDRATE 29 (H) 01/16/2019        Note: Above Lab results reviewed.  Imaging  DG PAIN CLINIC C-ARM 1-60 MIN NO REPORT Fluoro was used, but no Radiologist interpretation will be provided.  Please refer to "NOTES" tab for provider progress note.  Assessment  The primary encounter diagnosis was Chronic pain syndrome. Diagnoses of Failed back surgical syndrome (x3), Chronic low back pain (1ry area of Pain) (Bilateral) (L>R) w/ sciatica (Bilateral), Burning pain in thigh (Right), Epidural fibrosis, and Chronic lower extremity pain (2ry area of Pain) (Bilateral) (L>R) were also pertinent to this visit.  Plan of Care  Problem-specific:  No problem-specific Assessment & Plan notes found for this encounter.  Derek [name] has a current medication list which includes the following long-term medication(s): gnp calcium 1200, cyclobenzaprine, meloxicam, oxycodone hcl, [START ON 09/23/2020] oxycodone hcl, [START ON 10/23/2020] oxycodone hcl, and tizanidine.  Pharmacotherapy (Medications Ordered): No orders of the defined types were placed in this encounter.  Orders:  Orders Placed This Encounter  Procedures  . DG Lumbar Spine Complete W/Bend    Patient presents with axial pain with possible radicular component.  In addition to any acute findings, please report on:  1. Facet (Zygapophyseal) joint DJD (Hypertrophy, space narrowing, subchondral sclerosis, and/or osteophyte formation) 2. DDD and/or IVDD (Loss of disc height, desiccation or "Black disc disease") 3. Pars defects 4. Spondylolisthesis, spondylosis, and/or spondyloarthropathies (include Degree/Grade of displacement in mm) 5. Vertebral body Fractures, including age (old, new/acute) 54. Modic Type Changes 7. Demineralization 8. Bone pathology 9. Central, Lateral Recess, and/or Foraminal Stenosis (include AP diameter of stenosis in mm) 10. Surgical changes (hardware type, status, and presence of fibrosis)  NOTE: Please specify level(s) and laterality. If applicable:  Please indicate ROM and/or evidence of instability (>65m displacement between flexion and extension views)    Standing Status:   Future    Standing Expiration Date:   10/17/2020    Scheduling Instructions:     Imaging must be done as soon as possible. Inform patient that order will expire within 30 days and I will not renew it.    Order Specific Question:   Reason for Exam (SYMPTOM  OR DIAGNOSIS REQUIRED)    Answer:   Low back pain    Order Specific Question:   Preferred imaging location?    Answer:   [facility]    Order Specific Question:   Call Results-  Best Contact Number?    Answer:   [phone] ([facility])    Order Specific Question:   Radiology Contrast Protocol - do NOT remove file path    Answer:   [email or web address]    Order Specific Question:   Release to patient    Answer:   Immediate   Follow-up plan:   Return for scheduled encounter (11/18/2020) to review results of lumbar x-ray.      Interventional Pending:      Under consideration:   Diagnostic bilateral lumbar facet block #1 Possible bilateral lumbar facet RFA Diagnostic bilateral sacroiliac joint block #1 Possible bilateral sacroiliac joint RFA Possible spinal cord stimulator trial   Palliative care options:   Palliative right lateral epicondyle (elbow) injection #2  Palliative left Racz procedure #5(targeting the left S2, S3 area) (last done 05/02/2020)    Recent Visits Date Type Provider Dept  08/29/20 Procedure visit Milinda Pointer, MD [facility]  08/19/20 Office Visit Milinda Pointer, MD [facility]  Showing recent visits within past 90 days and meeting all other requirements Today's Visits Date Type Provider Dept  09/16/20 Telemedicine Milinda Pointer, MD [facility]  Showing today's visits and meeting all other requirements Future Appointments Date Type Provider Dept  11/18/20 Appointment Milinda Pointer, MD [facility]  Showing future appointments within next 90 days and meeting all other requirements  I discussed the assessment and treatment plan with the patient. The patient was provided an opportunity to ask questions and all were answered. The patient agreed with the plan and demonstrated an understanding of the instructions.  Patient advised to call back or seek an in-person evaluation if the symptoms or condition worsens.  Duration of encounter: 15 minutes.  Note by: Gaspar Cola, MD Date: 09/16/2020; Time: 4:59 PM

## 2020-09-16 ENCOUNTER — Other Ambulatory Visit: Payer: Self-pay

## 2020-09-16 ENCOUNTER — Ambulatory Visit: Payer: BC Managed Care – PPO | Attending: Pain Medicine | Admitting: Pain Medicine

## 2020-09-16 DIAGNOSIS — G96198 Other disorders of meninges, not elsewhere classified: Secondary | ICD-10-CM

## 2020-09-16 DIAGNOSIS — M79605 Pain in left leg: Secondary | ICD-10-CM

## 2020-09-16 DIAGNOSIS — G894 Chronic pain syndrome: Secondary | ICD-10-CM

## 2020-09-16 DIAGNOSIS — M5442 Lumbago with sciatica, left side: Secondary | ICD-10-CM

## 2020-09-16 DIAGNOSIS — R52 Pain, unspecified: Secondary | ICD-10-CM

## 2020-09-16 DIAGNOSIS — G8929 Other chronic pain: Secondary | ICD-10-CM

## 2020-09-16 DIAGNOSIS — M79604 Pain in right leg: Secondary | ICD-10-CM

## 2020-09-16 DIAGNOSIS — M961 Postlaminectomy syndrome, not elsewhere classified: Secondary | ICD-10-CM

## 2020-09-16 DIAGNOSIS — M5441 Lumbago with sciatica, right side: Secondary | ICD-10-CM

## 2020-09-18 ENCOUNTER — Ambulatory Visit
Admission: RE | Admit: 2020-09-18 | Discharge: 2020-09-18 | Disposition: A | Discharge status: Home / Self Care | Payer: BC Managed Care – PPO | Source: Ambulatory Visit | Attending: Pain Medicine | Admitting: Pain Medicine

## 2020-09-18 ENCOUNTER — Ambulatory Visit
Admission: RE | Admit: 2020-09-18 | Discharge: 2020-09-18 | Disposition: A | Discharge status: Home / Self Care | Payer: BC Managed Care – PPO | Attending: Pain Medicine | Admitting: Pain Medicine

## 2020-09-18 DIAGNOSIS — M961 Postlaminectomy syndrome, not elsewhere classified: Secondary | ICD-10-CM | POA: Insufficient documentation

## 2020-09-18 DIAGNOSIS — R52 Pain, unspecified: Secondary | ICD-10-CM

## 2020-09-18 DIAGNOSIS — G8929 Other chronic pain: Secondary | ICD-10-CM | POA: Diagnosis present

## 2020-09-18 DIAGNOSIS — M5441 Lumbago with sciatica, right side: Secondary | ICD-10-CM | POA: Insufficient documentation

## 2020-09-18 DIAGNOSIS — M5442 Lumbago with sciatica, left side: Secondary | ICD-10-CM | POA: Insufficient documentation

## 2020-11-14 NOTE — Progress Notes (Signed)
PROVIDER NOTE: Information contained herein reflects review and annotations entered in association with encounter. Interpretation of such information and data should be left to medically-trained personnel. Information provided to patient can be located elsewhere in the medical record under "Patient Instructions". Document created using STT-dictation technology, any transcriptional errors that may result from process are unintentional.    Patient: Derek [name]  Service Category: E/M  Provider: Gaspar Cola, MD  DOB: 1981/06/01  DOS: 11/18/2020  Specialty: Interventional Pain Management  MRN: [id number]  Setting: Ambulatory outpatient  PCP: Jodi Marble, MD  Type: Established Patient    Referring Provider: Jodi Marble, MD  Location: Office  Delivery: Face-to-face     HPI  [name], a 40 y.o. year old male, is here today because of his Chronic pain syndrome [G89.4]. [name] primary complain today is Back Pain (low) and Leg Pain (Bilateral but left is worse) Last encounter: My last encounter with him was on 08/29/2020. Pertinent problems: [name] has Lumbar pseudoarthrosis (L5-S1); Chronic low back pain (1ry area of Pain) (Bilateral) (L>R) w/ sciatica (Bilateral); Chronic lower extremity pain (2ry area of Pain) (Bilateral) (L>R); Chronic pain syndrome; Failed back surgical syndrome (x3); L5-S1 pseudoarthrosis; Chronic musculoskeletal pain; Spasm of back muscles; Sacroiliac joint dysfunction (Bilateral); Chronic sacroiliac joint pain (Bilateral) (L>R); Somatic dysfunction of sacroiliac joints (Bilateral); Chronic hip pain (Bilateral); Epidural fibrosis; Lumbar postlaminectomy syndrome; DDD (degenerative disc disease), lumbosacral; Lumbar facet syndrome (Bilateral) (L>R); Other specified dorsopathies, sacral and sacrococcygeal region; Spondylosis without myelopathy or radiculopathy, lumbosacral region; Neurogenic pain; Osteoarthritic spondylosis of lumbar spine; Tendonitis of  elbow, right; Lateral epicondylitis of elbow (Right); Chronic elbow pain (Right); Chronic low back pain (Left) w/o sciatica; Numbness of anterior thigh (Right); Burning pain in thigh (Right); Chronic low back pain (Midline) w/o sciatica; and Acute exacerbation of chronic low back pain on their pertinent problem list. Pain Assessment: Severity of   is reported as a 4 /10. Location: Back Lower/legs. left is worse.. Onset: More than a month ago. Quality: Aching,Constant,Sharp (pulsating). Timing: Constant. Modifying factor(s): rest, medications, heat, ice, topicals. Vitals:  height is '5\' 10"'  (1.778 m) and weight is 170 lb (77.1 kg). His temporal temperature is 98.4 F (36.9 C). His blood pressure is 125/73 and his pulse is 90. His respiration is 18 and oxygen saturation is 99%.   Reason for encounter: medication management.   The patient indicates doing well with the current medication regimen. No adverse reactions or side effects reported to the medications.  Today I went over the results of his lumbar spine x-rays, which I have explained to him in layman's terms.  He also had some questions regarding shooting pains that he experiences down the left leg whenever he either has severe coughing or sneezing.  I have explained to him that this has to do with the scar tissue wrapped around the nerve and is simply a manifestation of what we have been treating him for.  He understood.  RTCB: 02/20/2021 Nonopioids transferred 05/02/2020: Zanaflex, Flexeril, and Mobic  Pharmacotherapy Assessment   Analgesic: OxycodoneIR10 mg, 1 tab PO q 6 hrs (40 mg/day of oxycodone) MME/day:84m/day.   Monitoring: [place] PMP: PDMP reviewed during this encounter.       Pharmacotherapy: No side-effects or adverse reactions reported. Compliance: No problems identified. Effectiveness: Clinically acceptable.  SHart Rochester RN  11/18/2020  8:39 AM  Sign when Signing Visit Nursing Pain Medication Assessment:  Safety  precautions to be maintained throughout the outpatient stay  will include: orient to surroundings, keep bed in low position, maintain call bell within reach at all times, provide assistance with transfer out of bed and ambulation.  Medication Inspection Compliance: Pill count conducted under aseptic conditions, in front of the patient. Neither the pills nor the bottle was removed from the patient's sight at any time. Once count was completed pills were immediately returned to the patient in their original bottle.  Medication: Oxycodone IR Pill/Patch Count: 18 of 120 pills remain Pill/Patch Appearance: Markings consistent with prescribed medication Bottle Appearance: Standard pharmacy container. Clearly labeled. Filled Date: 04 / 20 / 2022 Last Medication intake:  Today    UDS:  Summary  Date Value Ref Range Status  02/12/2020 Note  Final    Comment:    ==================================================================== [employer] [facility] (MW) ==================================================================== Test                             Result       Flag       Units  Drug Present and Declared for Prescription Verification   Oxycodone                      2323         EXPECTED   ng/mg creat   Oxymorphone                    7355         EXPECTED   ng/mg creat   Noroxycodone                   2746         EXPECTED   ng/mg creat   Noroxymorphone                 805          EXPECTED   ng/mg creat    Sources of oxycodone are scheduled prescription medications.    Oxymorphone, noroxycodone, and noroxymorphone are expected    metabolites of oxycodone. Oxymorphone is also available as a    scheduled prescription medication.  ==================================================================== Test                      Result    Flag   Units      Ref Range   Creatinine              65               mg/dL       >=20 ==================================================================== Declared Medications:  The flagging and interpretation on this report are based on the  following declared medications.  Unexpected results may arise from  inaccuracies in the declared medications.   **Note: The testing scope of this panel includes these medications:   Oxycodone   **Note: The testing scope of this panel does not include the  following reported medications:   Calcium  Cyclobenzaprine (Flexeril)  Diclofenac  Magnesium (Mag-Ox)  Meloxicam (Mobic)  Tizanidine (Zanaflex) ==================================================================== For clinical consultation, please call [phone]. ====================================================================      ROS  Constitutional: Denies any fever or chills Gastrointestinal: No reported hemesis, hematochezia, vomiting, or acute GI distress Musculoskeletal: Denies any acute onset joint swelling, redness, loss of ROM, or weakness Neurological: No reported episodes of acute onset apraxia, aphasia, dysarthria, agnosia, amnesia, paralysis, loss of coordination, or loss of consciousness  Medication Review  Diclofenac Sodium,  GNP Calcium 1200, Oxycodone HCl, cyclobenzaprine, meloxicam, and tiZANidine  History Review  Allergy: Mr. Ackerley is allergic to amoxicillin and vicodin [hydrocodone-acetaminophen]. Drug: Mr. Rocchi  reports no history of drug use. Alcohol:  reports current alcohol use. Tobacco:  reports that he has been smoking. He has a 16.00 pack-year smoking history. He has never used smokeless tobacco. Social: Mr. Bradly  reports that he has been smoking. He has a 16.00 pack-year smoking history. He has never used smokeless tobacco. He reports current alcohol use. He reports that he does not use drugs. Medical:  has a past medical history of Anxiety, Arthritis, and GERD (gastroesophageal reflux disease). Surgical: Mr. Radin  has a past  surgical history that includes Back surgery; HAND REIMPLANTED; and Lumbar fusion (11/14). Family: family history is not on file.  Laboratory Chemistry Profile   Renal Lab Results  Component Value Date   BUN 11 01/16/2019   CREATININE 1.08 01/16/2019   BCR 10 01/16/2019   GFRAA 99 01/16/2019   GFRNONAA 86 01/16/2019     Hepatic Lab Results  Component Value Date   AST 24 01/16/2019   ALBUMIN 4.6 01/16/2019   ALKPHOS 108 01/16/2019     Electrolytes Lab Results  Component Value Date   NA 143 01/16/2019   K 4.0 01/16/2019   CL 105 01/16/2019   CALCIUM 10.2 01/16/2019   MG 2.0 01/16/2019     Bone Lab Results  Component Value Date   25OHVITD1 39 01/16/2019   25OHVITD2 1.2 01/16/2019   25OHVITD3 38 01/16/2019     Inflammation (CRP: Acute Phase) (ESR: Chronic Phase) Lab Results  Component Value Date   CRP 4 01/16/2019   ESRSEDRATE 29 (H) 01/16/2019       Note: Above Lab results reviewed.  Recent Imaging Review  DG Lumbar Spine Complete W/Bend CLINICAL DATA:  Low back pain, spinal fusion in 2006 with pain radiating down legs on occasion.  EXAM: LUMBAR SPINE - COMPLETE WITH BENDING VIEWS  COMPARISON:  Nov 30, 2017  FINDINGS: As on the previous study there are changes of lumbosacral spinal fusion and interbody cage placement. No change in alignment alignment. Mild retrolisthesis of L3 on L4 approximately 5 mm seen on both flexion and extension, previously seen on extension but without change over flexion and extension. Hardware with similar appearance.  IMPRESSION: 1. No change in the appearance of spinal alignment, no abnormal motion following L4 through sacroiliac fusion.  Electronically Signed   By: Zetta Bills M.D.   On: 09/19/2020 10:39 Note: Reviewed        Physical Exam  General appearance: Well nourished, well developed, and well hydrated. In no apparent acute distress Mental status: Alert, oriented x 3 (person, place, & time)        Respiratory: No evidence of acute respiratory distress Eyes: PERLA Vitals: BP 125/73   Pulse 90   Temp 98.4 F (36.9 C) (Temporal)   Resp 18   Ht '5\' 10"'  (1.778 m)   Wt 170 lb (77.1 kg)   SpO2 99%   BMI 24.39 kg/m  BMI: Estimated body mass index is 24.39 kg/m as calculated from the following:   Height as of this encounter: '5\' 10"'  (1.778 m).   Weight as of this encounter: 170 lb (77.1 kg). Ideal: Ideal body weight: 73 kg (160 lb 15 oz) Adjusted ideal body weight: 74.6 kg (164 lb 9 oz)  Assessment   Status Diagnosis  Controlled Controlled Controlled 1. Chronic pain syndrome   2. Failed  back surgical syndrome (x3)   3. 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 Burning pain in thigh (Right)   5. Epidural fibrosis   6. Chronic lower extremity pain (2ry area of Pain) (Bilateral) (L>R)   7. Pharmacologic therapy   8. Chronic use of opiate for therapeutic purpose   9. Uncomplicated opioid dependence ([place])      Updated Problems: Problem  Chronic Use of Opiate for Therapeutic Purpose  Uncomplicated Opioid Dependence ([facility])    Plan of Care  Problem-specific:  No problem-specific Assessment & Plan notes found for this encounter.  Mr. AMAREE LEEPER has a current medication list which includes the following long-term medication(s): cyclobenzaprine, meloxicam, [START ON 11/22/2020] oxycodone hcl, [START ON 12/22/2020] oxycodone hcl, [START ON 01/21/2021] oxycodone hcl, tizanidine, and gnp calcium 1200.  Pharmacotherapy (Medications Ordered): Meds ordered this encounter  Medications  . Oxycodone HCl 10 MG TABS    Sig: Take 1 tablet (10 mg total) by mouth every 6 (six) hours as needed. Must last 30 days    Dispense:  120 tablet    Refill:  0    Not a duplicate. Do NOT delete! Dispense 1 day early if closed on refill date. Avoid benzodiazepines within 8 hours of opioids. Do not send refill requests.  . Oxycodone HCl 10 MG TABS    Sig:  Take 1 tablet (10 mg total) by mouth every 6 (six) hours as needed. Must last 30 days    Dispense:  120 tablet    Refill:  0    Not a duplicate. Do NOT delete! Dispense 1 day early if closed on refill date. Avoid benzodiazepines within 8 hours of opioids. Do not send refill requests.  . Oxycodone HCl 10 MG TABS    Sig: Take 1 tablet (10 mg total) by mouth every 6 (six) hours as needed. Must last 30 days    Dispense:  120 tablet    Refill:  0    Not a duplicate. Do NOT delete! Dispense 1 day early if closed on refill date. Avoid benzodiazepines within 8 hours of opioids. Do not send refill requests.   Orders:  No orders of the defined types were placed in this encounter.  Follow-up plan:   Return in about 3 months (around 02/20/2021) for evaluation day, (F2F), (MM).      Interventional Pending:      Under consideration:   Diagnostic bilateral lumbar facet block #1 Possible bilateral lumbar facet RFA Diagnostic bilateral sacroiliac joint block #1 Possible bilateral sacroiliac joint RFA Possible spinal cord stimulator trial   Palliative care options:   Palliative right lateral epicondyle (elbow) injection #2  Palliative left Racz procedure #5(targeting the left S2, S3 area) (last done 05/02/2020)     Recent Visits Date Type Provider Dept  09/16/20 Telemedicine Milinda Pointer, MD [facility]  08/29/20 Procedure visit Milinda Pointer, MD [facility]  Showing recent visits within past 90 days and meeting all other requirements Today's Visits Date Type Provider Dept  11/18/20 Office Visit Milinda Pointer, MD [facility]  Showing today's visits and meeting all other requirements Future Appointments No visits were found meeting these conditions. Showing future appointments within next 90 days and meeting all other requirements  I discussed the assessment and treatment plan with the patient. The patient was provided an opportunity to ask  questions and all were answered. The patient agreed with the plan and demonstrated an understanding of  the instructions.  Patient advised to call back or seek an in-person evaluation if the symptoms or condition worsens.  Duration of encounter: 30 minutes.  Note by: Gaspar Cola, MD Date: 11/18/2020; Time: 9:06 AM

## 2020-11-18 ENCOUNTER — Ambulatory Visit: Payer: BC Managed Care – PPO | Attending: Pain Medicine | Admitting: Pain Medicine

## 2020-11-18 ENCOUNTER — Other Ambulatory Visit: Payer: Self-pay

## 2020-11-18 ENCOUNTER — Encounter: Payer: Self-pay | Admitting: Pain Medicine

## 2020-11-18 VITALS — BP 125/73 | HR 90 | Temp 98.4°F | Resp 18 | Ht 70.0 in | Wt 170.0 lb

## 2020-11-18 DIAGNOSIS — M961 Postlaminectomy syndrome, not elsewhere classified: Secondary | ICD-10-CM | POA: Diagnosis not present

## 2020-11-18 DIAGNOSIS — F1120 Opioid dependence, uncomplicated: Secondary | ICD-10-CM | POA: Diagnosis present

## 2020-11-18 DIAGNOSIS — R52 Pain, unspecified: Secondary | ICD-10-CM | POA: Diagnosis not present

## 2020-11-18 DIAGNOSIS — M5441 Lumbago with sciatica, right side: Secondary | ICD-10-CM | POA: Diagnosis present

## 2020-11-18 DIAGNOSIS — Z79891 Long term (current) use of opiate analgesic: Secondary | ICD-10-CM | POA: Diagnosis present

## 2020-11-18 DIAGNOSIS — M79605 Pain in left leg: Secondary | ICD-10-CM | POA: Diagnosis present

## 2020-11-18 DIAGNOSIS — M79604 Pain in right leg: Secondary | ICD-10-CM

## 2020-11-18 DIAGNOSIS — Z79899 Other long term (current) drug therapy: Secondary | ICD-10-CM | POA: Diagnosis present

## 2020-11-18 DIAGNOSIS — G894 Chronic pain syndrome: Secondary | ICD-10-CM

## 2020-11-18 DIAGNOSIS — G96198 Other disorders of meninges, not elsewhere classified: Secondary | ICD-10-CM | POA: Diagnosis present

## 2020-11-18 DIAGNOSIS — G8929 Other chronic pain: Secondary | ICD-10-CM

## 2020-11-18 DIAGNOSIS — M5442 Lumbago with sciatica, left side: Secondary | ICD-10-CM | POA: Diagnosis not present

## 2020-11-18 MED ORDER — OXYCODONE HCL 10 MG PO TABS: 10.0000 mg | ORAL_TABLET | Freq: Four times a day (QID) | ORAL | 0 refills | Status: DC | PRN

## 2020-11-18 NOTE — Progress Notes (Signed)
Nursing Pain Medication Assessment:  Safety precautions to be maintained throughout the outpatient stay will include: orient to surroundings, keep bed in low position, maintain call bell within reach at all times, provide assistance with transfer out of bed and ambulation.  Medication Inspection Compliance: Pill count conducted under aseptic conditions, in front of the patient. Neither the pills nor the bottle was removed from the patient's sight at any time. Once count was completed pills were immediately returned to the patient in their original bottle.  Medication: Oxycodone IR Pill/Patch Count: 18 of 120 pills remain Pill/Patch Appearance: Markings consistent with prescribed medication Bottle Appearance: Standard pharmacy container. Clearly labeled. Filled Date: 04 / 20 / 2022 Last Medication intake:  Today

## 2020-11-18 NOTE — Patient Instructions (Signed)
____________________________________________________________________________________________  Drug Holidays (Slow)  What is a "Drug Holiday"? Drug Holiday: is the name given to the period of time during which a patient stops taking a medication(s) for the purpose of eliminating tolerance to the drug.  Benefits . Improved effectiveness of opioids. . Decreased opioid dose needed to achieve benefits. . Improved pain with lesser dose.  What is tolerance? Tolerance: is the progressive decreased in effectiveness of a drug due to its repetitive use. With repetitive use, the body gets use to the medication and as a consequence, it loses its effectiveness. This is a common problem seen with opioid pain medications. As a result, a larger dose of the drug is needed to achieve the same effect that used to be obtained with a smaller dose.  How long should a "Drug Holiday" last? You should stay off of the pain medicine for at least 14 consecutive days. (2 weeks)  Should I stop the medicine "cold turkey"? No. You should always coordinate with your Pain Specialist so that he/she can provide you with the correct medication dose to make the transition as smoothly as possible.  How do I stop the medicine? Slowly. You will be instructed to decrease the daily amount of pills that you take by one (1) pill every seven (7) days. This is called a "slow downward taper" of your dose. For example: if you normally take four (4) pills per day, you will be asked to drop this dose to three (3) pills per day for seven (7) days, then to two (2) pills per day for seven (7) days, then to one (1) per day for seven (7) days, and at the end of those last seven (7) days, this is when the "Drug Holiday" would start.   Will I have withdrawals? By doing a "slow downward taper" like this one, it is unlikely that you will experience any significant withdrawal symptoms. Typically, what triggers withdrawals is the sudden stop of a high  dose opioid therapy. Withdrawals can usually be avoided by slowly decreasing the dose over a prolonged period of time. If you do not follow these instructions and decide to stop your medication abruptly, withdrawals may be possible.  What are withdrawals? Withdrawals: refers to the wide range of symptoms that occur after stopping or dramatically reducing opiate drugs after heavy and prolonged use. Withdrawal symptoms do not occur to patients that use low dose opioids, or those who take the medication sporadically. Contrary to benzodiazepine (example: Valium, Xanax, etc.) or alcohol withdrawals ("Delirium Tremens"), opioid withdrawals are not lethal. Withdrawals are the physical manifestation of the body getting rid of the excess receptors.  Expected Symptoms Early symptoms of withdrawal may include: . Agitation . Anxiety . Muscle aches . Increased tearing . Insomnia . Runny nose . Sweating . Yawning  Late symptoms of withdrawal may include: . Abdominal cramping . Diarrhea . Dilated pupils . Goose bumps . Nausea . Vomiting  Will I experience withdrawals? Due to the slow nature of the taper, it is very unlikely that you will experience any.  What is a slow taper? Taper: refers to the gradual decrease in dose.  (Last update: 01/24/2020) ____________________________________________________________________________________________    ____________________________________________________________________________________________  Medication Recommendations and Reminders  Applies to: All patients receiving prescriptions (written and/or electronic).  Medication Rules & Regulations: These rules and regulations exist for your safety and that of others. They are not flexible and neither are we. Dismissing or ignoring them will be considered "non-compliance" with medication therapy, resulting in complete   and irreversible termination of such therapy. (See document titled "Medication Rules" for  more details.) In all conscience, because of safety reasons, we cannot continue providing a therapy where the patient does not follow instructions.  Pharmacy of record:   Definition: This is the pharmacy where your electronic prescriptions will be sent.   We do not endorse any particular pharmacy, however, we have experienced problems with [facility] not securing enough medication supply for the community.  We do not restrict you in your choice of pharmacy. However, once we write for your prescriptions, we will NOT be re-sending more prescriptions to fix restricted supply problems created by your pharmacy, or your insurance.   The pharmacy listed in the electronic medical record should be the one where you want electronic prescriptions to be sent.  If you choose to change pharmacy, simply notify our nursing staff.  Recommendations:  Keep all of your pain medications in a safe place, under lock and key, even if you live alone. We will NOT replace lost, stolen, or damaged medication.  After you fill your prescription, take 1 week's worth of pills and put them away in a safe place. You should keep a separate, properly labeled bottle for this purpose. The remainder should be kept in the original bottle. Use this as your primary supply, until it runs out. Once it's gone, then you know that you have 1 week's worth of medicine, and it is time to come in for a prescription refill. If you do this correctly, it is unlikely that you will ever run out of medicine.  To make sure that the above recommendation works, it is very important that you make sure your medication refill appointments are scheduled at least 1 week before you run out of medicine. To do this in an effective manner, make sure that you do not leave the office without scheduling your next medication management appointment. Always ask the nursing staff to show you in your prescription , when your medication will be running out. Then arrange for  the receptionist to get you a return appointment, at least 7 days before you run out of medicine. Do not wait until you have 1 or 2 pills left, to come in. This is very poor planning and does not take into consideration that we may need to cancel appointments due to bad weather, sickness, or emergencies affecting our staff.  DO NOT ACCEPT A "Partial Fill": If for any reason your pharmacy does not have enough pills/tablets to completely fill or refill your prescription, do not allow for a "partial fill". The law allows the pharmacy to complete that prescription within 72 hours, without requiring a new prescription. If they do not fill the rest of your prescription within those 72 hours, you will need a separate prescription to fill the remaining amount, which we will NOT provide. If the reason for the partial fill is your insurance, you will need to talk to the pharmacist about payment alternatives for the remaining tablets, but again, DO NOT ACCEPT A PARTIAL FILL, unless you can trust your pharmacist to obtain the remainder of the pills within 72 hours.  Prescription refills and/or changes in medication(s):   Prescription refills, and/or changes in dose or medication, will be conducted only during scheduled medication management appointments. (Applies to both, written and electronic prescriptions.)  No refills on procedure days. No medication will be changed or started on procedure days. No changes, adjustments, and/or refills will be conducted on a procedure day. Doing so   will interfere with the diagnostic portion of the procedure.  No phone refills. No medications will be "called into the pharmacy".  No Fax refills.  No weekend refills.  No Holliday refills.  No after hours refills.  Remember:  Business hours are:  Monday to Thursday 8:00 AM to 4:00 PM Provider's Schedule: Sivan Cuello, MD - Appointments are:  Medication management: Monday and Wednesday 8:00 AM to 4:00 PM Procedure  day: Tuesday and Thursday 7:30 AM to 4:00 PM Bilal Lateef, MD - Appointments are:  Medication management: Tuesday and Thursday 8:00 AM to 4:00 PM Procedure day: Monday and Wednesday 7:30 AM to 4:00 PM (Last update: 01/24/2020) ____________________________________________________________________________________________   ____________________________________________________________________________________________  CBD (cannabidiol) WARNING  Applicable to: All individuals currently taking or considering taking CBD (cannabidiol) and, more important, all patients taking opioid analgesic controlled substances (pain medication). (Example: oxycodone; oxymorphone; hydrocodone; hydromorphone; morphine; methadone; tramadol; tapentadol; fentanyl; buprenorphine; butorphanol; dextromethorphan; meperidine; codeine; etc.)  Legal status: CBD remains a Schedule I drug prohibited for any use. CBD is illegal with one exception. In the [place], CBD has a limited Food and Drug Administration (FDA) approval for the treatment of two specific types of epilepsy disorders. Only one CBD product has been approved by the FDA for this purpose: "Epidiolex". FDA is aware that some companies are marketing products containing cannabis and cannabis-derived compounds in ways that violate the Federal Food, Drug and Cosmetic Act (FD&C Act) and that may put the health and safety of consumers at risk. The FDA, a Federal agency, has not enforced the CBD status since 2018.   Legality: Some manufacturers ship CBD products nationally, which is illegal. Often such products are sold online and are therefore available throughout the country. CBD is openly sold in head shops and health food stores in some states where such sales have not been explicitly legalized. Selling unapproved products with unsubstantiated therapeutic claims is not only a violation of the law, but also can put patients at risk, as these products have not been proven to  be safe or effective. Federal illegality makes it difficult to conduct research on CBD.  Reference: "FDA Regulation of Cannabis and Cannabis-Derived Products, Including Cannabidiol (CBD)" - https://www.fda.gov/news-events/public-health-focus/fda-regulation-cannabis-and-cannabis-derived-products-including-cannabidiol-cbd  Warning: CBD is not FDA approved and has not undergo the same manufacturing controls as prescription drugs.  This means that the purity and safety of available CBD may be questionable. Most of the time, despite manufacturer's claims, it is contaminated with THC (delta-9-tetrahydrocannabinol - the chemical in marijuana responsible for the "HIGH").  When this is the case, the THC contaminant will trigger a positive urine drug screen (UDS) test for Marijuana (carboxy-THC). Because a positive UDS for any illicit substance is a violation of our medication agreement, your opioid analgesics (pain medicine) may be permanently discontinued.  MORE ABOUT CBD  General Information: CBD  is a derivative of the Marijuana (cannabis sativa) plant discovered in 1940. It is one of the 113 identified substances found in Marijuana. It accounts for up to 40% of the plant's extract. As of 2018, preliminary clinical studies on CBD included research for the treatment of anxiety, movement disorders, and pain. CBD is available and consumed in multiple forms, including inhalation of smoke or vapor, as an aerosol spray, and by mouth. It may be supplied as an oil containing CBD, capsules, dried cannabis, or as a liquid solution. CBD is thought not to be as psychoactive as THC (delta-9-tetrahydrocannabinol - the chemical in marijuana responsible for the "HIGH"). Studies suggest that CBD may interact   with different biological target receptors in the body, including cannabinoid and other neurotransmitter receptors. As of 2018 the mechanism of action for its biological effects has not been determined.  Side-effects   Adverse reactions: Dry mouth, diarrhea, decreased appetite, fatigue, drowsiness, malaise, weakness, sleep disturbances, and others.  Drug interactions: CBC may interact with other medications such as blood-thinners. (Last update: 02/10/2020) ____________________________________________________________________________________________   ____________________________________________________________________________________________  Medication Rules  Purpose: To inform patients, and their family members, of our rules and regulations.  Applies to: All patients receiving prescriptions (written or electronic).  Pharmacy of record: Pharmacy where electronic prescriptions will be sent. If written prescriptions are taken to a different pharmacy, please inform the nursing staff. The pharmacy listed in the electronic medical record should be the one where you would like electronic prescriptions to be sent.  Electronic prescriptions: In compliance with the [place] Strengthen Opioid Misuse Prevention (STOP) Act of 2017 (Session Law 2017-74/H243), effective July 06, 2018, all controlled substances must be electronically prescribed. Calling prescriptions to the pharmacy will cease to exist.  Prescription refills: Only during scheduled appointments. Applies to all prescriptions.  NOTE: The following applies primarily to controlled substances (Opioid* Pain Medications).   Type of encounter (visit): For patients receiving controlled substances, face-to-face visits are required. (Not an option or up to the patient.)  Patient's responsibilities: 1. Pain Pills: Bring all pain pills to every appointment (except for procedure appointments). 2. Pill Bottles: Bring pills in original pharmacy bottle. Always bring the newest bottle. Bring bottle, even if empty. 3. Medication refills: You are responsible for knowing and keeping track of what medications you take and those you need refilled. The day before  your appointment: write a list of all prescriptions that need to be refilled. The day of the appointment: give the list to the admitting nurse. Prescriptions will be written only during appointments. No prescriptions will be written on procedure days. If you forget a medication: it will not be "Called in", "Faxed", or "electronically sent". You will need to get another appointment to get these prescribed. No early refills. Do not call asking to have your prescription filled early. 4. Prescription Accuracy: You are responsible for carefully inspecting your prescriptions before leaving our office. Have the discharge nurse carefully go over each prescription with you, before taking them home. Make sure that your name is accurately spelled, that your address is correct. Check the name and dose of your medication to make sure it is accurate. Check the number of pills, and the written instructions to make sure they are clear and accurate. Make sure that you are given enough medication to last until your next medication refill appointment. 5. Taking Medication: Take medication as prescribed. When it comes to controlled substances, taking less pills or less frequently than prescribed is permitted and encouraged. Never take more pills than instructed. Never take medication more frequently than prescribed.  6. Inform other Doctors: Always inform, all of your healthcare providers, of all the medications you take. 7. Pain Medication from other Providers: You are not allowed to accept any additional pain medication from any other Doctor or Healthcare provider. There are two exceptions to this rule. (see below) In the event that you require additional pain medication, you are responsible for notifying us, as stated below. 8. Cough Medicine: Often these contain an opioid, such as codeine or hydrocodone. Never accept or take cough medicine containing these opioids if you are already taking an opioid* medication. The  combination may cause respiratory failure and   death. 9. Medication Agreement: You are responsible for carefully reading and following our Medication Agreement. This must be signed before receiving any prescriptions from our practice. Safely store a copy of your signed Agreement. Violations to the Agreement will result in no further prescriptions. (Additional copies of our Medication Agreement are available upon request.) 10. Laws, Rules, & Regulations: All patients are expected to follow all Federal and State Laws, Statutes, Rules, & Regulations. Ignorance of the Laws does not constitute a valid excuse.  11. Illegal drugs and Controlled Substances: The use of illegal substances (including, but not limited to marijuana and its derivatives) and/or the illegal use of any controlled substances is strictly prohibited. Violation of this rule may result in the immediate and permanent discontinuation of any and all prescriptions being written by our practice. The use of any illegal substances is prohibited. 12. Adopted CDC guidelines & recommendations: Target dosing levels will be at or below 60 MME/day. Use of benzodiazepines** is not recommended.  Exceptions: There are only two exceptions to the rule of not receiving pain medications from other Healthcare Providers. 1. Exception #1 (Emergencies): In the event of an emergency (i.e.: accident requiring emergency care), you are allowed to receive additional pain medication. However, you are responsible for: As soon as you are able, call our office [phone], at any time of the day or night, and leave a message stating your name, the date and nature of the emergency, and the name and dose of the medication prescribed. In the event that your call is answered by a member of our staff, make sure to document and save the date, time, and the name of the person that took your information.  2. Exception #2 (Planned Surgery): In the event that you are scheduled by  another doctor or dentist to have any type of surgery or procedure, you are allowed (for a period no longer than 30 days), to receive additional pain medication, for the acute post-op pain. However, in this case, you are responsible for picking up a copy of our "Post-op Pain Management for Surgeons" handout, and giving it to your surgeon or dentist. This document is available at our office, and does not require an appointment to obtain it. Simply go to our office during business hours (Monday-Thursday from 8:00 AM to 4:00 PM) (Friday 8:00 AM to 12:00 Noon) or if you have a scheduled appointment with us, prior to your surgery, and ask for it by name. In addition, you are responsible for: calling our office [phone], at any time of the day or night, and leaving a message stating your name, name of your surgeon, type of surgery, and date of procedure or surgery. Failure to comply with your responsibilities may result in termination of therapy involving the controlled substances.  *Opioid medications include: morphine, codeine, oxycodone, oxymorphone, hydrocodone, hydromorphone, meperidine, tramadol, tapentadol, buprenorphine, fentanyl, methadone. **Benzodiazepine medications include: diazepam (Valium), alprazolam (Xanax), clonazepam (Klonopine), lorazepam (Ativan), clorazepate (Tranxene), chlordiazepoxide (Librium), estazolam (Prosom), oxazepam (Serax), temazepam (Restoril), triazolam (Halcion) (Last updated: 06/03/2020) ____________________________________________________________________________________________    

## 2020-12-26 ENCOUNTER — Telehealth: Payer: Self-pay | Admitting: Pain Medicine

## 2020-12-26 NOTE — Telephone Encounter (Signed)
Patient called asking if he can get injections in both elbows, they are giving him quite a lot of pain. I did explain Dr. Waynetta Sandy schedule and told him it would be Tues before we could let him know this.

## 2020-12-27 NOTE — Telephone Encounter (Signed)
Dr Laban Emperor has told us that there are no longer prn orders.  He needs to evaluate the patient to determine which procedure is appropriate.  Therefore, please try to at least get the patient on for a virtual visit to discuss the elbow pain so that he can order the procedure.  Thanks.

## 2020-12-31 DIAGNOSIS — G8929 Other chronic pain: Secondary | ICD-10-CM | POA: Insufficient documentation

## 2020-12-31 DIAGNOSIS — M25529 Pain in unspecified elbow: Secondary | ICD-10-CM | POA: Insufficient documentation

## 2020-12-31 NOTE — Progress Notes (Signed)
PROVIDER NOTE: Information contained herein reflects review and annotations entered in association with encounter. Interpretation of such information and data should be left to medically-trained personnel. Information provided to patient can be located elsewhere in the medical record under "Patient Instructions". Document created using STT-dictation technology, any transcriptional errors that may result from process are unintentional.    Patient: Derek [name]  Service Category: E/M  Provider: Gaspar Cola, MD  DOB: Oct 18, 1980  DOS: 01/01/2021  Specialty: Interventional Pain Management  MRN: [id number]  Setting: Ambulatory outpatient  PCP: Jodi Marble, MD  Type: Established Patient    Referring Provider: Jodi Marble, MD  Location: Office  Delivery: Face-to-face     HPI  [name], a 40 y.o. year old male, is here today because of his Chronic pain syndrome [G89.4]. [name] primary complain today is Elbow Pain (bilateral) and Back Pain (Low, and left buttock) Last encounter: My last encounter with him was on 12/26/2020. 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); Somatic dysfunction of sacroiliac joints (Bilateral); Chronic hip pain (Bilateral); Epidural fibrosis; Lumbar postlaminectomy syndrome; DDD (degenerative disc disease), lumbosacral; Lumbar facet syndrome (Bilateral) (L>R); Other specified dorsopathies, sacral and sacrococcygeal region; Spondylosis without myelopathy or radiculopathy, lumbosacral region; Neurogenic pain; Osteoarthritic spondylosis of lumbar spine; Tendonitis  of elbow, right; Lateral epicondylitis of elbow (Right); Chronic elbow pain (Right); Chronic low back pain (Left) w/o sciatica; Numbness of anterior thigh (Right); Burning pain in thigh (Right); Chronic low back pain (Midline) w/o sciatica; Acute exacerbation of chronic low back pain; Chronic elbow pain (Bilateral); and Radial collateral ligament sprain of elbow, unspecified laterality, sequela (Bilateral) on their pertinent problem list. Pain Assessment: Severity of Chronic pain is reported as a 1 /10. Location: Elbow Right, Left/radiates into both hands mostly into pinky fingers. Onset: More than a month ago. Quality: Numbness, Pressure, Sharp. Timing: Constant. Modifying factor(s): denies. Vitals:  height is $RemoveB'5\' 10"'JQRNPAFS$  (1.778 m) and weight is 165 lb (74.8 kg). His temperature is 97.1 F (36.2 C) (abnormal). His blood pressure is 116/75 and his pulse is 91. His respiration is 18 and oxygen saturation is 99%.   Reason for encounter: new problems.  The patient indicates currently having a flareup of his bilateral elbow pain.  He indicates that this is associated with repetitive movement and it tends to be worse at night.  He describes the pain to be in the area of the lateral collateral ligaments, bilaterally, with the right being worse than the left.  Previously we had done injections for him that lasted over 2 months.  He also indicates having used icy hot as well as having tried a 2% steroid cream, which did provide him with some temporary relief of the pain but he did not like it because it was a very "sticky jelly".  He wants to have his elbow injection repeated.  Physical exam today shows the patient to have adequate range of motion bilaterally, but exquisite tenderness to palpation over the lateral collateral ligaments, bilaterally, with the right being worse than the left.  Today I have offered to send him to physical therapy, but he indicated that he has a physical therapist and the family and he  will  consult them for stretching exercises that he can use in the area of the elbows.  Pharmacotherapy Assessment  Analgesic: Oxycodone IR 10 mg, 1 tab PO q 6 hrs (40 mg/day of oxycodone) MME/day: 60 mg/day.   Monitoring: [place] PMP: PDMP reviewed during this encounter.       Pharmacotherapy: No side-effects or adverse reactions reported. Compliance: No problems identified. Effectiveness: Clinically acceptable.  Dewayne Shorter, RN  01/01/2021  9:37 AM  Sign when Signing Visit Safety precautions to be maintained throughout the outpatient stay will include: orient to surroundings, keep bed in low position, maintain call bell within reach at all times, provide assistance with transfer out of bed and ambulation.    UDS:  Summary  Date Value Ref Range Status  02/12/2020 Note  Final    Comment:    ==================================================================== [employer] [facility] (MW) ==================================================================== Test                             Result       Flag       Units  Drug Present and Declared for Prescription Verification   Oxycodone                      2323         EXPECTED   ng/mg creat   Oxymorphone                    7355         EXPECTED   ng/mg creat   Noroxycodone                   2746         EXPECTED   ng/mg creat   Noroxymorphone                 805          EXPECTED   ng/mg creat    Sources of oxycodone are scheduled prescription medications.    Oxymorphone, noroxycodone, and noroxymorphone are expected    metabolites of oxycodone. Oxymorphone is also available as a    scheduled prescription medication.  ==================================================================== Test                      Result    Flag   Units      Ref Range   Creatinine              65               mg/dL      >=20 ==================================================================== Declared Medications:  The flagging and interpretation on this report  are based on the  following declared medications.  Unexpected results may arise from  inaccuracies in the declared medications.   **Note: The testing scope of this panel includes these medications:   Oxycodone   **Note: The testing scope of this panel does not include the  following reported medications:   Calcium  Cyclobenzaprine (Flexeril)  Diclofenac  Magnesium (Mag-Ox)  Meloxicam (Mobic)  Tizanidine (Zanaflex) ==================================================================== For clinical consultation, please call [phone]. ====================================================================      ROS  Constitutional: Denies any fever or chills Gastrointestinal: No reported hemesis, hematochezia, vomiting, or acute GI distress Musculoskeletal: Denies any acute onset joint swelling, redness, loss of ROM, or weakness Neurological: No reported episodes of acute onset apraxia, aphasia, dysarthria, agnosia, amnesia, paralysis, loss of  coordination, or loss of consciousness  Medication Review  Diclofenac Sodium, GNP Calcium 1200, Oxycodone HCl, cyclobenzaprine, meloxicam, and tiZANidine  History Review  Allergy: Derek [name] is allergic to amoxicillin and vicodin [hydrocodone-acetaminophen]. Drug: Derek [name]  reports no history of drug use. Alcohol:  reports current alcohol use. Tobacco:  reports that he has been smoking. He has a 16.00 pack-year smoking history. He has never used smokeless tobacco. Social: Derek [name]  reports that he has been smoking. He has a 16.00 pack-year smoking history. He has never used smokeless tobacco. He reports current alcohol use. He reports that he does not use drugs. Medical:  has a past medical history of Anxiety, Arthritis, and GERD (gastroesophageal reflux disease). Surgical: Derek [name]  has a past surgical history that includes Back surgery; HAND REIMPLANTED; and Lumbar fusion (11/14). Family: family history is not on file.  Laboratory  Chemistry Profile   Renal Lab Results  Component Value Date   BUN 11 01/16/2019   CREATININE 1.08 01/16/2019   BCR 10 01/16/2019   GFRAA 99 01/16/2019   GFRNONAA 86 01/16/2019    Hepatic Lab Results  Component Value Date   AST 24 01/16/2019   ALBUMIN 4.6 01/16/2019   ALKPHOS 108 01/16/2019    Electrolytes Lab Results  Component Value Date   NA 143 01/16/2019   K 4.0 01/16/2019   CL 105 01/16/2019   CALCIUM 10.2 01/16/2019   MG 2.0 01/16/2019    Bone Lab Results  Component Value Date   25OHVITD1 39 01/16/2019   25OHVITD2 1.2 01/16/2019   25OHVITD3 38 01/16/2019    Inflammation (CRP: Acute Phase) (ESR: Chronic Phase) Lab Results  Component Value Date   CRP 4 01/16/2019   ESRSEDRATE 29 (H) 01/16/2019          Note: Above Lab results reviewed.  Recent Imaging Review  DG Lumbar Spine Complete W/Bend CLINICAL DATA:  Low back pain, spinal fusion in 2006 with pain radiating down legs on occasion.  EXAM: LUMBAR SPINE - COMPLETE WITH BENDING VIEWS  COMPARISON:  Nov 30, 2017  FINDINGS: As on the previous study there are changes of lumbosacral spinal fusion and interbody cage placement. No change in alignment alignment. Mild retrolisthesis of L3 on L4 approximately 5 mm seen on both flexion and extension, previously seen on extension but without change over flexion and extension. Hardware with similar appearance.  IMPRESSION: 1. No change in the appearance of spinal alignment, no abnormal motion following L4 through sacroiliac fusion.  Electronically Signed   By: Zetta Bills M.D.   On: 09/19/2020 10:39 Note: Reviewed        Physical Exam  General appearance: Well nourished, well developed, and well hydrated. In no apparent acute distress Mental status: Alert, oriented x 3 (person, place, & time)       Respiratory: No evidence of acute respiratory distress Eyes: PERLA Vitals: BP 116/75   Pulse 91   Temp (!) 97.1 F (36.2 C)   Resp 18   Ht _0   (1.778 m)   Wt 165 lb (74.8 kg)   SpO2 99%   BMI 23.68 kg/m  BMI: Estimated body mass index is 23.68 kg/m as calculated from the following:   Height as of this encounter: _1  (1.778 m).   Weight as of this encounter: 165 lb (74.8 kg). Ideal: Ideal body weight: 73 kg (160 lb 15 oz) Adjusted ideal body weight: 73.7 kg (162 lb 9 oz)  Assessment   Status Diagnosis  Controlled  Controlled Controlled 1. Chronic pain syndrome   2. Chronic elbow pain (Bilateral)   3. Radial collateral ligament sprain of elbow, unspecified laterality, sequela (Bilateral)   4. Tendonitis of elbow, right      Updated Problems: Problem  Radial collateral ligament sprain of elbow, unspecified laterality, sequela (Bilateral)  Chronic elbow pain (Bilateral)    Plan of Care  Problem-specific:  No problem-specific Assessment & Plan notes found for this encounter.  Derek [name] has a current medication list which includes the following long-term medication(s): gnp calcium 1200, cyclobenzaprine, meloxicam, oxycodone hcl, [START ON 01/21/2021] oxycodone hcl, and tizanidine.  Pharmacotherapy (Medications Ordered): No orders of the defined types were placed in this encounter.  Orders:  Orders Placed This Encounter  Procedures   Medium Joint Injection/Arthrocentesis    Level(s): Bilateral elbow steroid injection Laterality: Bilateral Sedation: Patient's choice Purpose: Diagnostic Indication(s): Sub-acute pain    Standing Status:   Future    Standing Expiration Date:   04/03/2021    Scheduling Instructions:     Requested Scheduling Timeframe: As allowed by the schedule   DG ELBOW COMPLETE LEFT (3+VIEW)    Standing Status:   Future    Standing Expiration Date:   01/31/2021    Scheduling Instructions:     Imaging must be done as soon as possible. Inform patient that order will expire within 30 days and I will not renew it.    Order Specific Question:   Reason for Exam (SYMPTOM  OR DIAGNOSIS  REQUIRED)    Answer:   Bilateral elbow pain    Order Specific Question:   Preferred imaging location?    Answer:   [facility]    Order Specific Question:   Call Results- Best Contact Number?    Answer:   [phone] ([facility])    Order Specific Question:   Radiology Contrast Protocol - do NOT remove file path    Answer:   \\[email or web address]    Order Specific Question:   Release to patient    Answer:   Immediate   DG ELBOW COMPLETE RIGHT (3+VIEW)    Standing Status:   Future    Standing Expiration Date:   01/31/2021    Scheduling Instructions:     Imaging must be done as soon as possible. Inform patient that order will expire within 30 days and I will not renew it.    Order Specific Question:   Reason for Exam (SYMPTOM  OR DIAGNOSIS REQUIRED)    Answer:   Bilateral elbow pain    Order Specific Question:   Preferred imaging location?    Answer:   [facility]    Order Specific Question:   Call Results- Best Contact Number?    Answer:   [phone] ([facility])    Order Specific Question:   Radiology Contrast Protocol - do NOT remove file path    Answer:   \\[email or web address]    Order Specific Question:   Release to patient    Answer:   Immediate    Follow-up plan:   Return for Procedure (no sedation): (B) Lateral Collateral Elbow inj. .     Interventional Pending:      Under consideration:   Diagnostic bilateral lumbar facet block #1  Possible bilateral lumbar facet RFA  Diagnostic bilateral sacroiliac joint block #1  Possible bilateral sacroiliac joint RFA  Possible spinal cord stimulator trial    Palliative care options:   Palliative right lateral epicondyle (elbow) injection #2  Palliative left  Racz procedure #5 (targeting the left S2, S3 area) (last done 05/02/2020)      Recent Visits Date Type Provider Dept  11/18/20 Office Visit Milinda Pointer, MD  [facility]  Showing recent visits within past 90 days and meeting all other requirements Today's Visits Date Type Provider Dept  01/01/21 Office Visit Milinda Pointer, MD [facility]  Showing today's visits and meeting all other requirements Future Appointments Date Type Provider Dept  01/21/21 Appointment Milinda Pointer, MD [facility]  02/19/21 Appointment Milinda Pointer, MD [facility]  Showing future appointments within next 90 days and meeting all other requirements I discussed the assessment and treatment plan with the patient. The patient was provided an opportunity to ask questions and all were answered. The patient agreed with the plan and demonstrated an understanding of the instructions.  Patient advised to call back or seek an in-person evaluation if the symptoms or condition worsens.  Duration of encounter: 30 minutes.  Note by: Gaspar Cola, MD Date: 01/01/2021; Time: 10:06 AM

## 2021-01-01 ENCOUNTER — Ambulatory Visit: Payer: BC Managed Care – PPO | Attending: Pain Medicine | Admitting: Pain Medicine

## 2021-01-01 ENCOUNTER — Other Ambulatory Visit: Payer: Self-pay

## 2021-01-01 ENCOUNTER — Encounter: Payer: Self-pay | Admitting: Pain Medicine

## 2021-01-01 VITALS — BP 116/75 | HR 91 | Temp 97.1°F | Resp 18 | Ht 70.0 in | Wt 165.0 lb

## 2021-01-01 DIAGNOSIS — S53439S Radial collateral ligament sprain of unspecified elbow, sequela: Secondary | ICD-10-CM | POA: Diagnosis not present

## 2021-01-01 DIAGNOSIS — G96198 Other disorders of meninges, not elsewhere classified: Secondary | ICD-10-CM | POA: Diagnosis present

## 2021-01-01 DIAGNOSIS — G894 Chronic pain syndrome: Secondary | ICD-10-CM | POA: Insufficient documentation

## 2021-01-01 DIAGNOSIS — G8929 Other chronic pain: Secondary | ICD-10-CM | POA: Diagnosis present

## 2021-01-01 DIAGNOSIS — M25529 Pain in unspecified elbow: Secondary | ICD-10-CM | POA: Insufficient documentation

## 2021-01-01 DIAGNOSIS — M961 Postlaminectomy syndrome, not elsewhere classified: Secondary | ICD-10-CM | POA: Insufficient documentation

## 2021-01-01 DIAGNOSIS — M79604 Pain in right leg: Secondary | ICD-10-CM | POA: Diagnosis present

## 2021-01-01 DIAGNOSIS — M778 Other enthesopathies, not elsewhere classified: Secondary | ICD-10-CM | POA: Insufficient documentation

## 2021-01-01 DIAGNOSIS — M545 Low back pain, unspecified: Secondary | ICD-10-CM | POA: Insufficient documentation

## 2021-01-01 DIAGNOSIS — M79605 Pain in left leg: Secondary | ICD-10-CM | POA: Diagnosis present

## 2021-01-01 NOTE — Patient Instructions (Signed)
____________________________________________________________________________________________  Preparing for your procedure (without sedation)  Procedure appointments are limited to planned procedures: . No Prescription Refills. . No disability issues will be discussed. . No medication changes will be discussed.  Instructions: . Oral Intake: Do not eat or drink anything for at least 6 hours prior to your procedure. (Exception: Blood Pressure Medication. See below.) . Transportation: Unless otherwise stated by your physician, you may drive yourself after the procedure. . Blood Pressure Medicine: Do not forget to take your blood pressure medicine with a sip of water the morning of the procedure. If your Diastolic (lower reading)is above 100 mmHg, elective cases will be cancelled/rescheduled. . Blood thinners: These will need to be stopped for procedures. Notify our staff if you are taking any blood thinners. Depending on which one you take, there will be specific instructions on how and when to stop it. . Diabetics on insulin: Notify the staff so that you can be scheduled 1st case in the morning. If your diabetes requires high dose insulin, take only  of your normal insulin dose the morning of the procedure and notify the staff that you have done so. . Preventing infections: Shower with an antibacterial soap the morning of your procedure.  . Build-up your immune system: Take 1000 mg of Vitamin C with every meal (3 times a day) the day prior to your procedure. . Antibiotics: Inform the staff if you have a condition or reason that requires you to take antibiotics before dental procedures. . Pregnancy: If you are pregnant, call and cancel the procedure. . Sickness: If you have a cold, fever, or any active infections, call and cancel the procedure. . Arrival: You must be in the facility at least 30 minutes prior to your scheduled procedure. . Children: Do not bring any children with you. . Dress  appropriately: Bring dark clothing that you would not mind if they get stained. . Valuables: Do not bring any jewelry or valuables.  Reasons to call and reschedule or cancel your procedure: (Following these recommendations will minimize the risk of a serious complication.) . Surgeries: Avoid having procedures within 2 weeks of any surgery. (Avoid for 2 weeks before or after any surgery). . Flu Shots: Avoid having procedures within 2 weeks of a flu shots or . (Avoid for 2 weeks before or after immunizations). . Barium: Avoid having a procedure within 7-10 days after having had a radiological study involving the use of radiological contrast. (Myelograms, Barium swallow or enema study). . Heart attacks: Avoid any elective procedures or surgeries for the initial 6 months after a "Myocardial Infarction" (Heart Attack). . Blood thinners: It is imperative that you stop these medications before procedures. Let us know if you if you take any blood thinner.  . Infection: Avoid procedures during or within two weeks of an infection (including chest colds or gastrointestinal problems). Symptoms associated with infections include: Localized redness, fever, chills, night sweats or profuse sweating, burning sensation when voiding, cough, congestion, stuffiness, runny nose, sore throat, diarrhea, nausea, vomiting, cold or Flu symptoms, recent or current infections. It is specially important if the infection is over the area that we intend to treat. . Heart and lung problems: Symptoms that may suggest an active cardiopulmonary problem include: cough, chest pain, breathing difficulties or shortness of breath, dizziness, ankle swelling, uncontrolled high or unusually low blood pressure, and/or palpitations. If you are experiencing any of these symptoms, cancel your procedure and contact your primary care physician for an evaluation.  Remember:  Regular   Business hours are:  Monday to Thursday 8:00 AM to 4:00  PM  Provider's Schedule: Makayle Krahn, MD:  Procedure days: Tuesday and Thursday 7:30 AM to 4:00 PM  Bilal Lateef, MD:  Procedure days: Monday and Wednesday 7:30 AM to 4:00 PM ____________________________________________________________________________________________    

## 2021-01-01 NOTE — Progress Notes (Signed)
Safety precautions to be maintained throughout the outpatient stay will include: orient to surroundings, keep bed in low position, maintain call bell within reach at all times, provide assistance with transfer out of bed and ambulation.  

## 2021-01-02 NOTE — Addendum Note (Signed)
Addended by: Valerie Salts on: 01/02/2021 09:50 AM   Modules accepted: Orders

## 2021-01-16 ENCOUNTER — Ambulatory Visit
Admission: RE | Admit: 2021-01-16 | Discharge: 2021-01-16 | Disposition: A | Discharge status: Home / Self Care | Payer: BC Managed Care – PPO | Source: Ambulatory Visit | Attending: Pain Medicine | Admitting: Pain Medicine

## 2021-01-16 ENCOUNTER — Ambulatory Visit
Admission: RE | Admit: 2021-01-16 | Discharge: 2021-01-16 | Disposition: A | Discharge status: Home / Self Care | Payer: BC Managed Care – PPO | Attending: Pain Medicine | Admitting: Pain Medicine

## 2021-01-16 DIAGNOSIS — M25529 Pain in unspecified elbow: Secondary | ICD-10-CM | POA: Diagnosis not present

## 2021-01-16 DIAGNOSIS — M778 Other enthesopathies, not elsewhere classified: Secondary | ICD-10-CM

## 2021-01-16 DIAGNOSIS — S53439S Radial collateral ligament sprain of unspecified elbow, sequela: Secondary | ICD-10-CM | POA: Diagnosis present

## 2021-01-16 DIAGNOSIS — G8929 Other chronic pain: Secondary | ICD-10-CM | POA: Diagnosis present

## 2021-01-20 NOTE — Progress Notes (Signed)
PROVIDER NOTE: Information contained herein reflects review and annotations entered in association with encounter. Interpretation of such information and data should be left to medically-trained personnel. Information provided to patient can be located elsewhere in the medical record under "Patient Instructions". Document created using STT-dictation technology, any transcriptional errors that may result from process are unintentional.    Patient: Derek [name]  Service Category: Procedure  Provider: Oswaldo DoneFrancisco A Emmani Lesueur, MD  DOB: 05/16/1981  DOS: 01/21/2021  Location: [facility]  MRN: [id number]  Setting: Ambulatory - outpatient  Referring Provider: Sherron Mondayejan-Sie, S Ahmed, MD  Type: Established Patient  Specialty: Interventional Pain Management  PCP: Sherron Mondayejan-Sie, S Ahmed, MD   Primary Reason for Visit: Interventional Pain Management Treatment. CC: Elbow Pain (bilateral)  Procedure:          Anesthesia, Analgesia, Anxiolysis:  Type: Therapeutic elbow Lateral Epicondyle Steroid Injection           Region: Lateral Elbow Area Level: Elbow Laterality: Bilateral  Type: Local Anesthesia Indication(s): Analgesia         Local Anesthetic: Lidocaine 1-2% Route: Infiltration ([place]/IM) IV Access: Declined Sedation: Declined   Position: Sitting   Indications: 1. Chronic elbow pain (Bilateral)   2. Enthesopathy of elbow region (Bilateral)   3. Sprain of lateral collateral ligament of elbow, sequela (Left)   4. Sprain of lateral collateral ligament of elbow, sequela (Right)   5. Lateral epicondylitis of elbows (Bilateral)   6. Tendinitis of elbows (Bilateral)    Pain Score: Pre-procedure: 7 /10 Post-procedure: 4 /10   Pre-op H&P Assessment:  [name] is a 40 y.o. (year old), male patient, seen today for interventional treatment. He  has a past surgical history that includes Back surgery; HAND REIMPLANTED; and Lumbar fusion (11/14). [name] has a current medication list which includes  the following prescription(s): diclofenac sodium, oxycodone hcl, oxycodone hcl, gnp calcium 1200, cyclobenzaprine, meloxicam, and tizanidine. His primarily concern today is the Elbow Pain (bilateral)  Initial Vital Signs:  Pulse/HCG Rate: (!) 102  Temp: (!) 97.4 F (36.3 C) Resp: 16 BP: 126/81 SpO2: 100 %  BMI: Estimated body mass index is 24.11 kg/m as calculated from the following:   Height as of this encounter: 5\' 10"  (1.778 m).   Weight as of this encounter: 168 lb (76.2 kg).  Risk Assessment: Allergies: Reviewed. He is allergic to amoxicillin and vicodin [hydrocodone-acetaminophen].  Allergy Precautions: None required Coagulopathies: Reviewed. None identified.  Blood-thinner therapy: None at this time Active Infection(s): Reviewed. None identified. [name] is afebrile  Site Confirmation: [name] was asked to confirm the procedure and laterality before marking the site Procedure checklist: Completed Consent: Before the procedure and under the influence of no sedative(s), amnesic(s), or anxiolytics, the patient was informed of the treatment options, risks and possible complications. To fulfill our ethical and legal obligations, as recommended by the American Medical Association's Code of Ethics, I have informed the patient of my clinical impression; the nature and purpose of the treatment or procedure; the risks, benefits, and possible complications of the intervention; the alternatives, including doing nothing; the risk(s) and benefit(s) of the alternative treatment(s) or procedure(s); and the risk(s) and benefit(s) of doing nothing. The patient was provided information about the general risks and possible complications associated with the procedure. These may include, but are not limited to: failure to achieve desired goals, infection, bleeding, organ or nerve damage, allergic reactions, paralysis, and death. In addition, the patient was informed of those risks and complications  associated to the procedure, such as failure to decrease pain; infection; bleeding; organ or nerve damage with subsequent damage to sensory, motor, and/or autonomic systems, resulting in permanent pain, numbness, and/or weakness of one or several areas of the body; allergic reactions; (i.e.: anaphylactic reaction); and/or death. Furthermore, the patient was informed of those risks and complications associated with the medications. These include, but are not limited to: allergic reactions (i.e.: anaphylactic or anaphylactoid reaction(s)); adrenal axis suppression; blood sugar elevation that in diabetics may result in ketoacidosis or comma; water retention that in patients with history of congestive heart failure may result in shortness of breath, pulmonary edema, and decompensation with resultant heart failure; weight gain; swelling or edema; medication-induced neural toxicity; particulate matter embolism and blood vessel occlusion with resultant organ, and/or nervous system infarction; and/or aseptic necrosis of one or more joints. Finally, the patient was informed that Medicine is not an exact science; therefore, there is also the possibility of unforeseen or unpredictable risks and/or possible complications that may result in a catastrophic outcome. The patient indicated having understood very clearly. We have given the patient no guarantees and we have made no promises. Enough time was given to the patient to ask questions, all of which were answered to the patient's satisfaction. Mr. Straus has indicated that he wanted to continue with the procedure. Attestation: I, the ordering provider, attest that I have discussed with the patient the benefits, risks, side-effects, alternatives, likelihood of achieving goals, and potential problems during recovery for the procedure that I have provided informed consent. Date  Time: 01/21/2021  8:24 AM  Pre-Procedure Preparation:  Monitoring: As per clinic protocol.  Respiration, ETCO2, SpO2, BP, heart rate and rhythm monitor placed and checked for adequate function Safety Precautions: Patient was assessed for positional comfort and pressure points before starting the procedure. Time-out: I initiated and conducted the "Time-out" before starting the procedure, as per protocol. The patient was asked to participate by confirming the accuracy of the "Time Out" information. Verification of the correct person, site, and procedure were performed and confirmed by me, the nursing staff, and the patient. "Time-out" conducted as per Joint Commission's Universal Protocol (UP.01.01.01). Time: 0901  Description of Procedure:          Target Area: Lateral epicondyle Approach: Lateral approach. Area Prepped: Entire elbow Region DuraPrep (Iodine Povacrylex [0.7% available iodine] and Isopropyl Alcohol, 74% w/w) Safety Precautions: Aspiration looking for blood return was conducted prior to all injections. At no point did we inject any substances, as a needle was being advanced. No attempts were made at seeking any paresthesias. Safe injection practices and needle disposal techniques used. Medications properly checked for expiration dates. SDV (single dose vial) medications used. Description of the Procedure: Protocol guidelines were followed. The patient was placed in position. The target area was identified and the area prepped in the usual manner. Skin & deeper tissues infiltrated with local anesthetic. Appropriate amount of time allowed to pass for local anesthetics to take effect. The procedure needles were then advanced to the target area. Proper needle placement secured. Negative aspiration confirmed. Solution injected in intermittent fashion, asking for systemic symptoms every 0.5cc of injectate. The needles were then removed and the area cleansed, making sure to leave some of the prepping solution back to take advantage of its long term bactericidal properties. Vitals:    01/21/21 0824  BP: 126/81  Pulse: (!) 102  Resp: 16  Temp: (!) 97.4 F (36.3 C)  SpO2: 100%  Weight: 168 lb (76.2  kg)  Height: 5\' 10"  (1.778 m)    Start Time: 0901 hrs. End Time: 0906 hrs. Materials:  Needle(s) Type: Spinal Needle Gauge: 22G Length: 3.5-in Medication(s): Please see orders for medications and dosing details.                 Imaging Guidance:          Type of Imaging Technique: None used Indication(s): N/A Exposure Time: No patient exposure Contrast: None used. Fluoroscopic Guidance: N/A Ultrasound Guidance: N/A Interpretation: N/A  Antibiotic Prophylaxis:   Anti-infectives (From admission, onward)    None      Indication(s): None identified  Post-operative Assessment:  Post-procedure Vital Signs:  Pulse/HCG Rate: (!) 102  Temp: (!) 97.4 F (36.3 C) Resp: 16 BP: 126/81 SpO2: 100 %  EBL: None  Complications: No immediate post-treatment complications observed by team, or reported by patient.  Note: The patient tolerated the entire procedure well. A repeat set of vitals were taken after the procedure and the patient was kept under observation following institutional policy, for this type of procedure. Post-procedural neurological assessment was performed, showing return to baseline, prior to discharge. The patient was provided with post-procedure discharge instructions, including a section on how to identify potential problems. Should any problems arise concerning this procedure, the patient was given instructions to immediately contact , at any time, without hesitation. In any case, we plan to contact the patient by telephone for a follow-up status report regarding this interventional procedure.  Comments:  No additional relevant information.  Plan of Care  Orders:  Orders Placed This Encounter  Procedures   Medium Joint Injection/Arthrocentesis    Level(s): Bilateral elbow lateral collateral ligament/epicondyles steroid  injection Laterality: Bilateral Sedation: No Sedation Purpose: Therapeutic Indication(s): Sub-acute pain    Standing Status:   Future    Standing Expiration Date:   04/23/2021    Scheduling Instructions:     Requested Scheduling Timeframe: Today   Informed Consent Details: Physician/Practitioner Attestation; Transcribe to consent form and obtain patient signature    Nursing Order: Transcribe to consent form and obtain patient signature. Note: Always confirm laterality of pain with Mr. Tuccillo, before procedure.    Order Specific Question:   Physician/Practitioner attestation of informed consent for procedure/surgical case    Answer:   I, the physician/practitioner, attest that I have discussed with the patient the benefits, risks, side effects, alternatives, likelihood of achieving goals and potential problems during recovery for the procedure that I have provided informed consent.    Order Specific Question:   Procedure    Answer:   Bilateral elbow lateral collateral ligament steroid injection    Order Specific Question:   Physician/Practitioner performing the procedure    Answer:   Brenn Deziel A. Derek Bene, MD    Order Specific Question:   Indication/Reason    Answer:   Bilateral elbow pain, epicondylitis, enthesopathy   Provide equipment / supplies at bedside    "Block Tray" (Disposable  single use) Needle type: SpinalRegular Amount/quantity: 2 Size: Short(1.5-inch) Gauge: 25G    Standing Status:   Standing    Number of Occurrences:   1    Order Specific Question:   Specify    Answer:   Block Tray    Chronic Opioid Analgesic:  Oxycodone IR 10 mg, 1 tab PO q 6 hrs (40 mg/day of oxycodone) MME/day: 60 mg/day.   Medications ordered for procedure: Meds ordered this encounter  Medications   lidocaine (XYLOCAINE) 2 % (with pres) injection 400 mg  methylPREDNISolone acetate (DEPO-MEDROL) injection 80 mg   methylPREDNISolone acetate (DEPO-MEDROL) injection 80 mg   ropivacaine (PF) 2  mg/mL (0.2%) (NAROPIN) injection 4 mL   ropivacaine (PF) 2 mg/mL (0.2%) (NAROPIN) injection 4 mL    Medications administered: We administered lidocaine, methylPREDNISolone acetate, methylPREDNISolone acetate, ropivacaine (PF) 2 mg/mL (0.2%), and ropivacaine (PF) 2 mg/mL (0.2%).  See the medical record for exact dosing, route, and time of administration.  Follow-up plan:   Return in about 2 weeks (around 02/04/2021) for procedure day (afternoon VV) (PPE).      Interventional Pending:      Under consideration:   Diagnostic bilateral lumbar facet block #1  Possible bilateral lumbar facet RFA  Diagnostic bilateral sacroiliac joint block #1  Possible bilateral sacroiliac joint RFA  Possible spinal cord stimulator trial    Palliative care options:   Palliative right lateral epicondyle (elbow) injection #2  Palliative left Racz procedure #5 (targeting the left S2, S3 area) (last done 05/02/2020)       Recent Visits Date Type Provider Dept  01/01/21 Office Visit Delano Metz, MD [facility]  11/18/20 Office Visit Delano Metz, MD [facility]  Showing recent visits within past 90 days and meeting all other requirements Today's Visits Date Type Provider Dept  01/21/21 Procedure visit Delano Metz, MD [facility]  Showing today's visits and meeting all other requirements Future Appointments Date Type Provider Dept  02/04/21 Appointment Delano Metz, MD [facility]  02/19/21 Appointment Delano Metz, MD [facility]  Showing future appointments within next 90 days and meeting all other requirements Disposition: Discharge home  Discharge (Date  Time): 01/21/2021; 0907 hrs.   Primary Care Physician: Sherron Monday, MD Location: [facility] Note by: Oswaldo Done, MD Date: 01/21/2021; Time: 12:26 PM  Disclaimer:  Medicine is not an Visual merchandiser. The only guarantee in  medicine is that nothing is guaranteed. It is important to note that the decision to proceed with this intervention was based on the information collected from the patient. The Data and conclusions were drawn from the patient's questionnaire, the interview, and the physical examination. Because the information was provided in large part by the patient, it cannot be guaranteed that it has not been purposely or unconsciously manipulated. Every effort has been made to obtain as much relevant data as possible for this evaluation. It is important to note that the conclusions that lead to this procedure are derived in large part from the available data. Always take into account that the treatment will also be dependent on availability of resources and existing treatment guidelines, considered by other Pain Management Practitioners as being common knowledge and practice, at the time of the intervention. For Medico-Legal purposes, it is also important to point out that variation in procedural techniques and pharmacological choices are the acceptable norm. The indications, contraindications, technique, and results of the above procedure should only be interpreted and judged by a Board-Certified Interventional Pain Specialist with extensive familiarity and expertise in the same exact procedure and technique.

## 2021-01-21 ENCOUNTER — Other Ambulatory Visit: Payer: Self-pay

## 2021-01-21 ENCOUNTER — Ambulatory Visit: Payer: BC Managed Care – PPO | Attending: Pain Medicine | Admitting: Pain Medicine

## 2021-01-21 ENCOUNTER — Encounter: Payer: Self-pay | Admitting: Pain Medicine

## 2021-01-21 VITALS — BP 126/81 | HR 102 | Temp 97.4°F | Resp 16 | Ht 70.0 in | Wt 168.0 lb

## 2021-01-21 DIAGNOSIS — S53432S Radial collateral ligament sprain of left elbow, sequela: Secondary | ICD-10-CM | POA: Diagnosis present

## 2021-01-21 DIAGNOSIS — S53431S Radial collateral ligament sprain of right elbow, sequela: Secondary | ICD-10-CM | POA: Insufficient documentation

## 2021-01-21 DIAGNOSIS — G8929 Other chronic pain: Secondary | ICD-10-CM | POA: Diagnosis present

## 2021-01-21 DIAGNOSIS — M7711 Lateral epicondylitis, right elbow: Secondary | ICD-10-CM | POA: Diagnosis present

## 2021-01-21 DIAGNOSIS — M7712 Lateral epicondylitis, left elbow: Secondary | ICD-10-CM | POA: Diagnosis present

## 2021-01-21 DIAGNOSIS — M778 Other enthesopathies, not elsewhere classified: Secondary | ICD-10-CM | POA: Insufficient documentation

## 2021-01-21 DIAGNOSIS — M25529 Pain in unspecified elbow: Secondary | ICD-10-CM | POA: Insufficient documentation

## 2021-01-21 DIAGNOSIS — S53439S Radial collateral ligament sprain of unspecified elbow, sequela: Secondary | ICD-10-CM

## 2021-01-21 MED ORDER — METHYLPREDNISOLONE ACETATE 80 MG/ML IJ SUSP
80.0000 mg | Freq: Once | INTRAMUSCULAR | Status: AC
  Administered 2021-01-21: 80 mg

## 2021-01-21 MED ORDER — ROPIVACAINE HCL 2 MG/ML IJ SOLN
4.0000 mL | Freq: Once | INTRAMUSCULAR | Status: AC
  Administered 2021-01-21: 4 mL via INTRA_ARTICULAR

## 2021-01-21 MED ORDER — METHYLPREDNISOLONE ACETATE 80 MG/ML IJ SUSP
80.0000 mg | Freq: Once | INTRAMUSCULAR | Status: AC
Start: 2021-01-21 — End: 2021-01-21
  Administered 2021-01-21: 80 mg via INTRAMUSCULAR

## 2021-01-21 MED ORDER — LIDOCAINE HCL 2 % IJ SOLN
20.0000 mL | Freq: Once | INTRAMUSCULAR | Status: AC
  Administered 2021-01-21: 200 mg

## 2021-01-21 NOTE — Patient Instructions (Signed)

## 2021-01-21 NOTE — Progress Notes (Signed)
Safety precautions to be maintained throughout the outpatient stay will include: orient to surroundings, keep bed in low position, maintain call bell within reach at all times, provide assistance with transfer out of bed and ambulation.  

## 2021-01-22 ENCOUNTER — Telehealth: Payer: Self-pay

## 2021-01-22 NOTE — Telephone Encounter (Signed)
Post procedure phone call.  Patient states he is doing good.  

## 2021-02-03 NOTE — Progress Notes (Signed)
Patient: Derek [name]  Service Category: E/M  Provider: Gaspar Cola, MD  DOB: [birth year]  DOS: 02/04/2021  Location: Office  MRN: [id number]  Setting: Ambulatory outpatient  Referring Provider: Jodi Marble, MD  Type: Established Patient  Specialty: Interventional Pain Management  PCP: Jodi Marble, MD  Location: Remote location  Delivery: TeleHealth     Virtual Encounter - Pain Management PROVIDER NOTE: Information contained herein reflects review and annotations entered in association with encounter. Interpretation of such information and data should be left to medically-trained personnel. Information provided to patient can be located elsewhere in the medical record under "Patient Instructions". Document created using STT-dictation technology, any transcriptional errors that may result from process are unintentional.    Contact & Pharmacy Preferred: [phone] Home: [phone] (home) Mobile: [phone] (mobile) E-mail: [email or web address]  [facility] - [place], [place] [address] [place] [address] [place] Phone: [phone] Fax: [phone]   Pre-screening  Derek [name] offered "in-person" vs "virtual" encounter. He indicated preferring virtual for this encounter.   Reason COVID-19*  Social distancing based on CDC and AMA recommendations.   I contacted Derek [name] on 02/04/2021 via telephone.      I clearly identified myself as 21/08/2020, MD. I verified that I was speaking with the correct person using two identifiers (Name: Derek [name], and date of birth: 1980-08-26).  Consent I sought verbal advanced consent from 21/03/1981 for virtual visit interactions. I informed Derek [name] of possible security and privacy concerns, risks, and limitations associated with providing "not-in-person" medical evaluation and management services. I also informed Derek [name] of the availability of "in-person" appointments. Finally, I  informed him that there would be a charge for the virtual visit and that he could be  personally, fully or partially, financially responsible for it. Derek [name] expressed understanding and agreed to proceed.   Historic Elements   Derek [name] is a 40 y.o. year old, male patient evaluated today after our last contact on 01/21/2021. Derek [name]  has a past medical history of Anxiety, Arthritis, and GERD (gastroesophageal reflux disease). He also  has a past surgical history that includes Back surgery; HAND REIMPLANTED; and Lumbar fusion (11/14). Derek [name] has a current medication list which includes the following prescription(s): gnp calcium 1200, cyclobenzaprine, diclofenac sodium, meloxicam, oxycodone hcl, tizanidine, and oxycodone hcl. He  reports that he has been smoking cigarettes. He has a 16.00 pack-year smoking history. He has never used smokeless tobacco. He reports current alcohol use. He reports that he does not use drugs. Derek [name] is allergic to amoxicillin and vicodin [hydrocodone-acetaminophen].   HPI  Today, he is being contacted for a post-procedure assessment.  The patient indicates that on the first day it was uncomfortable, but by the time he woke up on the next day, his pain was a lot better and continued to improve to the point where he is currently in joint a 95% improvement in his elbow pain.  He is scheduled to return on 02/19/2021 for his face-to-face medication management appointment.  At that time, we will need to update his UDS.  The patient indicates doing well with the current medication regimen. No adverse reactions or side effects reported to the medications.   Post-Procedure Evaluation  Procedure (01/21/2021):  Type: Therapeutic elbow Lateral Epicondyle Steroid Injection           Region: Lateral Elbow Area Level: Elbow Laterality: Bilateral  Pre-procedure  pain level: 7/10 Post-procedure: 4/10 (> 50% relief)  Anxiolysis: none.  Effectiveness during initial hour  after procedure (Ultra-Short Term Relief): 0 %.  Local anesthetic used: Long-acting (4-6 hours) Effectiveness: Defined as any analgesic benefit obtained secondary to the administration of local anesthetics. This carries significant diagnostic value as to the etiological location, or anatomical origin, of the pain. Duration of benefit is expected to coincide with the duration of the local anesthetic used.  Effectiveness during initial 4-6 hours after procedure (Short-Term Relief): 0 %.  Long-term benefit: Defined as any relief past the pharmacologic duration of the local anesthetics.  Effectiveness past the initial 6 hours after procedure (Long-Term Relief): 95 % (ongoing).  Benefits, current: Defined as benefit present at the time of this evaluation.   Analgesia:   The patient indicates having attained an ongoing 95% relief of the elbow pain, bilaterally. Function: Derek [name] reports improvement in function ROM: Derek [name] reports improvement in ROM  Pharmacotherapy Assessment   Analgesic: Oxycodone IR 10 mg, 1 tab PO q 6 hrs (40 mg/day of oxycodone) MME/day: 60 mg/day.   Monitoring: [place] PMP: PDMP reviewed during this encounter.       Pharmacotherapy: No side-effects or adverse reactions reported. Compliance: No problems identified. Effectiveness: Clinically acceptable. Plan: Refer to "POC". UDS:  Summary  Date Value Ref Range Status  02/12/2020 Note  Final    Comment:    ==================================================================== [employer] Select 13 (MW) ==================================================================== Test                             Result       Flag       Units  Drug Present and Declared for Prescription Verification   Oxycodone                      2323         EXPECTED   ng/mg creat   Oxymorphone                    7355         EXPECTED   ng/mg creat   Noroxycodone                   2746         EXPECTED   ng/mg creat   Noroxymorphone                  805          EXPECTED   ng/mg creat    Sources of oxycodone are scheduled prescription medications.    Oxymorphone, noroxycodone, and noroxymorphone are expected    metabolites of oxycodone. Oxymorphone is also available as a    scheduled prescription medication.  ==================================================================== Test                      Result    Flag   Units      Ref Range   Creatinine              65               mg/dL      >=20 ==================================================================== Declared Medications:  The flagging and interpretation on this report are based on the  following declared medications.  Unexpected results may arise from  inaccuracies in the declared medications.   **Note: The testing scope of this panel includes these medications:  Oxycodone   **Note: The testing scope of this panel does not include the  following reported medications:   Calcium  Cyclobenzaprine (Flexeril)  Diclofenac  Magnesium (Mag-Ox)  Meloxicam (Mobic)  Tizanidine (Zanaflex) ==================================================================== For clinical consultation, please call [phone]. ====================================================================      Laboratory Chemistry Profile   Renal Lab Results  Component Value Date   BUN 11 01/16/2019   CREATININE 1.08 01/16/2019   BCR 10 01/16/2019   GFRAA 99 01/16/2019   GFRNONAA 86 01/16/2019    Hepatic Lab Results  Component Value Date   AST 24 01/16/2019   ALBUMIN 4.6 01/16/2019   ALKPHOS 108 01/16/2019    Electrolytes Lab Results  Component Value Date   NA 143 01/16/2019   K 4.0 01/16/2019   CL 105 01/16/2019   CALCIUM 10.2 01/16/2019   MG 2.0 01/16/2019    Bone Lab Results  Component Value Date   25OHVITD1 39 01/16/2019   25OHVITD2 1.2 01/16/2019   25OHVITD3 38 01/16/2019    Inflammation (CRP: Acute Phase) (ESR: Chronic Phase) Lab Results  Component Value Date    CRP 4 01/16/2019   ESRSEDRATE 29 (H) 01/16/2019         Note: Above Lab results reviewed.  Imaging  DG ELBOW COMPLETE RIGHT (3+VIEW) CLINICAL DATA:  Bilateral elbow pain.  No known injury.  EXAM: RIGHT ELBOW - COMPLETE 3+ VIEW  COMPARISON:  None.  FINDINGS: Normal joint spaces and alignment. There is no evidence of fracture, dislocation, or joint effusion. Tiny olecranon spur. There is no evidence of arthropathy or other focal bone abnormality. Soft tissues are unremarkable.  IMPRESSION: Tiny olecranon spur. Otherwise unremarkable radiographs of the right elbow.  Electronically Signed   By: Keith Rake M.D.   On: 01/19/2021 15:05 DG ELBOW COMPLETE LEFT (3+VIEW) CLINICAL DATA:  Bilateral elbow pain.  No known injury.  EXAM: LEFT ELBOW - COMPLETE 3+ VIEW  COMPARISON:  None.  FINDINGS: Normal joint spaces and alignment. There is no evidence of fracture, dislocation, or joint effusion. There is no evidence of arthropathy or other focal bone abnormality. Soft tissues are unremarkable.  IMPRESSION: Negative radiographs of the left elbow.  Electronically Signed   By: Keith Rake M.D.   On: 01/19/2021 15:03  Assessment  There were no encounter diagnoses.  Plan of Care  Problem-specific:  No problem-specific Assessment & Plan notes found for this encounter.  Derek [name] has a current medication list which includes the following long-term medication(s): gnp calcium 1200, cyclobenzaprine, meloxicam, oxycodone hcl, tizanidine, and oxycodone hcl.  Pharmacotherapy (Medications Ordered): No orders of the defined types were placed in this encounter.  Orders:  No orders of the defined types were placed in this encounter.  Follow-up plan:   No follow-ups on file.     Interventional Pending:      Under consideration:   Diagnostic bilateral lumbar facet block #1  Possible bilateral lumbar facet RFA  Diagnostic bilateral sacroiliac joint block  #1  Possible bilateral sacroiliac joint RFA  Possible spinal cord stimulator trial    Palliative care options:   Palliative right lateral epicondyle (elbow) injection #2  Palliative left Racz procedure #5 (targeting the left S2, S3 area) (last done 05/02/2020)        Recent Visits Date Type Provider Dept  01/21/21 Procedure visit Milinda Pointer, MD [facility]  01/01/21 Office Visit Milinda Pointer, MD [facility]  11/18/20 Office Visit Milinda Pointer, MD [facility]  Showing recent visits  within past 90 days and meeting all other requirements Future Appointments Date Type Provider Dept  02/04/21 Telemedicine Milinda Pointer, MD [facility]  02/19/21 Appointment Milinda Pointer, MD [facility]  Showing future appointments within next 90 days and meeting all other requirements I discussed the assessment and treatment plan with the patient. The patient was provided an opportunity to ask questions and all were answered. The patient agreed with the plan and demonstrated an understanding of the instructions.  Patient advised to call back or seek an in-person evaluation if the symptoms or condition worsens.  Duration of encounter: 12 minutes.  Note by: Gaspar Cola, MD Date: 02/04/2021; Time: 12:17 PM

## 2021-02-04 ENCOUNTER — Other Ambulatory Visit: Payer: Self-pay

## 2021-02-04 ENCOUNTER — Ambulatory Visit: Payer: BC Managed Care – PPO | Attending: Pain Medicine | Admitting: Pain Medicine

## 2021-02-04 DIAGNOSIS — S53431S Radial collateral ligament sprain of right elbow, sequela: Secondary | ICD-10-CM | POA: Diagnosis not present

## 2021-02-04 DIAGNOSIS — M25529 Pain in unspecified elbow: Secondary | ICD-10-CM | POA: Diagnosis not present

## 2021-02-04 DIAGNOSIS — S53432S Radial collateral ligament sprain of left elbow, sequela: Secondary | ICD-10-CM | POA: Diagnosis not present

## 2021-02-04 DIAGNOSIS — M778 Other enthesopathies, not elsewhere classified: Secondary | ICD-10-CM

## 2021-02-04 DIAGNOSIS — M7712 Lateral epicondylitis, left elbow: Secondary | ICD-10-CM

## 2021-02-04 DIAGNOSIS — G8929 Other chronic pain: Secondary | ICD-10-CM

## 2021-02-04 DIAGNOSIS — G894 Chronic pain syndrome: Secondary | ICD-10-CM

## 2021-02-04 DIAGNOSIS — M7711 Lateral epicondylitis, right elbow: Secondary | ICD-10-CM

## 2021-02-16 NOTE — Progress Notes (Signed)
PROVIDER NOTE: Information contained herein reflects review and annotations entered in association with encounter. Interpretation of such information and data should be left to medically-trained personnel. Information provided to patient can be located elsewhere in the medical record under "Patient Instructions". Document created using STT-dictation technology, any transcriptional errors that may result from process are unintentional.    Patient: Derek [name]  Service Category: E/M  Provider: Gaspar Cola, MD  DOB: [birth year]  DOS: 02/19/2021  Specialty: Interventional Pain Management  MRN: [id number]  Setting: Ambulatory outpatient  PCP: Jodi Marble, MD  Type: Established Patient    Referring Provider: Jodi Marble, MD  Location: Office  Delivery: Face-to-face     HPI  [name], a 40 y.o. year old male, is here today because of his Chronic pain syndrome [G89.4]. [name] primary complain today is Back Pain (low) Last encounter: My last encounter with him was on 01/21/2021. Pertinent problems: [name] has Lumbar pseudoarthrosis (L5-S1); Chronic low back pain (1ry area of Pain) (Bilateral) (L>R) w/ sciatica (Bilateral); Chronic lower extremity pain (2ry area of Pain) (Bilateral) (L>R); Chronic pain syndrome; Failed back surgical syndrome (x3); L5-S1 pseudoarthrosis; Chronic musculoskeletal pain; Spasm of back muscles; Sacroiliac joint dysfunction (Bilateral); Chronic sacroiliac joint pain (Bilateral) (L>R); Somatic dysfunction of sacroiliac joints (Bilateral); Chronic hip pain (Bilateral); Epidural fibrosis; Lumbar postlaminectomy syndrome; DDD (degenerative disc disease), lumbosacral; Lumbar facet syndrome (Bilateral) (L>R); Other specified dorsopathies, sacral and sacrococcygeal region; Spondylosis without myelopathy or radiculopathy, lumbosacral region; Neurogenic pain; Osteoarthritic spondylosis of lumbar spine; Tendinitis of elbows (Bilateral); Lateral epicondylitis  of elbows (Bilateral); Chronic elbow pain (Right); Chronic low back pain (Left) w/o sciatica; Numbness of anterior thigh (Right); Burning pain in thigh (Right); Chronic low back pain (Midline) w/o sciatica; Acute exacerbation of chronic low back pain; Chronic elbow pain (Bilateral); Radial collateral ligament sprain of elbow, unspecified laterality, sequela (Bilateral); Enthesopathy of elbow region (Bilateral); Sprain of lateral collateral ligament of elbow, sequela (Left); and Sprain of lateral collateral ligament of elbow, sequela (Right) on their pertinent problem list. Pain Assessment: Severity of Chronic pain is reported as a 3 /10. Location: Back Lower, Mid/sometimes goes down left in the back to the ankle. Onset: More than a month ago. Quality: Aching, Sharp. Timing: Constant. Modifying factor(s): denies. Vitals:  height is _0  (1.778 m) and weight is 174 lb (78.9 kg). His temperature is 97 F (36.1 C) (abnormal). His blood pressure is 149/82 (abnormal) and his pulse is 108 (abnormal). His respiration is 16 and oxygen saturation is 100%.   Reason for encounter: medication management.   The patient indicates doing well with the current medication regimen. No adverse reactions or side effects reported to the medications.   Today the patient indicated that he is still enjoying an ongoing 100% relief of the pain from the therapeutic lateral elbow epicondyle steroid injections that he had done on 01/21/2021.  RTCB: 05/21/2021 Nonopioids transferred 05/02/2020: Zanaflex, Flexeril, and Mobic  Pharmacotherapy Assessment  Analgesic: Oxycodone IR 10 mg, 1 tab PO q 6 hrs (40 mg/day of oxycodone) MME/day: 60 mg/day.   Monitoring: [place] PMP: PDMP reviewed during this encounter.       Pharmacotherapy: No side-effects or adverse reactions reported. Compliance: No problems identified. Effectiveness: Clinically acceptable.  Dewayne Shorter, RN  02/19/2021  8:25 AM  Sign when Signing Visit Nursing Pain  Medication Assessment:  Safety precautions to be maintained throughout the outpatient stay will include: orient to surroundings, keep bed in low position, maintain  call bell within reach at all times, provide assistance with transfer out of bed and ambulation.  Medication Inspection Compliance: Pill count conducted under aseptic conditions, in front of the patient. Neither the pills nor the bottle was removed from the patient's sight at any time. Once count was completed pills were immediately returned to the patient in their original bottle.  Medication: Oxycodone IR Pill/Patch Count:  2 of 120 pills remain Pill/Patch Appearance: Markings consistent with prescribed medication Bottle Appearance: Standard pharmacy container. Clearly labeled. Filled Date: 07 / 19 / 2022 Last Medication intake:  Today    UDS:  Summary  Date Value Ref Range Status  02/12/2020 Note  Final    Comment:    ==================================================================== [employer] Select 13 (MW) ==================================================================== Test                             Result       Flag       Units  Drug Present and Declared for Prescription Verification   Oxycodone                      2323         EXPECTED   ng/mg creat   Oxymorphone                    7355         EXPECTED   ng/mg creat   Noroxycodone                   2746         EXPECTED   ng/mg creat   Noroxymorphone                 805          EXPECTED   ng/mg creat    Sources of oxycodone are scheduled prescription medications.    Oxymorphone, noroxycodone, and noroxymorphone are expected    metabolites of oxycodone. Oxymorphone is also available as a    scheduled prescription medication.  ==================================================================== Test                      Result    Flag   Units      Ref Range   Creatinine              65               mg/dL       >=20 ==================================================================== Declared Medications:  The flagging and interpretation on this report are based on the  following declared medications.  Unexpected results may arise from  inaccuracies in the declared medications.   **Note: The testing scope of this panel includes these medications:   Oxycodone   **Note: The testing scope of this panel does not include the  following reported medications:   Calcium  Cyclobenzaprine (Flexeril)  Diclofenac  Magnesium (Mag-Ox)  Meloxicam (Mobic)  Tizanidine (Zanaflex) ==================================================================== For clinical consultation, please call [phone]. ====================================================================      ROS  Constitutional: Denies any fever or chills Gastrointestinal: No reported hemesis, hematochezia, vomiting, or acute GI distress Musculoskeletal: Denies any acute onset joint swelling, redness, loss of ROM, or weakness Neurological: No reported episodes of acute onset apraxia, aphasia, dysarthria, agnosia, amnesia, paralysis, loss of coordination, or loss of consciousness  Medication Review  Diclofenac Sodium, GNP Calcium 1200, Oxycodone HCl, cyclobenzaprine, meloxicam, and tiZANidine  History Review  Allergy: Mr. Iodice is allergic to amoxicillin and vicodin [hydrocodone-acetaminophen]. Drug: Mr. Bordon  reports no history of drug use. Alcohol:  reports current alcohol use. Tobacco:  reports that he has been smoking cigarettes. He has a 16.00 pack-year smoking history. He has never used smokeless tobacco. Social: Mr. Pease  reports that he has been smoking cigarettes. He has a 16.00 pack-year smoking history. He has never used smokeless tobacco. He reports current alcohol use. He reports that he does not use drugs. Medical:  has a past medical history of Anxiety, Arthritis, and GERD (gastroesophageal reflux disease). Surgical:  Mr. Guerrini  has a past surgical history that includes Back surgery; HAND REIMPLANTED; and Lumbar fusion (11/14). Family: family history is not on file.  Laboratory Chemistry Profile   Renal Lab Results  Component Value Date   BUN 11 01/16/2019   CREATININE 1.08 01/16/2019   BCR 10 01/16/2019   GFRAA 99 01/16/2019   GFRNONAA 86 01/16/2019    Hepatic Lab Results  Component Value Date   AST 24 01/16/2019   ALBUMIN 4.6 01/16/2019   ALKPHOS 108 01/16/2019    Electrolytes Lab Results  Component Value Date   NA 143 01/16/2019   K 4.0 01/16/2019   CL 105 01/16/2019   CALCIUM 10.2 01/16/2019   MG 2.0 01/16/2019    Bone Lab Results  Component Value Date   25OHVITD1 39 01/16/2019   25OHVITD2 1.2 01/16/2019   25OHVITD3 38 01/16/2019    Inflammation (CRP: Acute Phase) (ESR: Chronic Phase) Lab Results  Component Value Date   CRP 4 01/16/2019   ESRSEDRATE 29 (H) 01/16/2019         Note: Above Lab results reviewed.  Recent Imaging Review  DG ELBOW COMPLETE RIGHT (3+VIEW) CLINICAL DATA:  Bilateral elbow pain.  No known injury.  EXAM: RIGHT ELBOW - COMPLETE 3+ VIEW  COMPARISON:  None.  FINDINGS: Normal joint spaces and alignment. There is no evidence of fracture, dislocation, or joint effusion. Tiny olecranon spur. There is no evidence of arthropathy or other focal bone abnormality. Soft tissues are unremarkable.  IMPRESSION: Tiny olecranon spur. Otherwise unremarkable radiographs of the right elbow.  Electronically Signed   By: Keith Rake M.D.   On: 01/19/2021 15:05 DG ELBOW COMPLETE LEFT (3+VIEW) CLINICAL DATA:  Bilateral elbow pain.  No known injury.  EXAM: LEFT ELBOW - COMPLETE 3+ VIEW  COMPARISON:  None.  FINDINGS: Normal joint spaces and alignment. There is no evidence of fracture, dislocation, or joint effusion. There is no evidence of arthropathy or other focal bone abnormality. Soft tissues are unremarkable.  IMPRESSION: Negative  radiographs of the left elbow.  Electronically Signed   By: Keith Rake M.D.   On: 01/19/2021 15:03 Note: Reviewed        Physical Exam  General appearance: Well nourished, well developed, and well hydrated. In no apparent acute distress Mental status: Alert, oriented x 3 (person, place, & time)       Respiratory: No evidence of acute respiratory distress Eyes: PERLA Vitals: BP (!) 149/82 (BP Location: Right Arm, Patient Position: Sitting, Cuff Size: Normal)   Pulse (!) 108   Temp (!) 97 F (36.1 C)   Resp 16   Ht _0  (1.778 m)   Wt 174 lb (78.9 kg)   SpO2 100%   BMI 24.97 kg/m  BMI: Estimated body mass index is 24.97 kg/m as calculated from the following:   Height as of this encounter: _1  (1.778 m).  Weight as of this encounter: 174 lb (78.9 kg). Ideal: Ideal body weight: 73 kg (160 lb 15 oz) Adjusted ideal body weight: 75.4 kg (166 lb 2.6 oz)  Assessment   Status Diagnosis  Controlled Controlled Controlled 1. Chronic pain syndrome   2. Failed back surgical syndrome (x3)   3. Chronic elbow pain (Bilateral)   4. Enthesopathy of elbow region (Bilateral)   5. Sprain of lateral collateral ligament of elbow, sequela (Left)   6. Pharmacologic therapy   7. Chronic use of opiate for therapeutic purpose   8. Encounter for medication management      Updated Problems: No problems updated.  Plan of Care  Problem-specific:  No problem-specific Assessment & Plan notes found for this encounter.  Mr. SOUL HACKMAN has a current medication list which includes the following long-term medication(s): gnp calcium 1200, cyclobenzaprine, meloxicam, [START ON 02/20/2021] oxycodone hcl, [START ON 03/22/2021] oxycodone hcl, [START ON 04/21/2021] oxycodone hcl, and tizanidine.  Pharmacotherapy (Medications Ordered): Meds ordered this encounter  Medications   Oxycodone HCl 10 MG TABS    Sig: Take 1 tablet (10 mg total) by mouth every 6 (six) hours as needed. Must last 30  days    Dispense:  120 tablet    Refill:  0    Not a duplicate. Do NOT delete! Dispense 1 day early if closed on refill date. Avoid benzodiazepines within 8 hours of opioids. Do not send refill requests.   Oxycodone HCl 10 MG TABS    Sig: Take 1 tablet (10 mg total) by mouth every 6 (six) hours as needed. Must last 30 days    Dispense:  120 tablet    Refill:  0    Not a duplicate. Do NOT delete! Dispense 1 day early if closed on refill date. Avoid benzodiazepines within 8 hours of opioids. Do not send refill requests.   Oxycodone HCl 10 MG TABS    Sig: Take 1 tablet (10 mg total) by mouth every 6 (six) hours as needed. Must last 30 days    Dispense:  120 tablet    Refill:  0    Not a duplicate. Do NOT delete! Dispense 1 day early if closed on refill date. Avoid benzodiazepines within 8 hours of opioids. Do not send refill requests.    Orders:  Orders Placed This Encounter  Procedures   [employer] Select 13 (MW), Urine    Volume: 30 ml(s). Minimum 3 ml of urine is needed. Document temperature of fresh sample. Indications: Long term (current) use of opiate analgesic (Z61.096)    Order Specific Question:   Release to patient    Answer:   Immediate    Follow-up plan:   Return in about 13 weeks (around 05/21/2021) for Eval-day(M,W), (F2F), (MM).     Interventional Pending:      Under consideration:   Diagnostic bilateral lumbar facet block #1  Possible bilateral lumbar facet RFA  Diagnostic bilateral sacroiliac joint block #1  Possible bilateral sacroiliac joint RFA  Possible spinal cord stimulator trial    Palliative care options:   Palliative right lateral epicondyle (elbow) injection #2  Palliative left Racz procedure #5 (targeting the left S2, S3 area) (last done 05/02/2020)         Recent Visits Date Type Provider Dept  02/04/21 Telemedicine Milinda Pointer, MD [facility]  01/21/21 Procedure visit Milinda Pointer, MD [facility]  01/01/21  Office Visit Milinda Pointer, MD [facility]  Showing recent visits within past 78  days and meeting all other requirements Today's Visits Date Type Provider Dept  02/19/21 Office Visit Milinda Pointer, MD [facility]  Showing today's visits and meeting all other requirements Future Appointments No visits were found meeting these conditions. Showing future appointments within next 90 days and meeting all other requirements I discussed the assessment and treatment plan with the patient. The patient was provided an opportunity to ask questions and all were answered. The patient agreed with the plan and demonstrated an understanding of the instructions.  Patient advised to call back or seek an in-person evaluation if the symptoms or condition worsens.  Duration of encounter: 30 minutes.  Note by: Gaspar Cola, MD Date: 02/19/2021; Time: 8:52 AM

## 2021-02-19 ENCOUNTER — Encounter: Payer: Self-pay | Admitting: Pain Medicine

## 2021-02-19 ENCOUNTER — Other Ambulatory Visit: Payer: Self-pay

## 2021-02-19 ENCOUNTER — Ambulatory Visit: Payer: BC Managed Care – PPO | Attending: Pain Medicine | Admitting: Pain Medicine

## 2021-02-19 VITALS — BP 149/82 | HR 108 | Temp 97.0°F | Resp 16 | Ht 70.0 in | Wt 174.0 lb

## 2021-02-19 DIAGNOSIS — M778 Other enthesopathies, not elsewhere classified: Secondary | ICD-10-CM | POA: Diagnosis present

## 2021-02-19 DIAGNOSIS — M961 Postlaminectomy syndrome, not elsewhere classified: Secondary | ICD-10-CM | POA: Insufficient documentation

## 2021-02-19 DIAGNOSIS — Z79891 Long term (current) use of opiate analgesic: Secondary | ICD-10-CM | POA: Insufficient documentation

## 2021-02-19 DIAGNOSIS — M25529 Pain in unspecified elbow: Secondary | ICD-10-CM | POA: Diagnosis present

## 2021-02-19 DIAGNOSIS — S53432S Radial collateral ligament sprain of left elbow, sequela: Secondary | ICD-10-CM | POA: Insufficient documentation

## 2021-02-19 DIAGNOSIS — Z79899 Other long term (current) drug therapy: Secondary | ICD-10-CM | POA: Diagnosis present

## 2021-02-19 DIAGNOSIS — G894 Chronic pain syndrome: Secondary | ICD-10-CM | POA: Insufficient documentation

## 2021-02-19 DIAGNOSIS — G8929 Other chronic pain: Secondary | ICD-10-CM | POA: Diagnosis present

## 2021-02-19 MED ORDER — OXYCODONE HCL 10 MG PO TABS: 10.0000 mg | ORAL_TABLET | Freq: Four times a day (QID) | ORAL | 0 refills | Status: DC | PRN

## 2021-02-19 NOTE — Patient Instructions (Signed)
____________________________________________________________________________________________  Medication Rules  Purpose: To inform patients, and their family members, of our rules and regulations.  Applies to: All patients receiving prescriptions (written or electronic).  Pharmacy of record: Pharmacy where electronic prescriptions will be sent. If written prescriptions are taken to a different pharmacy, please inform the nursing staff. The pharmacy listed in the electronic medical record should be the one where you would like electronic prescriptions to be sent.  Electronic prescriptions: In compliance with the [place] Strengthen Opioid Misuse Prevention (STOP) Act of 2017 (Session Law 2017-74/H243), effective July 06, 2018, all controlled substances must be electronically prescribed. Calling prescriptions to the pharmacy will cease to exist.  Prescription refills: Only during scheduled appointments. Applies to all prescriptions.  NOTE: The following applies primarily to controlled substances (Opioid* Pain Medications).   Type of encounter (visit): For patients receiving controlled substances, face-to-face visits are required. (Not an option or up to the patient.)  Patient's responsibilities: Pain Pills: Bring all pain pills to every appointment (except for procedure appointments). Pill Bottles: Bring pills in original pharmacy bottle. Always bring the newest bottle. Bring bottle, even if empty. Medication refills: You are responsible for knowing and keeping track of what medications you take and those you need refilled. The day before your appointment: write a list of all prescriptions that need to be refilled. The day of the appointment: give the list to the admitting nurse. Prescriptions will be written only during appointments. No prescriptions will be written on procedure days. If you forget a medication: it will not be "Called in", "Faxed", or "electronically sent". You will  need to get another appointment to get these prescribed. No early refills. Do not call asking to have your prescription filled early. Prescription Accuracy: You are responsible for carefully inspecting your prescriptions before leaving our office. Have the discharge nurse carefully go over each prescription with you, before taking them home. Make sure that your name is accurately spelled, that your address is correct. Check the name and dose of your medication to make sure it is accurate. Check the number of pills, and the written instructions to make sure they are clear and accurate. Make sure that you are given enough medication to last until your next medication refill appointment. Taking Medication: Take medication as prescribed. When it comes to controlled substances, taking less pills or less frequently than prescribed is permitted and encouraged. Never take more pills than instructed. Never take medication more frequently than prescribed.  Inform other Doctors: Always inform, all of your healthcare providers, of all the medications you take. Pain Medication from other Providers: You are not allowed to accept any additional pain medication from any other Doctor or Healthcare provider. There are two exceptions to this rule. (see below) In the event that you require additional pain medication, you are responsible for notifying us, as stated below. Cough Medicine: Often these contain an opioid, such as codeine or hydrocodone. Never accept or take cough medicine containing these opioids if you are already taking an opioid* medication. The combination may cause respiratory failure and death. Medication Agreement: You are responsible for carefully reading and following our Medication Agreement. This must be signed before receiving any prescriptions from our practice. Safely store a copy of your signed Agreement. Violations to the Agreement will result in no further prescriptions. (Additional copies of our  Medication Agreement are available upon request.) Laws, Rules, & Regulations: All patients are expected to follow all Federal and State Laws, Statutes, Rules, & Regulations. Ignorance of   the Laws does not constitute a valid excuse.  Illegal drugs and Controlled Substances: The use of illegal substances (including, but not limited to marijuana and its derivatives) and/or the illegal use of any controlled substances is strictly prohibited. Violation of this rule may result in the immediate and permanent discontinuation of any and all prescriptions being written by our practice. The use of any illegal substances is prohibited. Adopted CDC guidelines & recommendations: Target dosing levels will be at or below 60 MME/day. Use of benzodiazepines** is not recommended.  Exceptions: There are only two exceptions to the rule of not receiving pain medications from other Healthcare Providers. Exception #1 (Emergencies): In the event of an emergency (i.e.: accident requiring emergency care), you are allowed to receive additional pain medication. However, you are responsible for: As soon as you are able, call our office [phone], at any time of the day or night, and leave a message stating your name, the date and nature of the emergency, and the name and dose of the medication prescribed. In the event that your call is answered by a member of our staff, make sure to document and save the date, time, and the name of the person that took your information.  Exception #2 (Planned Surgery): In the event that you are scheduled by another doctor or dentist to have any type of surgery or procedure, you are allowed (for a period no longer than 30 days), to receive additional pain medication, for the acute post-op pain. However, in this case, you are responsible for picking up a copy of our "Post-op Pain Management for Surgeons" handout, and giving it to your surgeon or dentist. This document is available at our office, and  does not require an appointment to obtain it. Simply go to our office during business hours (Monday-Thursday from 8:00 AM to 4:00 PM) (Friday 8:00 AM to 12:00 Noon) or if you have a scheduled appointment with us, prior to your surgery, and ask for it by name. In addition, you are responsible for: calling our office [phone], at any time of the day or night, and leaving a message stating your name, name of your surgeon, type of surgery, and date of procedure or surgery. Failure to comply with your responsibilities may result in termination of therapy involving the controlled substances.  *Opioid medications include: morphine, codeine, oxycodone, oxymorphone, hydrocodone, hydromorphone, meperidine, tramadol, tapentadol, buprenorphine, fentanyl, methadone. **Benzodiazepine medications include: diazepam (Valium), alprazolam (Xanax), clonazepam (Klonopine), lorazepam (Ativan), clorazepate (Tranxene), chlordiazepoxide (Librium), estazolam (Prosom), oxazepam (Serax), temazepam (Restoril), triazolam (Halcion) (Last updated: 06/03/2020) ____________________________________________________________________________________________  ____________________________________________________________________________________________  Medication Recommendations and Reminders  Applies to: All patients receiving prescriptions (written and/or electronic).  Medication Rules & Regulations: These rules and regulations exist for your safety and that of others. They are not flexible and neither are we. Dismissing or ignoring them will be considered "non-compliance" with medication therapy, resulting in complete and irreversible termination of such therapy. (See document titled "Medication Rules" for more details.) In all conscience, because of safety reasons, we cannot continue providing a therapy where the patient does not follow instructions.  Pharmacy of record:  Definition: This is the pharmacy where your electronic  prescriptions will be sent.  We do not endorse any particular pharmacy, however, we have experienced problems with [facility] not securing enough medication supply for the community. We do not restrict you in your choice of pharmacy. However, once we write for your prescriptions, we will NOT be re-sending more prescriptions to fix restricted supply problems   created by your pharmacy, or your insurance.  The pharmacy listed in the electronic medical record should be the one where you want electronic prescriptions to be sent. If you choose to change pharmacy, simply notify our nursing staff.  Recommendations: Keep all of your pain medications in a safe place, under lock and key, even if you live alone. We will NOT replace lost, stolen, or damaged medication. After you fill your prescription, take 1 week's worth of pills and put them away in a safe place. You should keep a separate, properly labeled bottle for this purpose. The remainder should be kept in the original bottle. Use this as your primary supply, until it runs out. Once it's gone, then you know that you have 1 week's worth of medicine, and it is time to come in for a prescription refill. If you do this correctly, it is unlikely that you will ever run out of medicine. To make sure that the above recommendation works, it is very important that you make sure your medication refill appointments are scheduled at least 1 week before you run out of medicine. To do this in an effective manner, make sure that you do not leave the office without scheduling your next medication management appointment. Always ask the nursing staff to show you in your prescription , when your medication will be running out. Then arrange for the receptionist to get you a return appointment, at least 7 days before you run out of medicine. Do not wait until you have 1 or 2 pills left, to come in. This is very poor planning and does not take into consideration that we may need to  cancel appointments due to bad weather, sickness, or emergencies affecting our staff. DO NOT ACCEPT A "Partial Fill": If for any reason your pharmacy does not have enough pills/tablets to completely fill or refill your prescription, do not allow for a "partial fill". The law allows the pharmacy to complete that prescription within 72 hours, without requiring a new prescription. If they do not fill the rest of your prescription within those 72 hours, you will need a separate prescription to fill the remaining amount, which we will NOT provide. If the reason for the partial fill is your insurance, you will need to talk to the pharmacist about payment alternatives for the remaining tablets, but again, DO NOT ACCEPT A PARTIAL FILL, unless you can trust your pharmacist to obtain the remainder of the pills within 72 hours.  Prescription refills and/or changes in medication(s):  Prescription refills, and/or changes in dose or medication, will be conducted only during scheduled medication management appointments. (Applies to both, written and electronic prescriptions.) No refills on procedure days. No medication will be changed or started on procedure days. No changes, adjustments, and/or refills will be conducted on a procedure day. Doing so will interfere with the diagnostic portion of the procedure. No phone refills. No medications will be "called into the pharmacy". No Fax refills. No weekend refills. No Holliday refills. No after hours refills.  Remember:  Business hours are:  Monday to Thursday 8:00 AM to 4:00 PM Provider's Schedule: Azaryah Oleksy, MD - Appointments are:  Medication management: Monday and Wednesday 8:00 AM to 4:00 PM Procedure day: Tuesday and Thursday 7:30 AM to 4:00 PM Bilal Lateef, MD - Appointments are:  Medication management: Tuesday and Thursday 8:00 AM to 4:00 PM Procedure day: Monday and Wednesday 7:30 AM to 4:00 PM (Last update:  01/24/2020) ____________________________________________________________________________________________  ____________________________________________________________________________________________  CBD (cannabidiol) WARNING    Applicable to: All individuals currently taking or considering taking CBD (cannabidiol) and, more important, all patients taking opioid analgesic controlled substances (pain medication). (Example: oxycodone; oxymorphone; hydrocodone; hydromorphone; morphine; methadone; tramadol; tapentadol; fentanyl; buprenorphine; butorphanol; dextromethorphan; meperidine; codeine; etc.)  Legal status: CBD remains a Schedule I drug prohibited for any use. CBD is illegal with one exception. In the [place], CBD has a limited Food and Drug Administration (FDA) approval for the treatment of two specific types of epilepsy disorders. Only one CBD product has been approved by the FDA for this purpose: "Epidiolex". FDA is aware that some companies are marketing products containing cannabis and cannabis-derived compounds in ways that violate the Federal Food, Drug and Cosmetic Act (FD&C Act) and that may put the health and safety of consumers at risk. The FDA, a Federal agency, has not enforced the CBD status since 2018.   Legality: Some manufacturers ship CBD products nationally, which is illegal. Often such products are sold online and are therefore available throughout the country. CBD is openly sold in head shops and health food stores in some states where such sales have not been explicitly legalized. Selling unapproved products with unsubstantiated therapeutic claims is not only a violation of the law, but also can put patients at risk, as these products have not been proven to be safe or effective. Federal illegality makes it difficult to conduct research on CBD.  Reference: "FDA Regulation of Cannabis and Cannabis-Derived Products, Including Cannabidiol (CBD)" -  https://www.fda.gov/news-events/public-health-focus/fda-regulation-cannabis-and-cannabis-derived-products-including-cannabidiol-cbd  Warning: CBD is not FDA approved and has not undergo the same manufacturing controls as prescription drugs.  This means that the purity and safety of available CBD may be questionable. Most of the time, despite manufacturer's claims, it is contaminated with THC (delta-9-tetrahydrocannabinol - the chemical in marijuana responsible for the "HIGH").  When this is the case, the THC contaminant will trigger a positive urine drug screen (UDS) test for Marijuana (carboxy-THC). Because a positive UDS for any illicit substance is a violation of our medication agreement, your opioid analgesics (pain medicine) may be permanently discontinued.  MORE ABOUT CBD  General Information: CBD  is a derivative of the Marijuana (cannabis sativa) plant discovered in 1940. It is one of the 113 identified substances found in Marijuana. It accounts for up to 40% of the plant's extract. As of 2018, preliminary clinical studies on CBD included research for the treatment of anxiety, movement disorders, and pain. CBD is available and consumed in multiple forms, including inhalation of smoke or vapor, as an aerosol spray, and by mouth. It may be supplied as an oil containing CBD, capsules, dried cannabis, or as a liquid solution. CBD is thought not to be as psychoactive as THC (delta-9-tetrahydrocannabinol - the chemical in marijuana responsible for the "HIGH"). Studies suggest that CBD may interact with different biological target receptors in the body, including cannabinoid and other neurotransmitter receptors. As of 2018 the mechanism of action for its biological effects has not been determined.  Side-effects  Adverse reactions: Dry mouth, diarrhea, decreased appetite, fatigue, drowsiness, malaise, weakness, sleep disturbances, and others.  Drug interactions: CBC may interact with other medications  such as blood-thinners. (Last update: 02/10/2020) ____________________________________________________________________________________________  ____________________________________________________________________________________________  Drug Holidays (Slow)  What is a "Drug Holiday"? Drug Holiday: is the name given to the period of time during which a patient stops taking a medication(s) for the purpose of eliminating tolerance to the drug.  Benefits Improved effectiveness of opioids. Decreased opioid dose needed to achieve benefits. Improved pain with lesser dose.    What is tolerance? Tolerance: is the progressive decreased in effectiveness of a drug due to its repetitive use. With repetitive use, the body gets use to the medication and as a consequence, it loses its effectiveness. This is a common problem seen with opioid pain medications. As a result, a larger dose of the drug is needed to achieve the same effect that used to be obtained with a smaller dose.  How long should a "Drug Holiday" last? You should stay off of the pain medicine for at least 14 consecutive days. (2 weeks)  Should I stop the medicine "cold turkey"? No. You should always coordinate with your Pain Specialist so that he/she can provide you with the correct medication dose to make the transition as smoothly as possible.  How do I stop the medicine? Slowly. You will be instructed to decrease the daily amount of pills that you take by one (1) pill every seven (7) days. This is called a "slow downward taper" of your dose. For example: if you normally take four (4) pills per day, you will be asked to drop this dose to three (3) pills per day for seven (7) days, then to two (2) pills per day for seven (7) days, then to one (1) per day for seven (7) days, and at the end of those last seven (7) days, this is when the "Drug Holiday" would start.   Will I have withdrawals? By doing a "slow downward taper" like this one, it  is unlikely that you will experience any significant withdrawal symptoms. Typically, what triggers withdrawals is the sudden stop of a high dose opioid therapy. Withdrawals can usually be avoided by slowly decreasing the dose over a prolonged period of time. If you do not follow these instructions and decide to stop your medication abruptly, withdrawals may be possible.  What are withdrawals? Withdrawals: refers to the wide range of symptoms that occur after stopping or dramatically reducing opiate drugs after heavy and prolonged use. Withdrawal symptoms do not occur to patients that use low dose opioids, or those who take the medication sporadically. Contrary to benzodiazepine (example: Valium, Xanax, etc.) or alcohol withdrawals ("Delirium Tremens"), opioid withdrawals are not lethal. Withdrawals are the physical manifestation of the body getting rid of the excess receptors.  Expected Symptoms Early symptoms of withdrawal may include: Agitation Anxiety Muscle aches Increased tearing Insomnia Runny nose Sweating Yawning  Late symptoms of withdrawal may include: Abdominal cramping Diarrhea Dilated pupils Goose bumps Nausea Vomiting  Will I experience withdrawals? Due to the slow nature of the taper, it is very unlikely that you will experience any.  What is a slow taper? Taper: refers to the gradual decrease in dose.  (Last update: 01/24/2020) ____________________________________________________________________________________________    

## 2021-02-19 NOTE — Progress Notes (Signed)
Nursing Pain Medication Assessment:  Safety precautions to be maintained throughout the outpatient stay will include: orient to surroundings, keep bed in low position, maintain call bell within reach at all times, provide assistance with transfer out of bed and ambulation.  Medication Inspection Compliance: Pill count conducted under aseptic conditions, in front of the patient. Neither the pills nor the bottle was removed from the patient's sight at any time. Once count was completed pills were immediately returned to the patient in their original bottle.  Medication: Oxycodone IR Pill/Patch Count:  2 of 120 pills remain Pill/Patch Appearance: Markings consistent with prescribed medication Bottle Appearance: Standard pharmacy container. Clearly labeled. Filled Date: 07 / 19 / 2022 Last Medication intake:  Today

## 2021-02-23 LAB — TOXASSURE SELECT 13 (MW), URINE

## 2021-05-19 ENCOUNTER — Telehealth: Payer: Self-pay | Admitting: Pain Medicine

## 2021-05-19 NOTE — Telephone Encounter (Signed)
Sent note thru mychart asking patient to come at 8 am

## 2021-05-20 NOTE — Progress Notes (Signed)
PROVIDER NOTE: Information contained herein reflects review and annotations entered in association with encounter. Interpretation of such information and data should be left to medically-trained personnel. Information provided to patient can be located elsewhere in the medical record under "Patient Instructions". Document created using STT-dictation technology, any transcriptional errors that may result from process are unintentional.    Patient: Derek [name]  Service Category: E/M  Provider: Gaspar Cola, MD  DOB: 04-Dec-1980  DOS: 05/21/2021  Specialty: Interventional Pain Management  MRN: [id number]  Setting: Ambulatory outpatient  PCP: Jodi Marble, MD  Type: Established Patient    Referring Provider: Jodi Marble, MD  Location: Office  Delivery: Face-to-face     HPI  Mr. LAVELLE AKEL, a 40 y.o. year old male, is here today because of his Chronic bilateral low back pain with bilateral sciatica [M54.42, M54.41, G89.29]. Mr. Musa primary complain today is Back Pain Last encounter: My last encounter with him was on 05/19/2021. Pertinent problems: Mr. Ashland has Lumbar pseudoarthrosis (L5-S1); Chronic low back pain (1ry area of Pain) (Bilateral) (L>R) w/ sciatica (Bilateral); Chronic lower extremity pain (2ry area of Pain) (Bilateral) (L>R); Chronic pain syndrome; Failed back surgical syndrome (x3); L5-S1 pseudoarthrosis; Chronic musculoskeletal pain; Spasm of back muscles; Sacroiliac joint dysfunction (Bilateral); Chronic sacroiliac joint pain (Bilateral) (L>R); Somatic dysfunction of sacroiliac joints (Bilateral); Chronic hip pain (Bilateral); Epidural fibrosis; Lumbar postlaminectomy syndrome; DDD (degenerative disc disease), lumbosacral; Lumbar facet syndrome (Bilateral) (L>R); Other specified dorsopathies, sacral and sacrococcygeal region; Spondylosis without myelopathy or radiculopathy, lumbosacral region; Neurogenic pain; Osteoarthritic spondylosis of lumbar spine; Tendinitis  of elbows (Bilateral); Lateral epicondylitis of elbows (Bilateral); Chronic elbow pain (Right); Chronic low back pain (Left) w/o sciatica; Numbness of anterior thigh (Right); Burning pain in thigh (Right); Chronic low back pain (Midline) w/o sciatica; Acute exacerbation of chronic low back pain; Chronic elbow pain (Bilateral); Radial collateral ligament sprain of elbow, unspecified laterality, sequela (Bilateral); Enthesopathy of elbow region (Bilateral); Sprain of lateral collateral ligament of elbow, sequela (Left); and Sprain of lateral collateral ligament of elbow, sequela (Right) on their pertinent problem list. Pain Assessment: Severity of Chronic pain is reported as a 7 /10. Location: Back Lower, Left/down back of leg to left heel. Onset: More than a month ago. Quality: Aching. Timing: Constant. Modifying factor(s): medications, procedures. Vitals:  height is '5\' 10"'  (1.778 m) and weight is 171 lb (77.6 kg). His temperature is 97.5 F (36.4 C) (abnormal). His blood pressure is 121/73 and his pulse is 112 (abnormal). His respiration is 16 and oxygen saturation is 100%.   Reason for encounter: medication management.   The patient indicates doing well with the current medication regimen. No adverse reactions or side effects reported to the medications.   The patient indicates recurrence of his left lower extremity radicular symptoms including pain and weakness.  In the past we have treated this successfully with a Racz procedure.  The patient indicates that he would like to have this repeated.  RTCB: 08/19/2021 Nonopioids transferred 05/02/2020: Zanaflex, Flexeril, and Mobic  Pharmacotherapy Assessment  Analgesic: Oxycodone IR 10 mg, 1 tab PO q 6 hrs (40 mg/day of oxycodone) MME/day: 60 mg/day.   Monitoring: [place] PMP: PDMP reviewed during this encounter.       Pharmacotherapy: No side-effects or adverse reactions reported. Compliance: No problems identified. Effectiveness: Clinically  acceptable.  Ignatius Specking, RN  05/21/2021  8:30 AM  Sign when Signing Visit Nursing Pain Medication Assessment:  Safety precautions to be maintained throughout the outpatient  stay will include: orient to surroundings, keep bed in low position, maintain call bell within reach at all times, provide assistance with transfer out of bed and ambulation.  Medication Inspection Compliance: Pill count conducted under aseptic conditions, in front of the patient. Neither the pills nor the bottle was removed from the patient's sight at any time. Once count was completed pills were immediately returned to the patient in their original bottle.  Medication: See above Pill/Patch Count:  0 of 120 pills remain Pill/Patch Appearance: Markings consistent with prescribed medication Bottle Appearance: Standard pharmacy container. Clearly labeled. Filled Date: 52 / 17 / 2022 Last Medication intake:  Today     UDS:  Summary  Date Value Ref Range Status  02/19/2021 Note  Final    Comment:    ==================================================================== [employer] [facility] 13 (MW) ==================================================================== Test                             Result       Flag       Units  Drug Present and Declared for Prescription Verification   Oxycodone                      729          EXPECTED   ng/mg creat   Oxymorphone                    3202         EXPECTED   ng/mg creat   Noroxycodone                   1253         EXPECTED   ng/mg creat   Noroxymorphone                 627          EXPECTED   ng/mg creat    Sources of oxycodone are scheduled prescription medications.    Oxymorphone, noroxycodone, and noroxymorphone are expected    metabolites of oxycodone. Oxymorphone is also available as a    scheduled prescription medication.  ==================================================================== Test                      Result    Flag   Units      Ref Range    Creatinine              45               mg/dL      >=20 ==================================================================== Declared Medications:  The flagging and interpretation on this report are based on the  following declared medications.  Unexpected results may arise from  inaccuracies in the declared medications.   **Note: The testing scope of this panel includes these medications:   Oxycodone   **Note: The testing scope of this panel does not include the  following reported medications:   Calcium  Cyclobenzaprine (Flexeril)  Meloxicam (Mobic)  Tizanidine (Zanaflex)  Topical Diclofenac  Vitamin D ==================================================================== For clinical consultation, please call [phone]. ====================================================================      ROS  Constitutional: Denies any fever or chills Gastrointestinal: No reported hemesis, hematochezia, vomiting, or acute GI distress Musculoskeletal: Denies any acute onset joint swelling, redness, loss of ROM, or weakness Neurological: No reported episodes of acute onset apraxia, aphasia, dysarthria, agnosia, amnesia, paralysis, loss of coordination, or loss of consciousness  Medication Review  Diclofenac Sodium, GNP Calcium 1200, Oxycodone HCl, cyclobenzaprine, meloxicam, and tiZANidine  History Review  Allergy: Mr. Vanvranken is allergic to amoxicillin and vicodin [hydrocodone-acetaminophen]. Drug: Mr. Lappe  reports no history of drug use. Alcohol:  reports current alcohol use. Tobacco:  reports that he has been smoking cigarettes. He has a 16.00 pack-year smoking history. He has never used smokeless tobacco. Social: Mr. Disano  reports that he has been smoking cigarettes. He has a 16.00 pack-year smoking history. He has never used smokeless tobacco. He reports current alcohol use. He reports that he does not use drugs. Medical:  has a past medical history of Anxiety, Arthritis, and  GERD (gastroesophageal reflux disease). Surgical: Mr. Leverich  has a past surgical history that includes Back surgery; HAND REIMPLANTED; and Lumbar fusion (11/14). Family: family history is not on file.  Laboratory Chemistry Profile   Renal Lab Results  Component Value Date   BUN 11 01/16/2019   CREATININE 1.08 01/16/2019   BCR 10 01/16/2019   GFRAA 99 01/16/2019   GFRNONAA 86 01/16/2019    Hepatic Lab Results  Component Value Date   AST 24 01/16/2019   ALBUMIN 4.6 01/16/2019   ALKPHOS 108 01/16/2019    Electrolytes Lab Results  Component Value Date   NA 143 01/16/2019   K 4.0 01/16/2019   CL 105 01/16/2019   CALCIUM 10.2 01/16/2019   MG 2.0 01/16/2019    Bone Lab Results  Component Value Date   25OHVITD1 39 01/16/2019   25OHVITD2 1.2 01/16/2019   25OHVITD3 38 01/16/2019    Inflammation (CRP: Acute Phase) (ESR: Chronic Phase) Lab Results  Component Value Date   CRP 4 01/16/2019   ESRSEDRATE 29 (H) 01/16/2019         Note: Above Lab results reviewed.  Recent Imaging Review  DG ELBOW COMPLETE RIGHT (3+VIEW) CLINICAL DATA:  Bilateral elbow pain.  No known injury.  EXAM: RIGHT ELBOW - COMPLETE 3+ VIEW  COMPARISON:  None.  FINDINGS: Normal joint spaces and alignment. There is no evidence of fracture, dislocation, or joint effusion. Tiny olecranon spur. There is no evidence of arthropathy or other focal bone abnormality. Soft tissues are unremarkable.  IMPRESSION: Tiny olecranon spur. Otherwise unremarkable radiographs of the right elbow.  Electronically Signed   By: Keith Rake M.D.   On: 01/19/2021 15:05 DG ELBOW COMPLETE LEFT (3+VIEW) CLINICAL DATA:  Bilateral elbow pain.  No known injury.  EXAM: LEFT ELBOW - COMPLETE 3+ VIEW  COMPARISON:  None.  FINDINGS: Normal joint spaces and alignment. There is no evidence of fracture, dislocation, or joint effusion. There is no evidence of arthropathy or other focal bone abnormality. Soft tissues  are unremarkable.  IMPRESSION: Negative radiographs of the left elbow.  Electronically Signed   By: Keith Rake M.D.   On: 01/19/2021 15:03 Note: Reviewed        Physical Exam  General appearance: Well nourished, well developed, and well hydrated. In no apparent acute distress Mental status: Alert, oriented x 3 (person, place, & time)       Respiratory: No evidence of acute respiratory distress Eyes: PERLA Vitals: BP 121/73   Pulse (!) 112   Temp (!) 97.5 F (36.4 C)   Resp 16   Ht '5\' 10"'  (1.778 m)   Wt 171 lb (77.6 kg)   SpO2 100%   BMI 24.54 kg/m  BMI: Estimated body mass index is 24.54 kg/m as calculated from the following:   Height as of this encounter:  '5\' 10"'  (1.778 m).   Weight as of this encounter: 171 lb (77.6 kg). Ideal: Ideal body weight: 73 kg (160 lb 15 oz) Adjusted ideal body weight: 74.8 kg (164 lb 15.4 oz)  Assessment   Status Diagnosis  Controlled Controlled Controlled 1. Chronic low back pain (1ry area of Pain) (Bilateral) (L>R) w/ sciatica (Bilateral)   2. Chronic lower extremity pain (2ry area of Pain) (Bilateral) (L>R)   3. Failed back surgical syndrome (x3)   4. Chronic pain syndrome   5. Pharmacologic therapy   6. Chronic use of opiate for therapeutic purpose   7. Encounter for medication management   8. Epidural fibrosis      Updated Problems: No problems updated.  Plan of Care  Problem-specific:  No problem-specific Assessment & Plan notes found for this encounter.  Mr. TYR FRANCA has a current medication list which includes the following long-term medication(s): gnp calcium 1200, cyclobenzaprine, meloxicam, oxycodone hcl, [START ON 06/20/2021] oxycodone hcl, [START ON 07/20/2021] oxycodone hcl, and tizanidine.  Pharmacotherapy (Medications Ordered): Meds ordered this encounter  Medications   Oxycodone HCl 10 MG TABS    Sig: Take 1 tablet (10 mg total) by mouth every 6 (six) hours as needed. Must last 30 days    Dispense:   120 tablet    Refill:  0    DO NOT: delete (not duplicate); no partial-fill (will deny script to complete), no refill request (F/U required). DISPENSE: 1 day early if closed on fill date. WARN: No CNS-depressants within 8 hrs of med.   Oxycodone HCl 10 MG TABS    Sig: Take 1 tablet (10 mg total) by mouth every 6 (six) hours as needed. Must last 30 days    Dispense:  120 tablet    Refill:  0    DO NOT: delete (not duplicate); no partial-fill (will deny script to complete), no refill request (F/U required). DISPENSE: 1 day early if closed on fill date. WARN: No CNS-depressants within 8 hrs of med.   Oxycodone HCl 10 MG TABS    Sig: Take 1 tablet (10 mg total) by mouth every 6 (six) hours as needed. Must last 30 days    Dispense:  120 tablet    Refill:  0    DO NOT: delete (not duplicate); no partial-fill (will deny script to complete), no refill request (F/U required). DISPENSE: 1 day early if closed on fill date. WARN: No CNS-depressants within 8 hrs of med.    Orders:  Orders Placed This Encounter  Procedures   Racz Epidurolysis    Standing Status:   Future    Standing Expiration Date:   08/21/2021    Scheduling Instructions:     Procedure: RACZ Epidural Lysis of Adhesions     Side: Midline to left     Sedation: Moderate Conscious Sedation     Timeframe: ASAA    Order Specific Question:   Where will this procedure be performed?    Answer:   [facility]    Follow-up plan:   Return for (ECT) procedure: (L) RACZ .     Interventional Therapies  Risk  Complexity Considerations:   Estimated body mass index is 24.54 kg/m as calculated from the following:   Height as of this encounter: '5\' 10"'  (1.778 m).   Weight as of this encounter: 171 lb (77.6 kg). WNL   Planned  Pending:   Pending further evaluation   Under consideration:   Diagnostic bilateral lumbar facet block #1  Possible bilateral lumbar facet RFA  Diagnostic bilateral sacroiliac joint block #1  Possible  bilateral sacroiliac joint RFA  Possible spinal cord stimulator trial    Completed:   Therapeutic right L2-3 LESI x1 (08/29/2020)  Diagnostic left lumbar facet MBB x1 (03/21/2020)  Diagnostic left SI block x1 (03/21/2020)  Therapeutic/palliative right lateral epicondyle (elbow) injection x2 (01/21/2021)  Therapeutic/palliative left Racz procedure x5 (targeting the left S2, S3 area) (05/02/2020)   Therapeutic  Palliative (PRN) options:   Palliative right lateral epicondyle (elbow) injection #2  Palliative left Racz procedure #5 (targeting the left S2, S3 area) (last done 05/02/2020)    Recent Visits No visits were found meeting these conditions. Showing recent visits within past 90 days and meeting all other requirements Today's Visits Date Type Provider Dept  05/21/21 Office Visit Milinda Pointer, MD [facility]  Showing today's visits and meeting all other requirements Future Appointments Date Type Provider Dept  08/14/21 Appointment Gillis Santa, MD [facility]  Showing future appointments within next 90 days and meeting all other requirements I discussed the assessment and treatment plan with the patient. The patient was provided an opportunity to ask questions and all were answered. The patient agreed with the plan and demonstrated an understanding of the instructions.  Patient advised to call back or seek an in-person evaluation if the symptoms or condition worsens.  Duration of encounter: 30 minutes.  Note by: Gaspar Cola, MD Date: 05/21/2021; Time: 8:52 AM

## 2021-05-21 ENCOUNTER — Other Ambulatory Visit: Payer: Self-pay

## 2021-05-21 ENCOUNTER — Ambulatory Visit: Payer: BC Managed Care – PPO | Attending: Pain Medicine | Admitting: Pain Medicine

## 2021-05-21 ENCOUNTER — Encounter: Payer: Self-pay | Admitting: Pain Medicine

## 2021-05-21 VITALS — BP 121/73 | HR 112 | Temp 97.5°F | Resp 16 | Ht 70.0 in | Wt 171.0 lb

## 2021-05-21 DIAGNOSIS — G8929 Other chronic pain: Secondary | ICD-10-CM | POA: Diagnosis present

## 2021-05-21 DIAGNOSIS — M961 Postlaminectomy syndrome, not elsewhere classified: Secondary | ICD-10-CM | POA: Insufficient documentation

## 2021-05-21 DIAGNOSIS — M79604 Pain in right leg: Secondary | ICD-10-CM | POA: Insufficient documentation

## 2021-05-21 DIAGNOSIS — Z79891 Long term (current) use of opiate analgesic: Secondary | ICD-10-CM | POA: Diagnosis present

## 2021-05-21 DIAGNOSIS — M5441 Lumbago with sciatica, right side: Secondary | ICD-10-CM | POA: Insufficient documentation

## 2021-05-21 DIAGNOSIS — G96198 Other disorders of meninges, not elsewhere classified: Secondary | ICD-10-CM | POA: Insufficient documentation

## 2021-05-21 DIAGNOSIS — G894 Chronic pain syndrome: Secondary | ICD-10-CM | POA: Insufficient documentation

## 2021-05-21 DIAGNOSIS — M79605 Pain in left leg: Secondary | ICD-10-CM | POA: Insufficient documentation

## 2021-05-21 DIAGNOSIS — M5442 Lumbago with sciatica, left side: Secondary | ICD-10-CM | POA: Insufficient documentation

## 2021-05-21 DIAGNOSIS — Z79899 Other long term (current) drug therapy: Secondary | ICD-10-CM | POA: Diagnosis present

## 2021-05-21 MED ORDER — OXYCODONE HCL 10 MG PO TABS: 10.0000 mg | ORAL_TABLET | Freq: Four times a day (QID) | ORAL | 0 refills | Status: DC | PRN

## 2021-05-21 NOTE — Patient Instructions (Signed)
______________________________________________________________________  Preparing for Procedure with Sedation  NOTICE: Due to recent regulatory changes, starting on February 03, 2021, procedures requiring intravenous (IV) sedation will no longer be performed at the [facility].  These types of procedures are required to be performed at [facility] ambulatory surgery facility.  We are very sorry for the inconvenience.  Procedure appointments are limited to planned procedures: No Prescription Refills. No disability issues will be discussed. No medication changes will be discussed.  Instructions: Oral Intake: Do not eat or drink anything for at least 8 hours prior to your procedure. (Exception: Blood Pressure Medication. See below.) Transportation: A driver is required. You may not drive yourself after the procedure. Blood Pressure Medicine: Do not forget to take your blood pressure medicine with a sip of water the morning of the procedure. If your Diastolic (lower reading) is above 100 mmHg, elective cases will be cancelled/rescheduled. Blood thinners: These will need to be stopped for procedures. Notify our staff if you are taking any blood thinners. Depending on which one you take, there will be specific instructions on how and when to stop it. Diabetics on insulin: Notify the staff so that you can be scheduled 1st case in the morning. If your diabetes requires high dose insulin, take only  of your normal insulin dose the morning of the procedure and notify the staff that you have done so. Preventing infections: Shower with an antibacterial soap the morning of your procedure. Build-up your immune system: Take 1000 mg of Vitamin C with every meal (3 times a day) the day prior to your procedure. Antibiotics: Inform the staff if you have a condition or reason that requires you to take antibiotics before dental procedures. Pregnancy: If you are pregnant, call and cancel the procedure. Sickness: If  you have a cold, fever, or any active infections, call and cancel the procedure. Arrival: You must be in the facility at least 30 minutes prior to your scheduled procedure. Children: Do not bring children with you. Dress appropriately: Bring dark clothing that you would not mind if they get stained. Valuables: Do not bring any jewelry or valuables.  Reasons to call and reschedule or cancel your procedure: (Following these recommendations will minimize the risk of a serious complication.) Surgeries: Avoid having procedures within 2 weeks of any surgery. (Avoid for 2 weeks before or after any surgery). Flu Shots: Avoid having procedures within 2 weeks of a flu shots. (Avoid for 2 weeks before or after immunizations). Barium: Avoid having a procedure within 7-10 days after having had a radiological study involving the use of radiological contrast. (Myelograms, Barium swallow or enema study). Heart attacks: Avoid any elective procedures or surgeries for the initial 6 months after a "Myocardial Infarction" (Heart Attack). Blood thinners: It is imperative that you stop these medications before procedures. Let us know if you if you take any blood thinner.  Infection: Avoid procedures during or within two weeks of an infection (including chest colds or gastrointestinal problems). Symptoms associated with infections include: Localized redness, fever, chills, night sweats or profuse sweating, burning sensation when voiding, cough, congestion, stuffiness, runny nose, sore throat, diarrhea, nausea, vomiting, cold or Flu symptoms, recent or current infections. It is specially important if the infection is over the area that we intend to treat. Heart and lung problems: Symptoms that may suggest an active cardiopulmonary problem include: cough, chest pain, breathing difficulties or shortness of breath, dizziness, ankle swelling, uncontrolled high or unusually low blood pressure, and/or palpitations. If you are    experiencing any of these symptoms, cancel your procedure and contact your primary care physician for an evaluation.  Remember:  Regular Business hours are:  Monday to Thursday 8:00 AM to 4:00 PM  Provider's Schedule: Francisco Naveira, MD:  Procedure days: Tuesday and Thursday 7:30 AM to 4:00 PM  Bilal Lateef, MD:  Procedure days: Monday and Wednesday 7:30 AM to 4:00 PM ______________________________________________________________________   

## 2021-05-21 NOTE — Progress Notes (Signed)
Nursing Pain Medication Assessment:  Safety precautions to be maintained throughout the outpatient stay will include: orient to surroundings, keep bed in low position, maintain call bell within reach at all times, provide assistance with transfer out of bed and ambulation.  Medication Inspection Compliance: Pill count conducted under aseptic conditions, in front of the patient. Neither the pills nor the bottle was removed from the patient's sight at any time. Once count was completed pills were immediately returned to the patient in their original bottle.  Medication: See above Pill/Patch Count:  0 of 120 pills remain Pill/Patch Appearance: Markings consistent with prescribed medication Bottle Appearance: Standard pharmacy container. Clearly labeled. Filled Date: 22 / 17 / 2022 Last Medication intake:  Today

## 2021-05-22 ENCOUNTER — Telehealth: Payer: Self-pay

## 2021-05-22 NOTE — Telephone Encounter (Signed)
RACZ  scheduled for 06/05/21 at 0840. Please notify pharmacy.

## 2021-06-05 ENCOUNTER — Ambulatory Visit
Admission: RE | Admit: 2021-06-05 | Discharge: 2021-06-05 | Disposition: A | Discharge status: Home / Self Care | Payer: BC Managed Care – PPO | Source: Ambulatory Visit | Attending: Pain Medicine | Admitting: Pain Medicine

## 2021-06-05 ENCOUNTER — Other Ambulatory Visit: Payer: Self-pay

## 2021-06-05 ENCOUNTER — Ambulatory Visit (HOSPITAL_BASED_OUTPATIENT_CLINIC_OR_DEPARTMENT_OTHER): Payer: BC Managed Care – PPO | Admitting: Pain Medicine

## 2021-06-05 VITALS — BP 137/92 | HR 94 | Temp 97.0°F | Resp 17 | Ht 70.0 in | Wt 175.0 lb

## 2021-06-05 DIAGNOSIS — G96198 Other disorders of meninges, not elsewhere classified: Secondary | ICD-10-CM

## 2021-06-05 DIAGNOSIS — M5441 Lumbago with sciatica, right side: Secondary | ICD-10-CM | POA: Insufficient documentation

## 2021-06-05 DIAGNOSIS — M961 Postlaminectomy syndrome, not elsewhere classified: Secondary | ICD-10-CM | POA: Diagnosis present

## 2021-06-05 DIAGNOSIS — M5442 Lumbago with sciatica, left side: Secondary | ICD-10-CM | POA: Insufficient documentation

## 2021-06-05 DIAGNOSIS — M79605 Pain in left leg: Secondary | ICD-10-CM | POA: Diagnosis present

## 2021-06-05 DIAGNOSIS — M5417 Radiculopathy, lumbosacral region: Secondary | ICD-10-CM | POA: Insufficient documentation

## 2021-06-05 DIAGNOSIS — G8929 Other chronic pain: Secondary | ICD-10-CM | POA: Insufficient documentation

## 2021-06-05 DIAGNOSIS — M79604 Pain in right leg: Secondary | ICD-10-CM | POA: Diagnosis present

## 2021-06-05 MED ORDER — SODIUM CHLORIDE (PF) 0.9 % IJ SOLN
INTRAMUSCULAR | Status: AC
  Filled 2021-06-05: qty 10

## 2021-06-05 MED ORDER — TRIAMCINOLONE ACETONIDE 40 MG/ML IJ SUSP
40.0000 mg | Freq: Once | INTRAMUSCULAR | Status: AC
Start: 2021-06-05 — End: 2021-06-05
  Administered 2021-06-05: 40 mg

## 2021-06-05 MED ORDER — IOHEXOL 180 MG/ML  SOLN
10.0000 mL | Freq: Once | INTRAMUSCULAR | Status: AC
  Administered 2021-06-05: 10 mL via EPIDURAL

## 2021-06-05 MED ORDER — MIDAZOLAM HCL 5 MG/5ML IJ SOLN
INTRAMUSCULAR | Status: AC
  Filled 2021-06-05: qty 5

## 2021-06-05 MED ORDER — SODIUM CHLORIDE 4 MEQ/ML IV SOLN
10.0000 mL | Freq: Once | INTRAVENOUS | Status: AC
  Administered 2021-06-05: 10 mL via EPIDURAL
  Filled 2021-06-05: qty 4.27

## 2021-06-05 MED ORDER — SODIUM CHLORIDE 0.9% FLUSH
4.0000 mL | Freq: Once | INTRAVENOUS | Status: AC
  Administered 2021-06-05: 4 mL

## 2021-06-05 MED ORDER — LIDOCAINE HCL (PF) 2 % IJ SOLN
INTRAMUSCULAR | Status: AC
  Filled 2021-06-05: qty 5

## 2021-06-05 MED ORDER — GENTAMICIN SULFATE 40 MG/ML IJ SOLN
120.0000 mg | Freq: Once | INTRAVENOUS | Status: AC
  Administered 2021-06-05: 120 mg via INTRAVENOUS
  Filled 2021-06-05: qty 3

## 2021-06-05 MED ORDER — LIDOCAINE HCL (PF) 2 % IJ SOLN
INTRAMUSCULAR | Status: AC
  Filled 2021-06-05: qty 15

## 2021-06-05 MED ORDER — MIDAZOLAM HCL 5 MG/5ML IJ SOLN
0.5000 mg | Freq: Once | INTRAMUSCULAR | Status: AC
  Administered 2021-06-05: 5 mg via INTRAVENOUS

## 2021-06-05 MED ORDER — HYALURONIDASE HUMAN 150 UNIT/ML IJ SOLN
1500.0000 [IU] | Freq: Once | INTRAMUSCULAR | Status: AC
  Administered 2021-06-05: 150 [IU] via INTRADERMAL
  Filled 2021-06-05: qty 10

## 2021-06-05 MED ORDER — FENTANYL CITRATE (PF) 100 MCG/2ML IJ SOLN
25.0000 ug | INTRAMUSCULAR | Status: DC | PRN
Start: 2021-06-05 — End: 2021-06-05
  Administered 2021-06-05: 100 ug via INTRAVENOUS

## 2021-06-05 MED ORDER — FENTANYL CITRATE (PF) 100 MCG/2ML IJ SOLN
INTRAMUSCULAR | Status: AC
  Filled 2021-06-05: qty 2

## 2021-06-05 MED ORDER — ROPIVACAINE HCL 2 MG/ML IJ SOLN
4.0000 mL | Freq: Once | INTRAMUSCULAR | Status: AC
Start: 2021-06-05 — End: 2021-06-05
  Administered 2021-06-05: 4 mL via EPIDURAL

## 2021-06-05 MED ORDER — ROPIVACAINE HCL 2 MG/ML IJ SOLN
INTRAMUSCULAR | Status: AC
  Filled 2021-06-05: qty 20

## 2021-06-05 MED ORDER — LIDOCAINE HCL 2 % IJ SOLN
20.0000 mL | Freq: Once | INTRAMUSCULAR | Status: AC
  Administered 2021-06-05: 400 mg

## 2021-06-05 MED ORDER — LACTATED RINGERS IV SOLN
1000.0000 mL | Freq: Once | INTRAVENOUS | Status: AC
Start: 2021-06-05 — End: 2021-06-05
  Administered 2021-06-05: 1000 mL via INTRAVENOUS

## 2021-06-05 MED ORDER — LIDOCAINE-EPINEPHRINE (PF) 2 %-1:200000 IJ SOLN
10.0000 mL | Freq: Once | INTRAMUSCULAR | Status: AC
  Administered 2021-06-05: 10 mL
  Filled 2021-06-05: qty 20

## 2021-06-05 MED ORDER — IOHEXOL 180 MG/ML  SOLN
INTRAMUSCULAR | Status: AC
  Filled 2021-06-05: qty 10

## 2021-06-05 MED ORDER — TRIAMCINOLONE ACETONIDE 40 MG/ML IJ SUSP
INTRAMUSCULAR | Status: AC
  Filled 2021-06-05: qty 1

## 2021-06-05 MED ORDER — KETOROLAC TROMETHAMINE 30 MG/ML IJ SOLN
30.0000 mg | Freq: Once | INTRAMUSCULAR | Status: AC
  Administered 2021-06-05: 30 mg via INTRAVENOUS

## 2021-06-05 NOTE — Progress Notes (Signed)
PROVIDER NOTE: Information contained herein reflects review and annotations entered in association with encounter. Interpretation of such information and data should be left to medically-trained personnel. Information provided to patient can be located elsewhere in the medical record under "Patient Instructions". Document created using STT-dictation technology, any transcriptional errors that may result from process are unintentional.    Patient: Derek [name]  Service Category: Procedure Provider: Gaspar Cola, MD DOB: [id number] DOS: 06/05/2021 Location: [facility] MRN: [id number] Setting: Ambulatory - outpatient Referring Provider: Jodi Marble, MD Type: Established Patient Specialty: Interventional Pain Management PCP: Jodi Marble, MD  Primary Reason for Visit: Interventional Pain Management Treatment. CC: Back Pain (lower)   Procedure:          Anesthesia, Analgesia, Anxiolysis:  Type: Therapeutic Percutaneous Epidural Neuroplasty and Lysis of Adhesions (RACZ Procedure)  #5 (last done 05/02/2020) Region: Caudal Level: Sacrococcygeal   Laterality: Midline aiming at the left (targeting the left S2, S3 area)  Anesthesia: Local (1-2% Lidocaine)  Anxiolysis: IV  Sedation: Minimal  Guidance: Fluoroscopy           Position: Prone   Indications: 1. Chronic low back pain (1ry area of Pain) (Bilateral) (L>R) w/ sciatica (Bilateral)   2. Chronic lower extremity pain (2ry area of Pain) (Bilateral) (L>R)   3. Epidural fibrosis   4. Failed back surgical syndrome (x3)   5. Radicular pain of lumbosacral region   6. Lumbosacral radiculopathy    Pain Score: Pre-procedure: 7 /10 Post-procedure: 2 /10     Pre-op H&P Assessment:  Mr. Benassi is a 40 y.o. (year old), male patient, seen today for interventional treatment. He  has a past surgical history that includes Back surgery; HAND REIMPLANTED; and Lumbar fusion (11/14). Mr. Boedecker has a current medication  list which includes the following prescription(s): diclofenac sodium, oxycodone hcl, [START ON 06/20/2021] oxycodone hcl, [START ON 07/20/2021] oxycodone hcl, meloxicam, and tizanidine, and the following Facility-Administered Medications: fentanyl. His primarily concern today is the Back Pain (lower)  Initial Vital Signs:  Pulse/HCG Rate: 94ECG Heart Rate: 90 Temp: (!) 97 F (36.1 C) Resp: 16 BP: 129/69 SpO2: 99 %  BMI: Estimated body mass index is 25.11 kg/m as calculated from the following:   Height as of this encounter: '5\' 10"'  (1.778 m).   Weight as of this encounter: 175 lb (79.4 kg).  Risk Assessment: Allergies: Reviewed. He is allergic to amoxicillin and vicodin [hydrocodone-acetaminophen].  Allergy Precautions: None required Coagulopathies: Reviewed. None identified.  Blood-thinner therapy: None at this time Active Infection(s): Reviewed. None identified. Mr. Quintero is afebrile  Site Confirmation: Mr. Davids was asked to confirm the procedure and laterality before marking the site Procedure checklist: Completed Consent: Before the procedure and under the influence of no sedative(s), amnesic(s), or anxiolytics, the patient was informed of the treatment options, risks and possible complications. To fulfill our ethical and legal obligations, as recommended by the American Medical Association's Code of Ethics, I have informed the patient of my clinical impression; the nature and purpose of the treatment or procedure; the risks, benefits, and possible complications of the intervention; the alternatives, including doing nothing; the risk(s) and benefit(s) of the alternative treatment(s) or procedure(s); and the risk(s) and benefit(s) of doing nothing. The patient was provided information about the general risks and possible complications associated with the procedure. These may include, but are not limited to: failure to achieve desired goals, infection, bleeding, organ or nerve damage,  allergic reactions, paralysis, and death. In addition,  the patient was informed of those risks and complications associated to Spine-related procedures, such as failure to decrease pain; infection (i.e.: Meningitis, epidural or intraspinal abscess); bleeding (i.e.: epidural hematoma, subarachnoid hemorrhage, or any other type of intraspinal or peri-dural bleeding); organ or nerve damage (i.e.: Any type of peripheral nerve, nerve root, or spinal cord injury) with subsequent damage to sensory, motor, and/or autonomic systems, resulting in permanent pain, numbness, and/or weakness of one or several areas of the body; allergic reactions; (i.e.: anaphylactic reaction); and/or death. Furthermore, the patient was informed of those risks and complications associated with the medications. These include, but are not limited to: allergic reactions (i.e.: anaphylactic or anaphylactoid reaction(s)); adrenal axis suppression; blood sugar elevation that in diabetics may result in ketoacidosis or comma; water retention that in patients with history of congestive heart failure may result in shortness of breath, pulmonary edema, and decompensation with resultant heart failure; weight gain; swelling or edema; medication-induced neural toxicity; particulate matter embolism and blood vessel occlusion with resultant organ, and/or nervous system infarction; and/or aseptic necrosis of one or more joints. Finally, the patient was informed that Medicine is not an exact science; therefore, there is also the possibility of unforeseen or unpredictable risks and/or possible complications that may result in a catastrophic outcome. The patient indicated having understood very clearly. We have given the patient no guarantees and we have made no promises. Enough time was given to the patient to ask questions, all of which were answered to the patient's satisfaction. Mr. Iovino has indicated that he wanted to continue with the  procedure. Attestation: I, the ordering provider, attest that I have discussed with the patient the benefits, risks, side-effects, alternatives, likelihood of achieving goals, and potential problems during recovery for the procedure that I have provided informed consent. Date  Time: 06/05/2021  8:36 AM  Pre-Procedure Preparation:  Monitoring: As per clinic protocol. Respiration, ETCO2, SpO2, BP, heart rate and rhythm monitor placed and checked for adequate function Safety Precautions: Patient was assessed for positional comfort and pressure points before starting the procedure. Time-out: I initiated and conducted the "Time-out" before starting the procedure, as per protocol. The patient was asked to participate by confirming the accuracy of the "Time Out" information. Verification of the correct person, site, and procedure were performed and confirmed by me, the nursing staff, and the patient. "Time-out" conducted as per Joint Commission's Universal Protocol (UP.01.01.01). Time: 1008  Description of Procedure:          Target Area: Caudal Epidural Canal Approach: Midline approach Area Prepped: Entire Posterior Sacrococcygeal Region DuraPrep (Iodine Povacrylex [0.7% available iodine] and Isopropyl Alcohol, 74% w/w) Safety Precautions: Aspiration looking for blood return was conducted prior to all injections. At no point did I inject any substances, as a needle was being advanced. No attempts were made at seeking any paresthesias. Safe injection practices and needle disposal techniques used. Medications properly checked for expiration dates. SDV (single dose vial) medications used. Description of the Procedure: Protocol guidelines were followed. The patient was placed in position over the fluoroscopy table. Patient assessed for comfort and pressure points before starting procedure. The target area was identified and the area prepped in the usual manner. Skin & deeper tissues infiltrated with local  anesthetic. Appropriate amount of time allowed to pass for local anesthetics to take effect. The epidural needle was then advanced to the target area, via the sacral hiatus, between the sacral cornu. Proper needle placement secured. Negative aspiration confirmed. Step 1: Epidurogram performed by slowly  injecting a non-ionic, water-soluble, hypoallergenic, myelogram-compatible radiological contrast. Defect identified and Racz catheter advanced slowly next to rest proximal to it without attempting to perforate or puncture the defect. At this point, the epidural needle was removed. Step 2: Contrast was again injected, this time thru the catheter, under live fluoroscopy, closely observing for vascular uptake or intrathecal spread. Step 3: Once no vascular uptake or intrathecal spread was confirmed, a 2 mL test-dose using 2% PF-Lidocaine with 1:200,000 Epinephrine was injected thru the catheter, while closely monitoring for an increase in heart rate or evidence of spinal anesthesia. Step 4: After waiting over 5 minutes, the patient was assessed for evidence of a spinal block. Step 5: Once I had confirmed that there was no vascular uptake or evidence of intrathecal spread, I then slowly injected 1,500 Units of hyaluronidase, carefully monitoring for allergic reactions. I then waited 10 minutes, using this time to secure the catheter using a sterile benzoin tincture swab and (1m x 100 mm) steri-strip. After confirming vitals to be stable, the patient was transferred to the recovery area where Mr. JGopaulwas kept under constant observation and monitored as per post-sedation protocol. Step 6: After the 10 minutes, I proceeded to slowly inject a solution containing 0.2% MPF-Ropivacaine (4 mL) + 0.9% PF-NSS (5 mL) + SDV Triamcinolone 40 mg/mL (1 mL), in intermittent fashion, asking for systemic symptoms every 0.5cc of injectate. Step 7:  30 minutes later, a neurological exam was conducted for any evidence of a spinal  blockade. Step 8:  After confirming the absence of anesthesia, I slowly injected the 10% PF-Hypertonic Saline, stopping to assess, any time the patient described any discomfort. Once the procedure was completed, I removed the catheter, taking time to show the patient its spring tip, and demonstrating to the patient that none had been left behind. EBL: None Materials & Medications used:  1. Racz Tun-L-Kath (Epimed, GTolna [place] Catheter. (or similar)(Perifix 20Gx100cm) 2. 2" Foam Tape 3. Benzoin tincture swab 4. Steri-Strip (144mx 100 mm) 5. Non-occlusive Transparent Dressing (3MT TegadermT) 6. Bacteriostatic Filter for Epidural Catheter 7. Epidural Kit for 20G catheter Needle(s) used: 20g - 10cm, Tuohy-style epidural needle   Medications used:  1. Skin infiltration: 2.0% Lidocaine (1029m2. Test-dose: 1.5 % Lidocaine w/ Epi 1:200,000 (5ml44m. Epidural Steroid injection:  A). Steroid: Triamcinolone 40 mg/mL (SDV) (1ml)36m. Local Anesthetic: 0.2% PF-Ropivacaine (2 mg/mL) (4 mL) C). Dilution agent: 0.9% PF-NSS (injectable saline) (5 mL) 6. Neurolytic Agent: 10% PF-Sodium Chloride (Hypertonic Saline) (10ml)74m.4% PF-Sodium Chloride (4.273mL) 59m-Sterile H2O (5.727mL) =52m PF-Sodium Chloride (10mL)] 719mar softening agent: Hyaluronidase (150 units/mL) x (10 mL) = 1500 Units 8. Radiological Contrast Media: Isovue-M 200 (10 mL)  Vitals:   06/05/21 1127 06/05/21 1138 06/05/21 1149 06/05/21 1154  BP: 134/77 (!) 141/76 139/84 (!) 137/92  Pulse:      Resp: '20 15 15 17  ' Temp:      TempSrc:      SpO2: 98% 99% 97% 100%  Weight:      Height:        Start Time: 1008 hrs. End Time: 1034 hrs.  Epidurogram:  Epidurogram flow pattern demonstrated a restricted low at the level of the surgery. The epidural catheter was placed next to this restriction without attempting to perforate it.  See below. Materials:  Needle(s) Type: Epidural needle Gauge: 20G Length: 3.5-in Medication(s):  Please see orders for medications and dosing details.  Imaging Guidance (Spinal):  Type of Imaging Technique: Fluoroscopy Guidance (Spinal) Indication(s): Assistance in needle guidance and placement for procedures requiring needle placement in or near specific anatomical locations not easily accessible without such assistance. Exposure Time: Please see nurses notes. Contrast: Before injecting any contrast, we confirmed that the patient did not have an allergy to iodine, shellfish, or radiological contrast. Once satisfactory needle placement was completed at the desired level, radiological contrast was injected. Contrast injected under live fluoroscopy. No contrast complications. See chart for type and volume of contrast used. Fluoroscopic Guidance: I was personally present during the use of fluoroscopy. "Tunnel Vision Technique" used to obtain the best possible view of the target area. Parallax error corrected before commencing the procedure. "Direction-depth-direction" technique used to introduce the needle under continuous pulsed fluoroscopy. Once target was reached, antero-posterior, oblique, and lateral fluoroscopic projection used confirm needle placement in all planes. Images permanently stored in EMR.              Interpretation: I personally interpreted the imaging intraoperatively. Adequate needle placement confirmed in multiple planes. Appropriate spread of contrast into desired area was observed. No evidence of afferent or efferent intravascular uptake. No intrathecal or subarachnoid spread observed. Permanent images saved into the patient's record.  Antibiotic Prophylaxis:   Anti-infectives (From admission, onward)    Start     Dose/Rate Route Frequency Ordered Stop   06/05/21 0930  gentamicin (GARAMYCIN) 120 mg in dextrose 5 % 50 mL IVPB        120 mg 106 mL/hr over 30 Minutes Intravenous  Once 06/05/21 0830 06/05/21 0933      Indication(s): None  identified  Post-operative Assessment:  Post-procedure Vital Signs:  Pulse/HCG Rate: 9487 Temp: (!) 97 F (36.1 C) Resp: 17 BP: (!) 137/92 SpO2: 100 %  EBL: None  Complications: No immediate post-treatment complications observed by team, or reported by patient.  Note: The patient tolerated the entire procedure well. A repeat set of vitals were taken after the procedure and the patient was kept under observation following institutional policy, for this type of procedure. Post-procedural neurological assessment was performed, showing return to baseline, prior to discharge. The patient was provided with post-procedure discharge instructions, including a section on how to identify potential problems. Should any problems arise concerning this procedure, the patient was given instructions to immediately contact us, at any time, without hesitation. In any case, we plan to contact the patient by telephone for a follow-up status report regarding this interventional procedure.  Comments:  No additional relevant information.  Plan of Care  Orders:  Orders Placed This Encounter  Procedures   Racz (One Day)    Equipment: Epidural Tray; Racz Catheter; 10% Hypertonic Saline; Hyaluronidase; 3/4" Steri-strips; 4x4 Sterile Gauze pack.    Scheduling Instructions:     Procedure: RACZ Epidural Lysis of Adhesions     Side: Midline     Sedation: Moderate Conscious Sedation     Timeframe: Today   DG PAIN [facility] C-ARM 1-60 MIN NO REPORT    Intraoperative interpretation by procedural physician at [facility].    Standing Status:   Standing    Number of Occurrences:   1    Order Specific Question:   Reason for exam:    Answer:   Assistance in needle guidance and placement for procedures requiring needle placement in or near specific anatomical locations not easily accessible without such assistance.   Informed Consent Details: Physician/Practitioner Attestation; Transcribe to consent form and  obtain patient signature  Nursing Order: Transcribe to consent form and obtain patient signature. Note: Always confirm laterality of pain with Mr. Merica, before procedure.    Order Specific Question:   Physician/Practitioner attestation of informed consent for procedure/surgical case    Answer:   I, the physician/practitioner, attest that I have discussed with the patient the benefits, risks, side effects, alternatives, likelihood of achieving goals and potential problems during recovery for the procedure that I have provided informed consent.    Order Specific Question:   Procedure    Answer:   RACZ Procedure (Epidural Lysis of Adhesions)    Order Specific Question:   Physician/Practitioner performing the procedure    Answer:   Said Rueb A. Dossie Arbour, MD    Order Specific Question:   Indication/Reason    Answer:   Chronic low back pain and lower extremity pain secondary to epidural fibrosis associated with a failed back surgical syndrome   Provide equipment / supplies at bedside    "Epidural Tray" (Disposable  single use) Catheter: NOT required    Standing Status:   Standing    Number of Occurrences:   1    Order Specific Question:   Specify    Answer:   Epidural Tray    Chronic Opioid Analgesic:  Oxycodone IR 10 mg, 1 tab PO q 6 hrs (40 mg/day of oxycodone) MME/day: 60 mg/day.   Medications ordered for procedure: Meds ordered this encounter  Medications   iohexol (OMNIPAQUE) 180 MG/ML injection 10 mL    Must be Myelogram-compatible. If not available, you may substitute with a water-soluble, non-ionic, hypoallergenic, myelogram-compatible radiological contrast medium.   lidocaine (XYLOCAINE) 2 % (with pres) injection 400 mg   lactated ringers infusion 1,000 mL   midazolam (VERSED) 5 MG/5ML injection 0.5-2 mg    Make sure Flumazenil is available in the pyxis when using this medication. If oversedation occurs, administer 0.2 mg IV over 15 sec. If after 45 sec no response, administer  0.2 mg again over 1 min; may repeat at 1 min intervals; not to exceed 4 doses (1 mg)   fentaNYL (SUBLIMAZE) injection 25-50 mcg    Make sure Narcan is available in the pyxis when using this medication. In the event of respiratory depression (RR< 8/min): Titrate NARCAN (naloxone) in increments of 0.1 to 0.2 mg IV at 2-3 minute intervals, until desired degree of reversal.   lidocaine-EPINEPHrine (XYLOCAINE W/EPI) 2 %-1:200000 (PF) injection 10 mL    If 2% is unavailable, may be substituted with 1.5%, but must be preservative-free. If 1:200,000 concentration of epinephrine is not available, it may be substituted with 1:100,000. Notify physician of changes, before procedure.   ropivacaine (PF) 2 mg/mL (0.2%) (NAROPIN) injection 4 mL   triamcinolone acetonide (KENALOG-40) injection 40 mg   hyaluronidase Human (HYLENEX) injection 1,500 Units    10 mL of the 150 Units/mL   sodium chloride flush (NS) 0.9 % injection 4 mL   sodium chloride hypertonic 10% epidural injection    For Racz Epidural Lysis of Adhesions.   gentamicin (GARAMYCIN) 120 mg in dextrose 5 % 50 mL IVPB    Order Specific Question:   Antibiotic Indication:    Answer:   Surgical Prophylaxis   ketorolac (TORADOL) 30 MG/ML injection 30 mg    Medications administered: We administered iohexol, lidocaine, lactated ringers, midazolam, fentaNYL, lidocaine-EPINEPHrine, ropivacaine (PF) 2 mg/mL (0.2%), triamcinolone acetonide, hyaluronidase Human, sodium chloride flush, sodium chloride hypertonic 10% epidural injection, gentamicin (GARAMYCIN) 120 mg in dextrose 5 % 50 mL IVPB, and ketorolac.  See the medical record for exact dosing, route, and time of administration.  Follow-up plan:   Return in about 2 weeks (around 06/19/2021) for Proc-day (T,Th), (VV), (PPE).     Interventional Therapies  Risk  Complexity Considerations:   Estimated body mass index is 24.54 kg/m as calculated from the following:   Height as of this encounter: '5\' 10"'   (1.778 m).   Weight as of this encounter: 171 lb (77.6 kg). WNL   Planned  Pending:   Pending further evaluation   Under consideration:   Diagnostic bilateral lumbar facet block #1  Possible bilateral lumbar facet RFA  Diagnostic bilateral sacroiliac joint block #1  Possible bilateral sacroiliac joint RFA  Possible spinal cord stimulator trial    Completed:   Therapeutic right L2-3 LESI x1 (08/29/2020)  Diagnostic left lumbar facet MBB x1 (03/21/2020)  Diagnostic left SI block x1 (03/21/2020)  Therapeutic/palliative right lateral epicondyle (elbow) injection x2 (01/21/2021)  Therapeutic/palliative left Racz procedure x6 (targeting the left S2, S3 area) (06/05/2021)   Therapeutic  Palliative (PRN) options:   Palliative right lateral epicondyle (elbow) injection #2  Palliative left Racz procedure #7 (targeting the left S2, S3 area)      Recent Visits Date Type Provider Dept  05/21/21 Office Visit Milinda Pointer, MD [facility]  Showing recent visits within past 90 days and meeting all other requirements Today's Visits Date Type Provider Dept  06/05/21 Procedure visit Milinda Pointer, MD [facility]  Showing today's visits and meeting all other requirements Future Appointments Date Type Provider Dept  06/24/21 Appointment Milinda Pointer, [facility]  08/13/21 Appointment Milinda Pointer, MD [facility]  Showing future appointments within next 90 days and meeting all other requirements Disposition: Discharge home  Discharge (Date  Time): 06/05/2021; 1205 hrs.   Primary Care Physician: Jodi Marble, MD Location: [facility] Note by: Gaspar Cola, MD Date: 06/05/2021; Time: 2:31 PM  Disclaimer:  Medicine is not an Chief Strategy Officer. The only guarantee in medicine is that nothing is guaranteed. It is important to note that the decision to proceed with this intervention was based on the  information collected from the patient. The Data and conclusions were drawn from the patient's questionnaire, the interview, and the physical examination. Because the information was provided in large part by the patient, it cannot be guaranteed that it has not been purposely or unconsciously manipulated. Every effort has been made to obtain as much relevant data as possible for this evaluation. It is important to note that the conclusions that lead to this procedure are derived in large part from the available data. Always take into account that the treatment will also be dependent on availability of resources and existing treatment guidelines, considered by other Pain Management Practitioners as being common knowledge and practice, at the time of the intervention. For Medico-Legal purposes, it is also important to point out that variation in procedural techniques and pharmacological choices are the acceptable norm. The indications, contraindications, technique, and results of the above procedure should only be interpreted and judged by a Board-Certified Interventional Pain Specialist with extensive familiarity and expertise in the same exact procedure and technique.

## 2021-06-05 NOTE — Progress Notes (Signed)
Safety precautions to be maintained throughout the outpatient stay will include: orient to surroundings, keep bed in low position, maintain call bell within reach at all times, provide assistance with transfer out of bed and ambulation.  

## 2021-06-05 NOTE — Patient Instructions (Signed)

## 2021-06-06 ENCOUNTER — Telehealth: Payer: Self-pay

## 2021-06-06 NOTE — Telephone Encounter (Signed)
Called PP. States he is doing OK today. Instructed to call if needed.

## 2021-06-24 ENCOUNTER — Ambulatory Visit: Payer: BC Managed Care – PPO | Attending: Pain Medicine | Admitting: Pain Medicine

## 2021-06-24 ENCOUNTER — Other Ambulatory Visit: Payer: Self-pay

## 2021-06-24 DIAGNOSIS — M5441 Lumbago with sciatica, right side: Secondary | ICD-10-CM

## 2021-06-24 DIAGNOSIS — M5442 Lumbago with sciatica, left side: Secondary | ICD-10-CM | POA: Diagnosis not present

## 2021-06-24 DIAGNOSIS — M79605 Pain in left leg: Secondary | ICD-10-CM

## 2021-06-24 DIAGNOSIS — G894 Chronic pain syndrome: Secondary | ICD-10-CM

## 2021-06-24 DIAGNOSIS — M5417 Radiculopathy, lumbosacral region: Secondary | ICD-10-CM

## 2021-06-24 DIAGNOSIS — G96198 Other disorders of meninges, not elsewhere classified: Secondary | ICD-10-CM | POA: Diagnosis not present

## 2021-06-24 DIAGNOSIS — G8929 Other chronic pain: Secondary | ICD-10-CM

## 2021-06-24 DIAGNOSIS — M79604 Pain in right leg: Secondary | ICD-10-CM

## 2021-06-24 DIAGNOSIS — M961 Postlaminectomy syndrome, not elsewhere classified: Secondary | ICD-10-CM

## 2021-06-24 NOTE — Progress Notes (Signed)
Patient: Derek [name]  Service Category: E/M  Provider: Gaspar Cola, MD  DOB: 04/08/1981  DOS: 06/24/2021  Location: Office  MRN: [id number]  Setting: Ambulatory outpatient  Referring Provider: Jodi Marble, MD  Type: Established Patient  Specialty: Interventional Pain Management  PCP: Jodi Marble, MD  Location: Remote location  Delivery: TeleHealth     Virtual Encounter - Pain Management PROVIDER NOTE: Information contained herein reflects review and annotations entered in association with encounter. Interpretation of such information and data should be left to medically-trained personnel. Information provided to patient can be located elsewhere in the medical record under "Patient Instructions". Document created using STT-dictation technology, any transcriptional errors that may result from process are unintentional.    Contact & Pharmacy Preferred: [phone] Home: [phone] (home) Mobile: [phone] (mobile) E-mail: [name]  Derek [name], [place] [address] Derek [name] [place] [address] Phone: [phone] Fax: [phone]   Pre-screening  Derek [name] offered "in-person" vs "virtual" encounter. He indicated preferring virtual for this encounter.   Reason COVID-19*   Social distancing based on CDC and AMA recommendations.   I contacted Derek [name] on 06/24/2021 via telephone.      I clearly identified myself as Gaspar Cola, MD. I verified that I was speaking with the correct person using two identifiers (Name: Derek [name], and date of birth: 03-12-1981).  Consent I sought verbal advanced consent from Derek [name] for virtual visit interactions. I informed Derek [name] of possible security and privacy concerns, risks, and limitations associated with providing "not-in-person" medical evaluation and management services. I also informed Derek [name] of the availability of "in-person" appointments. Finally,  I informed him that there would be a charge for the virtual visit and that he could be  personally, fully or partially, financially responsible for it. Derek [name] expressed understanding and agreed to proceed.   Historic Elements   Derek [name] is a 40 y.o. year old, male patient evaluated today after our last contact on 06/05/2021. Derek [name]  has a past medical history of Anxiety, Arthritis, and GERD (gastroesophageal reflux disease). He also  has a past surgical history that includes Back surgery; HAND REIMPLANTED; and Lumbar fusion (11/14). Derek [name] has a current medication list which includes the following prescription(s): diclofenac sodium, meloxicam, oxycodone hcl, oxycodone hcl, [START ON 07/20/2021] oxycodone hcl, and tizanidine. He  reports that he has been smoking cigarettes. He has a 16.00 pack-year smoking history. He has never used smokeless tobacco. He reports current alcohol use. He reports that he does not use drugs. Derek [name] is allergic to amoxicillin and vicodin [hydrocodone-acetaminophen].   HPI  Today, he is being contacted for a post-procedure assessment.  The patient seems to again have done extremely well with the Racz procedure.  Right now he feels that there is very little pain left and it is in the buttocks area but his leg pain is now completely gone.  Post-procedure evaluation     Procedure:          Anesthesia, Analgesia, Anxiolysis:  Type: Therapeutic Percutaneous Epidural Neuroplasty and Lysis of Adhesions (RACZ Procedure)  #5 (last done 05/02/2020) Region: Caudal Level: Sacrococcygeal   Laterality: Midline aiming at the left (targeting the left S2, S3 area)  Anesthesia: Local (1-2% Lidocaine)  Anxiolysis: IV  Sedation: Minimal  Guidance: Fluoroscopy           Position: Prone   Indications: 1. Chronic low  back pain (1ry area of Pain) (Bilateral) (L>R) w/ sciatica (Bilateral)   2. Chronic lower extremity pain (2ry area of Pain) (Bilateral) (L>R)   3.  Epidural fibrosis   4. Failed back surgical syndrome (x3)   5. Radicular pain of lumbosacral region   6. Lumbosacral radiculopathy    Pain Score: Pre-procedure: 7 /10 Post-procedure: 2 /10    Effectiveness:  Initial hour after procedure: 0 %. Subsequent 4-6 hours post-procedure: 0 %. Analgesia past initial 6 hours: 90 % (very sore for a few days after.  pain that was going down legs is much better.). Ongoing improvement:  Analgesic: The patient indicates currently enjoying a 90% relief of his lower extremity pain.  He still has a little bit of pain in the buttocks area, but nothing like it was before.  He refers that these procedures usually well provide him with good relief of the pain that may last anywhere from 3 to 6 months depending on how much work he is doing. Function: Derek [name] reports improvement in function ROM: Derek [name] reports improvement in ROM  Pharmacotherapy Assessment   Opioid Analgesic: Oxycodone IR 10 mg, 1 tab PO q 6 hrs (40 mg/day of oxycodone) MME/day: 60 mg/day.   Monitoring: [place] PMP: PDMP not reviewed this encounter.       Pharmacotherapy: No side-effects or adverse reactions reported. Compliance: No problems identified. Effectiveness: Clinically acceptable. Plan: Refer to "POC". UDS:  Summary  Date Value Ref Range Status  02/19/2021 Note  Final    Comment:    ==================================================================== [employer] [facility] 13 (MW) ==================================================================== Test                             Result       Flag       Units  Drug Present and Declared for Prescription Verification   Oxycodone                      729          EXPECTED   ng/mg creat   Oxymorphone                    3202         EXPECTED   ng/mg creat   Noroxycodone                   1253         EXPECTED   ng/mg creat   Noroxymorphone                 627          EXPECTED   ng/mg creat    Sources of oxycodone are scheduled  prescription medications.    Oxymorphone, noroxycodone, and noroxymorphone are expected    metabolites of oxycodone. Oxymorphone is also available as a    scheduled prescription medication.  ==================================================================== Test                      Result    Flag   Units      Ref Range   Creatinine              45               mg/dL      >=20 ==================================================================== Declared Medications:  The flagging and interpretation on this report are based on the  following declared  medications.  Unexpected results may arise from  inaccuracies in the declared medications.   **Note: The testing scope of this panel includes these medications:   Oxycodone   **Note: The testing scope of this panel does not include the  following reported medications:   Calcium  Cyclobenzaprine (Flexeril)  Meloxicam (Mobic)  Tizanidine (Zanaflex)  Topical Diclofenac  Vitamin D ==================================================================== For clinical consultation, please call [phone]. ====================================================================      Laboratory Chemistry Profile   Renal Lab Results  Component Value Date   BUN 11 01/16/2019   CREATININE 1.08 01/16/2019   BCR 10 01/16/2019   GFRAA 99 01/16/2019   GFRNONAA 86 01/16/2019    Hepatic Lab Results  Component Value Date   AST 24 01/16/2019   ALBUMIN 4.6 01/16/2019   ALKPHOS 108 01/16/2019    Electrolytes Lab Results  Component Value Date   NA 143 01/16/2019   K 4.0 01/16/2019   CL 105 01/16/2019   CALCIUM 10.2 01/16/2019   MG 2.0 01/16/2019    Bone Lab Results  Component Value Date   25OHVITD1 39 01/16/2019   25OHVITD2 1.2 01/16/2019   25OHVITD3 38 01/16/2019    Inflammation (CRP: Acute Phase) (ESR: Chronic Phase) Lab Results  Component Value Date   CRP 4 01/16/2019   ESRSEDRATE 29 (H) 01/16/2019         Note: Above  Lab results reviewed.  Imaging  DG PAIN CLINIC C-ARM 1-60 MIN NO REPORT Fluoro was used, but no Radiologist interpretation will be provided.  Please refer to "NOTES" tab for provider progress note.  Assessment  The primary encounter diagnosis was Chronic lower extremity pain (2ry area of Pain) (Bilateral) (L>R). Diagnoses of Chronic low back pain (1ry area of Pain) (Bilateral) (L>R) w/ sciatica (Bilateral), Radicular pain of lumbosacral region, Epidural fibrosis, Failed back surgical syndrome (x3), and Chronic pain syndrome were also pertinent to this visit.  Plan of Care  Problem-specific:  No problem-specific Assessment & Plan notes found for this encounter.  Mr. HAGEN [name] has a current medication list which includes the following long-term medication(s): meloxicam, oxycodone hcl, oxycodone hcl, [START ON 07/20/2021] oxycodone hcl, and tizanidine.  Pharmacotherapy (Medications Ordered): No orders of the defined types were placed in this encounter.  Orders:  No orders of the defined types were placed in this encounter.  Follow-up plan:   No follow-ups on file.     Interventional Therapies  Risk   Complexity Considerations:   Estimated body mass index is 24.54 kg/m as calculated from the following:   Height as of this encounter: 5' 10" (1.778 m).   Weight as of this encounter: 171 lb (77.6 kg). WNL   Planned   Pending:   Pending further evaluation   Under consideration:   Diagnostic bilateral lumbar facet block #1  Possible bilateral lumbar facet RFA  Diagnostic bilateral sacroiliac joint block #1  Possible bilateral sacroiliac joint RFA  Possible spinal cord stimulator trial    Completed:   Therapeutic right L2-3 LESI x1 (08/29/2020)  Diagnostic left lumbar facet MBB x1 (03/21/2020)  Diagnostic left SI block x1 (03/21/2020)  Therapeutic/palliative right lateral epicondyle (elbow) injection x2 (01/21/2021)  Therapeutic/palliative left Racz procedure x6 (targeting the  left S2, S3 area) (06/05/2021)   Therapeutic   Palliative (PRN) options:   Palliative right lateral epicondyle (elbow) injection #2  Palliative left Racz procedure #7 (targeting the left S2, S3 area)      Recent Visits Date Type Provider Dept  06/05/21 Procedure visit  Milinda Pointer, MD [facility]  05/21/21 Office Visit Milinda Pointer, MD [facility]  Showing recent visits within past 90 days and meeting all other requirements Today's Visits Date Type Provider Dept  06/24/21 Office Visit Milinda Pointer, MD [facility]  Showing today's visits and meeting all other requirements Future Appointments Date Type Provider Dept  08/13/21 Appointment Milinda Pointer, MD [facility]  Showing future appointments within next 90 days and meeting all other requirements  I discussed the assessment and treatment plan with the patient. The patient was provided an opportunity to ask questions and all were answered. The patient agreed with the plan and demonstrated an understanding of the instructions.  Patient advised to call back or seek an in-person evaluation if the symptoms or condition worsens.  Duration of encounter: 15 minutes.  Note by: Gaspar Cola, MD Date: 06/24/2021; Time: 8:41 AM

## 2021-08-12 NOTE — Progress Notes (Signed)
PROVIDER NOTE: Information contained herein reflects review and annotations entered in association with encounter. Interpretation of such information and data should be left to medically-trained personnel. Information provided to patient can be located elsewhere in the medical record under "Patient Instructions". Document created using STT-dictation technology, any transcriptional errors that may result from process are unintentional.    Patient: Derek [name]  Service Category: E/M  Provider: Gaspar Cola, MD  DOB: Dec 10, 1980  DOS: 08/13/2021  Specialty: Interventional Pain Management  MRN: [id number]  Setting: Ambulatory outpatient  PCP: Jodi Marble, MD  Type: Established Patient    Referring Provider: Jodi Marble, MD  Location: Office  Delivery: Face-to-face     HPI  [name], a 41 y.o. year old male, is here today because of his Chronic pain syndrome [G89.4]. Mr. Stettler primary complain today is Back Pain (Mid - lower back bilateral ) Last encounter: My last encounter with him was on 06/24/2021. Pertinent problems: Mr. Seeling has Lumbar pseudoarthrosis (L5-S1); Chronic low back pain (1ry area of Pain) (Bilateral) (L>R) w/ sciatica (Bilateral); Chronic lower extremity pain (2ry area of Pain) (Bilateral) (L>R); Chronic pain syndrome; Failed back surgical syndrome (x3); L5-S1 pseudoarthrosis; Chronic musculoskeletal pain; Spasm of back muscles; Sacroiliac joint dysfunction (Bilateral); Chronic sacroiliac joint pain (Bilateral) (L>R); Somatic dysfunction of sacroiliac joints (Bilateral); Chronic hip pain (Bilateral); Epidural fibrosis; Lumbar postlaminectomy syndrome; DDD (degenerative disc disease), lumbosacral; Lumbar facet syndrome (Bilateral) (L>R); Other specified dorsopathies, sacral and sacrococcygeal region; Spondylosis without myelopathy or radiculopathy, lumbosacral region; Neurogenic pain; Osteoarthritic spondylosis of lumbar spine; Tendinitis of elbows (Bilateral);  Lateral epicondylitis of elbows (Bilateral); Chronic elbow pain (Right); Chronic low back pain (Left) w/o sciatica; Numbness of anterior thigh (Right); Burning pain in thigh (Right); Chronic low back pain (Midline) w/o sciatica; Acute exacerbation of chronic low back pain; Chronic elbow pain (Bilateral); Radial collateral ligament sprain of elbow, unspecified laterality, sequela (Bilateral); Enthesopathy of elbow region (Bilateral); Sprain of lateral collateral ligament of elbow, sequela (Left); Sprain of lateral collateral ligament of elbow, sequela (Right); Radicular pain of lumbosacral region; and Lumbosacral radiculopathy on their pertinent problem list. Pain Assessment: Severity of Chronic pain is reported as a 4 /10. Location: Back Left, Right, Mid, Lower/into both hips and sometimes legs. Onset: More than a month ago. Quality: Discomfort, Constant, Dull, Aching, Sharp. Timing: Constant. Modifying factor(s): medications helps sometimes, procedures. Vitals:  height is '5\' 10"'  (1.778 m) and weight is 175 lb (79.4 kg). His temporal temperature is 97.2 F (36.2 C) (abnormal). His blood pressure is 131/80 and his pulse is 98. His respiration is 16 and oxygen saturation is 98%.   Reason for encounter: medication management.   The patient indicates doing well with the current medication regimen. No adverse reactions or side effects reported to the medications.   RTCB: 11/17/2021 Nonopioids transferred 05/02/2020: Zanaflex, Flexeril, and Mobic  Pharmacotherapy Assessment  Analgesic: Oxycodone IR 10 mg, 1 tab PO q 6 hrs (40 mg/day of oxycodone) MME/day: 60 mg/day.   Monitoring: [place] PMP: PDMP reviewed during this encounter.       Pharmacotherapy: No side-effects or adverse reactions reported. Compliance: No problems identified. Effectiveness: Clinically acceptable.  Janett Billow, RN  08/13/2021  8:19 AM  Sign when Signing Visit Nursing Pain Medication Assessment:  Safety precautions to be  maintained throughout the outpatient stay will include: orient to surroundings, keep bed in low position, maintain call bell within reach at all times, provide assistance with transfer out of bed and ambulation.  Medication Inspection Compliance: Pill count conducted under aseptic conditions, in front of the patient. Neither the pills nor the bottle was removed from the patient's sight at any time. Once count was completed pills were immediately returned to the patient in their original bottle.  Medication: Oxycodone IR Pill/Patch Count:  19 of 120 pills remain Pill/Patch Appearance: Markings consistent with prescribed medication Bottle Appearance: Standard pharmacy container. Clearly labeled. Filled Date: 01 / 14 / 2023 Last Medication intake:  Today    UDS:  Summary  Date Value Ref Range Status  02/19/2021 Note  Final    Comment:    ==================================================================== [facility] Select 13 (MW) ==================================================================== Test                             Result       Flag       Units  Drug Present and Declared for Prescription Verification   Oxycodone                      729          EXPECTED   ng/mg creat   Oxymorphone                    3202         EXPECTED   ng/mg creat   Noroxycodone                   1253         EXPECTED   ng/mg creat   Noroxymorphone                 627          EXPECTED   ng/mg creat    Sources of oxycodone are scheduled prescription medications.    Oxymorphone, noroxycodone, and noroxymorphone are expected    metabolites of oxycodone. Oxymorphone is also available as a    scheduled prescription medication.  ==================================================================== Test                      Result    Flag   Units      Ref Range   Creatinine              45               mg/dL      >=20 ==================================================================== Declared  Medications:  The flagging and interpretation on this report are based on the  following declared medications.  Unexpected results may arise from  inaccuracies in the declared medications.   **Note: The testing scope of this panel includes these medications:   Oxycodone   **Note: The testing scope of this panel does not include the  following reported medications:   Calcium  Cyclobenzaprine (Flexeril)  Meloxicam (Mobic)  Tizanidine (Zanaflex)  Topical Diclofenac  Vitamin D ==================================================================== For clinical consultation, please call [phone]. ====================================================================      ROS  Constitutional: Denies any fever or chills Gastrointestinal: No reported hemesis, hematochezia, vomiting, or acute GI distress Musculoskeletal: Denies any acute onset joint swelling, redness, loss of ROM, or weakness Neurological: No reported episodes of acute onset apraxia, aphasia, dysarthria, agnosia, amnesia, paralysis, loss of coordination, or loss of consciousness  Medication Review  Diclofenac Sodium, Oxycodone HCl, meloxicam, and tiZANidine  History Review  Allergy: Mr. Schreier is allergic to amoxicillin and vicodin [hydrocodone-acetaminophen]. Drug: Mr. Vorndran  reports no  history of drug use. Alcohol:  reports current alcohol use. Tobacco:  reports that he has been smoking cigarettes. He has a 16.00 pack-year smoking history. He has never used smokeless tobacco. Social: Mr. Dible  reports that he has been smoking cigarettes. He has a 16.00 pack-year smoking history. He has never used smokeless tobacco. He reports current alcohol use. He reports that he does not use drugs. Medical:  has a past medical history of Anxiety, Arthritis, and GERD (gastroesophageal reflux disease). Surgical: Mr. Stumpe  has a past surgical history that includes Back surgery; HAND REIMPLANTED; and Lumbar fusion (11/14). Family:  family history is not on file.  Laboratory Chemistry Profile   Renal Lab Results  Component Value Date   BUN 11 01/16/2019   CREATININE 1.08 01/16/2019   BCR 10 01/16/2019   GFRAA 99 01/16/2019   GFRNONAA 86 01/16/2019    Hepatic Lab Results  Component Value Date   AST 24 01/16/2019   ALBUMIN 4.6 01/16/2019   ALKPHOS 108 01/16/2019    Electrolytes Lab Results  Component Value Date   NA 143 01/16/2019   K 4.0 01/16/2019   CL 105 01/16/2019   CALCIUM 10.2 01/16/2019   MG 2.0 01/16/2019    Bone Lab Results  Component Value Date   25OHVITD1 39 01/16/2019   25OHVITD2 1.2 01/16/2019   25OHVITD3 38 01/16/2019    Inflammation (CRP: Acute Phase) (ESR: Chronic Phase) Lab Results  Component Value Date   CRP 4 01/16/2019   ESRSEDRATE 29 (H) 01/16/2019         Note: Above Lab results reviewed.  Recent Imaging Review  DG PAIN CLINIC C-ARM 1-60 MIN NO REPORT Fluoro was used, but no Radiologist interpretation will be provided.  Please refer to "NOTES" tab for provider progress note. Note: Reviewed        Physical Exam  General appearance: Well nourished, well developed, and well hydrated. In no apparent acute distress Mental status: Alert, oriented x 3 (person, place, & time)       Respiratory: No evidence of acute respiratory distress Eyes: PERLA Vitals: BP 131/80 (BP Location: Right Arm, Patient Position: Sitting, Cuff Size: Normal)    Pulse 98    Temp (!) 97.2 F (36.2 C) (Temporal)    Resp 16    Ht '5\' 10"'  (1.778 m)    Wt 175 lb (79.4 kg)    SpO2 98%    BMI 25.11 kg/m  BMI: Estimated body mass index is 25.11 kg/m as calculated from the following:   Height as of this encounter: '5\' 10"'  (1.778 m).   Weight as of this encounter: 175 lb (79.4 kg). Ideal: Ideal body weight: 73 kg (160 lb 15 oz) Adjusted ideal body weight: 75.6 kg (166 lb 9 oz)  Assessment   Status Diagnosis  Controlled Controlled Controlled 1. Chronic pain syndrome   2. Failed back surgical  syndrome (x3)   3. 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 Chronic lower extremity pain (2ry area of Pain) (Bilateral) (L>R)   5. Pharmacologic therapy   6. Chronic use of opiate for therapeutic purpose   7. Encounter for medication management      Updated Problems: No problems updated.  Plan of Care  Problem-specific:  No problem-specific Assessment & Plan notes found for this encounter.  Mr. PHYLLIP CLAW has a current medication list which includes the following long-term medication(s): tizanidine, meloxicam, [START ON 08/19/2021] oxycodone hcl, [START ON  09/18/2021] oxycodone hcl, and [START ON 10/18/2021] oxycodone hcl.  Pharmacotherapy (Medications Ordered): Meds ordered this encounter  Medications   Oxycodone HCl 10 MG TABS    Sig: Take 1 tablet (10 mg total) by mouth every 6 (six) hours as needed. Must last 30 days    Dispense:  120 tablet    Refill:  0    DO NOT: delete (not duplicate); no partial-fill (will deny script to complete), no refill request (F/U required). DISPENSE: 1 day early if closed on fill date. WARN: No CNS-depressants within 8 hrs of med.   Oxycodone HCl 10 MG TABS    Sig: Take 1 tablet (10 mg total) by mouth every 6 (six) hours as needed. Must last 30 days    Dispense:  120 tablet    Refill:  0    DO NOT: delete (not duplicate); no partial-fill (will deny script to complete), no refill request (F/U required). DISPENSE: 1 day early if closed on fill date. WARN: No CNS-depressants within 8 hrs of med.   Oxycodone HCl 10 MG TABS    Sig: Take 1 tablet (10 mg total) by mouth every 6 (six) hours as needed. Must last 30 days    Dispense:  120 tablet    Refill:  0    DO NOT: delete (not duplicate); no partial-fill (will deny script to complete), no refill request (F/U required). DISPENSE: 1 day early if closed on fill date. WARN: No CNS-depressants within 8 hrs of med.   Orders:  No orders of the defined types  were placed in this encounter.  Follow-up plan:   Return in about 3 months (around 11/17/2021) for Eval-day (M,W), (F2F), (MM).     Interventional Therapies  Risk   Complexity Considerations:   Estimated body mass index is 24.54 kg/m as calculated from the following:   Height as of this encounter: '5\' 10"'  (1.778 m).   Weight as of this encounter: 171 lb (77.6 kg). WNL   Planned   Pending:      Under consideration:   Diagnostic bilateral lumbar facet block #1  Possible bilateral lumbar facet RFA  Diagnostic bilateral sacroiliac joint block #1  Possible bilateral sacroiliac joint RFA  Possible spinal cord stimulator trial    Completed:   Therapeutic right L2-3 LESI x1 (08/29/2020)  Diagnostic left lumbar facet MBB x1 (03/21/2020)  Diagnostic left SI block x1 (03/21/2020)  Therapeutic/palliative right lateral epicondyle (elbow) injection x2 (01/21/2021)  Therapeutic/palliative left Racz procedure x6 (targeting the left S2, S3 area) (06/05/2021) (0/0/90/90)    Therapeutic   Palliative (PRN) options:   Palliative right lateral epicondyle (elbow) injection #2  Palliative left Racz procedure #7 (targeting the left S2, S3 area)     Recent Visits Date Type Provider Dept  06/24/21 Office Visit Milinda Pointer, MD [facility]  06/05/21 Procedure visit Milinda Pointer, MD [facility]  05/21/21 Office Visit Milinda Pointer, MD [facility]  Showing recent visits within past 90 days and meeting all other requirements Today's Visits Date Type Provider Dept  08/13/21 Office Visit Milinda Pointer, MD [facility]  Showing today's visits and meeting all other requirements Future Appointments No visits were found meeting these conditions. Showing future appointments within next 90 days and meeting all other requirements  I discussed the assessment and treatment plan with the patient. The patient was provided an opportunity to ask questions and  all were answered. The patient agreed with the plan and demonstrated an understanding of the  instructions.  Patient advised to call back or seek an in-person evaluation if the symptoms or condition worsens.  Duration of encounter: 30 minutes.  Note by: Gaspar Cola, MD Date: 08/13/2021; Time: 8:19 AM

## 2021-08-13 ENCOUNTER — Encounter: Payer: Self-pay | Admitting: Pain Medicine

## 2021-08-13 ENCOUNTER — Ambulatory Visit
Payer: BC Managed Care – PPO | Attending: Student in an Organized Health Care Education/Training Program | Admitting: Pain Medicine

## 2021-08-13 ENCOUNTER — Other Ambulatory Visit: Payer: Self-pay

## 2021-08-13 VITALS — BP 131/80 | HR 98 | Temp 97.2°F | Resp 16 | Ht 70.0 in | Wt 175.0 lb

## 2021-08-13 DIAGNOSIS — G8929 Other chronic pain: Secondary | ICD-10-CM | POA: Diagnosis present

## 2021-08-13 DIAGNOSIS — Z79891 Long term (current) use of opiate analgesic: Secondary | ICD-10-CM | POA: Insufficient documentation

## 2021-08-13 DIAGNOSIS — M79604 Pain in right leg: Secondary | ICD-10-CM | POA: Diagnosis not present

## 2021-08-13 DIAGNOSIS — Z79899 Other long term (current) drug therapy: Secondary | ICD-10-CM | POA: Diagnosis present

## 2021-08-13 DIAGNOSIS — M961 Postlaminectomy syndrome, not elsewhere classified: Secondary | ICD-10-CM | POA: Insufficient documentation

## 2021-08-13 DIAGNOSIS — M5442 Lumbago with sciatica, left side: Secondary | ICD-10-CM | POA: Insufficient documentation

## 2021-08-13 DIAGNOSIS — M79605 Pain in left leg: Secondary | ICD-10-CM | POA: Insufficient documentation

## 2021-08-13 DIAGNOSIS — M5441 Lumbago with sciatica, right side: Secondary | ICD-10-CM | POA: Insufficient documentation

## 2021-08-13 DIAGNOSIS — G894 Chronic pain syndrome: Secondary | ICD-10-CM | POA: Insufficient documentation

## 2021-08-13 MED ORDER — OXYCODONE HCL 10 MG PO TABS: 10.0000 mg | ORAL_TABLET | Freq: Four times a day (QID) | ORAL | 0 refills | Status: DC | PRN

## 2021-08-13 NOTE — Progress Notes (Signed)
Nursing Pain Medication Assessment:  Safety precautions to be maintained throughout the outpatient stay will include: orient to surroundings, keep bed in low position, maintain call bell within reach at all times, provide assistance with transfer out of bed and ambulation.  Medication Inspection Compliance: Pill count conducted under aseptic conditions, in front of the patient. Neither the pills nor the bottle was removed from the patient's sight at any time. Once count was completed pills were immediately returned to the patient in their original bottle.  Medication: Oxycodone IR Pill/Patch Count:  19 of 120 pills remain Pill/Patch Appearance: Markings consistent with prescribed medication Bottle Appearance: Standard pharmacy container. Clearly labeled. Filled Date: 01 / 14 / 2023 Last Medication intake:  Today

## 2021-08-14 ENCOUNTER — Encounter: Payer: BC Managed Care – PPO | Admitting: Student in an Organized Health Care Education/Training Program

## 2021-09-04 IMAGING — CR DG ELBOW COMPLETE 3+V*L*
1 series · 4 of 4 positions shown · non-contrast
Comparison: None.

CLINICAL DATA: Bilateral elbow pain.  No known injury.

EXAM:
LEFT ELBOW - COMPLETE 3+ VIEW

[Series 1: dg elbow complete left (3+view) · 0.14mm/px · 4 of 4 slices shown]
[im 1/4]
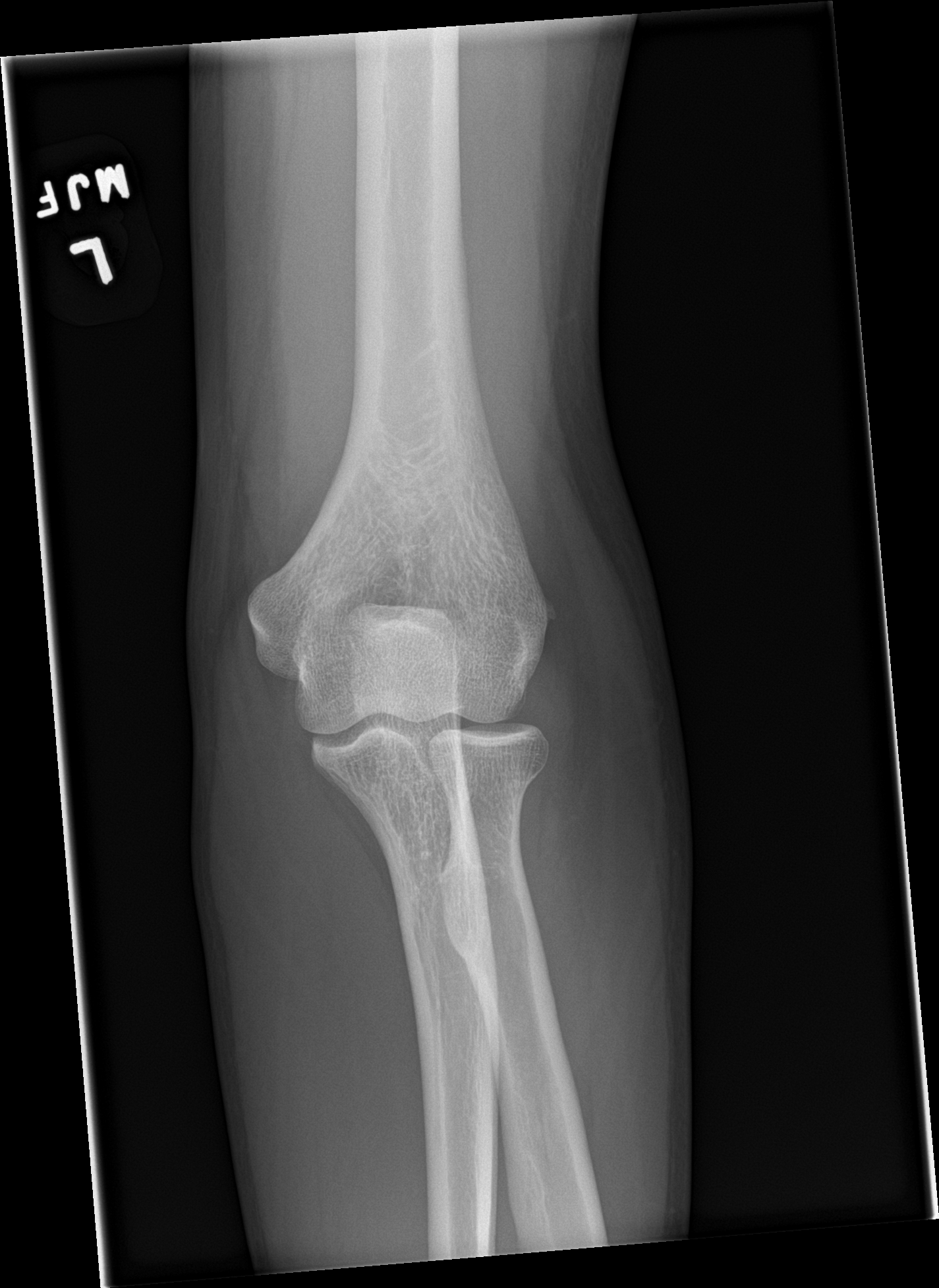
[im 2/4]
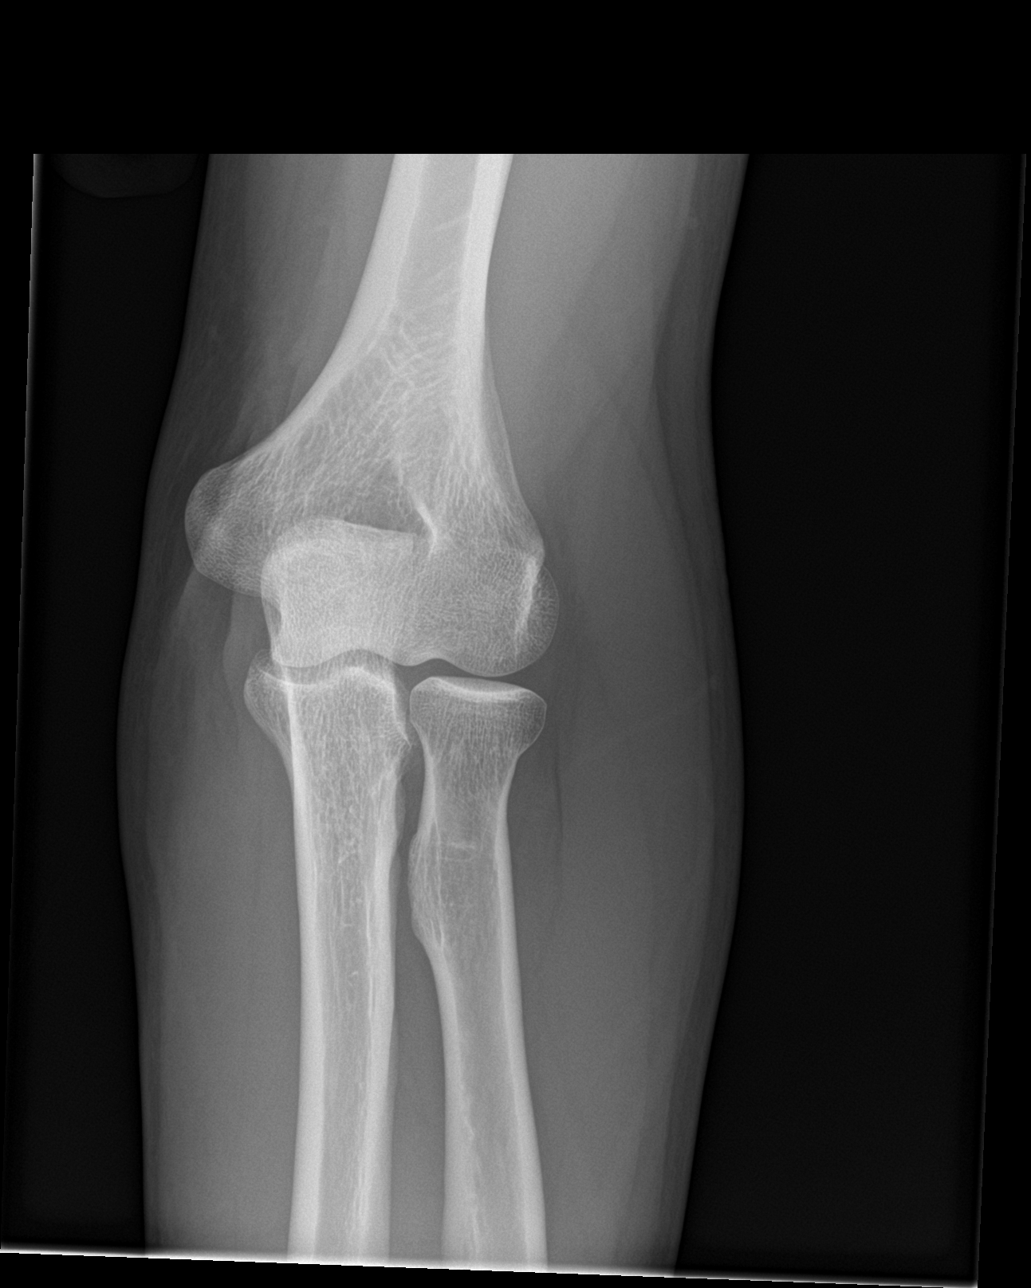
[im 3/4]
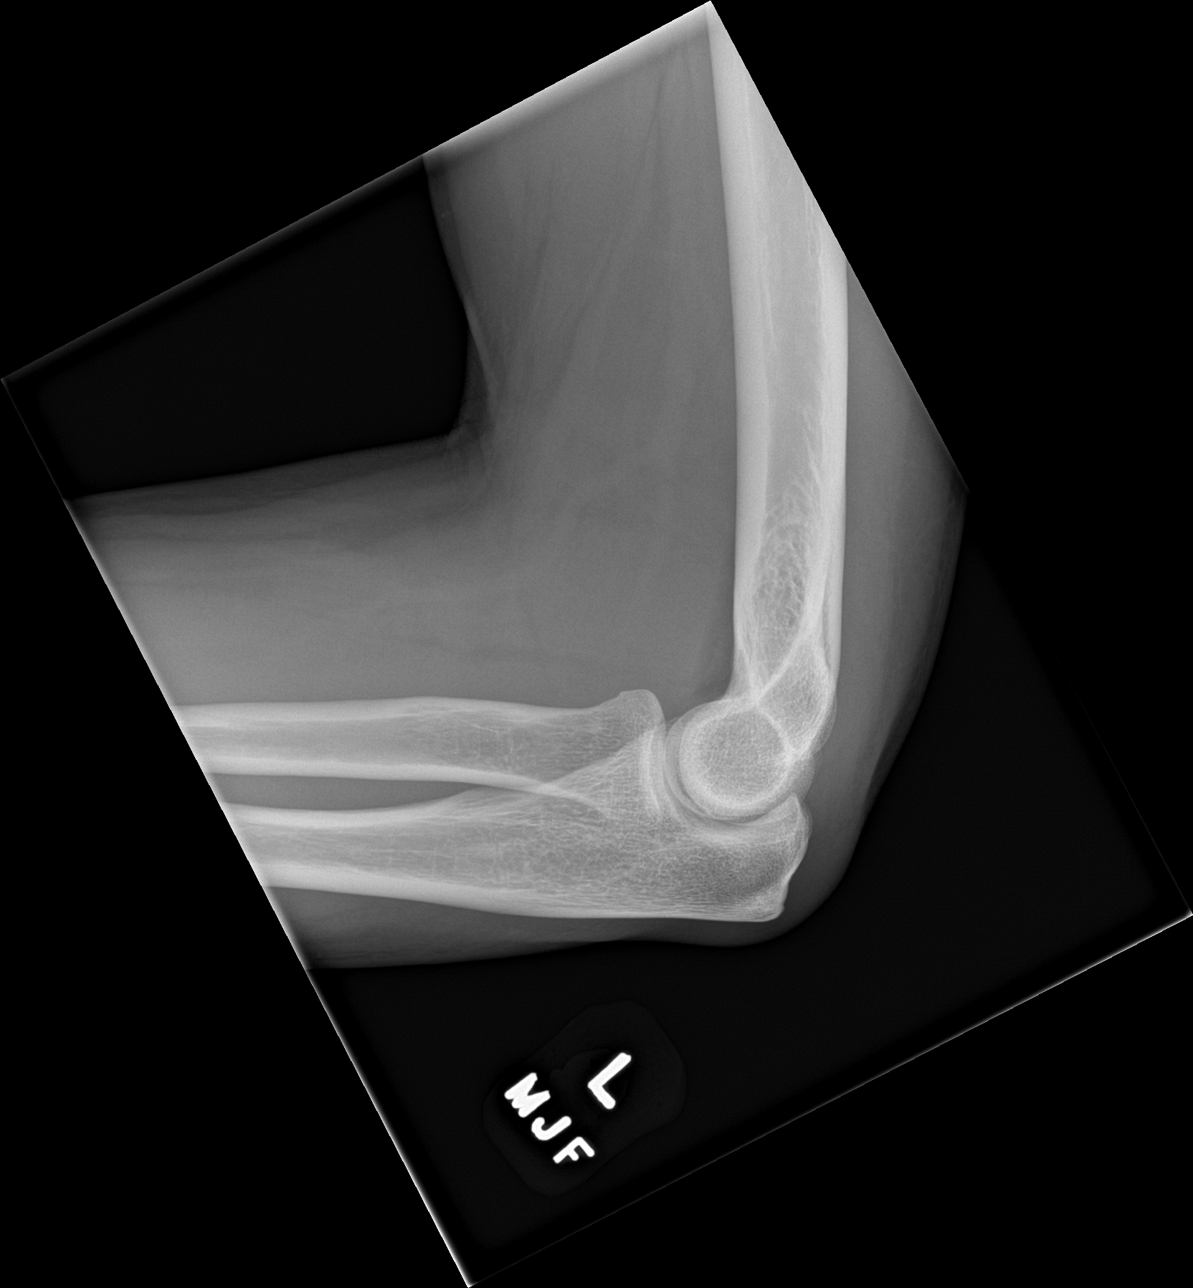
[im 4/4]
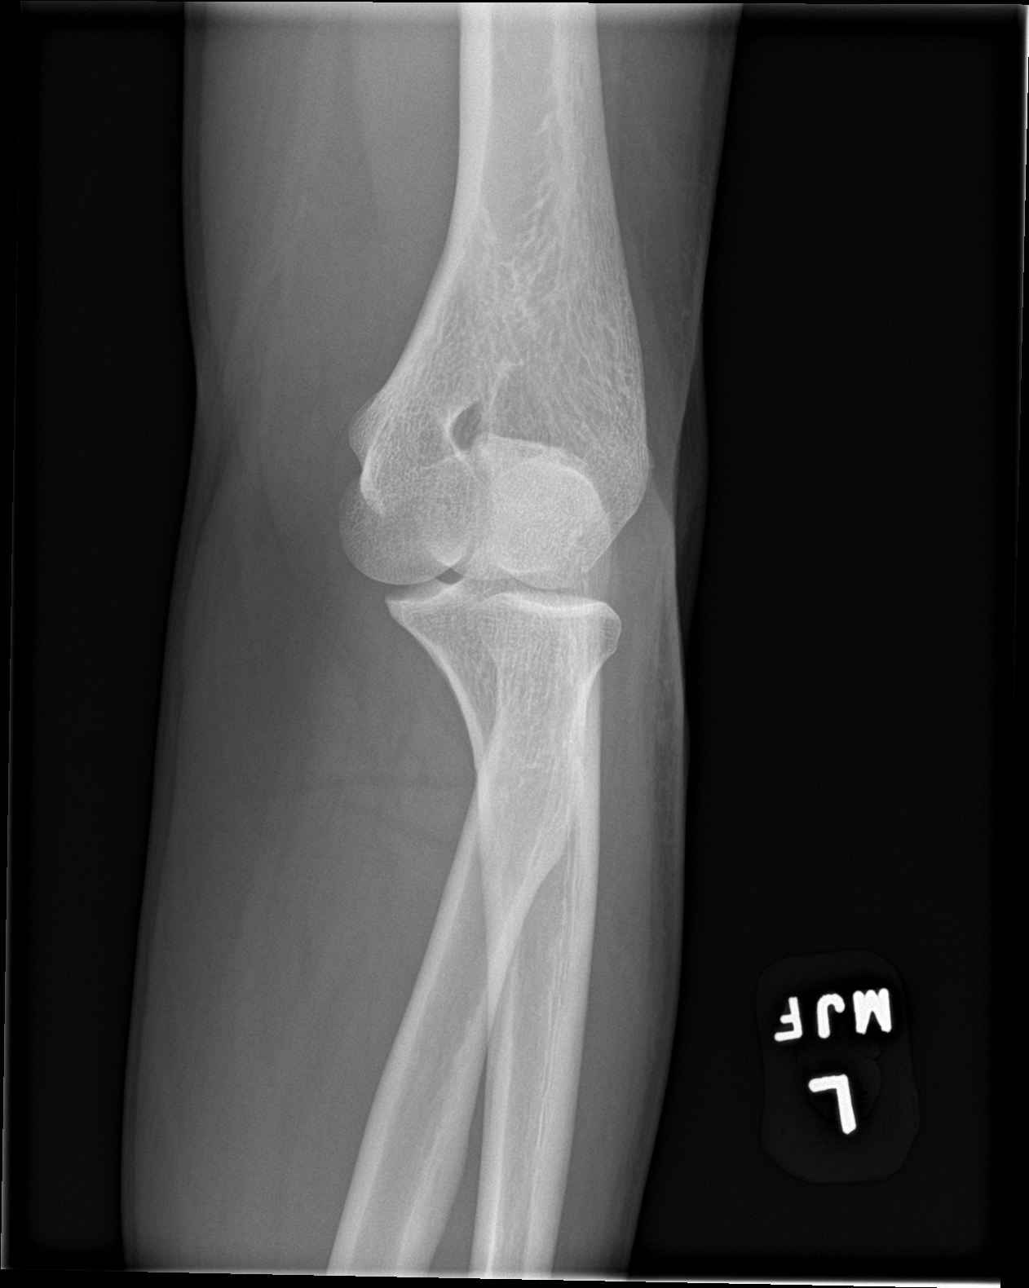

[4 of 4 positions shown; findings below may reference images not displayed]

FINDINGS: Normal joint spaces and alignment. There is no evidence of fracture,
dislocation, or joint effusion. There is no evidence of arthropathy
or other focal bone abnormality. Soft tissues are unremarkable.
IMPRESSION: Negative radiographs of the left elbow.

## 2021-09-11 ENCOUNTER — Telehealth: Payer: Self-pay | Admitting: Pain Medicine

## 2021-09-11 ENCOUNTER — Other Ambulatory Visit: Payer: Self-pay | Admitting: Pain Medicine

## 2021-09-11 DIAGNOSIS — Z79891 Long term (current) use of opiate analgesic: Secondary | ICD-10-CM

## 2021-09-11 DIAGNOSIS — G8929 Other chronic pain: Secondary | ICD-10-CM

## 2021-09-11 DIAGNOSIS — M961 Postlaminectomy syndrome, not elsewhere classified: Secondary | ICD-10-CM

## 2021-09-11 DIAGNOSIS — M79605 Pain in left leg: Secondary | ICD-10-CM

## 2021-09-11 DIAGNOSIS — M5442 Lumbago with sciatica, left side: Secondary | ICD-10-CM

## 2021-09-11 DIAGNOSIS — Z79899 Other long term (current) drug therapy: Secondary | ICD-10-CM

## 2021-09-11 DIAGNOSIS — G894 Chronic pain syndrome: Secondary | ICD-10-CM

## 2021-09-11 MED ORDER — OXYCODONE HCL 10 MG PO TABS: 10.0000 mg | ORAL_TABLET | Freq: Four times a day (QID) | ORAL | 0 refills | Status: DC | PRN

## 2021-09-11 NOTE — Telephone Encounter (Signed)
Called patient to let him know that I have sent his message to [facility],  ?

## 2021-09-11 NOTE — Telephone Encounter (Signed)
Bubble sent to [facility] ? ?

## 2021-09-11 NOTE — Progress Notes (Signed)
I will review her pharmacy closed.  Prescriptions had to be sent again to another pharmacy.

## 2021-09-15 NOTE — Telephone Encounter (Signed)
New Rx;s sent to [facility]. ?

## 2021-11-04 NOTE — Progress Notes (Signed)
PROVIDER NOTE: Information contained herein reflects review and annotations entered in association with encounter. Interpretation of such information and data should be left to medically-trained personnel. Information provided to patient can be located elsewhere in the medical record under "Patient Instructions". Document created using STT-dictation technology, any transcriptional errors that may result from process are unintentional.  ?  ?Patient: Derek [name]  Service Category: E/M  Provider: Oswaldo DoneFrancisco A Cade Olberding, MD  ?DOB: 10/06/1980  DOS: 11/05/2021  Specialty: Interventional Pain Management  ?MRN: [id number]  Setting: Ambulatory outpatient  PCP: Wilford CornerWhitaker, Jason Hestle, PA-C  ?Type: Established Patient    Referring Provider: Sherron Mondayejan-Sie, S Ahmed, MD  ?Location: Office  Delivery: Face-to-face    ? ?HPI  ?Derek [name], a 41 y.o. year old male, is here today because of his Chronic pain syndrome [G89.4]. Derek [name]'s primary complain today is Back Pain ?Last encounter: My last encounter with him was on 09/11/2021. ?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); Somatic dysfunction of sacroiliac joints (Bilateral); Chronic hip pain (Bilateral); Epidural fibrosis; Lumbar postlaminectomy syndrome; DDD (degenerative disc disease), lumbosacral; Lumbar facet syndrome (Bilateral) (L>R); Other specified dorsopathies, sacral and sacrococcygeal region; Spondylosis without myelopathy or radiculopathy, lumbosacral region; Neurogenic pain; Osteoarthritic spondylosis of lumbar spine; Tendinitis of elbows (Bilateral); Lateral epicondylitis of  elbows (Bilateral); Chronic elbow pain (Right); Chronic low back pain (Left) w/o sciatica; Numbness of anterior thigh (Right); Burning pain in thigh (Right); Chronic low back pain (Midline) w/o sciatica; Acute exacerbation of chronic low back pain; Chronic elbow pain (Bilateral); Radial collateral ligament sprain of elbow, unspecified laterality, sequela (Bilateral); Enthesopathy of elbow region (Bilateral); Sprain of lateral collateral ligament of elbow, sequela (Left); Sprain of lateral collateral ligament of elbow, sequela (Right); Radicular pain of lumbosacral region; and Lumbosacral radiculopathy on their pertinent problem list. ?Pain Assessment: Severity of Chronic pain is reported as a 7 /10. Location: Back Lower, Right/hips/buttocks bilater down back of leg to above knees. Onset: More than a month ago. Quality: Aching, Constant, Shooting, Discomfort, Crushing, Pressure. Timing: Constant. Modifying factor(s): medication, procedures. streching. ?Vitals:  height is 5\' 10"  (1.778 m) and weight is 175 lb (79.4 kg). His temperature is 98.1 ?F (36.7 ?C). His blood pressure is 152/94 (abnormal) and his pulse is 93. His respiration is 16 and oxygen saturation is 99%.  ? ?Reason for encounter: medication management.   The patient indicates doing well with the current medication regimen. No adverse reactions or side effects reported to the medications.  ? ?Pharmacy closures and lack of proper medication stock continue to be a significant documentation and prescription nightmare.  Patient last seen on 08/13/2021, but another set of prescriptions had to be sent to a different pharmacy on 09/11/2021 when his pharmacy of record closed without warning. ? ?The patient indicates increase in his low back and bilateral hip pain.  He refers that it is the same pain as always.  His last Racz procedure was on 06/05/2021.  He would like to have the Racz procedure repeated.  We will schedule it as soon as possible. ? ?RTCB: 02/15/2022  (prescriptions printed today secondary to inability to send prescriptions electronically) ?Nonopioids transferred 05/02/2020: Zanaflex, Flexeril, and Mobic ? ?Pharmacotherapy Assessment  ?Analgesic: Oxycodone  IR 10 mg, 1 tab PO q 6 hrs (40 mg/day of oxycodone) ?MME/day: 60 mg/day.  ? ?Monitoring: ?[place] PMP: PDMP reviewed during this encounter.       ?Pharmacotherapy: No side-effects or adverse reactions reported. ?Compliance: No problems identified. ?Effectiveness: Clinically acceptable. ? ?Newman Pies, RN  11/05/2021  8:20 AM  Sign when Signing Visit ?Nursing Pain Medication Assessment:  ?Safety precautions to be maintained throughout the outpatient stay will include: orient to surroundings, keep bed in low position, maintain call bell within reach at all times, provide assistance with transfer out of bed and ambulation.  ?Medication Inspection Compliance: Pill count conducted under aseptic conditions, in front of the patient. Neither the pills nor the bottle was removed from the patient's sight at any time. Once count was completed pills were immediately returned to the patient in their original bottle. ? ?Medication: See above ?Pill/Patch Count:  40 of 120 pills remain ?Pill/Patch Appearance: Markings consistent with prescribed medication ?Bottle Appearance: Standard pharmacy container. Clearly labeled. ?Filled Date: 4 / 42 / 2023 ?Last Medication intake:  Today ?   UDS:  ?Summary  ?Date Value Ref Range Status  ?02/19/2021 Note  Final  ?  Comment:  ?  ==================================================================== ?[employer] Select 13 (MW) ?==================================================================== ?Test                             Result       Flag       Units ? ?Drug Present and Declared for Prescription Verification ?  Oxycodone                      729          EXPECTED   ng/mg creat ?  Oxymorphone                    3202         EXPECTED   ng/mg creat ?  Noroxycodone                   1253          EXPECTED   ng/mg creat ?  Noroxymorphone                 627          EXPECTED   ng/mg creat ?   Sources of oxycodone are scheduled prescription medications. ?   Oxymorphone, noroxycodone, and noroxymorphone are expected ?   metabolites of oxycodone. Oxymorphone is also available as a ?   scheduled prescription medication. ? ?==================================================================== ?Test                      Result    Flag   Units      Ref Range ?  Creatinine              45               mg/dL      >=82 ?==================================================================== ?Declared Medications: ? The flagging and interpretation on this report are based on the ? following declared medications.  Unexpected results may arise from ? inaccuracies in the declared medications. ? ? **Note: The testing scope of this panel includes these medications: ? ? Oxycodone ? ? **Note: The testing scope of this panel does not include the ? following reported medications: ? ? Calcium ? Cyclobenzaprine (Flexeril) ? Meloxicam (Mobic) ? Tizanidine (  Zanaflex) ? Topical Diclofenac ? Vitamin D ?==================================================================== ?For clinical consultation, please call [phone]. ?==================================================================== ?  ?  ? ?ROS  ?Constitutional: Denies any fever or chills ?Gastrointestinal: No reported hemesis, hematochezia, vomiting, or acute GI distress ?Musculoskeletal: Denies any acute onset joint swelling, redness, loss of ROM, or weakness ?Neurological: No reported episodes of acute onset apraxia, aphasia, dysarthria, agnosia, amnesia, paralysis, loss of coordination, or loss of consciousness ? ?Medication Review  ?Calcium Carb-Cholecalciferol, Diclofenac Sodium, Oxycodone HCl, meloxicam, and tiZANidine ? ?History Review  ?Allergy: Derek [name] is allergic to amoxicillin and vicodin [hydrocodone-acetaminophen]. ?Drug: Derek [name]  reports no history of  drug use. ?Alcohol:  reports current alcohol use. ?Tobacco:  reports that he has been smoking cigarettes. He has a 16.00 pack-year smoking history. He has never used smokeless tobacco. ?Social: Mr.

## 2021-11-05 ENCOUNTER — Ambulatory Visit: Payer: BC Managed Care – PPO | Attending: Pain Medicine | Admitting: Pain Medicine

## 2021-11-05 ENCOUNTER — Encounter: Payer: Self-pay | Admitting: Pain Medicine

## 2021-11-05 VITALS — BP 152/94 | HR 93 | Temp 98.1°F | Resp 16 | Ht 70.0 in | Wt 175.0 lb

## 2021-11-05 DIAGNOSIS — M961 Postlaminectomy syndrome, not elsewhere classified: Secondary | ICD-10-CM | POA: Insufficient documentation

## 2021-11-05 DIAGNOSIS — M79605 Pain in left leg: Secondary | ICD-10-CM | POA: Diagnosis present

## 2021-11-05 DIAGNOSIS — G8929 Other chronic pain: Secondary | ICD-10-CM | POA: Insufficient documentation

## 2021-11-05 DIAGNOSIS — Z79899 Other long term (current) drug therapy: Secondary | ICD-10-CM | POA: Insufficient documentation

## 2021-11-05 DIAGNOSIS — M5441 Lumbago with sciatica, right side: Secondary | ICD-10-CM | POA: Diagnosis present

## 2021-11-05 DIAGNOSIS — M5442 Lumbago with sciatica, left side: Secondary | ICD-10-CM | POA: Diagnosis not present

## 2021-11-05 DIAGNOSIS — M79604 Pain in right leg: Secondary | ICD-10-CM | POA: Insufficient documentation

## 2021-11-05 DIAGNOSIS — G894 Chronic pain syndrome: Secondary | ICD-10-CM | POA: Diagnosis not present

## 2021-11-05 DIAGNOSIS — Z79891 Long term (current) use of opiate analgesic: Secondary | ICD-10-CM | POA: Insufficient documentation

## 2021-11-05 MED ORDER — OXYCODONE HCL 10 MG PO TABS: 10.0000 mg | ORAL_TABLET | Freq: Four times a day (QID) | ORAL | 0 refills | Status: DC | PRN

## 2021-11-05 NOTE — Patient Instructions (Signed)
______________________________________________________________________ ? ?Preparing for Procedure with Sedation ? ?NOTICE: Due to recent regulatory changes, starting on February 03, 2021, procedures requiring intravenous (IV) sedation will no longer be performed at the [facility].  These types of procedures are required to be performed at [facility] ambulatory surgery facility.  We are very sorry for the inconvenience. ? ?Procedure appointments are limited to planned procedures: ?No Prescription Refills. ?No disability issues will be discussed. ?No medication changes will be discussed. ? ?Instructions: ?Oral Intake: Do not eat or drink anything for at least 8 hours prior to your procedure. (Exception: Blood Pressure Medication. See below.) ?Transportation: A driver is required. You may not drive yourself after the procedure. ?Blood Pressure Medicine: Do not forget to take your blood pressure medicine with a sip of water the morning of the procedure. If your Diastolic (lower reading) is above 100 mmHg, elective cases will be cancelled/rescheduled. ?Blood thinners: These will need to be stopped for procedures. Notify our staff if you are taking any blood thinners. Depending on which one you take, there will be specific instructions on how and when to stop it. ?Diabetics on insulin: Notify the staff so that you can be scheduled 1st case in the morning. If your diabetes requires high dose insulin, take only ? of your normal insulin dose the morning of the procedure and notify the staff that you have done so. ?Preventing infections: Shower with an antibacterial soap the morning of your procedure. ?Build-up your immune system: Take 1000 mg of Vitamin C with every meal (3 times a day) the day prior to your procedure. ?Antibiotics: Inform the staff if you have a condition or reason that requires you to take antibiotics before dental procedures. ?Pregnancy: If you are pregnant, call and cancel the procedure. ?Sickness: If  you have a cold, fever, or any active infections, call and cancel the procedure. ?Arrival: You must be in the facility at least 30 minutes prior to your scheduled procedure. ?Children: Do not bring children with you. ?Dress appropriately: There is always the possibility that your clothing may get soiled. ?Valuables: Do not bring any jewelry or valuables. ? ?Reasons to call and reschedule or cancel your procedure: (Following these recommendations will minimize the risk of a serious complication.) ?Surgeries: Avoid having procedures within 2 weeks of any surgery. (Avoid for 2 weeks before or after any surgery). ?Flu Shots: Avoid having procedures within 2 weeks of a flu shots. (Avoid for 2 weeks before or after immunizations). ?Barium: Avoid having a procedure within 7-10 days after having had a radiological study involving the use of radiological contrast. (Myelograms, Barium swallow or enema study). ?Heart attacks: Avoid any elective procedures or surgeries for the initial 6 months after a "Myocardial Infarction" (Heart Attack). ?Blood thinners: It is imperative that you stop these medications before procedures. Let us know if you if you take any blood thinner.  ?Infection: Avoid procedures during or within two weeks of an infection (including chest colds or gastrointestinal problems). Symptoms associated with infections include: Localized redness, fever, chills, night sweats or profuse sweating, burning sensation when voiding, cough, congestion, stuffiness, runny nose, sore throat, diarrhea, nausea, vomiting, cold or Flu symptoms, recent or current infections. It is specially important if the infection is over the area that we intend to treat. ?Heart and lung problems: Symptoms that may suggest an active cardiopulmonary problem include: cough, chest pain, breathing difficulties or shortness of breath, dizziness, ankle swelling, uncontrolled high or unusually low blood pressure, and/or palpitations. If you are    experiencing any of these symptoms, cancel your procedure and contact your primary care physician for an evaluation. ? ?Remember:  ?Regular Business hours are:  ?Monday to Thursday 8:00 AM to 4:00 PM ? ?Provider's Schedule: ?Gurleen Larrivee, MD:  ?Procedure days: Tuesday and Thursday 7:30 AM to 4:00 PM ? ?Bilal Lateef, MD:  ?Procedure days: Monday and Wednesday 7:30 AM to 4:00 PM ?______________________________________________________________________ ? ____________________________________________________________________________________________ ? ?General Risks and Possible Complications ? ?Patient Responsibilities: It is important that you read this as it is part of your informed consent. It is our duty to inform you of the risks and possible complications associated with treatments offered to you. It is your responsibility as a patient to read this and to ask questions about anything that is not clear or that you believe was not covered in this document. ? ?Patient?s Rights: You have the right to refuse treatment. You also have the right to change your mind, even after initially having agreed to have the treatment done. However, under this last option, if you wait until the last second to change your mind, you may be charged for the materials used up to that point. ? ?Introduction: Medicine is not an exact science. Everything in Medicine, including the lack of treatment(s), carries the potential for danger, harm, or loss (which is by definition: Risk). In Medicine, a complication is a secondary problem, condition, or disease that can aggravate an already existing one. All treatments carry the risk of possible complications. The fact that a side effects or complications occurs, does not imply that the treatment was conducted incorrectly. It must be clearly understood that these can happen even when everything is done following the highest safety standards. ? ?No treatment: You can choose not to proceed with the  proposed treatment alternative. The ?PRO(s)? would include: avoiding the risk of complications associated with the therapy. The ?CON(s)? would include: not getting any of the treatment benefits. These benefits fall under one of three categories: diagnostic; therapeutic; and/or palliative. Diagnostic benefits include: getting information which can ultimately lead to improvement of the disease or symptom(s). Therapeutic benefits are those associated with the successful treatment of the disease. Finally, palliative benefits are those related to the decrease of the primary symptoms, without necessarily curing the condition (example: decreasing the pain from a flare-up of a chronic condition, such as incurable terminal cancer). ? ?General Risks and Complications: These are associated to most interventional treatments. They can occur alone, or in combination. They fall under one of the following six (6) categories: no benefit or worsening of symptoms; bleeding; infection; nerve damage; allergic reactions; and/or death. ?No benefits or worsening of symptoms: In Medicine there are no guarantees, only probabilities. No healthcare provider can ever guarantee that a medical treatment will work, they can only state the probability that it may. Furthermore, there is always the possibility that the condition may worsen, either directly, or indirectly, as a consequence of the treatment. ?Bleeding: This is more common if the patient is taking a blood thinner, either prescription or over the counter (example: Goody Powders, Fish oil, Aspirin, Garlic, etc.), or if suffering a condition associated with impaired coagulation (example: Hemophilia, cirrhosis of the liver, low platelet counts, etc.). However, even if you do not have one on these, it can still happen. If you have any of these conditions, or take one of these drugs, make sure to notify your treating physician. ?Infection: This is more common in patients with a compromised  immune system, either due to disease (example:   diabetes, cancer, human immunodeficiency virus [HIV], etc.), or due to medications or treatments (example: therapies used to treat cancer and rheumatological dise

## 2021-11-05 NOTE — Progress Notes (Signed)
Nursing Pain Medication Assessment:  ?Safety precautions to be maintained throughout the outpatient stay will include: orient to surroundings, keep bed in low position, maintain call bell within reach at all times, provide assistance with transfer out of bed and ambulation.  ?Medication Inspection Compliance: Pill count conducted under aseptic conditions, in front of the patient. Neither the pills nor the bottle was removed from the patient's sight at any time. Once count was completed pills were immediately returned to the patient in their original bottle. ? ?Medication: See above ?Pill/Patch Count:  40 of 120 pills remain ?Pill/Patch Appearance: Markings consistent with prescribed medication ?Bottle Appearance: Standard pharmacy container. Clearly labeled. ?Filled Date: 4 / 64 / 2023 ?Last Medication intake:  Today ?

## 2021-12-17 ENCOUNTER — Telehealth: Payer: Self-pay

## 2021-12-17 ENCOUNTER — Telehealth: Payer: Self-pay | Admitting: Pain Medicine

## 2021-12-17 ENCOUNTER — Other Ambulatory Visit: Payer: Self-pay | Admitting: *Deleted

## 2021-12-17 DIAGNOSIS — Z79891 Long term (current) use of opiate analgesic: Secondary | ICD-10-CM

## 2021-12-17 DIAGNOSIS — G894 Chronic pain syndrome: Secondary | ICD-10-CM

## 2021-12-17 DIAGNOSIS — G8929 Other chronic pain: Secondary | ICD-10-CM

## 2021-12-17 DIAGNOSIS — Z79899 Other long term (current) drug therapy: Secondary | ICD-10-CM

## 2021-12-17 DIAGNOSIS — M961 Postlaminectomy syndrome, not elsewhere classified: Secondary | ICD-10-CM

## 2021-12-17 MED ORDER — OXYCODONE HCL 10 MG PO TABS: 10.0000 mg | ORAL_TABLET | Freq: Four times a day (QID) | ORAL | 0 refills | Status: DC | PRN

## 2021-12-17 NOTE — Telephone Encounter (Signed)
Patient wants to get meds send to [facility] on [address] Due to meds on back order at current pharmacy. Please give patient a call. Thanks

## 2021-12-17 NOTE — Telephone Encounter (Signed)
Rx request sent to Dr. Naveira. 

## 2021-12-17 NOTE — Telephone Encounter (Signed)
Can you send his scripts at [facility] [address]. They have it for the moment. Please call him and let him know if this is done.

## 2021-12-18 ENCOUNTER — Ambulatory Visit
Admission: RE | Admit: 2021-12-18 | Discharge: 2021-12-18 | Disposition: A | Discharge status: Home / Self Care | Payer: BC Managed Care – PPO | Source: Ambulatory Visit | Attending: Pain Medicine | Admitting: Pain Medicine

## 2021-12-18 ENCOUNTER — Encounter: Payer: Self-pay | Admitting: Pain Medicine

## 2021-12-18 ENCOUNTER — Ambulatory Visit: Payer: BC Managed Care – PPO | Attending: Pain Medicine | Admitting: Pain Medicine

## 2021-12-18 VITALS — BP 112/83 | HR 102 | Temp 97.4°F | Resp 17 | Ht 70.0 in | Wt 176.0 lb

## 2021-12-18 DIAGNOSIS — G8929 Other chronic pain: Secondary | ICD-10-CM | POA: Diagnosis present

## 2021-12-18 DIAGNOSIS — M5417 Radiculopathy, lumbosacral region: Secondary | ICD-10-CM | POA: Insufficient documentation

## 2021-12-18 DIAGNOSIS — M79604 Pain in right leg: Secondary | ICD-10-CM | POA: Insufficient documentation

## 2021-12-18 DIAGNOSIS — M5441 Lumbago with sciatica, right side: Secondary | ICD-10-CM | POA: Diagnosis present

## 2021-12-18 DIAGNOSIS — M5442 Lumbago with sciatica, left side: Secondary | ICD-10-CM | POA: Insufficient documentation

## 2021-12-18 DIAGNOSIS — M961 Postlaminectomy syndrome, not elsewhere classified: Secondary | ICD-10-CM | POA: Insufficient documentation

## 2021-12-18 DIAGNOSIS — G96198 Other disorders of meninges, not elsewhere classified: Secondary | ICD-10-CM | POA: Diagnosis present

## 2021-12-18 DIAGNOSIS — M960 Pseudarthrosis after fusion or arthrodesis: Secondary | ICD-10-CM | POA: Diagnosis present

## 2021-12-18 DIAGNOSIS — M5137 Other intervertebral disc degeneration, lumbosacral region: Secondary | ICD-10-CM | POA: Insufficient documentation

## 2021-12-18 DIAGNOSIS — M79605 Pain in left leg: Secondary | ICD-10-CM | POA: Diagnosis present

## 2021-12-18 MED ORDER — MIDAZOLAM HCL 5 MG/5ML IJ SOLN
INTRAMUSCULAR | Status: AC
  Filled 2021-12-18: qty 5

## 2021-12-18 MED ORDER — LACTATED RINGERS IV SOLN: Freq: Once | INTRAVENOUS | Status: AC

## 2021-12-18 MED ORDER — IOHEXOL 180 MG/ML  SOLN
INTRAMUSCULAR | Status: AC
  Filled 2021-12-18: qty 10

## 2021-12-18 MED ORDER — SODIUM CHLORIDE (PF) 0.9 % IJ SOLN
INTRAMUSCULAR | Status: AC
  Filled 2021-12-18: qty 20

## 2021-12-18 MED ORDER — LIDOCAINE HCL 2 % IJ SOLN
20.0000 mL | Freq: Once | INTRAMUSCULAR | Status: AC
  Administered 2021-12-18: 400 mg
  Filled 2021-12-18: qty 20

## 2021-12-18 MED ORDER — LIDOCAINE-EPINEPHRINE (PF) 2 %-1:200000 IJ SOLN
10.0000 mL | Freq: Once | INTRAMUSCULAR | Status: AC
  Administered 2021-12-18: 10 mL
  Filled 2021-12-18: qty 20

## 2021-12-18 MED ORDER — TRIAMCINOLONE ACETONIDE 40 MG/ML IJ SUSP
INTRAMUSCULAR | Status: AC
  Filled 2021-12-18: qty 1

## 2021-12-18 MED ORDER — MIDAZOLAM HCL 5 MG/5ML IJ SOLN
0.5000 mg | Freq: Once | INTRAMUSCULAR | Status: AC
  Administered 2021-12-18: 2 mg via INTRAVENOUS

## 2021-12-18 MED ORDER — ROPIVACAINE HCL 2 MG/ML IJ SOLN
4.0000 mL | Freq: Once | INTRAMUSCULAR | Status: AC
  Administered 2021-12-18: 2 mL via EPIDURAL

## 2021-12-18 MED ORDER — TRIAMCINOLONE ACETONIDE 40 MG/ML IJ SUSP
40.0000 mg | Freq: Once | INTRAMUSCULAR | Status: AC
  Administered 2021-12-18: 40 mg

## 2021-12-18 MED ORDER — FENTANYL CITRATE (PF) 100 MCG/2ML IJ SOLN
INTRAMUSCULAR | Status: AC
  Filled 2021-12-18: qty 2

## 2021-12-18 MED ORDER — FENTANYL CITRATE (PF) 100 MCG/2ML IJ SOLN
25.0000 ug | INTRAMUSCULAR | Status: DC | PRN
  Administered 2021-12-18: 100 ug via INTRAVENOUS

## 2021-12-18 MED ORDER — STERILE WATER FOR INJECTION IJ SOLN
INTRAMUSCULAR | Status: AC
  Filled 2021-12-18: qty 10

## 2021-12-18 MED ORDER — IOHEXOL 180 MG/ML  SOLN
10.0000 mL | Freq: Once | INTRAMUSCULAR | Status: AC
  Administered 2021-12-18: 10 mL via EPIDURAL

## 2021-12-18 MED ORDER — ROPIVACAINE HCL 2 MG/ML IJ SOLN
INTRAMUSCULAR | Status: AC
  Filled 2021-12-18: qty 20

## 2021-12-18 MED ORDER — STERILE WATER FOR INJECTION IJ SOLN
10.0000 mL | Freq: Once | INTRAVENOUS | Status: AC
  Administered 2021-12-18: 10 mL via EPIDURAL
  Filled 2021-12-18: qty 4.27

## 2021-12-18 MED ORDER — CEFAZOLIN SODIUM-DEXTROSE 1-4 GM/50ML-% IV SOLN
1.0000 g | INTRAVENOUS | Status: AC
  Administered 2021-12-18: 1 g via INTRAVENOUS

## 2021-12-18 MED ORDER — LIDOCAINE HCL 2 % IJ SOLN
INTRAMUSCULAR | Status: AC
  Filled 2021-12-18: qty 20

## 2021-12-18 MED ORDER — CEFAZOLIN SODIUM 1 G IJ SOLR
INTRAMUSCULAR | Status: AC
  Filled 2021-12-18: qty 10

## 2021-12-18 MED ORDER — SODIUM CHLORIDE 0.9% FLUSH
4.0000 mL | Freq: Once | INTRAVENOUS | Status: AC
  Administered 2021-12-18: 2 mL

## 2021-12-18 MED ORDER — HYALURONIDASE HUMAN 150 UNIT/ML IJ SOLN
1500.0000 [IU] | Freq: Once | INTRAMUSCULAR | Status: AC
  Administered 2021-12-18: 1500 [IU] via INTRADERMAL
  Filled 2021-12-18 (×2): qty 10

## 2021-12-18 NOTE — Progress Notes (Signed)
PROVIDER NOTE: Interpretation of information contained herein should be left to medically-trained personnel. Specific patient instructions are provided elsewhere under "Patient Instructions" section of medical record. This document was created in part using STT-dictation technology, any transcriptional errors that may result from this process are unintentional.  Patient: Derek [name] Type: Established DOB: 01-25-1981 MRN: [id number] PCP: Donnamarie Rossetti, PA-C  Service: Procedure DOS: 12/18/2021 Setting: Ambulatory Location: Ambulatory outpatient facility Delivery: Face-to-face Provider: Gaspar Cola, MD Specialty: Interventional Pain Management Specialty designation: 09 Location: Outpatient facility Ref. Prov.: Grayland Ormond Hestle,*    Primary Reason for Visit: Interventional Pain Management Treatment. CC: Back Pain (lower)    Procedure:          Anesthesia, Analgesia, Anxiolysis:  Type: Therapeutic Percutaneous Epidural Neuroplasty and Lysis of Adhesions (RACZ Procedure)   #7   Region: Caudal Level: Sacrococcygeal   Laterality: Midline aiming at the left  Anesthesia: Local (1-2% Lidocaine)  Anxiolysis: IV  Sedation: Moderate  Guidance: Fluoroscopy           Position: Prone   1. Epidural fibrosis   2. Radicular pain of lumbosacral region   3. Failed back surgical syndrome (x3)   4. L5-S1 pseudoarthrosis   5. DDD (degenerative disc disease), lumbosacral   6. Chronic low back pain (1ry area of Pain) (Bilateral) (L>R) w/ sciatica (Bilateral)   7. Chronic lower extremity pain (2ry area of Pain) (Bilateral) (L>R)   8. Lumbar postlaminectomy syndrome    NAS-11 Pain score:   Pre-procedure: 3 /10   Post-procedure: 0-No pain/10     Pre-op H&P Assessment:  Derek [name] is a 41 y.o. (year old), male patient, seen today for interventional treatment. He  has a past surgical history that includes Back surgery; HAND REIMPLANTED; and Lumbar fusion (11/14). Derek [name] has a  current medication list which includes the following prescription(s): calcium carb-cholecalciferol, cyclobenzaprine, diclofenac sodium, esomeprazole, meloxicam, oxycodone hcl, [START ON 01/16/2022] oxycodone hcl, oxycodone hcl, and tizanidine, and the following Facility-Administered Medications: fentanyl. His primarily concern today is the Back Pain (lower)  Initial Vital Signs:  Pulse/HCG Rate: (!) 102ECG Heart Rate: 73 (NSR) Temp: (!) 97.4 F (36.3 C) Resp: 16 BP: 128/78 SpO2: 96 %  BMI: Estimated body mass index is 25.25 kg/m as calculated from the following:   Height as of this encounter: _0  (1.778 m).   Weight as of this encounter: 176 lb (79.8 kg).  Risk Assessment: Allergies: Reviewed. He is allergic to amoxicillin and vicodin [hydrocodone-acetaminophen].  Allergy Precautions: None required Coagulopathies: Reviewed. None identified.  Blood-thinner therapy: None at this time Active Infection(s): Reviewed. None identified. Derek [name] is afebrile  Site Confirmation: Derek [name] was asked to confirm the procedure and laterality before marking the site Procedure checklist: Completed Consent: Before the procedure and under the influence of no sedative(s), amnesic(s), or anxiolytics, the patient was informed of the treatment options, risks and possible complications. To fulfill our ethical and legal obligations, as recommended by the American Medical Association's Code of Ethics, I have informed the patient of my clinical impression; the nature and purpose of the treatment or procedure; the risks, benefits, and possible complications of the intervention; the alternatives, including doing nothing; the risk(s) and benefit(s) of the alternative treatment(s) or procedure(s); and the risk(s) and benefit(s) of doing nothing. The patient was provided information about the general risks and possible complications associated with the procedure. These may include, but are not limited to: failure to  achieve desired goals, infection, bleeding, organ or nerve  damage, allergic reactions, paralysis, and death. In addition, the patient was informed of those risks and complications associated to Spine-related procedures, such as failure to decrease pain; infection (i.e.: Meningitis, epidural or intraspinal abscess); bleeding (i.e.: epidural hematoma, subarachnoid hemorrhage, or any other type of intraspinal or peri-dural bleeding); organ or nerve damage (i.e.: Any type of peripheral nerve, nerve root, or spinal cord injury) with subsequent damage to sensory, motor, and/or autonomic systems, resulting in permanent pain, numbness, and/or weakness of one or several areas of the body; allergic reactions; (i.e.: anaphylactic reaction); and/or death. Furthermore, the patient was informed of those risks and complications associated with the medications. These include, but are not limited to: allergic reactions (i.e.: anaphylactic or anaphylactoid reaction(s)); adrenal axis suppression; blood sugar elevation that in diabetics may result in ketoacidosis or comma; water retention that in patients with history of congestive heart failure may result in shortness of breath, pulmonary edema, and decompensation with resultant heart failure; weight gain; swelling or edema; medication-induced neural toxicity; particulate matter embolism and blood vessel occlusion with resultant organ, and/or nervous system infarction; and/or aseptic necrosis of one or more joints. Finally, the patient was informed that Medicine is not an exact science; therefore, there is also the possibility of unforeseen or unpredictable risks and/or possible complications that may result in a catastrophic outcome. The patient indicated having understood very clearly. We have given the patient no guarantees and we have made no promises. Enough time was given to the patient to ask questions, all of which were answered to the patient's satisfaction. Derek [name] has  indicated that he wanted to continue with the procedure. Attestation: I, the ordering provider, attest that I have discussed with the patient the benefits, risks, side-effects, alternatives, likelihood of achieving goals, and potential problems during recovery for the procedure that I have provided informed consent. Date  Time:   Pre-Procedure Preparation:  Monitoring: As per clinic protocol. Respiration, ETCO2, SpO2, BP, heart rate and rhythm monitor placed and checked for adequate function Safety Precautions: Patient was assessed for positional comfort and pressure points before starting the procedure. Time-out: I initiated and conducted the "Time-out" before starting the procedure, as per protocol. The patient was asked to participate by confirming the accuracy of the "Time Out" information. Verification of the correct person, site, and procedure were performed and confirmed by me, the nursing staff, and the patient. "Time-out" conducted as per Joint Commission's Universal Protocol (UP.01.01.01). Time: 0830  Description of Procedure:          Target Area: Caudal Epidural Canal Approach: Midline approach Area Prepped: Entire Posterior Sacrococcygeal Region DuraPrep (Iodine Povacrylex [0.7% available iodine] and Isopropyl Alcohol, 74% w/w) Safety Precautions: Aspiration looking for blood return was conducted prior to all injections. At no point did I inject any substances, as a needle was being advanced. No attempts were made at seeking any paresthesias. Safe injection practices and needle disposal techniques used. Medications properly checked for expiration dates. SDV (single dose vial) medications used. Description of the Procedure: Protocol guidelines were followed. The patient was placed in position over the fluoroscopy table. Patient assessed for comfort and pressure points before starting procedure. The target area was identified and the area prepped in the usual manner. Skin & deeper tissues  infiltrated with local anesthetic. Appropriate amount of time allowed to pass for local anesthetics to take effect. The epidural needle was then advanced to the target area, via the sacral hiatus, between the sacral cornu. Proper needle placement secured. Negative aspiration confirmed. Step  1: Epidurogram performed by slowly injecting a non-ionic, water-soluble, hypoallergenic, myelogram-compatible radiological contrast. Defect identified and Racz catheter advanced slowly next to rest proximal to it without attempting to perforate or puncture the defect. At this point, the epidural needle was removed. Step 2: Contrast was again injected, this time thru the catheter, under live fluoroscopy, closely observing for vascular uptake or intrathecal spread. Step 3: Once no vascular uptake or intrathecal spread was confirmed, a 2 mL test-dose using 2% PF-Lidocaine with 1:200,000 Epinephrine was injected thru the catheter, while closely monitoring for an increase in heart rate or evidence of spinal anesthesia. Step 4: After waiting over 5 minutes, the patient was assessed for evidence of a spinal block. Step 5: Once I had confirmed that there was no vascular uptake or evidence of intrathecal spread, I then slowly injected 1,500 Units of hyaluronidase, carefully monitoring for allergic reactions. I then waited 10 minutes, using this time to secure the catheter using a sterile benzoin tincture swab and (51m x 100 mm) steri-strip. After confirming vitals to be stable, the patient was transferred to the recovery area where Mr. JLisenbeewas kept under constant observation and monitored as per post-sedation protocol. Step 6: After the 10 minutes, I proceeded to slowly inject a solution containing 0.2% MPF-Ropivacaine (4 mL) + 0.9% PF-NSS (5 mL) + SDV Triamcinolone 40 mg/mL (1 mL), in intermittent fashion, asking for systemic symptoms every 0.5cc of injectate. Step 7:  30 minutes later, a neurological exam was conducted for any  evidence of a spinal blockade. Step 8:  After confirming the absence of anesthesia, I slowly injected the 10% PF-Hypertonic Saline, stopping to assess, any time the patient described any discomfort. Once the procedure was completed, I removed the catheter, taking time to show the patient its spring tip, and demonstrating to the patient that none had been left behind. EBL: None Materials & Medications used:  1. Racz Tun-L-Kath ([place]ed, [place] Catheter. (or similar)(Perifix 20Gx100cm) 2. 2" Foam Tape 3. Benzoin tincture swab 4. Steri-Strip (166mx 100 mm) 5. Non-occlusive Transparent Dressing (3MT TegadermT) 6. Bacteriostatic Filter for Epidural Catheter 7. Epidural Kit for 20G catheter Needle(s) used: 20g - 10cm, Tuohy-style epidural needle   Medications used:  1. Skin infiltration: 2.0% Lidocaine (1075m2. Test-dose: 1.5 % Lidocaine w/ Epi 1:200,000 (5ml21m. Epidural Steroid injection:  A). Steroid: Triamcinolone 40 mg/mL (SDV) (1ml)71m. Local Anesthetic: 0.2% PF-Ropivacaine (2 mg/mL) (4 mL) C). Dilution agent: 0.9% PF-NSS (injectable saline) (5 mL) 6. Neurolytic Agent: 10% PF-Sodium Chloride (Hypertonic Saline) (10ml)91m.4% PF-Sodium Chloride (4.273mL) 59m-Sterile H2O (5.727mL) =2m PF-Sodium Chloride (10mL)] 754mar softening agent: Hyaluronidase (150 units/mL) x (10 mL) = 1500 Units 8. Radiological Contrast Media: Isovue-M 200 (10 mL)  Vitals:   12/18/21 0936 12/18/21 0947 12/18/21 1006 12/18/21 1014  BP: 123/72 121/65 110/84 112/83  Pulse:      Resp: _0 Temp:      TempSrc:      SpO2: 99% 100% 99% 98%  Weight:      Height:        Start Time: 0830 hrs. End Time: 0855 hrs. Epidurogram:  Epidurogram flow pattern demonstrated a restricted low at the level of the surgery. The epidural catheter was placed next to this restriction without attempting to perforate it. Adequate opening of the left S1 NR region.  Materials:  Needle(s) Type: Epidural  needle Gauge: 20G Length: 3.5-in Medication(s): Please see orders for medications and dosing details.  Imaging Guidance (Spinal):          Type of Imaging Technique: Fluoroscopy Guidance (Spinal) Indication(s): Assistance in needle guidance and placement for procedures requiring needle placement in or near specific anatomical locations not easily accessible without such assistance. Exposure Time: Please see nurses notes. Contrast: Before injecting any contrast, we confirmed that the patient did not have an allergy to iodine, shellfish, or radiological contrast. Once satisfactory needle placement was completed at the desired level, radiological contrast was injected. Contrast injected under live fluoroscopy. No contrast complications. See chart for type and volume of contrast used. Fluoroscopic Guidance: I was personally present during the use of fluoroscopy. "Tunnel Vision Technique" used to obtain the best possible view of the target area. Parallax error corrected before commencing the procedure. "Direction-depth-direction" technique used to introduce the needle under continuous pulsed fluoroscopy. Once target was reached, antero-posterior, oblique, and lateral fluoroscopic projection used confirm needle placement in all planes. Images permanently stored in EMR. Interpretation: I personally interpreted the imaging intraoperatively. Adequate needle placement confirmed in multiple planes. Appropriate spread of contrast into desired area was observed. No evidence of afferent or efferent intravascular uptake. No intrathecal or subarachnoid spread observed. Permanent images saved into the patient's record.  Antibiotic Prophylaxis:   Anti-infectives (From admission, onward)    Start     Dose/Rate Route Frequency Ordered Stop   12/18/21 0750  ceFAZolin (ANCEF) IVPB 1 g/50 mL premix        1 g 100 mL/hr over 30 Minutes Intravenous 30 min pre-op 12/18/21 0750 12/18/21 0921      Indication(s):  None identified  Post-operative Assessment:  Post-procedure Vital Signs:  Pulse/HCG Rate: (!) 10271 Temp: (!) 97.4 F (36.3 C) Resp: 17 BP: 112/83 SpO2: 98 %  EBL: None  Complications: No immediate post-treatment complications observed by team, or reported by patient.  Note: The patient tolerated the entire procedure well. A repeat set of vitals were taken after the procedure and the patient was kept under observation following institutional policy, for this type of procedure. Post-procedural neurological assessment was performed, showing return to baseline, prior to discharge. The patient was provided with post-procedure discharge instructions, including a section on how to identify potential problems. Should any problems arise concerning this procedure, the patient was given instructions to immediately contact us, at any time, without hesitation. In any case, we plan to contact the patient by telephone for a follow-up status report regarding this interventional procedure.  Comments:  No additional relevant information.  Plan of Care  Orders:  Orders Placed This Encounter  Procedures   Racz (One Day)    Equipment: Epidural Tray; Racz Catheter; 10% Hypertonic Saline; Hyaluronidase; 3/4" Steri-strips; 4x4 Sterile Gauze pack.    Scheduling Instructions:     Procedure: RACZ Epidural Lysis of Adhesions     Side: Midline     Sedation: Moderate Conscious Sedation     Timeframe: Today   DG PAIN [facility] C-ARM 1-60 MIN NO REPORT    Intraoperative interpretation by procedural physician at [facility].    Standing Status:   Standing    Number of Occurrences:   1    Order Specific Question:   Reason for exam:    Answer:   Assistance in needle guidance and placement for procedures requiring needle placement in or near specific anatomical locations not easily accessible without such assistance.   Informed Consent Details: Physician/Practitioner Attestation; Transcribe to consent form  and obtain patient signature    Nursing Order: Transcribe to  consent form and obtain patient signature. Note: Always confirm laterality of pain with Mr. Ehmke, before procedure.    Order Specific Question:   Physician/Practitioner attestation of informed consent for procedure/surgical case    Answer:   I, the physician/practitioner, attest that I have discussed with the patient the benefits, risks, side effects, alternatives, likelihood of achieving goals and potential problems during recovery for the procedure that I have provided informed consent.    Order Specific Question:   Procedure    Answer:   RACZ Procedure (Epidural Lysis of Adhesions)    Order Specific Question:   Physician/Practitioner performing the procedure    Answer:   Thierno Hun A. Dossie Arbour, MD    Order Specific Question:   Indication/Reason    Answer:   Chronic low back pain and lower extremity pain secondary to epidural fibrosis associated with a failed back surgical syndrome   Provide equipment / supplies at bedside    "Epidural Tray" (Disposable  single use) Catheter: NOT required    Standing Status:   Standing    Number of Occurrences:   1    Order Specific Question:   Specify    Answer:   Epidural Tray   Chronic Opioid Analgesic:  Oxycodone IR 10 mg, 1 tab PO q 6 hrs (40 mg/day of oxycodone) MME/day: 60 mg/day.   Medications ordered for procedure: Meds ordered this encounter  Medications   iohexol (OMNIPAQUE) 180 MG/ML injection 10 mL    Must be Myelogram-compatible. If not available, you may substitute with a water-soluble, non-ionic, hypoallergenic, myelogram-compatible radiological contrast medium.   lidocaine (XYLOCAINE) 2 % (with pres) injection 400 mg   lactated ringers infusion   midazolam (VERSED) 5 MG/5ML injection 0.5-2 mg    Make sure Flumazenil is available in the pyxis when using this medication. If oversedation occurs, administer 0.2 mg IV over 15 sec. If after 45 sec no response, administer 0.2 mg  again over 1 min; may repeat at 1 min intervals; not to exceed 4 doses (1 mg)   fentaNYL (SUBLIMAZE) injection 25-50 mcg    Make sure Narcan is available in the pyxis when using this medication. In the event of respiratory depression (RR< 8/min): Titrate NARCAN (naloxone) in increments of 0.1 to 0.2 mg IV at 2-3 minute intervals, until desired degree of reversal.   ceFAZolin (ANCEF) IVPB 1 g/50 mL premix    Order Specific Question:   Antibiotic Indication:    Answer:   Surgical Prophylaxis   lidocaine-EPINEPHrine (XYLOCAINE W/EPI) 2 %-1:200000 (PF) injection 10 mL    If 2% is unavailable, may be substituted with 1.5%, but must be preservative-free. If 1:200,000 concentration of epinephrine is not available, it may be substituted with 1:100,000. Notify physician of changes, before procedure.   ropivacaine (PF) 2 mg/mL (0.2%) (NAROPIN) injection 4 mL   triamcinolone acetonide (KENALOG-40) injection 40 mg   hyaluronidase Human (HYLENEX) injection 1,500 Units    10 mL of the 150 Units/mL   sodium chloride hypertonic 10% epidural injection    For Racz Epidural Lysis of Adhesions.   sodium chloride flush (NS) 0.9 % injection 4 mL   Medications administered: We administered iohexol, lidocaine, lactated ringers, midazolam, fentaNYL, ceFAZolin, lidocaine-EPINEPHrine, ropivacaine (PF) 2 mg/mL (0.2%), triamcinolone acetonide, hyaluronidase Human, sodium chloride hypertonic 10% epidural injection, and sodium chloride flush.  See the medical record for exact dosing, route, and time of administration.  Follow-up plan:   Return in about 2 weeks (around 01/01/2022) for Proc-day (T,Th), (VV), (PPE).  Interventional Therapies  Risk  Complexity Considerations:   Estimated body mass index is 24.54 kg/m as calculated from the following:   Height as of this encounter: _0  (1.778 m).   Weight as of this encounter: 171 lb (77.6 kg).    WNL   Planned  Pending:   Therapeutic/palliative left Racz  procedure #7    Under consideration:   Diagnostic bilateral lumbar facet MBB #1  Possible bilateral lumbar facet RFA  Diagnostic bilateral sacroiliac joint block #1  Possible bilateral sacroiliac joint RFA  Possible spinal cord stimulator trial    Completed:   Therapeutic right L2-3 LESI x1 (08/29/2020)  Diagnostic left lumbar facet MBB x1 (03/21/2020)  Diagnostic left SI block x1 (03/21/2020)  Therapeutic/palliative right lateral epicondyle (elbow) injection x2 (01/21/2021)  Therapeutic/palliative left Racz procedure x6 (targeting the left S2, S3 area) (06/05/2021) (0/0/90/90)    Therapeutic  Palliative (PRN) options:   Palliative right lateral epicondyle (elbow) injection #2  Palliative left Racz procedure #7 (targeting the left S2, S3 area)      Recent Visits Date Type Provider Dept  11/05/21 Office Visit Milinda Pointer, MD [facility]  Showing recent visits within past 90 days and meeting all other requirements Today's Visits Date Type Provider Dept  12/18/21 Procedure visit Milinda Pointer, MD [facility]  Showing today's visits and meeting all other requirements Future Appointments Date Type Provider Dept  01/01/22 Appointment Milinda Pointer, MD [facility]  02/04/22 Appointment Milinda Pointer, MD [facility]  Showing future appointments within next 90 days and meeting all other requirements  Disposition: Discharge home  Discharge (Date  Time): 12/18/2021; 1016 hrs.   Primary Care Physician: Donnamarie Rossetti, PA-C Location: [facility] Note by: Gaspar Cola, MD Date: 12/18/2021; Time: 11:30 AM  Disclaimer:  Medicine is not an Chief Strategy Officer. The only guarantee in medicine is that nothing is guaranteed. It is important to note that the decision to proceed with this intervention was based on the information collected from the patient. The Data and conclusions were drawn from the  patient's questionnaire, the interview, and the physical examination. Because the information was provided in large part by the patient, it cannot be guaranteed that it has not been purposely or unconsciously manipulated. Every effort has been made to obtain as much relevant data as possible for this evaluation. It is important to note that the conclusions that lead to this procedure are derived in large part from the available data. Always take into account that the treatment will also be dependent on availability of resources and existing treatment guidelines, considered by other Pain Management Practitioners as being common knowledge and practice, at the time of the intervention. For Medico-Legal purposes, it is also important to point out that variation in procedural techniques and pharmacological choices are the acceptable norm. The indications, contraindications, technique, and results of the above procedure should only be interpreted and judged by a Board-Certified Interventional Pain Specialist with extensive familiarity and expertise in the same exact procedure and technique.

## 2021-12-18 NOTE — Telephone Encounter (Signed)
Spoke with Shawna Orleans at [facility] and she confirms that the patient picked up #120 oxycodone yesterday 6-14 and has a prescription on file to be filled 7-14. 2023.  Dr Laban Emperor notified.  Called [facility] to verify that they do not have any precriptions on file for this patient.

## 2021-12-18 NOTE — Progress Notes (Addendum)
0847 test dose  0852 HYLENEX 1500 units

## 2021-12-18 NOTE — Patient Instructions (Signed)
____________________________________________________________________________________________  Virtual Visits   What is a "Virtual Visit"? It is a healthcare communication encounter (medical visit) that takes place on real time (NOT TEXT or E-MAIL) over the telephone or computer device (desktop, laptop, tablet, smart phone, etc.). It allows for more location flexibility between the patient and the healthcare provider.  Who decides when these types of visits will be used? The physician.  Who is eligible for these types of visits? Only those patients that can be reliably reached over the telephone.  What do you mean by reliably? We do not have time to call everyone multiple times, therefore those that tend to screen calls and then call back later are not suitable candidates for this system. We understand how people are reluctant to pickup on "unknown" calls, therefore, we suggest adding our telephone numbers to your list of "CONTACT(s)". This way, you should be able to readily identify our calls when you receive one. All of our numbers are available below.   Who is not eligible? This option is not available for medication management encounters, specially for controlled substances. Patients on pain medications that fall under the category of controlled substances have to come in for "Face-to-Face" encounters. This is required for mandatory monitoring of these substances. You may be asked to provide a sample for an unannounced urine drug screening test (UDS), and we will need to count your pain pills. Not bringing your pills to be counted may result in no refill. Obviously, neither one of these can be done over the phone.  When will this type of visits be used? You can request a virtual visit whenever you are physically unable to attend a regular appointment. The decision will be made by the physician (or healthcare provider) on a case by case basis.   At what time will I be called? This is an  excellent question. The providers will try to call you whenever they have time available. Do not expect to be called at any specific time. The secretaries will assign you a time for your virtual visit appointment, but this is done simply to keep a list of those patients that need to be called, but not for the purpose of keeping a time schedule. Be advised that the call may come in anytime during the day, between the hours of 8:00 AM and 8::00 PM, depending on provider availability. We do understand that the system is not perfect. If you are unable to be available that day on a moments notice, then request an "in-person" appointment rather than a "virtual visit".  Can I request my medication visits to be "Virtual"? Yes you may request it, but the decision is entirely up to the healthcare provider. Control substances require specific monitoring that requires Face-to-Face encounters. The number of encounters  and the extent of the monitoring is determined on a case by case basis.  Add a new contact to your smart phone and label it "PAIN CLINIC" Under this contact add the following numbers: Main: [phone] (Official Contact Number) Nurses: [phone] (These are outgoing only calling systems. Do not call this number.) Dr. Inioluwa Boulay: [phone] or [phone] (Outgoing calls only. Do not call this number.)  ____________________________________________________________________________________________   ____________________________________________________________________________________________  Post-Procedure Discharge Instructions  Instructions: Apply ice:  Purpose: This will minimize any swelling and discomfort after procedure.  When: Day of procedure, as soon as you get home. How: Fill a plastic sandwich bag with crushed ice. Cover it with a small towel and apply to   injection site. How long: (15 min on, 15 min off) Apply for 15 minutes then remove x 15 minutes.  Repeat sequence on day  of procedure, until you go to bed. Apply heat:  Purpose: To treat any soreness and discomfort from the procedure. When: Starting the next day after the procedure. How: Apply heat to procedure site starting the day following the procedure. How long: May continue to repeat daily, until discomfort goes away. Food intake: Start with clear liquids (like water) and advance to regular food, as tolerated.  Physical activities: Keep activities to a minimum for the first 8 hours after the procedure. After that, then as tolerated. Driving: If you have received any sedation, be responsible and do not drive. You are not allowed to drive for 24 hours after having sedation. Blood thinner: (Applies only to those taking blood thinners) You may restart your blood thinner 6 hours after your procedure. Insulin: (Applies only to Diabetic patients taking insulin) As soon as you can eat, you may resume your normal dosing schedule. Infection prevention: Keep procedure site clean and dry. Shower daily and clean area with soap and water. Post-procedure Pain Diary: Extremely important that this be done correctly and accurately. Recorded information will be used to determine the next step in treatment. For the purpose of accuracy, follow these rules: Evaluate only the area treated. Do not report or include pain from an untreated area. For the purpose of this evaluation, ignore all other areas of pain, except for the treated area. After your procedure, avoid taking a long nap and attempting to complete the pain diary after you wake up. Instead, set your alarm clock to go off every hour, on the hour, for the initial 8 hours after the procedure. Document the duration of the numbing medicine, and the relief you are getting from it. Do not go to sleep and attempt to complete it later. It will not be accurate. If you received sedation, it is likely that you were given a medication that may cause amnesia. Because of this, completing  the diary at a later time may cause the information to be inaccurate. This information is needed to plan your care. Follow-up appointment: Keep your post-procedure follow-up evaluation appointment after the procedure (usually 2 weeks for most procedures, 6 weeks for radiofrequencies). DO NOT FORGET to bring you pain diary with you.   Expect: (What should I expect to see with my procedure?) From numbing medicine (AKA: Local Anesthetics): Numbness or decrease in pain. You may also experience some weakness, which if present, could last for the duration of the local anesthetic. Onset: Full effect within 15 minutes of injected. Duration: It will depend on the type of local anesthetic used. On the average, 1 to 8 hours.  From steroids (Applies only if steroids were used): Decrease in swelling or inflammation. Once inflammation is improved, relief of the pain will follow. Onset of benefits: Depends on the amount of swelling present. The more swelling, the longer it will take for the benefits to be seen. In some cases, up to 10 days. Duration: Steroids will stay in the system x 2 weeks. Duration of benefits will depend on multiple posibilities including persistent irritating factors. Side-effects: If present, they may typically last 2 weeks (the duration of the steroids). Frequent: Cramps (if they occur, drink Gatorade and take over-the-counter Magnesium 450-500 mg once to twice a day); water retention with temporary weight gain; increases in blood sugar; decreased immune system response; increased appetite. Occasional: Facial flushing (red,   warm cheeks); mood swings; menstrual changes. Uncommon: Long-term decrease or suppression of natural hormones; bone thinning. (These are more common with higher doses or more frequent use. This is why we prefer that our patients avoid having any injection therapies in other practices.)  Very Rare: Severe mood changes; psychosis; aseptic necrosis. From procedure: Some  discomfort is to be expected once the numbing medicine wears off. This should be minimal if ice and heat are applied as instructed.  Call if: (When should I call?) You experience numbness and weakness that gets worse with time, as opposed to wearing off. New onset bowel or bladder incontinence. (Applies only to procedures done in the spine)  Emergency Numbers: Durning business hours (Monday - Thursday, 8:00 AM - 4:00 PM) (Friday, 9:00 AM - 12:00 Noon): [phone] After hours: [phone] NOTE: If you are having a problem and are unable connect with, or to talk to a provider, then go to your nearest urgent care or emergency department. If the problem is serious and urgent, please call 911. ____________________________________________________________________________________________   

## 2021-12-18 NOTE — Progress Notes (Signed)
4696   Pt was moved to the recovery room. His pain level was 5 Vital  signed was taken and 2 liters of O2 was applied for comfort measures.   2952    Dr Laban Emperor pushed Triamcinolone 40mg , O.2% MPF-Ropivacaine 9ml,    0.9% PF-NSS. Pt tolerated well. No pain at this time.  11m    After 8413 Dr.Naveira slowly pushed 10% Hypertonic saline.   He was given 21ml of versed for pain and 5 min later given another          .            46ml of versed. Catheter was removed and intact. No pain on discharge.

## 2021-12-18 NOTE — Progress Notes (Signed)
Safety precautions to be maintained throughout the outpatient stay will include: orient to surroundings, keep bed in low position, maintain call bell within reach at all times, provide assistance with transfer out of bed and ambulation.  

## 2021-12-19 ENCOUNTER — Telehealth: Payer: Self-pay

## 2021-12-19 NOTE — Telephone Encounter (Signed)
Post procedure phone call.  LM 

## 2021-12-29 NOTE — Progress Notes (Signed)
Patient: Derek [name]  Service Category: E/M  Provider: Gaspar Cola, MD  DOB: [birth year]  DOS: 01/01/2022  Location: Office  MRN: [id number]  Setting: Ambulatory outpatient  Referring Provider: Donnamarie Rossetti,*  Type: Established Patient  Specialty: Interventional Pain Management  PCP: Donnamarie Rossetti, PA-C  Location: Remote location  Delivery: TeleHealth     Virtual Encounter - Pain Management PROVIDER NOTE: Information contained herein reflects review and annotations entered in association with encounter. Interpretation of such information and data should be left to medically-trained personnel. Information provided to patient can be located elsewhere in the medical record under "Patient Instructions". Document created using STT-dictation technology, any transcriptional errors that may result from process are unintentional.    Contact & Pharmacy Preferred: [phone] Home: [phone] (home) Mobile: [phone] (mobile) E-mail: [email or web address] .[place], [place] - [place] [address] Phone: [phone] Fax: [phone]  [id number] [place] [place]: [phone]: [phone]  Pre-screening  Mr. JMundayoffered "in-person" vs "virtual" encounter. He indicated preferring virtual for this encounter.   Reason COVID-19*  Social distancing based on CDC and AMA recommendations.   I contacted JLeonia Reeveson 01/01/2022 via telephone.      I clearly identified myself as FGaspar Cola MD. I verified that I was speaking with the correct person using two identifiers (Name: Derek [name] and date of birth: 8May 01, 1982.  Consent I sought verbal advanced consent from JLeonia Reevesfor virtual visit interactions. I informed Mr. JMoreeof possible security and privacy concerns, risks, and limitations associated with providing  "not-in-person" medical evaluation and management services. I also informed Mr. JGolabof the availability of "in-person" appointments. Finally, I informed him that there would be a charge for the virtual visit and that he could be  personally, fully or partially, financially responsible for it. Mr. JTibbsexpressed understanding and agreed to proceed.   Historic Elements   Mr. JARLEIGH ODOWDis a 41y.o. year old, male patient evaluated today after our last contact on 12/18/2021. Mr. JDittmar has a past medical history of Anxiety, Arthritis, and GERD (gastroesophageal reflux disease). He also  has a past surgical history that includes Back surgery; HAND REIMPLANTED; and Lumbar fusion (11/14). Mr. JBaerhas a current medication list which includes the following prescription(s): calcium carb-cholecalciferol, cyclobenzaprine, esomeprazole, meloxicam, oxycodone hcl, [START ON 01/16/2022] oxycodone hcl, oxycodone hcl, tizanidine, and diclofenac sodium. He  reports that he has been smoking cigarettes. He has a 16.00 pack-year smoking history. He has never used smokeless tobacco. He reports current alcohol use. He reports that he does not use drugs. Mr. JLeasureis allergic to hydrocodone, amoxicillin, and vicodin [hydrocodone-acetaminophen].   HPI  Today, he is being contacted for a post-procedure assessment.  He refers that the pain in the left lower extremity is no longer going all the way down into his foot however, he is having more pain in the lower back.  Today I have requested that he do a PTherapist, artwhich he indicates did cause some discomfort around the area of the right hip, but it was mainly positive for left sacroiliac joint pain.  Hyperextension and rotation maneuver also trigger worsening of that low back pain.  For this reason, I have recommended to have him come in for a diagnostic/therapeutic bilateral lumbar facet block under fluoroscopic guidance and IV  sedation.  The plan was shared with the  patient who understood and agree.  Post-procedure evaluation    Procedure:          Anesthesia, Analgesia, Anxiolysis:  Type: Therapeutic Percutaneous Epidural Neuroplasty and Lysis of Adhesions (RACZ Procedure)   #7   Region: Caudal Level: Sacrococcygeal   Laterality: Midline aiming at the left  Anesthesia: Local (1-2% Lidocaine)  Anxiolysis: IV  Sedation: Moderate  Guidance: Fluoroscopy           Position: Prone   1. Epidural fibrosis   2. Radicular pain of lumbosacral region   3. Failed back surgical syndrome (x3)   4. L5-S1 pseudoarthrosis   5. DDD (degenerative disc disease), lumbosacral   6. Chronic low back pain (1ry area of Pain) (Bilateral) (L>R) w/ sciatica (Bilateral)   7. Chronic lower extremity pain (2ry area of Pain) (Bilateral) (L>R)   8. Lumbar postlaminectomy syndrome    NAS-11 Pain score:   Pre-procedure: 3 /10   Post-procedure: 0-No pain/10     Effectiveness:  Initial hour after procedure: 0 %. Subsequent 4-6 hours post-procedure: 0 %. Analgesia past initial 6 hours: 50 %. Ongoing improvement:  Analgesic: The patient refers that the procedure did provide him with some relief of the left lower extremity pain where he no longer is having pain going all the way down into his foot.  However, he is having a flareup of his low back pain, which I believe may be secondary to facet disease and the hardware that he has in his back.  In the past we have done some facet blocks which have provided her with some benefit.  I will go ahead and schedule him to return to do some medial branch blocks in the hope that that will provide him with some relief that pain. Function: Somewhat improved ROM: Somewhat improved  Pharmacotherapy Assessment   Opioid Analgesic: Oxycodone IR 10 mg, 1 tab PO q 6 hrs (40 mg/day of oxycodone) MME/day: 60 mg/day.   Monitoring: [place] PMP: PDMP reviewed during this encounter.       Pharmacotherapy: No side-effects or adverse reactions  reported. Compliance: No problems identified. Effectiveness: Clinically acceptable. Plan: Refer to "POC". UDS:  Summary  Date Value Ref Range Status  02/19/2021 Note  Final    Comment:    ==================================================================== [employer] Select 13 (MW) ==================================================================== Test                             Result       Flag       Units  Drug Present and Declared for Prescription Verification   Oxycodone                      729          EXPECTED   ng/mg creat   Oxymorphone                    3202         EXPECTED   ng/mg creat   Noroxycodone                   1253         EXPECTED   ng/mg creat   Noroxymorphone                 627          EXPECTED   ng/mg creat  Sources of oxycodone are scheduled prescription medications.    Oxymorphone, noroxycodone, and noroxymorphone are expected    metabolites of oxycodone. Oxymorphone is also available as a    scheduled prescription medication.  ==================================================================== Test                      Result    Flag   Units      Ref Range   Creatinine              45               mg/dL      >=20 ==================================================================== Declared Medications:  The flagging and interpretation on this report are based on the  following declared medications.  Unexpected results may arise from  inaccuracies in the declared medications.   **Note: The testing scope of this panel includes these medications:   Oxycodone   **Note: The testing scope of this panel does not include the  following reported medications:   Calcium  Cyclobenzaprine (Flexeril)  Meloxicam (Mobic)  Tizanidine (Zanaflex)  Topical Diclofenac  Vitamin D ==================================================================== For clinical consultation, please call [phone]. ====================================================================      Laboratory Chemistry Profile   Renal Lab Results  Component Value Date   BUN 11 01/16/2019   CREATININE 1.08 01/16/2019   BCR 10 01/16/2019   GFRAA 99 01/16/2019   GFRNONAA 86 01/16/2019    Hepatic Lab Results  Component Value Date   AST 24 01/16/2019   ALBUMIN 4.6 01/16/2019   ALKPHOS 108 01/16/2019    Electrolytes Lab Results  Component Value Date   NA 143 01/16/2019   K 4.0 01/16/2019   CL 105 01/16/2019   CALCIUM 10.2 01/16/2019   MG 2.0 01/16/2019    Bone Lab Results  Component Value Date   25OHVITD1 39 01/16/2019   25OHVITD2 1.2 01/16/2019   25OHVITD3 38 01/16/2019    Inflammation (CRP: Acute Phase) (ESR: Chronic Phase) Lab Results  Component Value Date   CRP 4 01/16/2019   ESRSEDRATE 29 (H) 01/16/2019         Note: Above Lab results reviewed.  Imaging  DG PAIN CLINIC C-ARM 1-60 MIN NO REPORT Fluoro was used, but no Radiologist interpretation will be provided.  Please refer to "NOTES" tab for provider progress note.  Assessment  The primary encounter diagnosis was L5-S1 pseudoarthrosis. Diagnoses of Lumbar facet syndrome (Bilateral) (L>R) and Lumbar postlaminectomy syndrome were also pertinent to this visit.  Plan of Care  Problem-specific:  No problem-specific Assessment & Plan notes found for this encounter.  Mr. DOUGLAS SMOLINSKY has a current medication list which includes the following long-term medication(s): calcium carb-cholecalciferol, esomeprazole, meloxicam, oxycodone hcl, [START ON 01/16/2022] oxycodone hcl, oxycodone hcl, and tizanidine.  Pharmacotherapy (Medications Ordered): No orders of the defined types were placed in this encounter.  Orders:  Orders Placed This Encounter  Procedures   LUMBAR FACET(MEDIAL BRANCH NERVE BLOCK) MBNB    Standing Status:   Future    Standing Expiration Date:   04/03/2022    Scheduling Instructions:     Procedure:  Lumbar facet block (AKA.: Lumbosacral medial branch nerve block)     Side: Bilateral     Level: L3-4 & L5-S1 Facets (L2, L3, L4, L5, & S1 Medial Branch Nerves)     Sedation: Patient's choice.     Timeframe: ASAA    Order Specific Question:   Where will this procedure be performed?    Answer:  [facility]   Follow-up plan:   Return for (ECT) procedure: (B) L-FCT Blk #2.     Interventional Therapies  Risk  Complexity Considerations:   Estimated body mass index is 24.54 kg/m as calculated from the following:   Height as of this encounter: '5\' 10"'  (1.778 m).   Weight as of this encounter: 171 lb (77.6 kg).    WNL   Planned  Pending:   Therapeutic bilateral lumbar facet block #2    Under consideration:   Diagnostic bilateral lumbar facet MBB #1  Possible bilateral lumbar facet RFA  Diagnostic bilateral sacroiliac joint block #1  Possible bilateral sacroiliac joint RFA  Possible spinal cord stimulator trial    Completed:   Therapeutic right L2-3 LESI x1 (08/29/2020)  Diagnostic left lumbar facet MBB x1 (03/21/2020)  Diagnostic left SI block x1 (03/21/2020)  Therapeutic/palliative right lateral epicondyle (elbow) injection x2 (01/21/2021)  Therapeutic/palliative left Racz procedure x7 (targeting the left S2, S3 area) (12/18/2021) (0/0/50/90) complete relief of the radicular component.  No benefit to the back pain.   Therapeutic  Palliative (PRN) options:   Palliative right lateral epicondyle (elbow) injection #2  Palliative left Racz procedure #7 (targeting the left S2, S3 area)     Recent Visits Date Type Provider Dept  12/18/21 Procedure visit Milinda Pointer, MD [facility]  11/05/21 Office Visit Milinda Pointer, MD [facility]  Showing recent visits within past 90 days and meeting all other requirements Today's Visits Date Type Provider Dept  01/01/22 Office Visit Milinda Pointer, MD [facility]  Showing today's visits and  meeting all other requirements Future Appointments Date Type Provider Dept  02/04/22 Appointment Milinda Pointer, MD [facility]  Showing future appointments within next 90 days and meeting all other requirements  I discussed the assessment and treatment plan with the patient. The patient was provided an opportunity to ask questions and all were answered. The patient agreed with the plan and demonstrated an understanding of the instructions.  Patient advised to call back or seek an in-person evaluation if the symptoms or condition worsens.  Duration of encounter: 22 minutes.  Note by: Gaspar Cola, MD Date: 01/01/2022; Time: 5:05 PM

## 2022-01-01 ENCOUNTER — Ambulatory Visit: Payer: BC Managed Care – PPO | Attending: Pain Medicine | Admitting: Pain Medicine

## 2022-01-01 DIAGNOSIS — M960 Pseudarthrosis after fusion or arthrodesis: Secondary | ICD-10-CM

## 2022-01-01 DIAGNOSIS — M961 Postlaminectomy syndrome, not elsewhere classified: Secondary | ICD-10-CM | POA: Diagnosis not present

## 2022-01-01 DIAGNOSIS — M47816 Spondylosis without myelopathy or radiculopathy, lumbar region: Secondary | ICD-10-CM | POA: Diagnosis not present

## 2022-01-01 NOTE — Patient Instructions (Signed)
______________________________________________________________________  Preparing for Procedure with Sedation  NOTICE: Due to recent regulatory changes, starting on February 03, 2021, procedures requiring intravenous (IV) sedation will no longer be performed at the [facility].  These types of procedures are required to be performed at [facility] ambulatory surgery facility.  We are very sorry for the inconvenience.  Procedure appointments are limited to planned procedures: No Prescription Refills. No disability issues will be discussed. No medication changes will be discussed.  Instructions: Oral Intake: Do not eat or drink anything for at least 8 hours prior to your procedure. (Exception: Blood Pressure Medication. See below.) Transportation: A driver is required. You may not drive yourself after the procedure. Blood Pressure Medicine: Do not forget to take your blood pressure medicine with a sip of water the morning of the procedure. If your Diastolic (lower reading) is above 100 mmHg, elective cases will be cancelled/rescheduled. Blood thinners: These will need to be stopped for procedures. Notify our staff if you are taking any blood thinners. Depending on which one you take, there will be specific instructions on how and when to stop it. Diabetics on insulin: Notify the staff so that you can be scheduled 1st case in the morning. If your diabetes requires high dose insulin, take only  of your normal insulin dose the morning of the procedure and notify the staff that you have done so. Preventing infections: Shower with an antibacterial soap the morning of your procedure. Build-up your immune system: Take 1000 mg of Vitamin C with every meal (3 times a day) the day prior to your procedure. Antibiotics: Inform the staff if you have a condition or reason that requires you to take antibiotics before dental procedures. Pregnancy: If you are pregnant, call and cancel the procedure. Sickness: If  you have a cold, fever, or any active infections, call and cancel the procedure. Arrival: You must be in the facility at least 30 minutes prior to your scheduled procedure. Children: Do not bring children with you. Dress appropriately: There is always the possibility that your clothing may get soiled. Valuables: Do not bring any jewelry or valuables.  Reasons to call and reschedule or cancel your procedure: (Following these recommendations will minimize the risk of a serious complication.) Surgeries: Avoid having procedures within 2 weeks of any surgery. (Avoid for 2 weeks before or after any surgery). Flu Shots: Avoid having procedures within 2 weeks of a flu shots. (Avoid for 2 weeks before or after immunizations). Barium: Avoid having a procedure within 7-10 days after having had a radiological study involving the use of radiological contrast. (Myelograms, Barium swallow or enema study). Heart attacks: Avoid any elective procedures or surgeries for the initial 6 months after a "Myocardial Infarction" (Heart Attack). Blood thinners: It is imperative that you stop these medications before procedures. Let us know if you if you take any blood thinner.  Infection: Avoid procedures during or within two weeks of an infection (including chest colds or gastrointestinal problems). Symptoms associated with infections include: Localized redness, fever, chills, night sweats or profuse sweating, burning sensation when voiding, cough, congestion, stuffiness, runny nose, sore throat, diarrhea, nausea, vomiting, cold or Flu symptoms, recent or current infections. It is specially important if the infection is over the area that we intend to treat. Heart and lung problems: Symptoms that may suggest an active cardiopulmonary problem include: cough, chest pain, breathing difficulties or shortness of breath, dizziness, ankle swelling, uncontrolled high or unusually low blood pressure, and/or palpitations. If you are    experiencing any of these symptoms, cancel your procedure and contact your primary care physician for an evaluation.  Remember:  Regular Business hours are:  Monday to Thursday 8:00 AM to 4:00 PM  Provider's Schedule: Leona Pressly, MD:  Procedure days: Tuesday and Thursday 7:30 AM to 4:00 PM  Bilal Lateef, MD:  Procedure days: Monday and Wednesday 7:30 AM to 4:00 PM ______________________________________________________________________  ____________________________________________________________________________________________  General Risks and Possible Complications  Patient Responsibilities: It is important that you read this as it is part of your informed consent. It is our duty to inform you of the risks and possible complications associated with treatments offered to you. It is your responsibility as a patient to read this and to ask questions about anything that is not clear or that you believe was not covered in this document.  Patient's Rights: You have the right to refuse treatment. You also have the right to change your mind, even after initially having agreed to have the treatment done. However, under this last option, if you wait until the last second to change your mind, you may be charged for the materials used up to that point.  Introduction: Medicine is not an exact science. Everything in Medicine, including the lack of treatment(s), carries the potential for danger, harm, or loss (which is by definition: Risk). In Medicine, a complication is a secondary problem, condition, or disease that can aggravate an already existing one. All treatments carry the risk of possible complications. The fact that a side effects or complications occurs, does not imply that the treatment was conducted incorrectly. It must be clearly understood that these can happen even when everything is done following the highest safety standards.  No treatment: You can choose not to proceed with the  proposed treatment alternative. The "PRO(s)" would include: avoiding the risk of complications associated with the therapy. The "CON(s)" would include: not getting any of the treatment benefits. These benefits fall under one of three categories: diagnostic; therapeutic; and/or palliative. Diagnostic benefits include: getting information which can ultimately lead to improvement of the disease or symptom(s). Therapeutic benefits are those associated with the successful treatment of the disease. Finally, palliative benefits are those related to the decrease of the primary symptoms, without necessarily curing the condition (example: decreasing the pain from a flare-up of a chronic condition, such as incurable terminal cancer).  General Risks and Complications: These are associated to most interventional treatments. They can occur alone, or in combination. They fall under one of the following six (6) categories: no benefit or worsening of symptoms; bleeding; infection; nerve damage; allergic reactions; and/or death. No benefits or worsening of symptoms: In Medicine there are no guarantees, only probabilities. No healthcare provider can ever guarantee that a medical treatment will work, they can only state the probability that it may. Furthermore, there is always the possibility that the condition may worsen, either directly, or indirectly, as a consequence of the treatment. Bleeding: This is more common if the patient is taking a blood thinner, either prescription or over the counter (example: Goody Powders, Fish oil, Aspirin, Garlic, etc.), or if suffering a condition associated with impaired coagulation (example: Hemophilia, cirrhosis of the liver, low platelet counts, etc.). However, even if you do not have one on these, it can still happen. If you have any of these conditions, or take one of these drugs, make sure to notify your treating physician. Infection: This is more common in patients with a compromised  immune system, either due to disease (example:   diabetes, cancer, human immunodeficiency virus [HIV], etc.), or due to medications or treatments (example: therapies used to treat cancer and rheumatological diseases). However, even if you do not have one on these, it can still happen. If you have any of these conditions, or take one of these drugs, make sure to notify your treating physician. Nerve Damage: This is more common when the treatment is an invasive one, but it can also happen with the use of medications, such as those used in the treatment of cancer. The damage can occur to small secondary nerves, or to large primary ones, such as those in the spinal cord and brain. This damage may be temporary or permanent and it may lead to impairments that can range from temporary numbness to permanent paralysis and/or brain death. Allergic Reactions: Any time a substance or material comes in contact with our body, there is the possibility of an allergic reaction. These can range from a mild skin rash (contact dermatitis) to a severe systemic reaction (anaphylactic reaction), which can result in death. Death: In general, any medical intervention can result in death, most of the time due to an unforeseen complication. ____________________________________________________________________________________________  

## 2022-01-11 NOTE — Progress Notes (Unsigned)
PROVIDER NOTE: Interpretation of information contained herein should be left to medically-trained personnel. Specific patient instructions are provided elsewhere under "Patient Instructions" section of medical record. This document was created in part using STT-dictation technology, any transcriptional errors that may result from this process are unintentional.  Patient: Derek [name] Type: Established DOB: [birth year] MRN: [id number] PCP: Donnamarie Rossetti, PA-C  Service: Procedure DOS: 01/15/2022 Setting: Ambulatory Location: Ambulatory outpatient facility Delivery: Face-to-face Provider: Gaspar Cola, MD Specialty: Interventional Pain Management Specialty designation: 09 Location: Outpatient facility Ref. Prov.: Milinda Pointer, MD    Primary Reason for Visit: Interventional Pain Management Treatment. CC: No chief complaint on file.   Procedure:           Type: Lumbar Facet, Medial Branch Block(s) #2  Laterality: Bilateral  Level: L2, L3, L4, L5, & S1 Medial Branch Level(s). Injecting these levels blocks the L3-4, L4-5 and L5-S1 lumbar facet joints. Imaging: Fluoroscopic guidance Anesthesia: Local anesthesia (1-2% Lidocaine) Anxiolysis: IV Versed         Sedation: None. DOS: 01/15/2022 Performed by: Gaspar Cola, MD  Primary Purpose: Diagnostic/Therapeutic Indications: Low back pain severe enough to impact quality of life or function. No diagnosis found. NAS-11 Pain score:   Pre-procedure:  /10   Post-procedure:  /10     Position / Prep / Materials:  Position: Prone  Prep solution: DuraPrep (Iodine Povacrylex [0.7% available iodine] and Isopropyl Alcohol, 74% w/w) Area Prepped: Posterolateral Lumbosacral Spine (Wide prep: From the lower border of the scapula down to the end of the tailbone and from flank to flank.)  Materials:  Tray: Block Needle(s):  Type: Spinal  Gauge (G): 22  Length: 5-in Qty: 4  Pre-op H&P Assessment:  Derek [name] is a 42  y.o. (year old), male patient, seen today for interventional treatment. He  has a past surgical history that includes Back surgery; HAND REIMPLANTED; and Lumbar fusion (11/14). [name] has a current medication list which includes the following prescription(s): calcium carb-cholecalciferol, cyclobenzaprine, diclofenac sodium, esomeprazole, meloxicam, oxycodone hcl, [START ON 01/16/2022] oxycodone hcl, oxycodone hcl, and tizanidine. His primarily concern today is the No chief complaint on file.  Initial Vital Signs:  Pulse/HCG Rate:    Temp:   Resp:   BP:   SpO2:    BMI: Estimated body mass index is 25.25 kg/m as calculated from the following:   Height as of 12/18/21: 5\' 10"  (1.778 m).   Weight as of 12/18/21: 176 lb (79.8 kg).  Risk Assessment: Allergies: Reviewed. He is allergic to hydrocodone, amoxicillin, and vicodin [hydrocodone-acetaminophen].  Allergy Precautions: None required Coagulopathies: Reviewed. None identified.  Blood-thinner therapy: None at this time Active Infection(s): Reviewed. None identified. Derek [name] is afebrile  Site Confirmation: Derek [name] was asked to confirm the procedure and laterality before marking the site Procedure checklist: Completed Consent: Before the procedure and under the influence of no sedative(s), amnesic(s), or anxiolytics, the patient was informed of the treatment options, risks and possible complications. To fulfill our ethical and legal obligations, as recommended by the American Medical Association's Code of Ethics, I have informed the patient of my clinical impression; the nature and purpose of the treatment or procedure; the risks, benefits, and possible complications of the intervention; the alternatives, including doing nothing; the risk(s) and benefit(s) of the alternative treatment(s) or procedure(s); and the risk(s) and benefit(s) of doing nothing. The patient was provided information about the general risks and possible complications  associated with the procedure. These may include, but are not  limited to: failure to achieve desired goals, infection, bleeding, organ or nerve damage, allergic reactions, paralysis, and death. In addition, the patient was informed of those risks and complications associated to Spine-related procedures, such as failure to decrease pain; infection (i.e.: Meningitis, epidural or intraspinal abscess); bleeding (i.e.: epidural hematoma, subarachnoid hemorrhage, or any other type of intraspinal or peri-dural bleeding); organ or nerve damage (i.e.: Any type of peripheral nerve, nerve root, or spinal cord injury) with subsequent damage to sensory, motor, and/or autonomic systems, resulting in permanent pain, numbness, and/or weakness of one or several areas of the body; allergic reactions; (i.e.: anaphylactic reaction); and/or death. Furthermore, the patient was informed of those risks and complications associated with the medications. These include, but are not limited to: allergic reactions (i.e.: anaphylactic or anaphylactoid reaction(s)); adrenal axis suppression; blood sugar elevation that in diabetics may result in ketoacidosis or comma; water retention that in patients with history of congestive heart failure may result in shortness of breath, pulmonary edema, and decompensation with resultant heart failure; weight gain; swelling or edema; medication-induced neural toxicity; particulate matter embolism and blood vessel occlusion with resultant organ, and/or nervous system infarction; and/or aseptic necrosis of one or more joints. Finally, the patient was informed that Medicine is not an exact science; therefore, there is also the possibility of unforeseen or unpredictable risks and/or possible complications that may result in a catastrophic outcome. The patient indicated having understood very clearly. We have given the patient no guarantees and we have made no promises. Enough time was given to the patient to  ask questions, all of which were answered to the patient's satisfaction. Derek [name] has indicated that he wanted to continue with the procedure. Attestation: I, the ordering provider, attest that I have discussed with the patient the benefits, risks, side-effects, alternatives, likelihood of achieving goals, and potential problems during recovery for the procedure that I have provided informed consent. Date  Time: {CHL [facility]-PAIN TIME CHOICES:21018001}  Pre-Procedure Preparation:  Monitoring: As per clinic protocol. Respiration, ETCO2, SpO2, BP, heart rate and rhythm monitor placed and checked for adequate function Safety Precautions: Patient was assessed for positional comfort and pressure points before starting the procedure. Time-out: I initiated and conducted the "Time-out" before starting the procedure, as per protocol. The patient was asked to participate by confirming the accuracy of the "Time Out" information. Verification of the correct person, site, and procedure were performed and confirmed by me, the nursing staff, and the patient. "Time-out" conducted as per Joint Commission's Universal Protocol (UP.01.01.01). Time:    Description of Procedure:          Laterality: Bilateral. The procedure was performed in identical fashion on both sides. Targeted Levels:  L2, L3, L4, L5, & S1 Medial Branch Level(s)  Safety Precautions: Aspiration looking for blood return was conducted prior to all injections. At no point did we inject any substances, as a needle was being advanced. Before injecting, the patient was told to immediately notify me if he was experiencing any new onset of "ringing in the ears, or metallic taste in the mouth". No attempts were made at seeking any paresthesias. Safe injection practices and needle disposal techniques used. Medications properly checked for expiration dates. SDV (single dose vial) medications used. After the completion of the procedure, all disposable equipment used  was discarded in the proper designated medical waste containers. Local Anesthesia: Protocol guidelines were followed. The patient was positioned over the fluoroscopy table. The area was prepped in the usual manner. The time-out was  completed. The target area was identified using fluoroscopy. A 12-in long, straight, sterile hemostat was used with fluoroscopic guidance to locate the targets for each level blocked. Once located, the skin was marked with an approved surgical skin marker. Once all sites were marked, the skin (epidermis, dermis, and hypodermis), as well as deeper tissues (fat, connective tissue and muscle) were infiltrated with a small amount of a short-acting local anesthetic, loaded on a 10cc syringe with a 25G, 1.5-in  Needle. An appropriate amount of time was allowed for local anesthetics to take effect before proceeding to the next step. Local Anesthetic: Lidocaine 2.0% The unused portion of the local anesthetic was discarded in the proper designated containers. Technical description of process:  L2 Medial Branch Nerve Block (MBB): The target area for the L2 medial branch is at the junction of the postero-lateral aspect of the superior articular process and the superior, posterior, and medial edge of the transverse process of L3. Under fluoroscopic guidance, a Quincke needle was inserted until contact was made with os over the superior postero-lateral aspect of the pedicular shadow (target area). After negative aspiration for blood, 0.5 mL of the nerve block solution was injected without difficulty or complication. The needle was removed intact. L3 Medial Branch Nerve Block (MBB): The target area for the L3 medial branch is at the junction of the postero-lateral aspect of the superior articular process and the superior, posterior, and medial edge of the transverse process of L4. Under fluoroscopic guidance, a Quincke needle was inserted until contact was made with os over the superior  postero-lateral aspect of the pedicular shadow (target area). After negative aspiration for blood, 0.5 mL of the nerve block solution was injected without difficulty or complication. The needle was removed intact. L4 Medial Branch Nerve Block (MBB): The target area for the L4 medial branch is at the junction of the postero-lateral aspect of the superior articular process and the superior, posterior, and medial edge of the transverse process of L5. Under fluoroscopic guidance, a Quincke needle was inserted until contact was made with os over the superior postero-lateral aspect of the pedicular shadow (target area). After negative aspiration for blood, 0.5 mL of the nerve block solution was injected without difficulty or complication. The needle was removed intact. L5 Medial Branch Nerve Block (MBB): The target area for the L5 medial branch is at the junction of the postero-lateral aspect of the superior articular process and the superior, posterior, and medial edge of the sacral ala. Under fluoroscopic guidance, a Quincke needle was inserted until contact was made with os over the superior postero-lateral aspect of the pedicular shadow (target area). After negative aspiration for blood, 0.5 mL of the nerve block solution was injected without difficulty or complication. The needle was removed intact. S1 Medial Branch Nerve Block (MBB): The target area for the S1 medial branch is at the posterior and inferior 6 o'clock position of the L5-S1 facet joint. Under fluoroscopic guidance, the Quincke needle inserted for the L5 MBB was redirected until contact was made with os over the inferior and postero aspect of the sacrum, at the 6 o' clock position under the L5-S1 facet joint (Target area). After negative aspiration for blood, 0.5 mL of the nerve block solution was injected without difficulty or complication. The needle was removed intact.  Once the entire procedure was completed, the treated area was cleaned,  making sure to leave some of the prepping solution back to take advantage of its long term bactericidal properties.  Illustration of the posterior view of the lumbar spine and the posterior neural structures. Laminae of L2 through S1 are labeled. DPRL5, dorsal primary ramus of L5; DPRS1, dorsal primary ramus of S1; DPR3, dorsal primary ramus of L3; FJ, facet (zygapophyseal) joint L3-L4; I, inferior articular process of L4; LB1, lateral branch of dorsal primary ramus of L1; IAB, inferior articular branches from L3 medial branch (supplies L4-L5 facet joint); IBP, intermediate branch plexus; MB3, medial branch of dorsal primary ramus of L3; NR3, third lumbar nerve root; S, superior articular process of L5; SAB, superior articular branches from L4 (supplies L4-5 facet joint also); TP3, transverse process of L3.  There were no vitals filed for this visit.   Start Time:   hrs. End Time:   hrs.  Imaging Guidance (Spinal):          Type of Imaging Technique: Fluoroscopy Guidance (Spinal) Indication(s): Assistance in needle guidance and placement for procedures requiring needle placement in or near specific anatomical locations not easily accessible without such assistance. Exposure Time: Please see nurses notes. Contrast: None used. Fluoroscopic Guidance: I was personally present during the use of fluoroscopy. "Tunnel Vision Technique" used to obtain the best possible view of the target area. Parallax error corrected before commencing the procedure. "Direction-depth-direction" technique used to introduce the needle under continuous pulsed fluoroscopy. Once target was reached, antero-posterior, oblique, and lateral fluoroscopic projection used confirm needle placement in all planes. Images permanently stored in EMR. Interpretation: No contrast injected. I personally interpreted the imaging intraoperatively. Adequate needle placement confirmed in multiple planes. Permanent images saved into the  patient's record.  Antibiotic Prophylaxis:   Anti-infectives (From admission, onward)    None      Indication(s): None identified  Post-operative Assessment:  Post-procedure Vital Signs:  Pulse/HCG Rate:    Temp:   Resp:   BP:   SpO2:    EBL: None  Complications: No immediate post-treatment complications observed by team, or reported by patient.  Note: The patient tolerated the entire procedure well. A repeat set of vitals were taken after the procedure and the patient was kept under observation following institutional policy, for this type of procedure. Post-procedural neurological assessment was performed, showing return to baseline, prior to discharge. The patient was provided with post-procedure discharge instructions, including a section on how to identify potential problems. Should any problems arise concerning this procedure, the patient was given instructions to immediately contact us, at any time, without hesitation. In any case, we plan to contact the patient by telephone for a follow-up status report regarding this interventional procedure.  Comments:  No additional relevant information.  Plan of Care  Orders:  No orders of the defined types were placed in this encounter.  Chronic Opioid Analgesic:  Oxycodone IR 10 mg, 1 tab PO q 6 hrs (40 mg/day of oxycodone) MME/day: 60 mg/day.   Medications ordered for procedure: No orders of the defined types were placed in this encounter.  Medications administered: [name] had no medications administered during this visit.  See the medical record for exact dosing, route, and time of administration.  Follow-up plan:   No follow-ups on file.       Interventional Therapies  Risk  Complexity Considerations:   Estimated body mass index is 24.54 kg/m as calculated from the following:   Height as of this encounter: 5\' 10"  (1.778 m).   Weight as of this encounter: 171 lb (77.6 kg).    WNL   Planned  Pending:  Therapeutic bilateral lumbar facet block #2    Under consideration:   Diagnostic bilateral lumbar facet MBB #1  Possible bilateral lumbar facet RFA  Diagnostic bilateral sacroiliac joint block #1  Possible bilateral sacroiliac joint RFA  Possible spinal cord stimulator trial    Completed:   Therapeutic right L2-3 LESI x1 (08/29/2020)  Diagnostic left lumbar facet MBB x1 (03/21/2020)  Diagnostic left SI block x1 (03/21/2020)  Therapeutic/palliative right lateral epicondyle (elbow) injection x2 (01/21/2021)  Therapeutic/palliative left Racz procedure x7 (targeting the left S2, S3 area) (12/18/2021) (0/0/50/90) complete relief of the radicular component.  No benefit to the back pain.   Therapeutic  Palliative (PRN) options:   Palliative right lateral epicondyle (elbow) injection #2  Palliative left Racz procedure #7 (targeting the left S2, S3 area)      Recent Visits Date Type Provider Dept  01/01/22 Office Visit Delano Metz, MD [facility]  12/18/21 Procedure visit Delano Metz, MD [facility]  11/05/21 Office Visit Delano Metz, MD [facility]  Showing recent visits within past 90 days and meeting all other requirements Future Appointments Date Type Provider Dept  01/15/22 Appointment Delano Metz, MD [facility]  02/04/22 Appointment Delano Metz, MD [facility]  Showing future appointments within next 90 days and meeting all other requirements  Disposition: Discharge home  Discharge (Date  Time): 01/15/2022;   hrs.   Primary Care Physician: Wilford Corner, PA-C Location: [facility] Note by: Oswaldo Done, MD Date: 01/15/2022; Time: 4:12 PM  Disclaimer:  Medicine is not an Visual merchandiser. The only guarantee in medicine is that nothing is guaranteed. It is important to note that the decision to proceed with this intervention was based on the information  collected from the patient. The Data and conclusions were drawn from the patient's questionnaire, the interview, and the physical examination. Because the information was provided in large part by the patient, it cannot be guaranteed that it has not been purposely or unconsciously manipulated. Every effort has been made to obtain as much relevant data as possible for this evaluation. It is important to note that the conclusions that lead to this procedure are derived in large part from the available data. Always take into account that the treatment will also be dependent on availability of resources and existing treatment guidelines, considered by other Pain Management Practitioners as being common knowledge and practice, at the time of the intervention. For Medico-Legal purposes, it is also important to point out that variation in procedural techniques and pharmacological choices are the acceptable norm. The indications, contraindications, technique, and results of the above procedure should only be interpreted and judged by a Board-Certified Interventional Pain Specialist with extensive familiarity and expertise in the same exact procedure and technique.

## 2022-01-15 ENCOUNTER — Ambulatory Visit: Payer: BC Managed Care – PPO | Attending: Pain Medicine | Admitting: Pain Medicine

## 2022-01-15 ENCOUNTER — Ambulatory Visit
Admission: RE | Admit: 2022-01-15 | Discharge: 2022-01-15 | Disposition: A | Discharge status: Home / Self Care | Payer: BC Managed Care – PPO | Source: Ambulatory Visit | Attending: Pain Medicine | Admitting: Pain Medicine

## 2022-01-15 ENCOUNTER — Encounter: Payer: Self-pay | Admitting: Pain Medicine

## 2022-01-15 VITALS — BP 129/83 | HR 82 | Temp 98.2°F | Resp 16 | Ht 70.0 in | Wt 176.0 lb

## 2022-01-15 DIAGNOSIS — M961 Postlaminectomy syndrome, not elsewhere classified: Secondary | ICD-10-CM | POA: Insufficient documentation

## 2022-01-15 DIAGNOSIS — G8929 Other chronic pain: Secondary | ICD-10-CM | POA: Diagnosis present

## 2022-01-15 DIAGNOSIS — M47817 Spondylosis without myelopathy or radiculopathy, lumbosacral region: Secondary | ICD-10-CM | POA: Diagnosis present

## 2022-01-15 DIAGNOSIS — M5137 Other intervertebral disc degeneration, lumbosacral region: Secondary | ICD-10-CM | POA: Diagnosis present

## 2022-01-15 DIAGNOSIS — M47816 Spondylosis without myelopathy or radiculopathy, lumbar region: Secondary | ICD-10-CM | POA: Insufficient documentation

## 2022-01-15 DIAGNOSIS — M545 Low back pain, unspecified: Secondary | ICD-10-CM | POA: Insufficient documentation

## 2022-01-15 DIAGNOSIS — M960 Pseudarthrosis after fusion or arthrodesis: Secondary | ICD-10-CM | POA: Insufficient documentation

## 2022-01-15 MED ORDER — TRIAMCINOLONE ACETONIDE 40 MG/ML IJ SUSP
80.0000 mg | Freq: Once | INTRAMUSCULAR | Status: AC
  Administered 2022-01-15: 80 mg

## 2022-01-15 MED ORDER — FENTANYL CITRATE (PF) 100 MCG/2ML IJ SOLN
25.0000 ug | INTRAMUSCULAR | Status: DC | PRN
  Administered 2022-01-15: 100 ug via INTRAVENOUS

## 2022-01-15 MED ORDER — MIDAZOLAM HCL 5 MG/5ML IJ SOLN
INTRAMUSCULAR | Status: AC
  Filled 2022-01-15: qty 5

## 2022-01-15 MED ORDER — MIDAZOLAM HCL 5 MG/5ML IJ SOLN
0.5000 mg | Freq: Once | INTRAMUSCULAR | Status: AC
  Administered 2022-01-15: 3 mg via INTRAVENOUS

## 2022-01-15 MED ORDER — PENTAFLUOROPROP-TETRAFLUOROETH EX AERO: INHALATION_SPRAY | Freq: Once | CUTANEOUS | Status: DC

## 2022-01-15 MED ORDER — LIDOCAINE HCL 2 % IJ SOLN
INTRAMUSCULAR | Status: AC
  Filled 2022-01-15: qty 20

## 2022-01-15 MED ORDER — ROPIVACAINE HCL 2 MG/ML IJ SOLN
18.0000 mL | Freq: Once | INTRAMUSCULAR | Status: AC
  Administered 2022-01-15: 18 mL via PERINEURAL

## 2022-01-15 MED ORDER — LACTATED RINGERS IV SOLN: Freq: Once | INTRAVENOUS | Status: AC

## 2022-01-15 MED ORDER — LIDOCAINE HCL 2 % IJ SOLN
20.0000 mL | Freq: Once | INTRAMUSCULAR | Status: AC
  Administered 2022-01-15: 400 mg

## 2022-01-15 MED ORDER — TRIAMCINOLONE ACETONIDE 40 MG/ML IJ SUSP
INTRAMUSCULAR | Status: AC
  Filled 2022-01-15: qty 1

## 2022-01-15 MED ORDER — ROPIVACAINE HCL 2 MG/ML IJ SOLN
INTRAMUSCULAR | Status: AC
  Filled 2022-01-15: qty 20

## 2022-01-15 MED ORDER — FENTANYL CITRATE (PF) 100 MCG/2ML IJ SOLN
INTRAMUSCULAR | Status: AC
  Filled 2022-01-15: qty 2

## 2022-01-15 NOTE — Progress Notes (Signed)
Safety precautions to be maintained throughout the outpatient stay will include: orient to surroundings, keep bed in low position, maintain call bell within reach at all times, provide assistance with transfer out of bed and ambulation.  

## 2022-01-15 NOTE — Patient Instructions (Signed)

## 2022-01-16 ENCOUNTER — Telehealth: Payer: Self-pay

## 2022-01-16 NOTE — Telephone Encounter (Signed)
Post procedure follow up phone call.  Patient states he is doing well.  ?

## 2022-02-02 ENCOUNTER — Encounter: Payer: Self-pay | Admitting: Pain Medicine

## 2022-02-02 ENCOUNTER — Ambulatory Visit: Payer: BC Managed Care – PPO | Attending: Pain Medicine | Admitting: Pain Medicine

## 2022-02-02 VITALS — BP 123/86 | HR 85 | Temp 97.5°F | Resp 16 | Ht 70.0 in | Wt 180.0 lb

## 2022-02-02 DIAGNOSIS — M5441 Lumbago with sciatica, right side: Secondary | ICD-10-CM

## 2022-02-02 DIAGNOSIS — M5137 Other intervertebral disc degeneration, lumbosacral region: Secondary | ICD-10-CM | POA: Diagnosis not present

## 2022-02-02 DIAGNOSIS — G894 Chronic pain syndrome: Secondary | ICD-10-CM | POA: Diagnosis not present

## 2022-02-02 DIAGNOSIS — G8929 Other chronic pain: Secondary | ICD-10-CM

## 2022-02-02 DIAGNOSIS — Z79891 Long term (current) use of opiate analgesic: Secondary | ICD-10-CM | POA: Diagnosis present

## 2022-02-02 DIAGNOSIS — M5442 Lumbago with sciatica, left side: Secondary | ICD-10-CM | POA: Diagnosis not present

## 2022-02-02 DIAGNOSIS — Z79899 Other long term (current) drug therapy: Secondary | ICD-10-CM

## 2022-02-02 DIAGNOSIS — M79605 Pain in left leg: Secondary | ICD-10-CM | POA: Diagnosis present

## 2022-02-02 DIAGNOSIS — M79604 Pain in right leg: Secondary | ICD-10-CM

## 2022-02-02 DIAGNOSIS — M51379 Other intervertebral disc degeneration, lumbosacral region without mention of lumbar back pain or lower extremity pain: Secondary | ICD-10-CM

## 2022-02-02 DIAGNOSIS — M961 Postlaminectomy syndrome, not elsewhere classified: Secondary | ICD-10-CM | POA: Diagnosis present

## 2022-02-02 MED ORDER — OXYCODONE HCL 10 MG PO TABS: 10.0000 mg | ORAL_TABLET | Freq: Four times a day (QID) | ORAL | 0 refills | Status: DC | PRN

## 2022-02-02 NOTE — Progress Notes (Signed)
Nursing Pain Medication Assessment:  Safety precautions to be maintained throughout the outpatient stay will include: orient to surroundings, keep bed in low position, maintain call bell within reach at all times, provide assistance with transfer out of bed and ambulation.  Medication Inspection Compliance: Pill count conducted under aseptic conditions, in front of the patient. Neither the pills nor the bottle was removed from the patient's sight at any time. Once count was completed pills were immediately returned to the patient in their original bottle.  Medication: Oxycodone IR Pill/Patch Count:  39 of 120 pills remain Pill/Patch Appearance: Markings consistent with prescribed medication Bottle Appearance: Standard pharmacy container. Clearly labeled. Filled Date: 07 / 17 / 2023 Last Medication intake:  Today

## 2022-02-02 NOTE — Progress Notes (Signed)
PROVIDER NOTE: Information contained herein reflects review and annotations entered in association with encounter. Interpretation of such information and data should be left to medically-trained personnel. Information provided to patient can be located elsewhere in the medical record under "Patient Instructions". Document created using STT-dictation technology, any transcriptional errors that may result from process are unintentional.    Patient: Derek [name]  Service Category: E/M  Provider: Gaspar Cola, MD  DOB: Mar 28, 1981  DOS: 02/02/2022  Referring Provider: Jodi Marble, MD  MRN: [id number]  Specialty: Interventional Pain Management  PCP: Donnamarie Rossetti, PA-C  Type: Established Patient  Setting: Ambulatory outpatient    Location: Office  Delivery: Face-to-face     HPI  [name], a 41 y.o. year old male, is here today because of his Chronic pain syndrome [G89.4]. Mr. Allen primary complain today is Back Pain Last encounter: My last encounter with him was on 01/15/2022. Pertinent problems: Mr. Perine has Lumbar pseudoarthrosis (L5-S1); Chronic low back pain (1ry area of Pain) (Bilateral) (L>R) w/ sciatica (Bilateral); Chronic lower extremity pain (2ry area of Pain) (Bilateral) (L>R); Chronic pain syndrome; Failed back surgical syndrome (x3); L5-S1 pseudoarthrosis; Chronic musculoskeletal pain; Spasm of back muscles; Sacroiliac joint dysfunction (Bilateral); Chronic sacroiliac joint pain (Bilateral) (L>R); Somatic dysfunction of sacroiliac joints (Bilateral); Chronic hip pain (Bilateral); Epidural fibrosis; Lumbar postlaminectomy syndrome; DDD (degenerative disc disease), lumbosacral; Lumbar facet syndrome (Bilateral) (L>R); Other specified dorsopathies, sacral and sacrococcygeal region; Spondylosis without myelopathy or radiculopathy, lumbosacral region; Neurogenic pain; Osteoarthritic spondylosis of lumbar spine; Tendinitis of elbows (Bilateral); Lateral epicondylitis  of elbows (Bilateral); Chronic elbow pain (Right); Chronic low back pain (Left) w/o sciatica; Numbness of anterior thigh (Right); Burning pain in thigh (Right); Chronic low back pain (Midline) w/o sciatica; Acute exacerbation of chronic low back pain; Chronic elbow pain (Bilateral); Radial collateral ligament sprain of elbow, unspecified laterality, sequela (Bilateral); Enthesopathy of elbow region (Bilateral); Sprain of lateral collateral ligament of elbow, sequela (Left); Sprain of lateral collateral ligament of elbow, sequela (Right); Radicular pain of lumbosacral region; Lumbosacral radiculopathy; and Chronic low back pain (Bilateral) w/o sciatica on their pertinent problem list. Pain Assessment: Severity of Chronic pain is reported as a 4 /10. Location: Back Lower/denies today. not going down leg. Onset: More than a month ago. Quality: Aching, Sharp, Stabbing. Timing: Constant. Modifying factor(s): rest, heat, streches. Vitals:  height is _0  (1.778 m) and weight is 180 lb (81.6 kg). His temperature is 97.5 F (36.4 C) (abnormal). His blood pressure is 123/86 and his pulse is 85. His respiration is 16 and oxygen saturation is 100%.   Reason for encounter: both, medication management and post-procedure evaluation and assessment.  The patient indicates doing well with the current medication regimen. No adverse reactions or side effects reported to the medications.   UDS ordered today.   RTCB: 05/16/2022 Nonopioids transferred 05/02/2020: Zanaflex, Flexeril, and Mobic  Post-procedure evaluation   Type: Lumbar Facet, Medial Branch Block(s) #2  Laterality: Bilateral  Level: L2, L3, L4, L5, & S1 Medial Branch Level(s). Injecting these levels blocks the L3-4, L4-5 and L5-S1 lumbar facet joints. Imaging: Fluoroscopic guidance Anesthesia: Local anesthesia (1-2% Lidocaine) Anxiolysis: IV Versed         Sedation: Moderate conscious sedation. DOS: 01/15/2022 Performed by: Gaspar Cola,  MD  Primary Purpose: Diagnostic/Therapeutic Indications: Low back pain severe enough to impact quality of life or function. 1. Lumbar facet syndrome (Bilateral) (L>R)   2. DDD (degenerative disc disease), lumbosacral   3. Spondylosis  without myelopathy or radiculopathy, lumbosacral region   4. Chronic low back pain (Bilateral) w/o sciatica    NAS-11 Pain score:   Pre-procedure: 6 /10   Post-procedure: 0-No pain/10      Effectiveness:  Initial hour after procedure: 100 %. Subsequent 4-6 hours post-procedure: 100 %. Analgesia past initial 6 hours: 75 % (current). Ongoing improvement:  Analgesic: The patient indicates having an ongoing 75% improvement of his low back pain. Function: Mr. Saville reports improvement in function ROM: Mr. Saunders reports improvement in ROM  Pharmacotherapy Assessment  Analgesic: Oxycodone IR 10 mg, 1 tab PO q 6 hrs (40 mg/day of oxycodone) MME/day: 60 mg/day.   Monitoring: [place] PMP: PDMP reviewed during this encounter.       Pharmacotherapy: No side-effects or adverse reactions reported. Compliance: No problems identified. Effectiveness: Clinically acceptable.  Ignatius Specking, RN  02/02/2022  8:18 AM  Sign when Signing Visit Nursing Pain Medication Assessment:  Safety precautions to be maintained throughout the outpatient stay will include: orient to surroundings, keep bed in low position, maintain call bell within reach at all times, provide assistance with transfer out of bed and ambulation.  Medication Inspection Compliance: Pill count conducted under aseptic conditions, in front of the patient. Neither the pills nor the bottle was removed from the patient's sight at any time. Once count was completed pills were immediately returned to the patient in their original bottle.  Medication: Oxycodone IR Pill/Patch Count:  39 of 120 pills remain Pill/Patch Appearance: Markings consistent with prescribed medication Bottle Appearance: Standard pharmacy  container. Clearly labeled. Filled Date: 07 / 17 / 2023 Last Medication intake:  Today    UDS:  Summary  Date Value Ref Range Status  02/19/2021 Note  Final    Comment:    ==================================================================== [employer] Select 13 (MW) ==================================================================== Test                             Result       Flag       Units  Drug Present and Declared for Prescription Verification   Oxycodone                      729          EXPECTED   ng/mg creat   Oxymorphone                    3202         EXPECTED   ng/mg creat   Noroxycodone                   1253         EXPECTED   ng/mg creat   Noroxymorphone                 627          EXPECTED   ng/mg creat    Sources of oxycodone are scheduled prescription medications.    Oxymorphone, noroxycodone, and noroxymorphone are expected    metabolites of oxycodone. Oxymorphone is also available as a    scheduled prescription medication.  ==================================================================== Test                      Result    Flag   Units      Ref Range   Creatinine  45               mg/dL      >=20 ==================================================================== Declared Medications:  The flagging and interpretation on this report are based on the  following declared medications.  Unexpected results may arise from  inaccuracies in the declared medications.   **Note: The testing scope of this panel includes these medications:   Oxycodone   **Note: The testing scope of this panel does not include the  following reported medications:   Calcium  Cyclobenzaprine (Flexeril)  Meloxicam (Mobic)  Tizanidine (Zanaflex)  Topical Diclofenac  Vitamin D ==================================================================== For clinical consultation, please call [phone]. ====================================================================       ROS  Constitutional: Denies any fever or chills Gastrointestinal: No reported hemesis, hematochezia, vomiting, or acute GI distress Musculoskeletal: Denies any acute onset joint swelling, redness, loss of ROM, or weakness Neurological: No reported episodes of acute onset apraxia, aphasia, dysarthria, agnosia, amnesia, paralysis, loss of coordination, or loss of consciousness  Medication Review  Calcium Carb-Cholecalciferol, Diclofenac Sodium, Oxycodone HCl, cyclobenzaprine, esomeprazole, meloxicam, and tiZANidine  History Review  Allergy: Mr. Guglielmo is allergic to hydrocodone, amoxicillin, and vicodin [hydrocodone-acetaminophen]. Drug: Mr. Schlink  reports no history of drug use. Alcohol:  reports current alcohol use. Tobacco:  reports that he has been smoking cigarettes. He has a 16.00 pack-year smoking history. He has never used smokeless tobacco. Social: Mr. Hallowell  reports that he has been smoking cigarettes. He has a 16.00 pack-year smoking history. He has never used smokeless tobacco. He reports current alcohol use. He reports that he does not use drugs. Medical:  has a past medical history of Anxiety, Arthritis, and GERD (gastroesophageal reflux disease). Surgical: Mr. Busk  has a past surgical history that includes Back surgery; HAND REIMPLANTED; and Lumbar fusion (11/14). Family: family history is not on file.  Laboratory Chemistry Profile   Renal Lab Results  Component Value Date   BUN 11 01/16/2019   CREATININE 1.08 01/16/2019   BCR 10 01/16/2019   GFRAA 99 01/16/2019   GFRNONAA 86 01/16/2019    Hepatic Lab Results  Component Value Date   AST 24 01/16/2019   ALBUMIN 4.6 01/16/2019   ALKPHOS 108 01/16/2019    Electrolytes Lab Results  Component Value Date   NA 143 01/16/2019   K 4.0 01/16/2019   CL 105 01/16/2019   CALCIUM 10.2 01/16/2019   MG 2.0 01/16/2019    Bone Lab Results  Component Value Date   25OHVITD1 39 01/16/2019   25OHVITD2 1.2 01/16/2019    25OHVITD3 38 01/16/2019    Inflammation (CRP: Acute Phase) (ESR: Chronic Phase) Lab Results  Component Value Date   CRP 4 01/16/2019   ESRSEDRATE 29 (H) 01/16/2019         Note: Above Lab results reviewed.  Recent Imaging Review  DG PAIN CLINIC C-ARM 1-60 MIN NO REPORT Fluoro was used, but no Radiologist interpretation will be provided.  Please refer to "NOTES" tab for provider progress note. Note: Reviewed        Physical Exam  General appearance: Well nourished, well developed, and well hydrated. In no apparent acute distress Mental status: Alert, oriented x 3 (person, place, & time)       Respiratory: No evidence of acute respiratory distress Eyes: PERLA Vitals: BP 123/86   Pulse 85   Temp (!) 97.5 F (36.4 C)   Resp 16   Ht _0  (1.778 m)   Wt 180 lb (81.6 kg)   SpO2 100%  BMI 25.83 kg/m  BMI: Estimated body mass index is 25.83 kg/m as calculated from the following:   Height as of this encounter: _0  (1.778 m).   Weight as of this encounter: 180 lb (81.6 kg). Ideal: Ideal body weight: 73 kg (160 lb 15 oz) Adjusted ideal body weight: 76.5 kg (168 lb 9 oz)  Assessment   Diagnosis Status  1. Chronic pain syndrome   2. Chronic low back pain (1ry area of Pain) (Bilateral) (L>R) w/ sciatica (Bilateral)   3. Chronic lower extremity pain (2ry area of Pain) (Bilateral) (L>R)   4. DDD (degenerative disc disease), lumbosacral   5. Failed back surgical syndrome (x3)   6. Pharmacologic therapy   7. Chronic use of opiate for therapeutic purpose   8. Encounter for medication management   9. Encounter for chronic pain management    Controlled Controlled Controlled   Updated Problems: No problems updated.  Plan of Care  Problem-specific:  No problem-specific Assessment & Plan notes found for this encounter.  Mr. PLACIDO HANGARTNER has a current medication list which includes the following long-term medication(s): calcium carb-cholecalciferol, esomeprazole, [START  ON 02/15/2022] oxycodone hcl, [START ON 03/17/2022] oxycodone hcl, [START ON 04/16/2022] oxycodone hcl, meloxicam, and tizanidine.  Pharmacotherapy (Medications Ordered): Meds ordered this encounter  Medications   Oxycodone HCl 10 MG TABS    Sig: Take 1 tablet (10 mg total) by mouth every 6 (six) hours as needed. Must last 30 days    Dispense:  120 tablet    Refill:  0    DO NOT: delete (not duplicate); no partial-fill (will deny script to complete), no refill request (F/U required). DISPENSE: 1 day early if closed on fill date. WARN: No CNS-depressants within 8 hrs of med.   Oxycodone HCl 10 MG TABS    Sig: Take 1 tablet (10 mg total) by mouth every 6 (six) hours as needed. Must last 30 days    Dispense:  120 tablet    Refill:  0    DO NOT: delete (not duplicate); no partial-fill (will deny script to complete), no refill request (F/U required). DISPENSE: 1 day early if closed on fill date. WARN: No CNS-depressants within 8 hrs of med.   Oxycodone HCl 10 MG TABS    Sig: Take 1 tablet (10 mg total) by mouth every 6 (six) hours as needed. Must last 30 days    Dispense:  120 tablet    Refill:  0    DO NOT: delete (not duplicate); no partial-fill (will deny script to complete), no refill request (F/U required). DISPENSE: 1 day early if closed on fill date. WARN: No CNS-depressants within 8 hrs of med.   Orders:  Orders Placed This Encounter  Procedures   [employer] Select 13 (MW), Urine    Volume: 30 ml(s). Minimum 3 ml of urine is needed. Document temperature of fresh sample. Indications: Long term (current) use of opiate analgesic (U13.244)    Order Specific Question:   Release to patient    Answer:   Immediate   Follow-up plan:   Return in about 3 months (around 05/16/2022) for Eval-day (M,W), (F2F), (MM).     Interventional Therapies  Risk  Complexity Considerations:   Estimated body mass index is 24.54 kg/m as calculated from the following:   Height as of this encounter: _1   (1.778 m).   Weight as of this encounter: 171 lb (77.6 kg).    WNL   Planned  Pending:  Under consideration:   Possible bilateral lumbar facet RFA  Diagnostic bilateral SI Blk #1  Possible bilateral SI RFA  Possible spinal cord stimulator trial    Completed:   Therapeutic right L2-3 LESI x1 (08/29/2020)  Diagnostic left lumbar facet MBB x2 (01/15/2022) (100/100/75/75)  Diagnostic right lumbar facet MBB x1 (01/15/2022) (100/100/75/75)  Diagnostic left SI block x1 (03/21/2020)  Therapeutic/palliative right lateral epicondyle (elbow) injection x2 (01/21/2021)  Therapeutic/palliative left Racz procedure x7 (targeting the left S2, S3 area) (12/18/2021) (0/0/50/90) complete relief of the radicular component.  No benefit to the back pain.   Therapeutic  Palliative (PRN) options:   Palliative right lateral epicondyle (elbow) injection #2  Palliative left Racz procedure #7 (targeting the left S2, S3 area)       Recent Visits Date Type Provider Dept  01/15/22 Procedure visit Milinda Pointer, MD [facility]  01/01/22 Office Visit Milinda Pointer, MD [facility]  12/18/21 Procedure visit Milinda Pointer, MD [facility]  11/05/21 Office Visit Milinda Pointer, MD [facility]  Showing recent visits within past 90 days and meeting all other requirements Today's Visits Date Type Provider Dept  02/02/22 Office Visit Milinda Pointer, MD [facility]  Showing today's visits and meeting all other requirements Future Appointments No visits were found meeting these conditions. Showing future appointments within next 90 days and meeting all other requirements  I discussed the assessment and treatment plan with the patient. The patient was provided an opportunity to ask questions and all were answered. The patient agreed with the plan and demonstrated an understanding of the instructions.  Patient advised to call back or seek an  in-person evaluation if the symptoms or condition worsens.  Duration of encounter: 30 minutes.  Total time on encounter, as per AMA guidelines included both the face-to-face and non-face-to-face time personally spent by the physician and/or other qualified health care professional(s) on the day of the encounter (includes time in activities that require the physician or other qualified health care professional and does not include time in activities normally performed by clinical staff). Physician's time may include the following activities when performed: preparing to see the patient (eg, review of tests, pre-charting review of records) obtaining and/or reviewing separately obtained history performing a medically appropriate examination and/or evaluation counseling and educating the patient/family/caregiver ordering medications, tests, or procedures referring and communicating with other health care professionals (when not separately reported) documenting clinical information in the electronic or other health record independently interpreting results (not separately reported) and communicating results to the patient/ family/caregiver care coordination (not separately reported)  Note by: Gaspar Cola, MD Date: 02/02/2022; Time: 8:46 AM

## 2022-02-03 ENCOUNTER — Telehealth: Payer: BC Managed Care – PPO | Admitting: Pain Medicine

## 2022-02-04 ENCOUNTER — Encounter: Payer: BC Managed Care – PPO | Admitting: Pain Medicine

## 2022-02-06 LAB — TOXASSURE SELECT 13 (MW), URINE

## 2022-05-05 NOTE — Progress Notes (Unsigned)
PROVIDER NOTE: Information contained herein reflects review and annotations entered in association with encounter. Interpretation of such information and data should be left to medically-trained personnel. Information provided to patient can be located elsewhere in the medical record under "Patient Instructions". Document created using STT-dictation technology, any transcriptional errors that may result from process are unintentional.    Patient: Derek [name]  Service Category: E/M  Provider: Gaspar Cola, MD  DOB: [birth year]  DOS: 05/06/2022  Referring Provider: Donnamarie Rossetti  MRN: [id number]  Specialty: Interventional Pain Management  PCP: Donnamarie Rossetti, PA-C  Type: Established Patient  Setting: Ambulatory outpatient    Location: Office  Delivery: Face-to-face     HPI  [name], a 41 y.o. year old male, is here today because of his Chronic pain syndrome [G89.4]. Mr. Tabb primary complain today is No chief complaint on file. Last encounter: My last encounter with him was on 02/02/2022. Pertinent problems: Mr. Iiams has Lumbar pseudoarthrosis (L5-S1); Chronic low back pain (1ry area of Pain) (Bilateral) (L>R) w/ sciatica (Bilateral); Chronic lower extremity pain (2ry area of Pain) (Bilateral) (L>R); Chronic pain syndrome; Failed back surgical syndrome (x3); L5-S1 pseudoarthrosis; Chronic musculoskeletal pain; Spasm of back muscles; Sacroiliac joint dysfunction (Bilateral); Chronic sacroiliac joint pain (Bilateral) (L>R); Somatic dysfunction of sacroiliac joints (Bilateral); Chronic hip pain (Bilateral); Epidural fibrosis; Lumbar postlaminectomy syndrome; DDD (degenerative disc disease), lumbosacral; Lumbar facet syndrome (Bilateral) (L>R); Other specified dorsopathies, sacral and sacrococcygeal region; Spondylosis without myelopathy or radiculopathy, lumbosacral region; Neurogenic pain; Osteoarthritic spondylosis of lumbar spine; Tendinitis of elbows (Bilateral);  Lateral epicondylitis of elbows (Bilateral); Chronic elbow pain (Right); Chronic low back pain (Left) w/o sciatica; Numbness of anterior thigh (Right); Burning pain in thigh (Right); Chronic low back pain (Midline) w/o sciatica; Acute exacerbation of chronic low back pain; Chronic elbow pain (Bilateral); Radial collateral ligament sprain of elbow, unspecified laterality, sequela (Bilateral); Enthesopathy of elbow region (Bilateral); Sprain of lateral collateral ligament of elbow, sequela (Left); Sprain of lateral collateral ligament of elbow, sequela (Right); Radicular pain of lumbosacral region; Lumbosacral radiculopathy; and Chronic low back pain (Bilateral) w/o sciatica on their pertinent problem list. Pain Assessment: Severity of   is reported as a  /10. Location:    / . Onset:  . Quality:  . Timing:  . Modifying factor(s):  Marland Kitchen Vitals:  vitals were not taken for this visit.   Reason for encounter: medication management. ***  RTCB: 08/14/2022 Nonopioids transferred 05/02/2020: Zanaflex, Flexeril, and Mobic  Pharmacotherapy Assessment  Analgesic: Oxycodone IR 10 mg, 1 tab PO q 6 hrs (40 mg/day of oxycodone) MME/day: 60 mg/day.   Monitoring: [place] PMP: PDMP reviewed during this encounter.       Pharmacotherapy: No side-effects or adverse reactions reported. Compliance: No problems identified. Effectiveness: Clinically acceptable.  No notes on file  No results found for: "CBDTHCR" No results found for: "D8THCCBX" No results found for: "D9THCCBX"  UDS:  Summary  Date Value Ref Range Status  02/02/2022 Note  Final    Comment:    ==================================================================== [facility] (MW) ==================================================================== Test                             Result       Flag       Units  Drug Present and Declared for Prescription Verification   Oxycodone  2636         EXPECTED   ng/mg creat   Oxymorphone                     5162         EXPECTED   ng/mg creat   Noroxycodone                   3235         EXPECTED   ng/mg creat   Noroxymorphone                 1681         EXPECTED   ng/mg creat    Sources of oxycodone are scheduled prescription medications.    Oxymorphone, noroxycodone, and noroxymorphone are expected    metabolites of oxycodone. Oxymorphone is also available as a    scheduled prescription medication.  ==================================================================== Test                      Result    Flag   Units      Ref Range   Creatinine              165              mg/dL      >=20 ==================================================================== Declared Medications:  The flagging and interpretation on this report are based on the  following declared medications.  Unexpected results may arise from  inaccuracies in the declared medications.   **Note: The testing scope of this panel includes these medications:   Oxycodone   **Note: The testing scope of this panel does not include the  following reported medications:   Calcium  Cholecalciferol  Cyclobenzaprine  Diclofenac  Esomeprazole (Nexium)  Meloxicam (Mobic)  Tizanidine (Zanaflex) ==================================================================== For clinical consultation, please call [phone]. ====================================================================       ROS  Constitutional: Denies any fever or chills Gastrointestinal: No reported hemesis, hematochezia, vomiting, or acute GI distress Musculoskeletal: Denies any acute onset joint swelling, redness, loss of ROM, or weakness Neurological: No reported episodes of acute onset apraxia, aphasia, dysarthria, agnosia, amnesia, paralysis, loss of coordination, or loss of consciousness  Medication Review  Calcium Carb-Cholecalciferol, Diclofenac Sodium, Oxycodone HCl, cyclobenzaprine, esomeprazole, meloxicam, and  tiZANidine  History Review  Allergy: Mr. Goodin is allergic to hydrocodone, amoxicillin, and vicodin [hydrocodone-acetaminophen]. Drug: Mr. Cudworth  reports no history of drug use. Alcohol:  reports current alcohol use. Tobacco:  reports that he has been smoking cigarettes. He has a 16.00 pack-year smoking history. He has never used smokeless tobacco. Social: Mr. Want  reports that he has been smoking cigarettes. He has a 16.00 pack-year smoking history. He has never used smokeless tobacco. He reports current alcohol use. He reports that he does not use drugs. Medical:  has a past medical history of Anxiety, Arthritis, and GERD (gastroesophageal reflux disease). Surgical: Mr. Brill  has a past surgical history that includes Back surgery; HAND REIMPLANTED; and Lumbar fusion (11/14). Family: family history is not on file.  Laboratory Chemistry Profile   Renal Lab Results  Component Value Date   BUN 11 01/16/2019   CREATININE 1.08 01/16/2019   BCR 10 01/16/2019   GFRAA 99 01/16/2019   GFRNONAA 86 01/16/2019    Hepatic Lab Results  Component Value Date   AST 24 01/16/2019   ALBUMIN 4.6 01/16/2019   ALKPHOS 108 01/16/2019    Electrolytes Lab Results  Component Value Date  NA 143 01/16/2019   K 4.0 01/16/2019   CL 105 01/16/2019   CALCIUM 10.2 01/16/2019   MG 2.0 01/16/2019    Bone Lab Results  Component Value Date   25OHVITD1 39 01/16/2019   25OHVITD2 1.2 01/16/2019   25OHVITD3 38 01/16/2019    Inflammation (CRP: Acute Phase) (ESR: Chronic Phase) Lab Results  Component Value Date   CRP 4 01/16/2019   ESRSEDRATE 29 (H) 01/16/2019         Note: Above Lab results reviewed.  Recent Imaging Review  DG PAIN CLINIC C-ARM 1-60 MIN NO REPORT Fluoro was used, but no Radiologist interpretation will be provided.  Please refer to "NOTES" tab for provider progress note. Note: Reviewed        Physical Exam  General appearance: Well nourished, well developed, and well hydrated.  In no apparent acute distress Mental status: Alert, oriented x 3 (person, place, & time)       Respiratory: No evidence of acute respiratory distress Eyes: PERLA Vitals: There were no vitals taken for this visit. BMI: Estimated body mass index is 25.83 kg/m as calculated from the following:   Height as of 02/02/22: _0  (1.778 m).   Weight as of 02/02/22: 180 lb (81.6 kg). Ideal: Patient weight not recorded  Assessment   Diagnosis Status  1. Chronic pain syndrome   2. Failed back surgical syndrome (x3)   3. 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 Chronic lower extremity pain (2ry area of Pain) (Bilateral) (L>R)   5. DDD (degenerative disc disease), lumbosacral   6. Pharmacologic therapy   7. Chronic use of opiate for therapeutic purpose   8. Encounter for medication management   9. Encounter for chronic pain management    Controlled Controlled Controlled   Updated Problems: No problems updated.   Plan of Care  Problem-specific:  No problem-specific Assessment & Plan notes found for this encounter.  Mr. DERICO MITTON has a current medication list which includes the following long-term medication(s): calcium carb-cholecalciferol, esomeprazole, meloxicam, oxycodone hcl, and tizanidine.  Pharmacotherapy (Medications Ordered): No orders of the defined types were placed in this encounter.  Orders:  No orders of the defined types were placed in this encounter.  Follow-up plan:   No follow-ups on file.     Interventional Therapies  Risk  Complexity Considerations:   Estimated body mass index is 24.54 kg/m as calculated from the following:   Height as of this encounter: _1  (1.778 m).   Weight as of this encounter: 171 lb (77.6 kg).    WNL   Planned  Pending:      Under consideration:   Possible bilateral lumbar facet RFA  Diagnostic bilateral SI Blk #1  Possible bilateral SI RFA  Possible spinal cord  stimulator trial    Completed:   Therapeutic right L2-3 LESI x1 (08/29/2020)  Diagnostic left lumbar facet MBB x2 (01/15/2022) (100/100/75/75)  Diagnostic right lumbar facet MBB x1 (01/15/2022) (100/100/75/75)  Diagnostic left SI block x1 (03/21/2020)  Therapeutic/palliative right lateral epicondyle (elbow) injection x2 (01/21/2021)  Therapeutic/palliative left Racz procedure x7 (targeting the left S2, S3 area) (12/18/2021) (0/0/50/90) complete relief of the radicular component.  No benefit to the back pain.   Therapeutic  Palliative (PRN) options:   Palliative right lateral epicondyle (elbow) injection #2  Palliative left Racz procedure #7 (targeting the left S2, S3 area)        Recent Visits No visits were  found meeting these conditions. Showing recent visits within past 90 days and meeting all other requirements Future Appointments Date Type Provider Dept  05/06/22 Appointment Milinda Pointer, MD [facility]  Showing future appointments within next 90 days and meeting all other requirements  I discussed the assessment and treatment plan with the patient. The patient was provided an opportunity to ask questions and all were answered. The patient agreed with the plan and demonstrated an understanding of the instructions.  Patient advised to call back or seek an in-person evaluation if the symptoms or condition worsens.  Duration of encounter: *** minutes.  Total time on encounter, as per AMA guidelines included both the face-to-face and non-face-to-face time personally spent by the physician and/or other qualified health care professional(s) on the day of the encounter (includes time in activities that require the physician or other qualified health care professional and does not include time in activities normally performed by clinical staff). Physician's time may include the following activities when performed: preparing to see the patient (eg, review of tests, pre-charting  review of records) obtaining and/or reviewing separately obtained history performing a medically appropriate examination and/or evaluation counseling and educating the patient/family/caregiver ordering medications, tests, or procedures referring and communicating with other health care professionals (when not separately reported) documenting clinical information in the electronic or other health record independently interpreting results (not separately reported) and communicating results to the patient/ family/caregiver care coordination (not separately reported)  Note by: Gaspar Cola, MD Date: 05/06/2022; Time: 8:10 AM

## 2022-05-05 NOTE — Patient Instructions (Signed)
____________________________________________________________________________________________  Patient Information update  To: All of our patients.  Re: Name change.  It has been made official that our current name, "[facility]"   will soon be changed to "[facility]".   The purpose of this change is to eliminate any confusion created by the concept of our practice being a "Medication Management Pain Clinic". In the past this has led to the misconception that we treat pain primarily by the use of prescription medications.  Nothing can be farther from the truth.   Understanding PAIN MANAGEMENT: To further understand what our practice does, you first have to understand that "Pain Management" is a subspecialty that requires additional training once a physician has completed their specialty training, which can be in either Anesthesia, Neurology, Psychiatry, or Physical Medicine and Rehabilitation (PMR). Each one of these contributes to the final approach taken by each physician to the management of their patient's pain. To be a "Pain Management Specialist" you must have first completed one of the specialty trainings below.  Anesthesiologists - trained in clinical pharmacology and interventional techniques such as nerve blockade and regional as well as central neuroanatomy. They are trained to block pain before, during, and after surgical interventions.  Neurologists - trained in the diagnosis and pharmacological treatment of complex neurological conditions, such as Multiple Sclerosis, Parkinson's, spinal cord injuries, and other systemic conditions that may be associated with symptoms that may include but are not limited to pain. They tend to rely primarily on the treatment of chronic pain using prescription medications.  Psychiatrist - trained in conditions affecting the psychosocial wellbeing  of patients including but not limited to depression, anxiety, schizophrenia, personality disorders, addiction, and other substance use disorders that may be associated with chronic pain. They tend to rely primarily on the treatment of chronic pain using prescription medications.   Physical Medicine and Rehabilitation (PMR) physicians, also known as physiatrists - trained to treat a wide variety of medical conditions affecting the brain, spinal cord, nerves, bones, joints, ligaments, muscles, and tendons. Their training is primarily aimed at treating patients that have suffered injuries that have caused severe physical impairment. Their training is primarily aimed at the physical therapy and rehabilitation of those patients. They may also work alongside orthopedic surgeons or neurosurgeons using their expertise in assisting surgical patients to recover after their surgeries.  INTERVENTIONAL PAIN MANAGEMENT is sub-subspecialty of Pain Management.  Our physicians are Board-certified in Anesthesia, Pain Management, and Interventional Pain Management.  This meaning that not only have they been trained and Board-certified in their specialty of Anesthesia, and subspecialty of Pain Management, but they have also received further training in the sub-subspecialty of Interventional Pain Management, in order to become Board-certified as INTERVENTIONAL PAIN MANAGEMENT SPECIALIST.    Mission: Our goal is to use our skills in  INTERVENTIONAL PAIN MANAGEMENT as alternatives to the chronic use of prescription opioid medications for the treatment of pain. To make this more clear, we have changed our name to reflect what we do and offer. We will continue to offer medication management assessment and recommendations, but we will not be taking over any patient's medication management.  ____________________________________________________________________________________________   _______________________________________________________________________  Medication Rules  Purpose: To inform patients, and their family members, of our medication rules and regulations.  Applies to: All patients receiving prescriptions from our practice (written or electronic).  Pharmacy of record: This is the pharmacy where your electronic prescriptions will be sent.   Make sure we have the correct one.  Electronic prescriptions: In compliance with the [place] Strengthen Opioid Misuse Prevention (STOP) Act of 2017 (Session Law 2017-74/H243), effective July 06, 2018, all controlled substances must be electronically prescribed. Written prescriptions, faxing, or calling prescriptions to a pharmacy will no longer be done.  Prescription refills: These will be provided only during in-person appointments. No medications will be renewed without a "face-to-face" evaluation with your provider. Applies to all prescriptions.  NOTE: The following applies primarily to controlled substances (Opioid* Pain Medications).   Type of encounter (visit): For patients receiving controlled substances, face-to-face visits are required. (Not an option and not up to the patient.)  Patient's responsibilities: Pain Pills: Bring all pain pills to every appointment (except for procedure appointments). Pill Bottles: Bring pills in original pharmacy bottle. Bring bottle, even if empty. Always bring the bottle of the most recent fill.  Medication refills: You are responsible for knowing and keeping track of what medications you are taking and when is it that you will need a refill. The day before your appointment: write a list of all prescriptions that need to be refilled. The day of the appointment: give the list to the admitting nurse. Prescriptions will be written only during appointments. No prescriptions will be written on procedure days. If you forget a medication: it will not be "Called in", "Faxed", or  "electronically sent". You will need to get another appointment to get these prescribed. No early refills. Do not call asking to have your prescription filled early. Partial  or short prescriptions: Occasionally your pharmacy may not have enough pills to fill your prescription.  NEVER ACCEPT a partial fill or a prescription that is short of the total amount of pills that you were prescribed.  With controlled substances the law allows 72 hours for the pharmacy to complete the prescription.  If the prescription is not completed within 72 hours, the pharmacist will require a new prescription to be written. This means that you will be short on your medicine and we WILL NOT send another prescription to complete your original prescription.  Instead, request the pharmacy to send a carrier to a nearby branch to get enough medication to provide you with your full prescription. Prescription Accuracy: You are responsible for carefully inspecting your prescriptions before leaving our office. Have the discharge nurse carefully go over each prescription with you, before taking them home. Make sure that your name is accurately spelled, that your address is correct. Check the name and dose of your medication to make sure it is accurate. Check the number of pills, and the written instructions to make sure they are clear and accurate. Make sure that you are given enough medication to last until your next medication refill appointment. Taking Medication: Take medication as prescribed. When it comes to controlled substances, taking less pills or less frequently than prescribed is permitted and encouraged. Never take more pills than instructed. Never take the medication more frequently than prescribed.  Inform other Doctors: Always inform, all of your healthcare providers, of all the medications you take. Pain Medication from other Providers: You are not allowed to accept any additional pain medication from any other Doctor or  Healthcare provider. There are two exceptions to this rule. (see below) In the event that you require additional pain medication, you are responsible for notifying us, as stated below. Cough Medicine: Often these contain an opioid, such as codeine or hydrocodone. Never accept or take cough medicine containing these opioids if   you are already taking an opioid* medication. The combination may cause respiratory failure and death. Medication Agreement: You are responsible for carefully reading and following our Medication Agreement. This must be signed before receiving any prescriptions from our practice. Safely store a copy of your signed Agreement. Violations to the Agreement will result in no further prescriptions. (Additional copies of our Medication Agreement are available upon request.) Laws, Rules, & Regulations: All patients are expected to follow all Federal and State Laws, Statutes, Rules, & Regulations. Ignorance of the Laws does not constitute a valid excuse.  Illegal drugs and Controlled Substances: The use of illegal substances (including, but not limited to marijuana and its derivatives) and/or the illegal use of any controlled substances is strictly prohibited. Violation of this rule may result in the immediate and permanent discontinuation of any and all prescriptions being written by our practice. The use of any illegal substances is prohibited. Adopted CDC guidelines & recommendations: Target dosing levels will be at or below 60 MME/day. Use of benzodiazepines** is not recommended.  Exceptions: There are only two exceptions to the rule of not receiving pain medications from other Healthcare Providers. Exception #1 (Emergencies): In the event of an emergency (i.e.: accident requiring emergency care), you are allowed to receive additional pain medication. However, you are responsible for: As soon as you are able, call our office [phone], at any time of the day or night, and leave a  message stating your name, the date and nature of the emergency, and the name and dose of the medication prescribed. In the event that your call is answered by a member of our staff, make sure to document and save the date, time, and the name of the person that took your information.  Exception #2 (Planned Surgery): In the event that you are scheduled by another doctor or dentist to have any type of surgery or procedure, you are allowed (for a period no longer than 30 days), to receive additional pain medication, for the acute post-op pain. However, in this case, you are responsible for picking up a copy of our "Post-op Pain Management for Surgeons" handout, and giving it to your surgeon or dentist. This document is available at our office, and does not require an appointment to obtain it. Simply go to our office during business hours (Monday-Thursday from 8:00 AM to 4:00 PM) (Friday 8:00 AM to 12:00 Noon) or if you have a scheduled appointment with us, prior to your surgery, and ask for it by name. In addition, you are responsible for: calling our office [phone], at any time of the day or night, and leaving a message stating your name, name of your surgeon, type of surgery, and date of procedure or surgery. Failure to comply with your responsibilities may result in termination of therapy involving the controlled substances. Medication Agreement Violation. Following the above rules, including your responsibilities will help you in avoiding a Medication Agreement Violation ("Breaking your Pain Medication Contract").  Consequences:  Not following the above rules may result in permanent discontinuation of medication prescription therapy.  *Opioid medications include: morphine, codeine, oxycodone, oxymorphone, hydrocodone, hydromorphone, meperidine, tramadol, tapentadol, buprenorphine, fentanyl, methadone. **Benzodiazepine medications include: diazepam (Valium), alprazolam (Xanax), clonazepam (Klonopine),  lorazepam (Ativan), clorazepate (Tranxene), chlordiazepoxide (Librium), estazolam (Prosom), oxazepam (Serax), temazepam (Restoril), triazolam (Halcion) (Last updated: 04/28/2022) ______________________________________________________________________   ______________________________________________________________________  Medication Recommendations and Reminders  Applies to: All patients receiving prescriptions (written and/or electronic).  Medication Rules & Regulations: You are responsible for reading, knowing, and   following our "Medication Rules" document. These exist for your safety and that of others. They are not flexible and neither are we. Dismissing or ignoring them is an act of "non-compliance" that may result in complete and irreversible termination of such medication therapy. For safety reasons, "non-compliance" will not be tolerated. As with the [place] fundamental legal principle of "ignorance of the law is no defense", we will accept no excuses for not having read and knowing the content of documents provided to you by our practice.  Pharmacy of record:  Definition: This is the pharmacy where your electronic prescriptions will be sent.  We do not endorse any particular pharmacy. It is up to you and your insurance to decide what pharmacy to use.  We do not restrict you in your choice of pharmacy. However, once we write for your prescriptions, we will NOT be re-sending more prescriptions to fix restricted supply problems created by your pharmacy, or your insurance.  The pharmacy listed in the electronic medical record should be the one where you want electronic prescriptions to be sent. If you choose to change pharmacy, simply notify our nursing staff. Changes will be made only during your regular appointments and not over the phone.  Recommendations: Keep all of your pain medications in a safe place, under lock and key, even if you live alone. We will NOT replace lost, stolen, or  damaged medication. We do not accept "Police Reports" as proof of medications having been stolen. After you fill your prescription, take 1 week's worth of pills and put them away in a safe place. You should keep a separate, properly labeled bottle for this purpose. The remainder should be kept in the original bottle. Use this as your primary supply, until it runs out. Once it's gone, then you know that you have 1 week's worth of medicine, and it is time to come in for a prescription refill. If you do this correctly, it is unlikely that you will ever run out of medicine. To make sure that the above recommendation works, it is very important that you make sure your medication refill appointments are scheduled at least 1 week before you run out of medicine. To do this in an effective manner, make sure that you do not leave the office without scheduling your next medication management appointment. Always ask the nursing staff to show you in your prescription , when your medication will be running out. Then arrange for the receptionist to get you a return appointment, at least 7 days before you run out of medicine. Do not wait until you have 1 or 2 pills left, to come in. This is very poor planning and does not take into consideration that we may need to cancel appointments due to bad weather, sickness, or emergencies affecting our staff. DO NOT ACCEPT A "Partial Fill": If for any reason your pharmacy does not have enough pills/tablets to completely fill or refill your prescription, do not allow for a "partial fill". The law allows the pharmacy to complete that prescription within 72 hours, without requiring a new prescription. If they do not fill the rest of your prescription within those 72 hours, you will need a separate prescription to fill the remaining amount, which we will NOT provide. If the reason for the partial fill is your insurance, you will need to talk to the pharmacist about payment alternatives for  the remaining tablets, but again, DO NOT ACCEPT A PARTIAL FILL, unless you can trust your pharmacist to obtain   the remainder of the pills within 72 hours.  Prescription refills and/or changes in medication(s):  Prescription refills, and/or changes in dose or medication, will be conducted only during scheduled medication management appointments. (Applies to both, written and electronic prescriptions.) No refills on procedure days. No medication will be changed or started on procedure days. No changes, adjustments, and/or refills will be conducted on a procedure day. Doing so will interfere with the diagnostic portion of the procedure. No phone refills. No medications will be "called into the pharmacy". No Fax refills. No weekend refills. No Holliday refills. No after hours refills.  Remember:  Business hours are:  Monday to Thursday 8:00 AM to 4:00 PM Provider's Schedule: Doryce Mcgregory, MD - Appointments are:  Medication management: Monday and Wednesday 8:00 AM to 4:00 PM Procedure day: Tuesday and Thursday 7:30 AM to 4:00 PM Bilal Lateef, MD - Appointments are:  Medication management: Tuesday and Thursday 8:00 AM to 4:00 PM Procedure day: Monday and Wednesday 7:30 AM to 4:00 PM (Last update: 04/28/2022) ______________________________________________________________________  ____________________________________________________________________________________________  Pharmacy Shortages of Pain Medication   Introduction Shockingly as it may seem, .  "No U.S. Supreme Court decision has ever interpreted the Constitution as guaranteeing a right to health care for all Americans." - https://www.healthequityandpolicylab.com/elusive-right-to-health-care-under-us-law  "With respect to human rights, the [place] has no formally codified right to health, nor does it participate in a human rights treaty that specifies a right to health." - Scott J. Schweikart, JD, MBE  Situation By  now, most of our patients have had the experience of being told by their pharmacist that they do not have enough medication to cover their prescription. If you have not had this experience, just know that you soon will.  Problem There appears to be a shortage of these medications, either at the national level or locally. This is happening with all pharmacies. When there is not enough medication, patients are offered a partial fill and they are told that they will try to get the rest of the medicine for them at a later time. If they do not have enough for even a partial fill, the pharmacists are telling the patients to call us (the prescribing physicians) to request that we send another prescription to another pharmacy to get the medicine.   This reordering of a controlled substance creates documentation problems where additional paperwork needs to be created to explain why two prescriptions for the same period of time and the same medicine are being prescribed to the same patient. It also creates situations where the last appointment note does not accurately reflect when and what prescriptions were given to a patient. This leads to prescribing errors down the line, in subsequent follow-up visits.   [place] Board of Pharmacy ([place]BOP) Research revealed that Board of Pharmacy Rule .1806 (21 NCAC 46.1806) authorizes pharmacists to the transfer of prescriptions among pharmacies, and it sets forth procedural and recordkeeping requirements for doing so. However, this requires the pharmacist to complete the previously mentioned procedural paperwork to accomplish the transfer. As it turns out, it is much easier for them to have the prescribing physicians do the work.   Possible solutions 1. You can ask your physician to assist you in weaning yourself off these medications. 2. Ask your pharmacy if the medication is in stock, 3 days prior to your refill. 3. If you need a pharmacy change, let us know at your  medication management visit. Prescriptions that have already been electronically sent to a pharmacy will   not be re-sent to a different pharmacy if your pharmacy of record does not have it in stock. Proper stocking of medication is a pharmacy problem, not a prescriber problem. Work with your pharmacist to solve the problem. 4. Have the [place] Assembly add a provision to the "STOP ACT" (the law that mandates how controlled substances are prescribed) where there is an exception to the electronic prescribing rule that states that in the event there are shortages of medications the physicians are allowed to use written prescriptions as opposed to electronic ones. This would allow patients to take their prescriptions to a different pharmacy that may have enough medication available to fill the prescription. The problem is that currently there is a law that does not allow for written prescriptions, with the exception of instances where the electronic medical record is down due to technical issues.  5. Have [place] Congress ease the pressure on pharmaceutical companies, allowing them to produce enough quantities of the medication to adequately supply the population. 6. Have pharmacies keep enough stocks of these medications to cover their client base.  7. Have the [place] Assembly add a provision to the "STOP ACT" where they ease the regulations surrounding the transfer of controlled substances between pharmacies, so as to simplify the transfer of supplies. As an alternative, develop a system to allow patients to obtain the remainder of their prescription at another one of their pharmacies or at an associate pharmacy.   How this shortage will affect you.  Understand that this is a pharmacy supply problem, not a prescriber problem. Work with your pharmacy to solve it. The job of the prescriber is to evaluate and monitor the patient for the appropriate indications and use of these medicines. It is  not the job of the prescriber to supply the medication or to solve problems with that supply. The responsibility and the choice to obtain the medication resides on the patient. By law, supplying the medication is the job of the pharmacy. It is certainly not the job of the prescriber to solve supply problems.   Due to the above problems we are no longer taking patients to write for their pain medication. Future discussions with your physician may include potentially weaning medications or transitioning to alternatives.  We will be focusing primarily on interventional based pain management. We will continue to evaluate for appropriate indications and we may provide recommendations regarding medication, dose, and schedule, as well as monitoring recommendations, however, we will not be taking over the actual prescribing of these substances. On those patients where we are treating their chronic pain with interventional therapies, exceptions will be considered on a case by case basis. At this time, we will try to continue providing this supplemental service to those patients we have been managing in the past. However, as of August 1st, 2023, we no longer will be sending additional prescriptions to other pharmacies for the purpose of solving their supply problems. Once we send a prescription to a pharmacy, we will not be resending it again to another pharmacy to cover for their shortages.   What to do. Write as many letters as you can. Recruit the help of family members in writing these letters. Below are some of the places where you can write to make your voice heard. Let them know what the problem is and push them to look for solutions.   Search internet for: "[place] find your legislators" https://www.ncleg.gov/findyourlegislators  Search internet for: "[place] insurance commissioner   complaints" https://www.ncdoi.gov/contactscomplaints/assistance-or-file-complaint  Search internet for: "[place] Board of Pharmacy complaints" http://www.ncbop.org/contact.htm  Search internet for: "[facility] pharmacy complaints" Email [facility] Customer Relations https://www.[facility].com/help/email-customer-relations.jsp?callType=store  Search internet for: "[facility] pharmacy customer service complaints" https://www.[facility].com/topic/marketing/contactus/contactus_customerservice.jsp  ____________________________________________________________________________________________  ____________________________________________________________________________________________  Drug Holidays (Slow)  What is a "Drug Holiday"? Drug Holiday: is the name given to the period of time during which a patient stops taking a medication(s) for the purpose of eliminating tolerance to the drug.  Benefits Improved effectiveness of opioids. Decreased opioid dose needed to achieve benefits. Improved pain with lesser dose.  What is tolerance? Tolerance: is the progressive decreased in effectiveness of a drug due to its repetitive use. With repetitive use, the body gets use to the medication and as a consequence, it loses its effectiveness. This is a common problem seen with opioid pain medications. As a result, a larger dose of the drug is needed to achieve the same effect that used to be obtained with a smaller dose.  How long should a "Drug Holiday" last? You should stay off of the pain medicine for at least 14 consecutive days. (2 weeks)  Should I stop the medicine "cold turkey"? No. You should always coordinate with your Pain Specialist so that he/she can provide you with the correct medication dose to make the transition as smoothly as possible.  How do I stop the medicine? Slowly. You will be instructed to decrease the daily amount of pills that you take by one (1) pill every seven (7) days. This is called a "slow downward taper" of your dose. For example: if you normally take four (4) pills per day, you will  be asked to drop this dose to three (3) pills per day for seven (7) days, then to two (2) pills per day for seven (7) days, then to one (1) per day for seven (7) days, and at the end of those last seven (7) days, this is when the "Drug Holiday" would start.   Will I have withdrawals? By doing a "slow downward taper" like this one, it is unlikely that you will experience any significant withdrawal symptoms. Typically, what triggers withdrawals is the sudden stop of a high dose opioid therapy. Withdrawals can usually be avoided by slowly decreasing the dose over a prolonged period of time. If you do not follow these instructions and decide to stop your medication abruptly, withdrawals may be possible.  What are withdrawals? Withdrawals: refers to the wide range of symptoms that occur after stopping or dramatically reducing opiate drugs after heavy and prolonged use. Withdrawal symptoms do not occur to patients that use low dose opioids, or those who take the medication sporadically. Contrary to benzodiazepine (example: Valium, Xanax, etc.) or alcohol withdrawals ("Delirium Tremens"), opioid withdrawals are not lethal. Withdrawals are the physical manifestation of the body getting rid of the excess receptors.  Expected Symptoms Early symptoms of withdrawal may include: Agitation Anxiety Muscle aches Increased tearing Insomnia Runny nose Sweating Yawning  Late symptoms of withdrawal may include: Abdominal cramping Diarrhea Dilated pupils Goose bumps Nausea Vomiting  Will I experience withdrawals? Due to the slow nature of the taper, it is very unlikely that you will experience any.  What is a slow taper? Taper: refers to the gradual decrease in dose.  (Last update: 01/24/2020) ____________________________________________________________________________________________   ____________________________________________________________________________________________  CBD (cannabidiol) &  Delta-8 (Delta-8 tetrahydrocannabinol) WARNING  Intro: Cannabidiol (CBD) and tetrahydrocannabinol (THC), are two natural compounds found in plants of the Cannabis genus. They can both be extracted   from hemp or cannabis. Hemp and cannabis come from the Cannabis sativa plant. Both compounds interact with your body's endocannabinoid system, but they have very different effects. CBD does not produce the high sensation associated with cannabis. Delta-8 tetrahydrocannabinol, also known as delta-8 THC, is a psychoactive substance found in the Cannabis sativa plant, of which marijuana and hemp are two varieties. THC is responsible for the high associated with the illicit use of marijuana.  Applicable to: All individuals currently taking or considering taking CBD (cannabidiol) and, more important, all patients taking opioid analgesic controlled substances (pain medication). (Example: oxycodone; oxymorphone; hydrocodone; hydromorphone; morphine; methadone; tramadol; tapentadol; fentanyl; buprenorphine; butorphanol; dextromethorphan; meperidine; codeine; etc.)  Legal status: CBD remains a Schedule I drug prohibited for any use. CBD is illegal with one exception. In the [place], CBD has a limited Food and Drug Administration (FDA) approval for the treatment of two specific types of epilepsy disorders. Only one CBD product has been approved by the FDA for this purpose: "Epidiolex". FDA is aware that some companies are marketing products containing cannabis and cannabis-derived compounds in ways that violate the Federal Food, Drug and Cosmetic Act (FD&C Act) and that may put the health and safety of consumers at risk. The FDA, a Federal agency, has not enforced the CBD status since 2018. UPDATE: (08/22/2021) The Drug Enforcement Agency (DEA) issued a letter stating that "delta" cannabinoids, including Delta-8-THCO and Delta-9-THCO, synthetically derived from hemp do not qualify as hemp and will be viewed as Schedule I  drugs. (Schedule I drugs, substances, or chemicals are defined as drugs with no currently accepted medical use and a high potential for abuse. Some examples of Schedule I drugs are: heroin, lysergic acid diethylamide (LSD), marijuana (cannabis), 3,4-methylenedioxymethamphetamine (ecstasy), methaqualone, and peyote.) (https://www.dea.gov)  Legality: Some manufacturers ship CBD products nationally, which is illegal. Often such products are sold online and are therefore available throughout the country. CBD is openly sold in head shops and health food stores in some states where such sales have not been explicitly legalized. Selling unapproved products with unsubstantiated therapeutic claims is not only a violation of the law, but also can put patients at risk, as these products have not been proven to be safe or effective. Federal illegality makes it difficult to conduct research on CBD.  Reference: "FDA Regulation of Cannabis and Cannabis-Derived Products, Including Cannabidiol (CBD)" - https://www.fda.gov/news-events/public-health-focus/fda-regulation-cannabis-and-cannabis-derived-products-including-cannabidiol-cbd  Warning: CBD is not FDA approved and has not undergo the same manufacturing controls as prescription drugs.  This means that the purity and safety of available CBD may be questionable. Most of the time, despite manufacturer's claims, it is contaminated with THC (delta-9-tetrahydrocannabinol - the chemical in marijuana responsible for the "HIGH").  When this is the case, the THC contaminant will trigger a positive urine drug screen (UDS) test for Marijuana (carboxy-THC). Because a positive UDS for any illicit substance is a violation of our medication agreement, your opioid analgesics (pain medicine) may be permanently discontinued. The FDA recently put out a warning about 5 things that everyone should be aware of regarding Delta-8 THC: Delta-8 THC products have not been evaluated or approved by  the FDA for safe use and may be marketed in ways that put the public health at risk. The FDA has received adverse event reports involving delta-8 THC-containing products. Delta-8 THC has psychoactive and intoxicating effects. Delta-8 THC manufacturing often involve use of potentially harmful chemicals to create the concentrations of delta-8 THC claimed in the marketplace. The final delta-8 THC product may have potentially   harmful by-products (contaminants) due to the chemicals used in the process. Manufacturing of delta-8 THC products may occur in uncontrolled or unsanitary settings, which may lead to the presence of unsafe contaminants or other potentially harmful substances. Delta-8 THC products should be kept out of the reach of children and pets.  MORE ABOUT CBD  General Information: CBD was discovered in 1940 and it is a derivative of the cannabis sativa genus plants (Marijuana and Hemp). It is one of the 113 identified substances found in Marijuana. It accounts for up to 40% of the plant's extract. As of 2018, preliminary clinical studies on CBD included research for the treatment of anxiety, movement disorders, and pain. CBD is available and consumed in multiple forms, including inhalation of smoke or vapor, as an aerosol spray, and by mouth. It may be supplied as an oil containing CBD, capsules, dried cannabis, or as a liquid solution. CBD is thought not to be as psychoactive as THC (delta-9-tetrahydrocannabinol - the chemical in marijuana responsible for the "HIGH"). Studies suggest that CBD may interact with different biological target receptors in the body, including cannabinoid and other neurotransmitter receptors. As of 2018 the mechanism of action for its biological effects has not been determined.  Side-effects  Adverse reactions: Dry mouth, diarrhea, decreased appetite, fatigue, drowsiness, malaise, weakness, sleep disturbances, and others.  Drug interactions: CBC may interact with other  medications such as blood-thinners. Because CBD causes drowsiness on its own, it also increases the drowsiness caused by other medications, including antihistamines (such as Benadryl), benzodiazepines (Xanax, Ativan, Valium), antipsychotics, antidepressants and opioids, as well as alcohol and supplements such as kava, melatonin and St. John's Wort. Be cautious with the following combinations:   Brivaracetam (Briviact) Brivaracetam is changed and broken down by the body. CBD might decrease how quickly the body breaks down brivaracetam. This might increase levels of brivaracetam in the body.  Caffeine Caffeine is changed and broken down by the body. CBD might decrease how quickly the body breaks down caffeine. This might increase levels of caffeine in the body.  Carbamazepine (Tegretol) Carbamazepine is changed and broken down by the body. CBD might decrease how quickly the body breaks down carbamazepine. This might increase levels of carbamazepine in the body and increase its side effects.  Citalopram (Celexa) Citalopram is changed and broken down by the body. CBD might decrease how quickly the body breaks down citalopram. This might increase levels of citalopram in the body and increase its side effects.  Clobazam (Onfi) Clobazam is changed and broken down by the liver. CBD might decrease how quickly the liver breaks down clobazam. This might increase the effects and side effects of clobazam.  Eslicarbazepine (Aptiom) Eslicarbazepine is changed and broken down by the body. CBD might decrease how quickly the body breaks down eslicarbazepine. This might increase levels of eslicarbazepine in the body by a small amount.  Everolimus (Zostress) Everolimus is changed and broken down by the body. CBD might decrease how quickly the body breaks down everolimus. This might increase levels of everolimus in the body.  Lithium Taking higher doses of CBD might increase levels of lithium. This can increase  the risk of lithium toxicity.  Medications changed by the liver (Cytochrome P450 1A1 (CYP1A1) substrates) Some medications are changed and broken down by the liver. CBD might change how quickly the liver breaks down these medications. This could change the effects and side effects of these medications.  Medications changed by the liver (Cytochrome P450 1A2 (CYP1A2) substrates) Some medications are   changed and broken down by the liver. CBD might change how quickly the liver breaks down these medications. This could change the effects and side effects of these medications.  Medications changed by the liver (Cytochrome P450 1B1 (CYP1B1) substrates) Some medications are changed and broken down by the liver. CBD might change how quickly the liver breaks down these medications. This could change the effects and side effects of these medications.  Medications changed by the liver (Cytochrome P450 2A6 (CYP2A6) substrates) Some medications are changed and broken down by the liver. CBD might change how quickly the liver breaks down these medications. This could change the effects and side effects of these medications.  Medications changed by the liver (Cytochrome P450 2B6 (CYP2B6) substrates) Some medications are changed and broken down by the liver. CBD might change how quickly the liver breaks down these medications. This could change the effects and side effects of these medications.  Medications changed by the liver (Cytochrome P450 2C19 (CYP2C19) substrates) Some medications are changed and broken down by the liver. CBD might change how quickly the liver breaks down these medications. This could change the effects and side effects of these medications.  Medications changed by the liver (Cytochrome P450 2C8 (CYP2C8) substrates) Some medications are changed and broken down by the liver. CBD might change how quickly the liver breaks down these medications. This could change the effects and side effects  of these medications.  Medications changed by the liver (Cytochrome P450 2C9 (CYP2C9) substrates) Some medications are changed and broken down by the liver. CBD might change how quickly the liver breaks down these medications. This could change the effects and side effects of these medications.  Medications changed by the liver (Cytochrome P450 2D6 (CYP2D6) substrates) Some medications are changed and broken down by the liver. CBD might change how quickly the liver breaks down these medications. This could change the effects and side effects of these medications.  Medications changed by the liver (Cytochrome P450 2E1 (CYP2E1) substrates) Some medications are changed and broken down by the liver. CBD might change how quickly the liver breaks down these medications. This could change the effects and side effects of these medications.  Medications changed by the liver (Cytochrome P450 3A4 (CYP3A4) substrates) Some medications are changed and broken down by the liver. CBD might change how quickly the liver breaks down these medications. This could change the effects and side effects of these medications.  Medications changed by the liver (Glucuronidated drugs) Some medications are changed and broken down by the liver. CBD might change how quickly the liver breaks down these medications. This could change the effects and side effects of these medications.  Medications that decrease the breakdown of other medications by the liver (Cytochrome P450 2C19 (CYP2C19) inhibitors) CBD is changed and broken down by the liver. Some drugs decrease how quickly the liver changes and breaks down CBD. This could change the effects and side effects of CBD.  Medications that decrease the breakdown of other medications in the liver (Cytochrome P450 3A4 (CYP3A4) inhibitors) CBD is changed and broken down by the liver. Some drugs decrease how quickly the liver changes and breaks down CBD. This could change the effects  and side effects of CBD.  Medications that increase breakdown of other medications by the liver (Cytochrome P450 3A4 (CYP3A4) inducers) CBD is changed and broken down by the liver. Some drugs increase how quickly the liver changes and breaks down CBD. This could change the effects and side effects of   CBD.  Medications that increase the breakdown of other medications by the liver (Cytochrome P450 2C19 (CYP2C19) inducers) CBD is changed and broken down by the liver. Some drugs increase how quickly the liver changes and breaks down CBD. This could change the effects and side effects of CBD.  Methadone (Dolophine) Methadone is broken down by the liver. CBD might decrease how quickly the liver breaks down methadone. Taking cannabidiol along with methadone might increase the effects and side effects of methadone.  Rufinamide (Banzel) Rufinamide is changed and broken down by the body. CBD might decrease how quickly the body breaks down rufinamide. This might increase levels of rufinamide in the body by a small amount.  Sedative medications (CNS depressants) CBD might cause sleepiness and slowed breathing. Some medications, called sedatives, can also cause sleepiness and slowed breathing. Taking CBD with sedative medications might cause breathing problems and/or too much sleepiness.  Sirolimus (Rapamune) Sirolimus is changed and broken down by the body. CBD might decrease how quickly the body breaks down sirolimus. This might increase levels of sirolimus in the body.  Stiripentol (Diacomit) Stiripentol is changed and broken down by the body. CBD might decrease how quickly the body breaks down stiripentol. This might increase levels of stiripentol in the body and increase its side effects.  Tacrolimus (Prograf) Tacrolimus is changed and broken down by the body. CBD might decrease how quickly the body breaks down tacrolimus. This might increase levels of tacrolimus in the body.  Tamoxifen  (Soltamox) Tamoxifen is changed and broken down by the body. CBD might affect how quickly the body breaks down tamoxifen. This might affect levels of tamoxifen in the body.  Topiramate (Topamax) Topiramate is changed and broken down by the body. CBD might decrease how quickly the body breaks down topiramate. This might increase levels of topiramate in the body by a small amount.  Valproate Valproic acid can cause liver injury. Taking cannabidiol with valproic acid might increase the chance of liver injury. CBD and/or valproic acid might need to be stopped, or the dose might need to be reduced.  Warfarin (Coumadin) CBD might increase levels of warfarin, which can increase the risk for bleeding. CBD and/or warfarin might need to be stopped, or the dose might need to be reduced.  Zonisamide Zonisamide is changed and broken down by the body. CBD might decrease how quickly the body breaks down zonisamide. This might increase levels of zonisamide in the body by a small amount. (Last update: 09/03/2021) ____________________________________________________________________________________________  Naloxone Nasal Spray What is this medication? NALOXONE (nal OX one) treats opioid overdose, which causes slow or shallow breathing, severe drowsiness, or trouble staying awake. Call emergency services after using this medication. You may need additional treatment. Naloxone works by reversing the effects of opioids. It belongs to a group of medications called opioid blockers. This medicine may be used for other purposes; ask your health care provider or pharmacist if you have questions. COMMON BRAND NAME(S): Kloxxado, Narcan What should I tell my care team before I take this medication? They need to know if you have any of these conditions: Heart disease Substance use disorder An unusual or allergic reaction to naloxone, other medications, foods, dyes, or preservatives Pregnant or trying to get  pregnant Breast-feeding How should I use this medication? This medication is for use in the nose. Lay the person on their back. Support their neck with your hand and allow the head to tilt back before giving the medication. The nasal spray should be given into   1 nostril. After giving the medication, move the person onto their side. Do not remove or test the nasal spray until ready to use. Get emergency medical help right away after giving the first dose of this medication, even if the person wakes up. You should be familiar with how to recognize the signs and symptoms of a narcotic overdose. If more doses are needed, give the additional dose in the other nostril. Talk to your care team about the use of this medication in children. While this medication may be prescribed for children as young as newborns for selected conditions, precautions do apply. Overdosage: If you think you have taken too much of this medicine contact a poison control center or emergency room at once. NOTE: This medicine is only for you. Do not share this medicine with others. What if I miss a dose? This does not apply. What may interact with this medication? This is only used during an emergency. No interactions are expected during emergency use. This list may not describe all possible interactions. Give your health care provider a list of all the medicines, herbs, non-prescription drugs, or dietary supplements you use. Also tell them if you smoke, drink alcohol, or use illegal drugs. Some items may interact with your medicine. What should I watch for while using this medication? Keep this medication ready for use in the case of an opioid overdose. Make sure that you have the phone number of your care team and local hospital ready. You may need to have additional doses of this medication. Each nasal spray contains a single dose. Some emergencies may require additional doses. After use, bring the treated person to the nearest  hospital or call 911. Make sure the treating care team knows that the person has received a dose of this medication. You will receive additional instructions on what to do during and after use of this medication before an emergency occurs. What side effects may I notice from receiving this medication? Side effects that you should report to your care team as soon as possible: Allergic reactions--skin rash, itching, hives, swelling of the face, lips, tongue, or throat Side effects that usually do not require medical attention (report these to your care team if they continue or are bothersome): Constipation Dryness or irritation inside the nose Headache Increase in blood pressure Muscle spasms Stuffy nose Toothache This list may not describe all possible side effects. Call your doctor for medical advice about side effects. You may report side effects to FDA at 1-800-FDA-1088. Where should I keep my medication? Keep out of the reach of children and pets. Store between 20 and 25 degrees C (68 and 77 degrees F). Do not freeze. Throw away any unused medication after the expiration date. Keep in original box until ready to use. NOTE: This sheet is a summary. It may not cover all possible information. If you have questions about this medicine, talk to your doctor, pharmacist, or health care provider.  2023 [employer]vier/Gold Standard (2021-02-28 00:00:00)  

## 2022-05-06 ENCOUNTER — Encounter: Payer: Self-pay | Admitting: Pain Medicine

## 2022-05-06 ENCOUNTER — Ambulatory Visit: Payer: BC Managed Care – PPO | Attending: Pain Medicine | Admitting: Pain Medicine

## 2022-05-06 VITALS — BP 132/89 | HR 102 | Temp 97.0°F | Resp 16 | Ht 70.0 in | Wt 171.0 lb

## 2022-05-06 DIAGNOSIS — M79604 Pain in right leg: Secondary | ICD-10-CM | POA: Insufficient documentation

## 2022-05-06 DIAGNOSIS — M5137 Other intervertebral disc degeneration, lumbosacral region: Secondary | ICD-10-CM | POA: Insufficient documentation

## 2022-05-06 DIAGNOSIS — M79605 Pain in left leg: Secondary | ICD-10-CM | POA: Diagnosis present

## 2022-05-06 DIAGNOSIS — G894 Chronic pain syndrome: Secondary | ICD-10-CM | POA: Insufficient documentation

## 2022-05-06 DIAGNOSIS — M5442 Lumbago with sciatica, left side: Secondary | ICD-10-CM | POA: Diagnosis not present

## 2022-05-06 DIAGNOSIS — M961 Postlaminectomy syndrome, not elsewhere classified: Secondary | ICD-10-CM | POA: Insufficient documentation

## 2022-05-06 DIAGNOSIS — G8929 Other chronic pain: Secondary | ICD-10-CM | POA: Diagnosis present

## 2022-05-06 DIAGNOSIS — Z79891 Long term (current) use of opiate analgesic: Secondary | ICD-10-CM | POA: Diagnosis present

## 2022-05-06 DIAGNOSIS — M5441 Lumbago with sciatica, right side: Secondary | ICD-10-CM | POA: Diagnosis present

## 2022-05-06 DIAGNOSIS — Z79899 Other long term (current) drug therapy: Secondary | ICD-10-CM | POA: Insufficient documentation

## 2022-05-06 MED ORDER — OXYCODONE HCL 10 MG PO TABS: 10.0000 mg | ORAL_TABLET | Freq: Four times a day (QID) | ORAL | 0 refills | Status: DC | PRN

## 2022-05-06 MED ORDER — NALOXONE HCL 4 MG/0.1ML NA LIQD: 1.0000 | NASAL | 0 refills | Status: AC | PRN

## 2022-05-06 NOTE — Progress Notes (Signed)
Nursing Pain Medication Assessment:  Safety precautions to be maintained throughout the outpatient stay will include: orient to surroundings, keep bed in low position, maintain call bell within reach at all times, provide assistance with transfer out of bed and ambulation.  Medication Inspection Compliance: Pill count conducted under aseptic conditions, in front of the patient. Neither the pills nor the bottle was removed from the patient's sight at any time. Once count was completed pills were immediately returned to the patient in their original bottle.  Medication: Oxycodone IR Pill/Patch Count:  33 of 120 pills remain Pill/Patch Appearance: Markings consistent with prescribed medication Bottle Appearance: Standard pharmacy container. Clearly labeled. Filled Date: 45 / 12 / 2023 Last Medication intake:  Today

## 2022-08-03 NOTE — Progress Notes (Unsigned)
PROVIDER NOTE: Information contained herein reflects review and annotations entered in association with encounter. Interpretation of such information and data should be left to medically-trained personnel. Information provided to patient can be located elsewhere in the medical record under "Patient Instructions". Document created using STT-dictation technology, any transcriptional errors that may result from process are unintentional.    Patient: Derek [name]  Service Category: E/M  Provider: Oswaldo Done, MD  DOB: Feb 13, 1981  DOS: 08/05/2022  Referring Provider: Wilford Corner  MRN: [id number]  Specialty: Interventional Pain Management  PCP: Wilford Corner, PA-C  Type: Established Patient  Setting: Ambulatory outpatient    Location: Office  Delivery: Face-to-face     HPI  [name], a 42 y.o. year old male, is here today because of his Chronic pain syndrome [G89.4]. [name] primary complain today is No chief complaint on file. Last encounter: My last encounter with him was on 05/06/2022. Pertinent problems: [name] has Lumbar pseudoarthrosis (L5-S1); Chronic low back pain (1ry area of Pain) (Bilateral) (L>R) w/ sciatica (Bilateral); Chronic lower extremity pain (2ry area of Pain) (Bilateral) (L>R); Chronic pain syndrome; Failed back surgical syndrome (x3); L5-S1 pseudoarthrosis; Chronic musculoskeletal pain; Spasm of back muscles; Sacroiliac joint dysfunction (Bilateral); Chronic sacroiliac joint pain (Bilateral) (L>R); Somatic dysfunction of sacroiliac joints (Bilateral); Chronic hip pain (Bilateral); Epidural fibrosis; Lumbar postlaminectomy syndrome; DDD (degenerative disc disease), lumbosacral; Lumbar facet syndrome (Bilateral) (L>R); Other specified dorsopathies, sacral and sacrococcygeal region; Spondylosis without myelopathy or radiculopathy, lumbosacral region; Neurogenic pain; Osteoarthritic spondylosis of lumbar spine; Tendinitis of elbows (Bilateral);  Lateral epicondylitis of elbows (Bilateral); Chronic elbow pain (Right); Chronic low back pain (Left) w/o sciatica; Numbness of anterior thigh (Right); Burning pain in thigh (Right); Chronic low back pain (Midline) w/o sciatica; Acute exacerbation of chronic low back pain; Chronic elbow pain (Bilateral); Radial collateral ligament sprain of elbow, unspecified laterality, sequela (Bilateral); Enthesopathy of elbow region (Bilateral); Sprain of lateral collateral ligament of elbow, sequela (Left); Sprain of lateral collateral ligament of elbow, sequela (Right); Radicular pain of lumbosacral region; Lumbosacral radiculopathy; and Chronic low back pain (Bilateral) w/o sciatica on their pertinent problem list. Pain Assessment: Severity of   is reported as a  /10. Location:    / . Onset:  . Quality:  . Timing:  . Modifying factor(s):  Marland Kitchen Vitals:  vitals were not taken for this visit.  BMI: Estimated body mass index is 24.54 kg/m as calculated from the following:   Height as of 05/06/22: 5\' 10"  (1.778 m).   Weight as of 05/06/22: 171 lb (77.6 kg).  Reason for encounter: medication management. ***  RTCB: 11/12/2022  Nonopioids transferred 05/02/2020: Zanaflex, Flexeril, and Mobic  Pharmacotherapy Assessment  Analgesic: Oxycodone IR 10 mg, 1 tab PO q 6 hrs (40 mg/day of oxycodone) MME/day: 60 mg/day.   Monitoring: [place] PMP: PDMP reviewed during this encounter.       Pharmacotherapy: No side-effects or adverse reactions reported. Compliance: No problems identified. Effectiveness: Clinically acceptable.  No notes on file  No results found for: "CBDTHCR" No results found for: "D8THCCBX" No results found for: "D9THCCBX"  UDS:  Summary  Date Value Ref Range Status  02/02/2022 Note  Final    Comment:    ==================================================================== [facility] (MW) ==================================================================== Test                             Result        Flag  Units  Drug Present and Declared for Prescription Verification   Oxycodone                      2636         EXPECTED   ng/mg creat   Oxymorphone                    5162         EXPECTED   ng/mg creat   Noroxycodone                   3235         EXPECTED   ng/mg creat   Noroxymorphone                 1681         EXPECTED   ng/mg creat    Sources of oxycodone are scheduled prescription medications.    Oxymorphone, noroxycodone, and noroxymorphone are expected    metabolites of oxycodone. Oxymorphone is also available as a    scheduled prescription medication.  ==================================================================== Test                      Result    Flag   Units      Ref Range   Creatinine              165              mg/dL      >=97 ==================================================================== Declared Medications:  The flagging and interpretation on this report are based on the  following declared medications.  Unexpected results may arise from  inaccuracies in the declared medications.   **Note: The testing scope of this panel includes these medications:   Oxycodone   **Note: The testing scope of this panel does not include the  following reported medications:   Calcium  Cholecalciferol  Cyclobenzaprine  Diclofenac  Esomeprazole (Nexium)  Meloxicam (Mobic)  Tizanidine (Zanaflex) ==================================================================== For clinical consultation, please call [phone]. ====================================================================       ROS  Constitutional: Denies any fever or chills Gastrointestinal: No reported hemesis, hematochezia, vomiting, or acute GI distress Musculoskeletal: Denies any acute onset joint swelling, redness, loss of ROM, or weakness Neurological: No reported episodes of acute onset apraxia, aphasia, dysarthria, agnosia, amnesia, paralysis, loss of coordination, or loss  of consciousness  Medication Review  Calcium Carb-Cholecalciferol, Diclofenac Sodium, Oxycodone HCl, celecoxib, cyclobenzaprine, esomeprazole, meloxicam, naloxone, and tiZANidine  History Review  Allergy: Derek [name] is allergic to hydrocodone, amoxicillin, and vicodin [hydrocodone-acetaminophen]. Drug: Derek [name]  reports no history of drug use. Alcohol:  reports current alcohol use. Tobacco:  reports that he has been smoking cigarettes. He has a 16.00 pack-year smoking history. He has never used smokeless tobacco. Social: Derek [name]  reports that he has been smoking cigarettes. He has a 16.00 pack-year smoking history. He has never used smokeless tobacco. He reports current alcohol use. He reports that he does not use drugs. Medical:  has a past medical history of Anxiety, Arthritis, and GERD (gastroesophageal reflux disease). Surgical: Derek [name]  has a past surgical history that includes Back surgery; HAND REIMPLANTED; and Lumbar fusion (11/14). Family: family history is not on file.  Laboratory Chemistry Profile   Renal Lab Results  Component Value Date   BUN 11 01/16/2019   CREATININE 1.08 01/16/2019   BCR 10 01/16/2019   GFRAA 99 01/16/2019   GFRNONAA 86 01/16/2019  Hepatic Lab Results  Component Value Date   AST 24 01/16/2019   ALBUMIN 4.6 01/16/2019   ALKPHOS 108 01/16/2019    Electrolytes Lab Results  Component Value Date   NA 143 01/16/2019   K 4.0 01/16/2019   CL 105 01/16/2019   CALCIUM 10.2 01/16/2019   MG 2.0 01/16/2019    Bone Lab Results  Component Value Date   25OHVITD1 39 01/16/2019   25OHVITD2 1.2 01/16/2019   25OHVITD3 38 01/16/2019    Inflammation (CRP: Acute Phase) (ESR: Chronic Phase) Lab Results  Component Value Date   CRP 4 01/16/2019   ESRSEDRATE 29 (H) 01/16/2019         Note: Above Lab results reviewed.  Recent Imaging Review  DG PAIN CLINIC C-ARM 1-60 MIN NO REPORT Fluoro was used, but no Radiologist interpretation will be  provided.  Please refer to "NOTES" tab for provider progress note. Note: Reviewed        Physical Exam  General appearance: Well nourished, well developed, and well hydrated. In no apparent acute distress Mental status: Alert, oriented x 3 (person, place, & time)       Respiratory: No evidence of acute respiratory distress Eyes: PERLA Vitals: There were no vitals taken for this visit. BMI: Estimated body mass index is 24.54 kg/m as calculated from the following:   Height as of 05/06/22: 5\' 10"  (1.778 m).   Weight as of 05/06/22: 171 lb (77.6 kg). Ideal: Patient weight not recorded  Assessment   Diagnosis Status  1. Chronic pain syndrome   2. Pharmacologic therapy   3. DDD (degenerative disc disease), lumbosacral   4. Encounter for medication management   5. Failed back surgical syndrome (x3)   6. Encounter for chronic pain management   7. Chronic use of opiate for therapeutic purpose   8. Chronic lower extremity pain (2ry area of Pain) (Bilateral) (L>R)   9. Chronic low back pain (1ry area of Pain) (Bilateral) (L>R) w/ sciatica (Bilateral)    Controlled Controlled Controlled   Updated Problems: No problems updated.  Plan of Care  Problem-specific:  No problem-specific Assessment & Plan notes found for this encounter.  Derek [name] has a current medication list which includes the following long-term medication(s): calcium carb-cholecalciferol, esomeprazole, meloxicam, oxycodone hcl, and tizanidine.  Pharmacotherapy (Medications Ordered): No orders of the defined types were placed in this encounter.  Orders:  No orders of the defined types were placed in this encounter.  Follow-up plan:   No follow-ups on file.     Interventional Therapies  Risk  Complexity Considerations:   Estimated body mass index is 24.54 kg/m as calculated from the following:   Height as of this encounter: 5\' 10"  (1.778 m).   Weight as of this encounter: 171 lb (77.6 kg).    WNL    Planned  Pending:      Under consideration:   Possible bilateral lumbar facet RFA  Diagnostic bilateral SI Blk #1  Possible bilateral SI RFA  Possible spinal cord stimulator trial    Completed:   Therapeutic right L2-3 LESI x1 (08/29/2020)  Diagnostic left lumbar facet MBB x2 (01/15/2022) (100/100/75/75)  Diagnostic right lumbar facet MBB x1 (01/15/2022) (100/100/75/75)  Diagnostic left SI block x1 (03/21/2020)  Therapeutic/palliative right lateral epicondyle (elbow) injection x2 (01/21/2021)  Therapeutic/palliative left Racz procedure x7 (targeting the left S2, S3 area) (12/18/2021) (0/0/50/90) complete relief of the radicular component.  No benefit to the back pain.   Therapeutic  Palliative (PRN) options:  Palliative right lateral epicondyle (elbow) injection #2  Palliative left Racz procedure #7 (targeting the left S2, S3 area)    Pharmacotherapy  Nonopioids transferred 05/02/2020: Zanaflex, Flexeril, and Mobic      Recent Visits Date Type Provider Dept  05/06/22 Office Visit Milinda Pointer, MD [facility]  Showing recent visits within past 90 days and meeting all other requirements Future Appointments Date Type Provider Dept  08/05/22 Appointment Milinda Pointer, MD [facility]  Showing future appointments within next 90 days and meeting all other requirements  I discussed the assessment and treatment plan with the patient. The patient was provided an opportunity to ask questions and all were answered. The patient agreed with the plan and demonstrated an understanding of the instructions.  Patient advised to call back or seek an in-person evaluation if the symptoms or condition worsens.  Duration of encounter: *** minutes.  Total time on encounter, as per AMA guidelines included both the face-to-face and non-face-to-face time personally spent by the physician and/or other qualified health care professional(s) on the day of the encounter  (includes time in activities that require the physician or other qualified health care professional and does not include time in activities normally performed by clinical staff). Physician's time may include the following activities when performed: Preparing to see the patient (e.g., pre-charting review of records, searching for previously ordered imaging, lab work, and nerve conduction tests) Review of prior analgesic pharmacotherapies. Reviewing PMP Interpreting ordered tests (e.g., lab work, imaging, nerve conduction tests) Performing post-procedure evaluations, including interpretation of diagnostic procedures Obtaining and/or reviewing separately obtained history Performing a medically appropriate examination and/or evaluation Counseling and educating the patient/family/caregiver Ordering medications, tests, or procedures Referring and communicating with other health care professionals (when not separately reported) Documenting clinical information in the electronic or other health record Independently interpreting results (not separately reported) and communicating results to the patient/ family/caregiver Care coordination (not separately reported)  Note by: Gaspar Cola, MD Date: 08/05/2022; Time: 12:36 PM

## 2022-08-05 ENCOUNTER — Encounter: Payer: Self-pay | Admitting: Pain Medicine

## 2022-08-05 ENCOUNTER — Ambulatory Visit: Payer: BC Managed Care – PPO | Attending: Pain Medicine | Admitting: Pain Medicine

## 2022-08-05 VITALS — BP 136/81 | HR 95 | Temp 97.7°F | Resp 16 | Ht 70.0 in | Wt 174.0 lb

## 2022-08-05 DIAGNOSIS — Z79899 Other long term (current) drug therapy: Secondary | ICD-10-CM | POA: Insufficient documentation

## 2022-08-05 DIAGNOSIS — M5137 Other intervertebral disc degeneration, lumbosacral region: Secondary | ICD-10-CM | POA: Insufficient documentation

## 2022-08-05 DIAGNOSIS — M5441 Lumbago with sciatica, right side: Secondary | ICD-10-CM | POA: Diagnosis present

## 2022-08-05 DIAGNOSIS — G894 Chronic pain syndrome: Secondary | ICD-10-CM | POA: Insufficient documentation

## 2022-08-05 DIAGNOSIS — G8929 Other chronic pain: Secondary | ICD-10-CM

## 2022-08-05 DIAGNOSIS — M79604 Pain in right leg: Secondary | ICD-10-CM | POA: Diagnosis not present

## 2022-08-05 DIAGNOSIS — Z79891 Long term (current) use of opiate analgesic: Secondary | ICD-10-CM | POA: Diagnosis present

## 2022-08-05 DIAGNOSIS — M961 Postlaminectomy syndrome, not elsewhere classified: Secondary | ICD-10-CM | POA: Diagnosis not present

## 2022-08-05 DIAGNOSIS — M5442 Lumbago with sciatica, left side: Secondary | ICD-10-CM | POA: Diagnosis not present

## 2022-08-05 DIAGNOSIS — M79605 Pain in left leg: Secondary | ICD-10-CM | POA: Diagnosis present

## 2022-08-05 MED ORDER — OXYCODONE HCL 10 MG PO TABS: 10.0000 mg | ORAL_TABLET | Freq: Four times a day (QID) | ORAL | 0 refills | Status: DC | PRN

## 2022-08-05 NOTE — Patient Instructions (Signed)
____________________________________________________________________________________________  Opioid Pain Medication Update  To: All patients taking opioid pain medications. (I.e.: hydrocodone, hydromorphone, oxycodone, oxymorphone, morphine, codeine, methadone, tapentadol, tramadol, buprenorphine, fentanyl, etc.)  Re: Review of side effects and adverse reactions of opioid analgesics, as well as new information about long term effects of this class of medications.  Direct risks of long-term opioid therapy are not limited to opioid addiction and overdose. Potential medical risks include serious fractures, breathing problems during sleep, hyperalgesia, immunosuppression, chronic constipation, bowel obstruction, myocardial infarction, and tooth decay secondary to xerostomia.  Historically, the original case for using long-term opioid therapy to treat chronic noncancer pain was based on safety assumptions that subsequent experience has called into question. In 1996, the American Pain Society and the [place] [employer] issued a consensus statement supporting long-term opioid therapy. This statement acknowledged the dangers of opioid prescribing but concluded that the risk for addiction was low; respiratory depression induced by opioids was short-lived, occurred mainly in opioid-naive patients, and was antagonized by pain; tolerance was not a common problem; and efforts to control diversion should not constrain opioid prescribing. This has now proven to be wrong. Experience regarding the risks for opioid addiction, misuse, and overdose in community practice has failed to support these assumptions.  According to the Centers for Disease Control and Prevention, fatal overdoses involving opioid analgesics have increased sharply over the past decade. Currently, more than 96,700 people die from drug overdoses every year. Opioids are a factor in 7 out of every 10 overdose deaths. Deaths from drug  overdose have surpassed motor vehicle accidents as the leading cause of death for individuals between the ages of 79 and 72.  Clinical data suggest that neuroendocrine dysfunction may be very common in both men and women, potentially causing hypogonadism, erectile dysfunction, infertility, decreased libido, osteoporosis, and depression. Recent studies linked higher opioid dose to increased opioid-related mortality. Controlled observational studies reported that long-term opioid therapy may be associated with increased risk for cardiovascular events. Subsequent meta-analysis concluded that the safety of long-term opioid therapy in elderly patients has not been proven.   Side Effects and adverse reactions: Common side effects: Drowsiness (sedation). Dizziness. Nausea and vomiting. Constipation. Physical dependence -- Dependence often manifests with withdrawal symptoms when opioids are discontinued or decreased. Tolerance -- As you take repeated doses of opioids, you require increased medication to experience the same effect of pain relief. Respiratory depression -- This can occur in healthy people, especially with higher doses. However, people with COPD, asthma or other lung conditions may be even more susceptible to fatal respiratory impairment.  Uncommon side effects: An increased sensitivity to feeling pain and extreme response to pain (hyperalgesia). Chronic use of opioids can lead to this. Delayed gastric emptying (the process by which the contents of your stomach are moved into your small intestine). Muscle rigidity. Immune system and hormonal dysfunction. Quick, involuntary muscle jerks (myoclonus). Arrhythmia. Itchy skin (pruritus). Dry mouth (xerostomia).  Long-term side effects: Chronic constipation. Sleep-disordered breathing (SDB). Increased risk of bone fractures. Hypothalamic-pituitary-adrenal dysregulation. Increased risk of overdose.  RISKS: Fractures and Falls:   Opioids increase the risk and incidence of falls. This is of particular importance in elderly patients.  Endocrine System:  Long-term administration is associated with endocrine abnormalities. Influences on both the hypothalamic-pituitary-adrenal axis?and the hypothalamic-pituitary-gonadal axis have been demonstrated with consequent hypogonadism and adrenal insufficiency in both sexes. Hypogonadism and decreased levels of dehydroepiandrosterone sulfate have been reported in men and women. Endocrine effects can lead to: Amenorrhoea in women Reduced  libido in both sexes Erectile dysfunction in men Infertility Depression and fatigue Patients (particularly women of childbearing age) should avoid opioids. There is insufficient evidence to recommend routine monitoring of asymptomatic patients taking opioids in the long-term for hormonal deficiencies.  Immune System: Human studies have demonstrated that opioids have an immunomodulating effect. These effects are mediated via opioid receptors both on immune effector cells and in the central nervous system. Opioids have been demonstrated to have adverse effects on antimicrobial response and anti-tumour surveillance. Buprenorphine has been demonstrated to have no impact on immune function.  Opioid Induced Hyperalgesia: Human studies have demonstrated that prolonged use of opioids can lead to a state of abnormal pain sensitivity, sometimes called opioid induced hyperalgesia (OIH). Opioid induced hyperalgesia is not usually seen in the absence of tolerance to opioid analgesia. Clinically, hyperalgesia may be diagnosed if the patient on long-term opioid therapy presents with increased pain. This might be qualitatively and anatomically distinct from pain related to disease progression or to breakthrough pain resulting from development of opioid tolerance. Pain associated with hyperalgesia tends to be more diffuse than the pre-existing pain and less defined  in quality. Management of opioid induced hyperalgesia requires opioid dose reduction.  Cancer: Chronic opioid therapy has been associated with an increased risk of cancer among noncancer patients with chronic pain. This association was more evident in chronic strong opioid users. Chronic opioid consumption causes significant pathological changes in the small intestine and colon. Epidemiological studies have found that there is a link between opium dependence and initiation of gastrointestinal cancers. Cancer is the second leading cause of death after cardiovascular disease. Chronic use of opioids can cause multiple conditions such as GERD, immunosuppression and renal damage as well as carcinogenic effects, which are associated with the incidence of cancers.   Mortality: Long-term opioid use has been associated with increased mortality among patients with chronic non-cancer pain (CNCP).  Prescription of long-acting opioids for chronic noncancer pain was associated with a significantly increased risk of all-cause mortality, including deaths from causes other than overdose.  Reference: Von Korff M, Kolodny A, Deyo RA, Chou R. Long-term opioid therapy reconsidered. Ann Intern Med. 2011 Sep 6;155(5):325-8. doi: 10.7326/[id number]. PMID: 78469629; PMCID: [id number]. Morley Kos, Hayward RA, Dunn KM, [place] KP. Risk of adverse events in patients prescribed long-term opioids: A cohort study in the [place] Clinical Practice Research Datalink. Eur J Pain. 2019 May;23(5):908-922. doi: 10.1002/ejp.1357. Epub 2019 Jan 31. PMID: 10272536. Colameco S, Coren JS, Ciervo CA. Continuous opioid treatment for chronic noncancer pain: a time for moderation in prescribing. Postgrad Med. 2009 Jul;121(4):61-6. doi: 10.3810/pgm.2009.07.2032. PMID: 64403474. Heywood Bene RN, Pelican SD, Blazina I, Rosalio Loud, Bougatsos C, Deyo RA. The effectiveness and risks of long-term  opioid therapy for chronic pain: a systematic review for a [employer] of Health Pathways to [employer]. Ann Intern Med. 2015 Feb 17;162(4):276-86. doi: 25.9563/O75-6433. PMID: 29518841. Marjory Sneddon [facility], Makuc DM. NCHS Data Brief No. 22. [place]: Centers for Disease Control and Prevention; 2009. Sep, Increase in Fatal Poisonings Involving Opioid Analgesics in the [place], 1999-2006. Song IA, Choi HR, Oh TK. Long-term opioid use and mortality in patients with chronic non-cancer pain: Ten-year follow-up study in [place] from 2010 through 2019. EClinicalMedicine. 2022 Jul 18;51:101558. doi: 10.1016/j.eclinm.2022.660630. PMID: 16010932; PMCID: [id number]. Huser, W., Schubert, T., Vogelmann, T. et al. All-cause mortality in patients with long-term opioid therapy compared with non-opioid analgesics for chronic non-cancer pain: a database study. [place] Med 18,  162 (2020). [email or web address].[email or web address] Rashidian H, Roxy Cedar, Malekzadeh R, Haghdoost AA. An Ecological Study of the Association between Opiate Use and Incidence of Cancers. Addict Health. 2016 Fall;8(4):252-260. PMID: 51884166; PMCID: [id number].  Our Goals and Recommendations: Our goal is to control your pain with means other than the use of opioid pain medications. Talk to your physician about coming off of these medications. We can assist you with the tapering down and stopping these medicines. Based on the information above, even if you cannot completely stop these medicines, even a decrease in the dose has been shown to be associated with a decreased risk.  ____________________________________________________________________________________________     ____________________________________________________________________________________________  National Pain Medication Shortage  The [place] is experiencing worsening drug shortages. These have had a negative widespread effect on patient care  and treatment. Not expected to improve any time soon. Predicted to last past 2029.   Drug shortage list (generic names) Oxycodone IR Oxycodone/APAP Oxymorphone IR Hydromorphone Hydrocodone/APAP Morphine  Where is the problem?  Manufacturing and supply level.  Will this shortage affect you?  Only if you take any of the above pain medications.  How? You may be unable to fill your prescription.  Your pharmacist may offer a "partial fill" of your prescription. (Warning: Do not accept partial fills.) Prescriptions partially filled cannot be transferred to another pharmacy. Read our Medication Rules and Regulation. Depending on how much medicine you are dependent on, you may experience withdrawals when unable to get the medication.  Recommendations: Consider ending your dependence on opioid pain medications. Ask your pain specialist to assist you with the process. Consider switching to a medication currently not in shortage, such as Buprenorphine. Talk to your pain specialist about this option. Consider decreasing your pain medication requirements by managing tolerance thru "Drug Holidays". This may help minimize withdrawals, should you run out of medicine. Control your pain thru the use of non-pharmacological interventional therapies.   Your prescriber: Prescribers cannot be blamed for shortages. Medication manufacturing and supply issues cannot be fixed by the prescriber.   NOTE: The prescriber is not responsible for supplying the medication, or solving supply issues. Work with your pharmacist to solve it. The patient is responsible for the decision to take or continue taking the medication and for identifying and securing a legal supply source. By law, supplying the medication is the job and responsibility of the pharmacy. The prescriber is responsible for the evaluation, monitoring, and prescribing of these medications.   Prescribers will NOT: Re-issue prescriptions that have been  partially filled. Re-issue prescriptions already sent to a pharmacy.  Re-send prescriptions to a different pharmacy because yours did not have your medication. Ask pharmacist to order more medicine or transfer the prescription to another pharmacy. (Read below.)  New 2023 regulation: "March 06, 2022 Revised Regulation Allows DEA-Registered Pharmacies to Transfer Electronic Prescriptions at a Patient's Request [place] Patients now have the ability to request their electronic prescription be transferred to another pharmacy without having to go back to their practitioner to initiate the request. This revised regulation went into effect on Monday, March 02, 2022.     At a patient's request, a DEA-registered retail pharmacy can now transfer an electronic prescription for a controlled substance (schedules II-V) to another DEA-registered retail pharmacy. Prior to this change, patients would have to go through their practitioner to cancel their prescription and have it re-issued to a different pharmacy. The process was taxing and time consuming for both patients and practitioners.  The Drug Enforcement Administration [facility]) published its intent to revise the process for transferring electronic prescriptions on May 24, 2020.  The final rule was published in the federal register on January 29, 2022 and went into effect 30 days later.  Under the final rule, a prescription can only be transferred once between pharmacies, and only if allowed under existing state or other applicable law. The prescription must remain in its electronic form; may not be altered in any way; and the transfer must be communicated directly between two licensed pharmacists. It's important to note, any authorized refills transfer with the original prescription, which means the entire prescription will be filled at the same pharmacy".   Reference: [email or web address] [facility] website announcement)  [email or web address] ([place])   [employer] / Vol. 88, No. 143 / Thursday, January 29, 2022 / Rules and Regulations DEPARTMENT OF JUSTICE  Drug Enforcement Administration  21 CFR Part 1306  [Docket No. [id number]]  RIN [id number] Transfer of Electronic Prescriptions for Schedules II-V Controlled Substances Between Pharmacies for Initial Filling  ____________________________________________________________________________________________     _______________________________________________________________________  Medication Rules  Purpose: To inform patients, and their family members, of our medication rules and regulations.  Applies to: All patients receiving prescriptions from our practice (written or electronic).  Pharmacy of record: This is the pharmacy where your electronic prescriptions will be sent. Make sure we have the correct one.  Electronic prescriptions: In compliance with the [place] (STOP) Act of 2017 (Session Lanny Cramp [phone]), effective July 06, 2018, all controlled substances must be electronically prescribed. Written prescriptions, faxing, or calling prescriptions to a pharmacy will no longer be done.  Prescription refills: These will be provided only during in-person appointments. No medications will be renewed without a "face-to-face" evaluation with your provider. Applies to all prescriptions.  NOTE: The following applies primarily to controlled substances (Opioid* Pain Medications).   Type of encounter (visit): For patients receiving controlled substances, face-to-face visits are required. (Not an option and not up to the patient.)  Patient's responsibilities: Pain Pills: Bring all  pain pills to every appointment (except for procedure appointments). Pill Bottles: Bring pills in original pharmacy bottle. Bring bottle, even if empty. Always bring the bottle of the most recent fill.  Medication refills: You are responsible for knowing and keeping track of what medications you are taking and when is it that you will need a refill. The day before your appointment: write a list of all prescriptions that need to be refilled. The day of the appointment: give the list to the admitting nurse. Prescriptions will be written only during appointments. No prescriptions will be written on procedure days. If you forget a medication: it will not be "Called in", "Faxed", or "electronically sent". You will need to get another appointment to get these prescribed. No early refills. Do not call asking to have your prescription filled early. Partial  or short prescriptions: Occasionally your pharmacy may not have enough pills to fill your prescription.  NEVER ACCEPT a partial fill or a prescription that is short of the total amount of pills that you were prescribed.  With controlled substances the law allows 72 hours for the pharmacy to complete the prescription.  If the prescription is not completed within 72 hours, the pharmacist will require a new prescription to be written. This means that you will be short on your medicine and we WILL NOT send another prescription to complete your original prescription.  Instead, request the pharmacy  to send a carrier to a nearby branch to get enough medication to provide you with your full prescription. Prescription Accuracy: You are responsible for carefully inspecting your prescriptions before leaving our office. Have the discharge nurse carefully go over each prescription with you, before taking them home. Make sure that your name is accurately spelled, that your address is correct. Check the name and dose of your medication to make sure it is accurate. Check the  number of pills, and the written instructions to make sure they are clear and accurate. Make sure that you are given enough medication to last until your next medication refill appointment. Taking Medication: Take medication as prescribed. When it comes to controlled substances, taking less pills or less frequently than prescribed is permitted and encouraged. Never take more pills than instructed. Never take the medication more frequently than prescribed.  Inform other Doctors: Always inform, all of your healthcare providers, of all the medications you take. Pain Medication from other Providers: You are not allowed to accept any additional pain medication from any other Doctor or Healthcare provider. There are two exceptions to this rule. (see below) In the event that you require additional pain medication, you are responsible for notifying us, as stated below. Cough Medicine: Often these contain an opioid, such as codeine or hydrocodone. Never accept or take cough medicine containing these opioids if you are already taking an opioid* medication. The combination may cause respiratory failure and death. Medication Agreement: You are responsible for carefully reading and following our Medication Agreement. This must be signed before receiving any prescriptions from our practice. Safely store a copy of your signed Agreement. Violations to the Agreement will result in no further prescriptions. (Additional copies of our Medication Agreement are available upon request.) Laws, Rules, & Regulations: All patients are expected to follow all Federal and [employer], [employer], Rules, [employer]. Ignorance of the Laws does not constitute a valid excuse.  Illegal drugs and Controlled Substances: The use of illegal substances (including, but not limited to marijuana and its derivatives) and/or the illegal use of any controlled substances is strictly prohibited. Violation of this rule may result in the immediate and  permanent discontinuation of any and all prescriptions being written by our practice. The use of any illegal substances is prohibited. Adopted CDC guidelines & recommendations: Target dosing levels will be at or below 60 MME/day. Use of benzodiazepines** is not recommended.  Exceptions: There are only two exceptions to the rule of not receiving pain medications from other Healthcare Providers. Exception #1 (Emergencies): In the event of an emergency (i.e.: accident requiring emergency care), you are allowed to receive additional pain medication. However, you are responsible for: As soon as you are able, call our office [phone], at any time of the day or night, and leave a message stating your name, the date and nature of the emergency, and the name and dose of the medication prescribed. In the event that your call is answered by a member of our staff, make sure to document and save the date, time, and the name of the person that took your information.  Exception #2 (Planned Surgery): In the event that you are scheduled by another doctor or dentist to have any type of surgery or procedure, you are allowed (for a period no longer than 30 days), to receive additional pain medication, for the acute post-op pain. However, in this case, you are responsible for picking up a copy of our "Post-op Pain Management for Surgeons"  handout, and giving it to your surgeon or dentist. This document is available at our office, and does not require an appointment to obtain it. Simply go to our office during business hours (Monday-Thursday from 8:00 AM to 4:00 PM) (Friday 8:00 AM to 12:00 Noon) or if you have a scheduled appointment with [place], prior to your surgery, and ask for it by name. In addition, you are responsible for: calling our office [phone], at any time of the day or night, and leaving a message stating your name, name of your surgeon, type of surgery, and date of procedure or surgery. Failure to comply  with your responsibilities may result in termination of therapy involving the controlled substances. Medication Agreement Violation. Following the above rules, including your responsibilities will help you in avoiding a Medication Agreement Violation ("Breaking your Pain Medication Contract").  Consequences:  Not following the above rules may result in permanent discontinuation of medication prescription therapy.  *Opioid medications include: morphine, codeine, oxycodone, oxymorphone, hydrocodone, hydromorphone, meperidine, tramadol, tapentadol, buprenorphine, fentanyl, methadone. **Benzodiazepine medications include: diazepam (Valium), alprazolam (Xanax), clonazepam (Klonopine), lorazepam (Ativan), clorazepate (Tranxene), chlordiazepoxide (Librium), estazolam (Prosom), oxazepam (Serax), temazepam (Restoril), triazolam (Halcion) (Last updated: 04/28/2022) ______________________________________________________________________    ______________________________________________________________________  Medication Recommendations and Reminders  Applies to: All patients receiving prescriptions (written and/or electronic).  Medication Rules & Regulations: You are responsible for reading, knowing, and following our "Medication Rules" document. These exist for your safety and that of others. They are not flexible and neither are we. Dismissing or ignoring them is an act of "non-compliance" that may result in complete and irreversible termination of such medication therapy. For safety reasons, "non-compliance" will not be tolerated. As with the [place] fundamental legal principle of "ignorance of the law is no defense", we will accept no excuses for not having read and knowing the content of documents provided to you by our practice.  Pharmacy of record:  Definition: This is the pharmacy where your electronic prescriptions will be sent.  We do not endorse any particular pharmacy. It is up to you and your  insurance to decide what pharmacy to use.  We do not restrict you in your choice of pharmacy. However, once we write for your prescriptions, we will NOT be re-sending more prescriptions to fix restricted supply problems created by your pharmacy, or your insurance.  The pharmacy listed in the electronic medical record should be the one where you want electronic prescriptions to be sent. If you choose to change pharmacy, simply notify our nursing staff. Changes will be made only during your regular appointments and not over the phone.  Recommendations: Keep all of your pain medications in a safe place, under lock and key, even if you live alone. We will NOT replace lost, stolen, or damaged medication. We do not accept "Police Reports" as proof of medications having been stolen. After you fill your prescription, take 1 week's worth of pills and put them away in a safe place. You should keep a separate, properly labeled bottle for this purpose. The remainder should be kept in the original bottle. Use this as your primary supply, until it runs out. Once it's gone, then you know that you have 1 week's worth of medicine, and it is time to come in for a prescription refill. If you do this correctly, it is unlikely that you will ever run out of medicine. To make sure that the above recommendation works, it is very important that you make sure your medication refill appointments are  scheduled at least 1 week before you run out of medicine. To do this in an effective manner, make sure that you do not leave the office without scheduling your next medication management appointment. Always ask the nursing staff to show you in your prescription , when your medication will be running out. Then arrange for the receptionist to get you a return appointment, at least 7 days before you run out of medicine. Do not wait until you have 1 or 2 pills left, to come in. This is very poor planning and does not take into consideration  that we may need to cancel appointments due to bad weather, sickness, or emergencies affecting our staff. DO NOT ACCEPT A "Partial Fill": If for any reason your pharmacy does not have enough pills/tablets to completely fill or refill your prescription, do not allow for a "partial fill". The law allows the pharmacy to complete that prescription within 72 hours, without requiring a new prescription. If they do not fill the rest of your prescription within those 72 hours, you will need a separate prescription to fill the remaining amount, which we will NOT provide. If the reason for the partial fill is your insurance, you will need to talk to the pharmacist about payment alternatives for the remaining tablets, but again, DO NOT ACCEPT A PARTIAL FILL, unless you can trust your pharmacist to obtain the remainder of the pills within 72 hours.  Prescription refills and/or changes in medication(s):  Prescription refills, and/or changes in dose or medication, will be conducted only during scheduled medication management appointments. (Applies to both, written and electronic prescriptions.) No refills on procedure days. No medication will be changed or started on procedure days. No changes, adjustments, and/or refills will be conducted on a procedure day. Doing so will interfere with the diagnostic portion of the procedure. No phone refills. No medications will be "called into the pharmacy". No Fax refills. No weekend refills. No Holliday refills. No after hours refills.  Remember:  Business hours are:  Monday to Thursday 8:00 AM to 4:00 PM Provider's Schedule: Milinda Pointer, MD - Appointments are:  Medication management: Monday and Wednesday 8:00 AM to 4:00 PM Procedure day: Tuesday and Thursday 7:30 AM to 4:00 PM Gillis Santa, MD - Appointments are:  Medication management: Tuesday and Thursday 8:00 AM to 4:00 PM Procedure day: Monday and Wednesday 7:30 AM to 4:00 PM (Last update:  04/28/2022) ______________________________________________________________________    ____________________________________________________________________________________________  Drug Holidays  What is a "Drug Holiday"? Drug Holiday: is the name given to the process of slowly tapering down and temporarily stopping the pain medication for the purpose of decreasing or eliminating tolerance to the drug.  Benefits Improved effectiveness Decreased required effective dose Improved pain control End dependence on high dose therapy Decrease cost of therapy Uncovering "opioid-induced hyperalgesia". (OIH)  What is "opioid hyperalgesia"? It is a paradoxical increase in pain caused by exposure to opioids. Stopping the opioid pain medication, contrary to the expected, it actually decreases or completely eliminates the pain. Ref.: "A comprehensive review of opioid-induced hyperalgesia". Brion Aliment, et.al. Pain Physician. 2011 Mar-Apr;14(2):145-61.  What is tolerance? Tolerance: the progressive loss of effectiveness of a pain medicine due to repetitive use. A common problem of opioid pain medications.  How long should a "Drug Holiday" last? Effectiveness depends on the patient staying off all opioid pain medicines for a minimum of 14 consecutive days. (2 weeks)  How about just taking less of the medicine? Does not work. Will not accomplish goal of  eliminating the excess receptors.  How about switching to a different pain medicine? (AKA. "Opioid rotation") Does not work. Creates the illusion of effectiveness by taking advantage of inaccurate equivalent dose calculations between different opioids. -This "technique" was promoted by studies funded by [employer], such as [employer], creators of "OxyContin".  Can I stop the medicine "cold [place]"? Depends. You should always coordinate with your Pain Specialist to make the transition as smoothly as possible. Avoid stopping the  medicine abruptly without consulting. We recommend a "slow taper".  What is a slow taper? Taper: refers to the gradual decrease in dose.   How do I stop/taper the dose? Slowly. Decrease the daily amount of pills that you take by one (1) pill every seven (7) days. This is called a "slow downward taper". Example: if you normally take four (4) pills per day, drop it to three (3) pills per day for seven (7) days, then to two (2) pills per day for seven (7) days, then to one (1) per day for seven (7) days, and then stop the medicine. The 14 day "Drug Holiday" starts on the first day without medicine.   Will I experience withdrawals? Unlikely with a slow taper.  What triggers withdrawals? Withdrawals are triggered by the sudden/abrupt stop of high dose opioids. Withdrawals can be avoided by slowly decreasing the dose over a prolonged period of time.  What are withdrawals? Symptoms associated with sudden/abrupt reduction/stopping of high-dose, long-term use of pain medication. Withdrawal are seldom seen on low dose therapy, or patients rarely taking opioid medication.  Early Withdrawal Symptoms may include: Agitation Anxiety Muscle aches Increased tearing Insomnia Runny nose Sweating Yawning  Late symptoms may include: Abdominal cramping Diarrhea Dilated pupils Goose bumps Nausea Vomiting  (Last update: 06/14/2022) ____________________________________________________________________________________________    ____________________________________________________________________________________________  WARNING: CBD (cannabidiol) & Delta (Delta-8 tetrahydrocannabinol) products.   Applicable to:  All individuals currently taking or considering taking CBD (cannabidiol) and, more important, all patients taking opioid analgesic controlled substances (pain medication). (Example: oxycodone; oxymorphone; hydrocodone; hydromorphone; morphine; methadone; tramadol; tapentadol; fentanyl;  buprenorphine; butorphanol; dextromethorphan; meperidine; codeine; etc.)  Introduction:  Recently there has been a drive towards the use of "natural" products for the treatment of different conditions, including pain anxiety and sleep disorders. Marijuana and hemp are two varieties of the cannabis genus plants. Marijuana and its derivatives are illegal, while hemp and its derivatives are not. Cannabidiol (CBD) and tetrahydrocannabinol (THC), are two natural compounds found in plants of the Cannabis genus. They can both be extracted from hemp or marijuana. Both compounds interact with your body's endocannabinoid system in very different ways. CBD is associated with pain relief (analgesia) while THC is associated with the psychoactive effects ("the high") obtained from the use of marijuana products. There are two main types of THC: Delta-9, which comes from the marijuana plant and it is illegal, and Delta-8, which comes from the hemp plant, and it is legal. (Both, Delta-9-THC and Delta-8-THC are psychoactive and give you "the high".)   Legality:  Marijuana and its derivatives: illegal Hemp and its derivatives: Legal (State dependent) UPDATE: (08/22/2021) The Drug Enforcement Agency ([place]) issued a letter stating that "delta" cannabinoids, including Delta-8-THCO and Delta-9-THCO, synthetically derived from hemp do not qualify as hemp and will be viewed as Schedule I drugs. (Schedule I drugs, substances, or chemicals are defined as drugs with no currently accepted medical use and a high potential for abuse. Some examples of Schedule I drugs are: heroin, lysergic acid diethylamide (LSD), marijuana (cannabis),  3,4-methylenedioxymethamphetamine (ecstasy), methaqualone, and peyote.) (https://jennings.com/)  Legal status of CBD in [place]:  "Conditionally Legal"  Reference: "FDA Regulation of Cannabis and Cannabis-Derived Products, Including Cannabidiol (CBD)" -  SeekArtists.com.pt  Warning:  CBD is not FDA approved and has not undergo the same manufacturing controls as prescription drugs.  This means that the purity and safety of available CBD may be questionable. Most of the time, despite manufacturer's claims, it is contaminated with THC (delta-9-tetrahydrocannabinol - the chemical in marijuana responsible for the "HIGH").  When this is the case, the [facility] contaminant will trigger a positive urine drug screen (UDS) test for Marijuana (carboxy-THC).   The FDA recently put out a warning about 5 things that everyone should be aware of regarding Delta-8 THC: Delta-8 THC products have not been evaluated or approved by the FDA for safe use and may be marketed in ways that put the public health at risk. The FDA has received adverse event reports involving delta-8 THC-containing products. Delta-8 THC has psychoactive and intoxicating effects. Delta-8 THC manufacturing often involve use of potentially harmful chemicals to create the concentrations of delta-8 THC claimed in the marketplace. The final delta-8 THC product may have potentially harmful by-products (contaminants) due to the chemicals used in the process. Manufacturing of delta-8 THC products may occur in uncontrolled or unsanitary settings, which may lead to the presence of unsafe contaminants or other potentially harmful substances. Delta-8 THC products should be kept out of the reach of children and pets.  NOTE: Because a positive UDS for any illicit substance is a violation of our medication agreement, your opioid analgesics (pain medicine) may be permanently discontinued.  MORE ABOUT CBD  General Information: CBD was discovered in [birth year] and it is a derivative of the cannabis sativa genus plants (Marijuana and Hemp). It is one of the 113 identified substances found in Marijuana. It accounts  for up to 40% of the plant's extract. As of 2018, preliminary clinical studies on CBD included research for the treatment of anxiety, movement disorders, and pain. CBD is available and consumed in multiple forms, including inhalation of smoke or vapor, as an aerosol spray, and by mouth. It may be supplied as an oil containing CBD, capsules, dried cannabis, or as a liquid solution. CBD is thought not to be as psychoactive as THC (delta-9-tetrahydrocannabinol - the chemical in marijuana responsible for the "HIGH"). Studies suggest that CBD may interact with different biological target receptors in the body, including cannabinoid and other neurotransmitter receptors. As of 2018 the mechanism of action for its biological effects has not been determined.  Side-effects  Adverse reactions: Dry mouth, diarrhea, decreased appetite, fatigue, drowsiness, malaise, weakness, sleep disturbances, and others.  Drug interactions:  CBD may interact with medications such as blood-thinners. CBD causes drowsiness on its own and it will increase drowsiness caused by other medications, including antihistamines (such as Benadryl), benzodiazepines (Xanax, Ativan, Valium), antipsychotics, antidepressants, opioids, alcohol and supplements such as kava, melatonin and St. John's Wort.  Other drug interactions: Brivaracetam (Briviact); Caffeine; Carbamazepine (Tegretol); Citalopram (Celexa); Clobazam (Onfi); Eslicarbazepine (Aptiom); Everolimus (Zostress); Lithium; Methadone (Dolophine); Rufinamide (Banzel); Sedative medications (CNS depressants); Sirolimus (Rapamune); Stiripentol (Diacomit); Tacrolimus (Prograf); Tamoxifen ; Soltamox); Topiramate (Topamax); Valproate; Warfarin (Coumadin); Zonisamide. (Last update: 06/15/2022) ____________________________________________________________________________________________   ____________________________________________________________________________________________  Naloxone Nasal  Spray  Why am I receiving this medication? [place] STOP ACT requires that all patients taking high dose opioids or at risk of opioids respiratory depression, be prescribed an opioid reversal agent, such as Naloxone (AKA: Narcan).  What  is this medication? NALOXONE (nal OX one) treats opioid overdose, which causes slow or shallow breathing, severe drowsiness, or trouble staying awake. Call emergency services after using this medication. You may need additional treatment. Naloxone works by reversing the effects of opioids. It belongs to a group of medications called opioid blockers.  COMMON BRAND NAME(S): Kloxxado, Narcan  What should I tell my care team before I take this medication? They need to know if you have any of these conditions: Heart disease Substance use disorder An unusual or allergic reaction to naloxone, other medications, foods, dyes, or preservatives Pregnant or trying to get pregnant Breast-feeding  When to use this medication? This medication is to be used for the treatment of respiratory depression (less than 8 breaths per minute) secondary to opioid overdose.   How to use this medication? This medication is for use in the nose. Lay the person on their back. Support their neck with your hand and allow the head to tilt back before giving the medication. The nasal spray should be given into 1 nostril. After giving the medication, move the person onto their side. Do not remove or test the nasal spray until ready to use. Get emergency medical help right away after giving the first dose of this medication, even if the person wakes up. You should be familiar with how to recognize the signs and symptoms of a narcotic overdose. If more doses are needed, give the additional dose in the other nostril. Talk to your care team about the use of this medication in children. While this medication may be prescribed for children as young as newborns for selected conditions, precautions  do apply.  Naloxone Overdosage: If you think you have taken too much of this medicine contact a poison control center or emergency room at once.  NOTE: This medicine is only for you. Do not share this medicine with others.  What if I miss a dose? This does not apply.  What may interact with this medication? This is only used during an emergency. No interactions are expected during emergency use. This list may not describe all possible interactions. Give your health care provider a list of all the medicines, herbs, non-prescription drugs, or dietary supplements you use. Also tell them if you smoke, drink alcohol, or use illegal drugs. Some items may interact with your medicine.  What should I watch for while using this medication? Keep this medication ready for use in the case of an opioid overdose. Make sure that you have the phone number of your care team and local hospital ready. You may need to have additional doses of this medication. Each nasal spray contains a single dose. Some emergencies may require additional doses. After use, bring the treated person to the nearest hospital or call 911. Make sure the treating care team knows that the person has received a dose of this medication. You will receive additional instructions on what to do during and after use of this medication before an emergency occurs.  What side effects may I notice from receiving this medication? Side effects that you should report to your care team as soon as possible: Allergic reactions--skin rash, itching, hives, swelling of the face, lips, tongue, or throat Side effects that usually do not require medical attention (report these to your care team if they continue or are bothersome): Constipation Dryness or irritation inside the nose Headache Increase in blood pressure Muscle spasms Stuffy nose Toothache This list may not describe all possible side effects. Call  your doctor for medical advice about side  effects. You may report side effects to FDA at 1-800-FDA-1088.  Where should I keep my medication? Because this is an emergency medication, you should keep it with you at all times.  Keep out of the reach of children and pets. Store between 20 and 25 degrees C (68 and 77 degrees F). Do not freeze. Throw away any unused medication after the expiration date. Keep in original box until ready to use.  NOTE: This sheet is a summary. It may not cover all possible information. If you have questions about this medicine, talk to your doctor, pharmacist, or health care provider.   2023 Elsevier/Gold Standard (2021-02-28 00:00:00)  ____________________________________________________________________________________________   ____________________________________________________________________________________________  Patient Information update  To: All of our patients.  Re: Name change.  It has been made official that our current name, "[facility]"   will soon be changed to "[facility]".   The purpose of this change is to eliminate any confusion created by the concept of our practice being a "Medication Management Pain Clinic". In the past this has led to the misconception that we treat pain primarily by the use of prescription medications.  Nothing can be farther from the truth.   Understanding PAIN MANAGEMENT: To further understand what our practice does, you first have to understand that "Pain Management" is a subspecialty that requires additional training once a physician has completed their specialty training, which can be in either Anesthesia, Neurology, Psychiatry, or Physical Medicine and Rehabilitation (PMR). Each one of these contributes to the final approach taken by each physician to the management of their patient's pain. To be a "Pain Management Specialist" you must have first  completed one of the specialty trainings below.  Anesthesiologists - trained in clinical pharmacology and interventional techniques such as nerve blockade and regional as well as central neuroanatomy. They are trained to block pain before, during, and after surgical interventions.  Neurologists - trained in the diagnosis and pharmacological treatment of complex neurological conditions, such as Multiple Sclerosis, Parkinson's, spinal cord injuries, and other systemic conditions that may be associated with symptoms that may include but are not limited to pain. They tend to rely primarily on the treatment of chronic pain using prescription medications.  Psychiatrist - trained in conditions affecting the psychosocial wellbeing of patients including but not limited to depression, anxiety, schizophrenia, personality disorders, addiction, and other substance use disorders that may be associated with chronic pain. They tend to rely primarily on the treatment of chronic pain using prescription medications.   Physical Medicine and Rehabilitation (PMR) physicians, also known as physiatrists - trained to treat a wide variety of medical conditions affecting the brain, spinal cord, nerves, bones, joints, ligaments, muscles, and tendons. Their training is primarily aimed at treating patients that have suffered injuries that have caused severe physical impairment. Their training is primarily aimed at the physical therapy and rehabilitation of those patients. They may also work alongside orthopedic surgeons or neurosurgeons using their expertise in assisting surgical patients to recover after their surgeries.  INTERVENTIONAL PAIN MANAGEMENT is sub-subspecialty of Pain Management.  Our physicians are Board-certified in Anesthesia, Pain Management, and Interventional Pain Management.  This meaning that not only have they been trained and Board-certified in their specialty of Anesthesia, and subspecialty of Pain Management,  but they have also received further training in the sub-subspecialty of Interventional Pain Management, in order to become Board-certified as  INTERVENTIONAL PAIN MANAGEMENT SPECIALIST.    Mission: Our goal is to use our skills in  [place] as alternatives to the chronic use of prescription opioid medications for the treatment of pain. To make this more clear, we have changed our name to reflect what we do and offer. We will continue to offer medication management assessment and recommendations, but we will not be taking over any patient's medication management.  ____________________________________________________________________________________________

## 2022-08-05 NOTE — Progress Notes (Signed)
Nursing Pain Medication Assessment:  Safety precautions to be maintained throughout the outpatient stay will include: orient to surroundings, keep bed in low position, maintain call bell within reach at all times, provide assistance with transfer out of bed and ambulation.  Medication Inspection Compliance: Pill count conducted under aseptic conditions, in front of the patient. Neither the pills nor the bottle was removed from the patient's sight at any time. Once count was completed pills were immediately returned to the patient in their original bottle.  Medication: Oxycodone IR Pill/Patch Count:  38 of 120 pills remain Pill/Patch Appearance: Markings consistent with prescribed medication Bottle Appearance: Standard pharmacy container. Clearly labeled. Filled Date: 01 / 10 / 2024 Last Medication intake:  Today

## 2022-09-09 ENCOUNTER — Other Ambulatory Visit (HOSPITAL_BASED_OUTPATIENT_CLINIC_OR_DEPARTMENT_OTHER): Payer: BC Managed Care – PPO | Admitting: Pain Medicine

## 2022-09-09 ENCOUNTER — Telehealth: Payer: Self-pay | Admitting: Pain Medicine

## 2022-09-09 DIAGNOSIS — M961 Postlaminectomy syndrome, not elsewhere classified: Secondary | ICD-10-CM

## 2022-09-09 DIAGNOSIS — G8929 Other chronic pain: Secondary | ICD-10-CM

## 2022-09-09 DIAGNOSIS — M79605 Pain in left leg: Secondary | ICD-10-CM

## 2022-09-09 DIAGNOSIS — M5442 Lumbago with sciatica, left side: Secondary | ICD-10-CM

## 2022-09-09 DIAGNOSIS — G96198 Other disorders of meninges, not elsewhere classified: Secondary | ICD-10-CM

## 2022-09-09 DIAGNOSIS — M5417 Radiculopathy, lumbosacral region: Secondary | ICD-10-CM

## 2022-09-09 DIAGNOSIS — M5441 Lumbago with sciatica, right side: Secondary | ICD-10-CM

## 2022-09-09 DIAGNOSIS — M79604 Pain in right leg: Secondary | ICD-10-CM

## 2022-09-09 DIAGNOSIS — M5137 Other intervertebral disc degeneration, lumbosacral region: Secondary | ICD-10-CM

## 2022-09-09 NOTE — Telephone Encounter (Signed)
PT stated the doctor told him at the last appt that could call ahead to get the order put in for Racz.

## 2022-09-09 NOTE — Telephone Encounter (Signed)
Derek [name] scheduled for 10/01/22 at 0800

## 2022-09-09 NOTE — Telephone Encounter (Signed)
I don't see anything about this in the notes. What do you wish to do?

## 2022-09-09 NOTE — Telephone Encounter (Signed)
Yes,  ECT. I don't know in what order.

## 2022-09-09 NOTE — Telephone Encounter (Signed)
Yes, ECT. I don't know in what oYes, ECT. I don't know in what order. rder.

## 2022-09-09 NOTE — Progress Notes (Signed)
Patient called indicating that his pain has reached a level where he thinks he needs to have the left Racz procedure done again.  His last treatment was on 12/18/2021 and where reaching the 9 months mark.  We had previously talked about this and he was told to let us know when the pain had reached the level where he thought they needed to have it repeated.  He has recently called to let us know.  The order has been entered today.

## 2022-09-30 NOTE — Patient Instructions (Incomplete)
____________________________________________________________________________________________  Virtual Visits   ID our calls: Add these numbers to your list of contacts on your smart phone. Label it as "PAIN Management" Nursing: [phone] (Main) Dr. Peggi Yono: [phone]   What is a "Virtual Visit"? It is an electronic healthcare encounter (medical visit) that takes place on real time (NOT TEXT or E-MAIL) over the telephone or computer device (desktop, laptop, tablet, smart phone, etc.). It allows for more communication flexibility between the patient and the healthcare provider.  Who decides when these types of visits will be used? The physician.  Who is eligible for these types of visits? Only those patients that can be reliably reached over the telephone.  What do you mean by reliably? We do not have time to call everyone multiple times, therefore those patients that tend to screen calls and then call back later are not suitable candidates for this system. We all hate telemarketers and "Robocalls".  We understand how people are reluctant to pickup on calls from "unknown numbers", therefore, we suggest you add our numbers to your list of contacts. This way, you should be able to identify our calls. All of our numbers are available above.   Who is not eligible? This option is not appropriate for medication management. Patients on controlled substances have to come in for "Face-to-Face" encounters. Monitoring of these substances is mandatory. Virtual visits do not allow for unannounced drug screening tests or pill counts. Not bringing your pills, or the empty bottles, may result in no refill.  When will this type of visits be used? For follow-up after procedures on established patients, as long as they have had the same procedure done before.  Whenever you are physically unable to attend a regular appointment.   Can I request my medication visit to be "Virtual"? Yes. Available on  a limited basis, only if you are unable to physically attend your appointment. However, you may only receive a 30-day prescription. Abuse of this option may result in discontinuation of medication due to inability to properly monitor the therapy.  When will I be called?  You will receive an initial call from [phone], from our nursing staff, one business day prior to your appointment. (For Monday appointments you will be called on Friday.) The purpose of this call is to review your medications and the results of any recent procedure.  If the nursing staff is unable to make contact, your virtual encounter may be canceled and rescheduled to a face-to-face visit.  Your provider will call you on the day of your appointment.  At what time will I be called? Providers will call whenever there is time available. Do not expect calls at any specific time. On the schedule, you will have an appointment time assigned to you however, this is seldom accurate. This is done simply to keep a list of patients to be called. Be advised that calls may come at anytime during the day. Calls start as early as 8:00 AM and go as late as 8:00 PM. This will depend on provider availability. The system is not perfect. If this is inconvenient for you, please request to be changed to an "in-person" appointment.   ____________________________________________________________________________________________    ____________________________________________________________________________________________  Post-Procedure Discharge Instructions  Instructions: Apply ice:  Purpose: This will minimize any swelling and discomfort after procedure.  When: Day of procedure, as soon as you get home. How: Fill a plastic sandwich bag with crushed ice. Cover it with a small towel and apply to injection   site. How long: (15 min on, 15 min off) Apply for 15 minutes then remove x 15 minutes.  Repeat sequence on day of procedure, until you go to  bed. Apply heat:  Purpose: To treat any soreness and discomfort from the procedure. When: Starting the next day after the procedure. How: Apply heat to procedure site starting the day following the procedure. How long: May continue to repeat daily, until discomfort goes away. Food intake: Start with clear liquids (like water) and advance to regular food, as tolerated.  Physical activities: Keep activities to a minimum for the first 8 hours after the procedure. After that, then as tolerated. Driving: If you have received any sedation, be responsible and do not drive. You are not allowed to drive for 24 hours after having sedation. Blood thinner: (Applies only to those taking blood thinners) You may restart your blood thinner 6 hours after your procedure. Insulin: (Applies only to Diabetic patients taking insulin) As soon as you can eat, you may resume your normal dosing schedule. Infection prevention: Keep procedure site clean and dry. Shower daily and clean area with soap and water. Post-procedure Pain Diary: Extremely important that this be done correctly and accurately. Recorded information will be used to determine the next step in treatment. For the purpose of accuracy, follow these rules: Evaluate only the area treated. Do not report or include pain from an untreated area. For the purpose of this evaluation, ignore all other areas of pain, except for the treated area. After your procedure, avoid taking a long nap and attempting to complete the pain diary after you wake up. Instead, set your alarm clock to go off every hour, on the hour, for the initial 8 hours after the procedure. Document the duration of the numbing medicine, and the relief you are getting from it. Do not go to sleep and attempt to complete it later. It will not be accurate. If you received sedation, it is likely that you were given a medication that may cause amnesia. Because of this, completing the diary at a later time may  cause the information to be inaccurate. This information is needed to plan your care. Follow-up appointment: Keep your post-procedure follow-up evaluation appointment after the procedure (usually 2 weeks for most procedures, 6 weeks for radiofrequencies). DO NOT FORGET to bring you pain diary with you.   Expect: (What should I expect to see with my procedure?) From numbing medicine (AKA: Local Anesthetics): Numbness or decrease in pain. You may also experience some weakness, which if present, could last for the duration of the local anesthetic. Onset: Full effect within 15 minutes of injected. Duration: It will depend on the type of local anesthetic used. On the average, 1 to 8 hours.  From steroids (Applies only if steroids were used): Decrease in swelling or inflammation. Once inflammation is improved, relief of the pain will follow. Onset of benefits: Depends on the amount of swelling present. The more swelling, the longer it will take for the benefits to be seen. In some cases, up to 10 days. Duration: Steroids will stay in the system x 2 weeks. Duration of benefits will depend on multiple posibilities including persistent irritating factors. Side-effects: If present, they may typically last 2 weeks (the duration of the steroids). Frequent: Cramps (if they occur, drink Gatorade and take over-the-counter Magnesium 450-500 mg once to twice a day); water retention with temporary weight gain; increases in blood sugar; decreased immune system response; increased appetite. Occasional: Facial flushing (red, warm   cheeks); mood swings; menstrual changes. Uncommon: Long-term decrease or suppression of natural hormones; bone thinning. (These are more common with higher doses or more frequent use. This is why we prefer that our patients avoid having any injection therapies in other practices.)  Very Rare: Severe mood changes; psychosis; aseptic necrosis. From procedure: Some discomfort is to be expected once  the numbing medicine wears off. This should be minimal if ice and heat are applied as instructed.  Call if: (When should I call?) You experience numbness and weakness that gets worse with time, as opposed to wearing off. New onset bowel or bladder incontinence. (Applies only to procedures done in the spine)  Emergency Numbers: Durning business hours (Monday - Thursday, 8:00 AM - 4:00 PM) (Friday, 9:00 AM - 12:00 Noon): [phone] After hours: [phone] NOTE: If you are having a problem and are unable connect with, or to talk to a provider, then go to your nearest urgent care or emergency department. If the problem is serious and urgent, please call 911. ____________________________________________________________________________________________    

## 2022-09-30 NOTE — Progress Notes (Unsigned)
PROVIDER NOTE: Interpretation of information contained herein should be left to medically-trained personnel. Specific patient instructions are provided elsewhere under "Patient Instructions" section of medical record. This document was created in part using STT-dictation technology, any transcriptional errors that may result from this process are unintentional.  Patient: Derek [name] Type: Established DOB: 07-May-1981 MRN: [id number] PCP: Donnamarie Rossetti, PA-C  Service: Procedure DOS: 10/01/2022 Setting: Ambulatory Location: Ambulatory outpatient facility Delivery: Face-to-face Provider: Gaspar Cola, MD Specialty: Interventional Pain Management Specialty designation: 09 Location: Outpatient facility Ref. Prov.: Venetia Maxon, Rolanda Jay,*       Interventional Therapy   Primary Reason for Visit: Interventional Pain Management Treatment. CC: No chief complaint on file.    Procedure:          Anesthesia, Analgesia, Anxiolysis:  Type: Therapeutic Percutaneous Epidural Neuroplasty and Lysis of Adhesions (RACZ Procedure)           Region: Caudal Level: Sacrococcygeal   Laterality: Midline         Anesthesia: Local (1-2% Lidocaine)  Anxiolysis: None  Sedation: None  Guidance: Fluoroscopy           Position: Prone   No diagnosis found. NAS-11 Pain score:   Pre-procedure:  /10   Post-procedure:  /10      Pre-op H&P Assessment:  Mr. Gleave is a 42 y.o. (year old), male patient, seen today for interventional treatment. He  has a past surgical history that includes Back surgery; HAND REIMPLANTED; and Lumbar fusion (11/14). Mr. Miro has a current medication list which includes the following prescription(s): calcium carb-cholecalciferol, celecoxib, cyclobenzaprine, diclofenac sodium, esomeprazole, meloxicam, naloxone, oxycodone hcl, oxycodone hcl, [START ON 10/13/2022] oxycodone hcl, prednisolone acetate, and tizanidine. His primarily concern today is the No chief complaint on  file.  Initial Vital Signs:  Pulse/HCG Rate:    Temp:   Resp:   BP:   SpO2:    BMI: Estimated body mass index is 24.97 kg/m as calculated from the following:   Height as of 08/05/22: 5\' 10"  (1.778 m).   Weight as of 08/05/22: 174 lb (78.9 kg).  Risk Assessment: Allergies: Reviewed. He is allergic to hydrocodone, amoxicillin, and vicodin [hydrocodone-acetaminophen].  Allergy Precautions: None required Coagulopathies: Reviewed. None identified.  Blood-thinner therapy: None at this time Active Infection(s): Reviewed. None identified. Mr. Kobak is afebrile  Site Confirmation: Mr. Saucerman was asked to confirm the procedure and laterality before marking the site Procedure checklist: Completed Consent: Before the procedure and under the influence of no sedative(s), amnesic(s), or anxiolytics, the patient was informed of the treatment options, risks and possible complications. To fulfill our ethical and legal obligations, as recommended by the American Medical Association's Code of Ethics, I have informed the patient of my clinical impression; the nature and purpose of the treatment or procedure; the risks, benefits, and possible complications of the intervention; the alternatives, including doing nothing; the risk(s) and benefit(s) of the alternative treatment(s) or procedure(s); and the risk(s) and benefit(s) of doing nothing. The patient was provided information about the general risks and possible complications associated with the procedure. These may include, but are not limited to: failure to achieve desired goals, infection, bleeding, organ or nerve damage, allergic reactions, paralysis, and death. In addition, the patient was informed of those risks and complications associated to Spine-related procedures, such as failure to decrease pain; infection (i.e.: Meningitis, epidural or intraspinal abscess); bleeding (i.e.: epidural hematoma, subarachnoid hemorrhage, or any other type of intraspinal or  peri-dural bleeding); organ or nerve damage (i.e.:  Any type of peripheral nerve, nerve root, or spinal cord injury) with subsequent damage to sensory, motor, and/or autonomic systems, resulting in permanent pain, numbness, and/or weakness of one or several areas of the body; allergic reactions; (i.e.: anaphylactic reaction); and/or death. Furthermore, the patient was informed of those risks and complications associated with the medications. These include, but are not limited to: allergic reactions (i.e.: anaphylactic or anaphylactoid reaction(s)); adrenal axis suppression; blood sugar elevation that in diabetics may result in ketoacidosis or comma; water retention that in patients with history of congestive heart failure may result in shortness of breath, pulmonary edema, and decompensation with resultant heart failure; weight gain; swelling or edema; medication-induced neural toxicity; particulate matter embolism and blood vessel occlusion with resultant organ, and/or nervous system infarction; and/or aseptic necrosis of one or more joints. Finally, the patient was informed that Medicine is not an exact science; therefore, there is also the possibility of unforeseen or unpredictable risks and/or possible complications that may result in a catastrophic outcome. The patient indicated having understood very clearly. We have given the patient no guarantees and we have made no promises. Enough time was given to the patient to ask questions, all of which were answered to the patient's satisfaction. Mr. Zehrung has indicated that he wanted to continue with the procedure. Attestation: I, the ordering provider, attest that I have discussed with the patient the benefits, risks, side-effects, alternatives, likelihood of achieving goals, and potential problems during recovery for the procedure that I have provided informed consent. Date  Time: {CHL [facility]-PAIN TIME CHOICES:[id number]}   Pre-Procedure Preparation:   Monitoring: As per clinic protocol. Respiration, ETCO2, SpO2, BP, heart rate and rhythm monitor placed and checked for adequate function Safety Precautions: Patient was assessed for positional comfort and pressure points before starting the procedure. Time-out: I initiated and conducted the "Time-out" before starting the procedure, as per protocol. The patient was asked to participate by confirming the accuracy of the "Time Out" information. Verification of the correct person, site, and procedure were performed and confirmed by me, the nursing staff, and the patient. "Time-out" conducted as per Joint Commission's Universal Protocol (UP.01.01.01). Time:   Start Time:   hrs.  Description of Procedure:          Target Area: Caudal Epidural Canal Approach: Midline approach Area Prepped: Entire Posterior Sacrococcygeal Region DuraPrep (Iodine Povacrylex [0.7% available iodine] and Isopropyl Alcohol, 74% w/w) Safety Precautions: Aspiration looking for blood return was conducted prior to all injections. At no point did I inject any substances, as a needle was being advanced. No attempts were made at seeking any paresthesias. Safe injection practices and needle disposal techniques used. Medications properly checked for expiration dates. SDV (single dose vial) medications used. Description of the Procedure: Protocol guidelines were followed. The patient was placed in position over the fluoroscopy table. Patient assessed for comfort and pressure points before starting procedure. The target area was identified and the area prepped in the usual manner. Skin & deeper tissues infiltrated with local anesthetic. Appropriate amount of time allowed to pass for local anesthetics to take effect. The epidural needle was then advanced to the target area, via the sacral hiatus, between the sacral cornu. Proper needle placement secured. Negative aspiration confirmed. Step 1: Epidurogram performed by slowly injecting a  non-ionic, water-soluble, hypoallergenic, myelogram-compatible radiological contrast. Defect identified and Racz catheter advanced slowly next to rest proximal to it without attempting to perforate or puncture the defect. At this point, the epidural needle was removed. Step 2:  Contrast was again injected, this time thru the catheter, under live fluoroscopy, closely observing for vascular uptake or intrathecal spread. Step 3: Once no vascular uptake or intrathecal spread was confirmed, a 2 mL test-dose using 2% PF-Lidocaine with 1:200,000 Epinephrine was injected thru the catheter, while closely monitoring for an increase in heart rate or evidence of spinal anesthesia. Step 4: After waiting over 5 minutes, the patient was assessed for evidence of a spinal block. Step 5: Once I had confirmed that there was no vascular uptake or evidence of intrathecal spread, I then slowly injected 1,500 Units of hyaluronidase, carefully monitoring for allergic reactions. I then waited 10 minutes, using this time to secure the catheter using a sterile benzoin tincture swab and (41mm x 100 mm) steri-strip. After confirming vitals to be stable, the patient was transferred to the recovery area where Mr. Pastor was kept under constant observation and monitored as per post-sedation protocol. Step 6: After the 10 minutes, I proceeded to slowly inject a steroid solution containing 0.2% MPF-Ropivacaine (4 mL) + 0.9% PF-NSS (5 mL) + SDV Triamcinolone 40 mg/mL (1 mL), in intermittent fashion, asking for systemic symptoms every 0.5cc of injectate. Step 7:  30 minutes later, a neurological exam was conducted for any evidence of a spinal blockade. Step 8:  After confirming the absence of anesthesia, I slowly injected the 10% PF-Hypertonic Saline, stopping to assess, any time the patient described any discomfort. Once the procedure was completed, I removed the catheter, taking time to show the patient its spring tip, and demonstrating to the  patient that none had been left behind. EBL: None Materials & Medications used:  1. Racz Tun-L-Kath (Epimed, [place], [place]) Catheter. (or similar)(Perifix 20Gx100cm) 2. 2" Foam Tape 3. Benzoin tincture swab 4. Steri-Strip (81mm x 100 mm) 5. Non-occlusive Transparent Dressing (3MT TegadermT) 6. Bacteriostatic Filter for Epidural Catheter 7. Epidural Kit for 20G catheter Needle(s) used: 20g - 10cm, Tuohy-style epidural needle   Medications used:  1. Skin infiltration: 2.0% Lidocaine (100ml) 2. Test-dose: 1.5 % Lidocaine w/ Epi 1:200,000 (77ml) 3. Epidural Steroid injection:  A). Steroid: Triamcinolone 40 mg/mL (SDV) (73ml) B). Local Anesthetic: 0.2% PF-Ropivacaine (2 mg/mL) (4 mL) C). Dilution agent: 0.9% PF-NSS (injectable saline) (5 mL) 6. Neurolytic Agent: 10% PF-Sodium Chloride (Hypertonic Saline) (34ml) [23.4% PF-Sodium Chloride (4.220mL) + PF-Sterile H2O (5.71mL) = 10% PF-Sodium Chloride (62mL)] 7. Scar softening agent: Hyaluronidase (150 units/mL) x (10 mL) = 1500 Units 8. Radiological Contrast Media: Isovue-M 200 (10 mL)  There were no vitals filed for this visit.  Start Time:   hrs. End Time:   hrs. Epidurogram:  Epidurogram flow pattern demonstrated a restricted low at the level of the surgery. The epidural catheter was placed next to this restriction without attempting to perforate it. *** Materials:  Needle(s) Type:    ***    Gauge:    ***    Length:    ***    Medication(s): Please see orders for medications and dosing details.  Imaging Guidance (Spinal):          Type of Imaging Technique: Fluoroscopy Guidance (Spinal) Indication(s): Assistance in needle guidance and placement for procedures requiring needle placement in or near specific anatomical locations not easily accessible without such assistance. Exposure Time: Please see nurses notes. Contrast: Before injecting any contrast, we confirmed that the patient did not have an allergy to iodine, shellfish, or  radiological contrast. Once satisfactory needle placement was completed at the desired level, radiological contrast was injected. Contrast injected under  live fluoroscopy. No contrast complications. See chart for type and volume of contrast used. Fluoroscopic Guidance: I was personally present during the use of fluoroscopy. "Tunnel Vision Technique" used to obtain the best possible view of the target area. Parallax error corrected before commencing the procedure. "Direction-depth-direction" technique used to introduce the needle under continuous pulsed fluoroscopy. Once target was reached, antero-posterior, oblique, and lateral fluoroscopic projection used confirm needle placement in all planes. Images permanently stored in EMR. Interpretation: I personally interpreted the imaging intraoperatively. Adequate needle placement confirmed in multiple planes. Appropriate spread of contrast into desired area was observed. No evidence of afferent or efferent intravascular uptake. No intrathecal or subarachnoid spread observed. Permanent images saved into the patient's record.  Antibiotic Prophylaxis:   Anti-infectives (From admission, onward)    None      Indication(s): None identified  Post-operative Assessment:  Post-procedure Vital Signs:  Pulse/HCG Rate:    Temp:   Resp:   BP:   SpO2:    EBL: None  Complications: No immediate post-treatment complications observed by team, or reported by patient.  Note: The patient tolerated the entire procedure well. A repeat set of vitals were taken after the procedure and the patient was kept under observation following institutional policy, for this type of procedure. Post-procedural neurological assessment was performed, showing return to baseline, prior to discharge. The patient was provided with post-procedure discharge instructions, including a section on how to identify potential problems. Should any problems arise concerning this procedure, the patient  was given instructions to immediately contact us, at any time, without hesitation. In any case, we plan to contact the patient by telephone for a follow-up status report regarding this interventional procedure.  Comments:  No additional relevant information.  Plan of Care (POC)  Orders:  No orders of the defined types were placed in this encounter.  Chronic Opioid Analgesic:  Oxycodone IR 10 mg, 1 tab PO q 6 hrs (40 mg/day of oxycodone) MME/day: 60 mg/day.   Medications ordered for procedure: No orders of the defined types were placed in this encounter.  Medications administered: [name] had no medications administered during this visit.  See the medical record for exact dosing, route, and time of administration.  Follow-up plan:   No follow-ups on file.       Interventional Therapies  Risk  Complexity Considerations:   Estimated body mass index is 24.54 kg/m as calculated from the following:   Height as of this encounter: 5\' 10"  (1.778 m).   Weight as of this encounter: 171 lb (77.6 kg).    WNL   Planned  Pending:      Under consideration:   Possible bilateral lumbar facet RFA  Diagnostic bilateral SI Blk #1  Possible bilateral SI RFA  Possible spinal cord stimulator trial    Completed:   Therapeutic right L2-3 LESI x1 (08/29/2020)  Diagnostic left lumbar facet MBB x2 (01/15/2022) (100/100/75/75)  Diagnostic right lumbar facet MBB x1 (01/15/2022) (100/100/75/75)  Diagnostic left SI block x1 (03/21/2020)  Therapeutic/palliative right lateral epicondyle (elbow) injection x2 (01/21/2021)  Therapeutic/palliative left Racz procedure x7 (targeting the left S2, S3 area) (12/18/2021) (0/0/50/90) complete relief of the radicular component.  No benefit to the back pain.   Therapeutic  Palliative (PRN) options:   Palliative right lateral epicondyle (elbow) injection #2  Palliative left Racz procedure #7 (targeting the left S2, S3 area)    Pharmacotherapy  Nonopioids  transferred 05/02/2020: Zanaflex, Flexeril, and Mobic  Recent Visits Date Type Provider Dept  08/05/22 Office Visit Milinda Pointer, MD [facility]  Showing recent visits within past 90 days and meeting all other requirements Future Appointments Date Type Provider Dept  10/01/22 Appointment Milinda Pointer, [facility]  11/09/22 Appointment Milinda Pointer, MD [facility]  Showing future appointments within next 90 days and meeting all other requirements  Disposition: Discharge home  Discharge (Date  Time): 10/01/2022;   hrs.   Primary Care Physician: Donnamarie Rossetti, PA-C Location: [facility] Note by: Gaspar Cola, MD (TTS technology used. I apologize for any typographical errors that were not detected and corrected.) Date: 10/01/2022; Time: 12:18 PM  Disclaimer:  Medicine is not an Chief Strategy Officer. The only guarantee in medicine is that nothing is guaranteed. It is important to note that the decision to proceed with this intervention was based on the information collected from the patient. The Data and conclusions were drawn from the patient's questionnaire, the interview, and the physical examination. Because the information was provided in large part by the patient, it cannot be guaranteed that it has not been purposely or unconsciously manipulated. Every effort has been made to obtain as much relevant data as possible for this evaluation. It is important to note that the conclusions that lead to this procedure are derived in large part from the available data. Always take into account that the treatment will also be dependent on availability of resources and existing treatment guidelines, considered by other Pain Management Practitioners as being common knowledge and practice, at the time of the intervention. For Medico-Legal purposes, it is also important to point out that variation in procedural  techniques and pharmacological choices are the acceptable norm. The indications, contraindications, technique, and results of the above procedure should only be interpreted and judged by a Board-Certified Interventional Pain Specialist with extensive familiarity and expertise in the same exact procedure and technique.

## 2022-10-01 ENCOUNTER — Ambulatory Visit: Payer: BC Managed Care – PPO | Attending: Pain Medicine | Admitting: Pain Medicine

## 2022-10-01 ENCOUNTER — Ambulatory Visit
Admission: RE | Admit: 2022-10-01 | Discharge: 2022-10-01 | Disposition: A | Discharge status: Home / Self Care | Payer: BC Managed Care – PPO | Source: Ambulatory Visit | Attending: Pain Medicine | Admitting: Pain Medicine

## 2022-10-01 ENCOUNTER — Encounter: Payer: Self-pay | Admitting: Pain Medicine

## 2022-10-01 VITALS — BP 112/65 | HR 94 | Temp 97.0°F | Resp 16 | Ht 70.0 in | Wt 174.0 lb

## 2022-10-01 DIAGNOSIS — G8929 Other chronic pain: Secondary | ICD-10-CM | POA: Diagnosis not present

## 2022-10-01 DIAGNOSIS — Z79891 Long term (current) use of opiate analgesic: Secondary | ICD-10-CM | POA: Diagnosis present

## 2022-10-01 DIAGNOSIS — M961 Postlaminectomy syndrome, not elsewhere classified: Secondary | ICD-10-CM | POA: Insufficient documentation

## 2022-10-01 DIAGNOSIS — M5441 Lumbago with sciatica, right side: Secondary | ICD-10-CM | POA: Diagnosis not present

## 2022-10-01 DIAGNOSIS — M79605 Pain in left leg: Secondary | ICD-10-CM | POA: Insufficient documentation

## 2022-10-01 DIAGNOSIS — M5137 Other intervertebral disc degeneration, lumbosacral region: Secondary | ICD-10-CM | POA: Insufficient documentation

## 2022-10-01 DIAGNOSIS — G894 Chronic pain syndrome: Secondary | ICD-10-CM | POA: Insufficient documentation

## 2022-10-01 DIAGNOSIS — M79604 Pain in right leg: Secondary | ICD-10-CM | POA: Diagnosis present

## 2022-10-01 DIAGNOSIS — M5442 Lumbago with sciatica, left side: Secondary | ICD-10-CM | POA: Insufficient documentation

## 2022-10-01 DIAGNOSIS — G96198 Other disorders of meninges, not elsewhere classified: Secondary | ICD-10-CM | POA: Diagnosis present

## 2022-10-01 DIAGNOSIS — M5417 Radiculopathy, lumbosacral region: Secondary | ICD-10-CM | POA: Diagnosis present

## 2022-10-01 DIAGNOSIS — Z79899 Other long term (current) drug therapy: Secondary | ICD-10-CM | POA: Insufficient documentation

## 2022-10-01 MED ORDER — IOHEXOL 180 MG/ML  SOLN
INTRAMUSCULAR | Status: AC
  Filled 2022-10-01: qty 10

## 2022-10-01 MED ORDER — FENTANYL CITRATE (PF) 100 MCG/2ML IJ SOLN
INTRAMUSCULAR | Status: AC
  Filled 2022-10-01: qty 2

## 2022-10-01 MED ORDER — ROPIVACAINE HCL 2 MG/ML IJ SOLN
INTRAMUSCULAR | Status: AC
  Filled 2022-10-01: qty 20

## 2022-10-01 MED ORDER — TRIAMCINOLONE ACETONIDE 40 MG/ML IJ SUSP
40.0000 mg | Freq: Once | INTRAMUSCULAR | Status: AC
  Administered 2022-10-01: 40 mg

## 2022-10-01 MED ORDER — STERILE WATER FOR INJECTION IJ SOLN
INTRAMUSCULAR | Status: AC
  Filled 2022-10-01: qty 10

## 2022-10-01 MED ORDER — STERILE WATER FOR INJECTION IJ SOLN
10.0000 mL | Freq: Once | INTRAVENOUS | Status: AC
  Administered 2022-10-01: 10 mL via EPIDURAL
  Filled 2022-10-01: qty 4.27

## 2022-10-01 MED ORDER — FENTANYL CITRATE (PF) 100 MCG/2ML IJ SOLN
25.0000 ug | INTRAMUSCULAR | Status: DC | PRN
  Administered 2022-10-01: 50 ug via INTRAVENOUS

## 2022-10-01 MED ORDER — LIDOCAINE HCL 2 % IJ SOLN
INTRAMUSCULAR | Status: AC
  Filled 2022-10-01: qty 20

## 2022-10-01 MED ORDER — ROPIVACAINE HCL 2 MG/ML IJ SOLN
4.0000 mL | Freq: Once | INTRAMUSCULAR | Status: AC
  Administered 2022-10-01: 4 mL via EPIDURAL

## 2022-10-01 MED ORDER — IOHEXOL 180 MG/ML  SOLN
10.0000 mL | Freq: Once | INTRAMUSCULAR | Status: AC
  Administered 2022-10-01: 10 mL via EPIDURAL

## 2022-10-01 MED ORDER — CEFAZOLIN SODIUM 1 G IJ SOLR
INTRAMUSCULAR | Status: AC
  Filled 2022-10-01: qty 10

## 2022-10-01 MED ORDER — LIDOCAINE HCL (PF) 2 % IJ SOLN
10.0000 mL | Freq: Once | INTRAMUSCULAR | Status: AC
  Administered 2022-10-01: 10 mL

## 2022-10-01 MED ORDER — SODIUM CHLORIDE (PF) 0.9 % IJ SOLN
INTRAMUSCULAR | Status: AC
  Filled 2022-10-01: qty 20

## 2022-10-01 MED ORDER — LIDOCAINE-EPINEPHRINE (PF) 2 %-1:200000 IJ SOLN
10.0000 mL | Freq: Once | INTRAMUSCULAR | Status: AC
  Administered 2022-10-01: 10 mL

## 2022-10-01 MED ORDER — CEFAZOLIN SODIUM-DEXTROSE 1-4 GM/50ML-% IV SOLN
1.0000 g | INTRAVENOUS | Status: AC
  Administered 2022-10-01: 1 g via INTRAVENOUS

## 2022-10-01 MED ORDER — OXYCODONE HCL 10 MG PO TABS: 10.0000 mg | ORAL_TABLET | Freq: Four times a day (QID) | ORAL | 0 refills | Status: DC | PRN

## 2022-10-01 MED ORDER — MIDAZOLAM HCL 5 MG/5ML IJ SOLN
INTRAMUSCULAR | Status: AC
  Filled 2022-10-01: qty 5

## 2022-10-01 MED ORDER — TRIAMCINOLONE ACETONIDE 40 MG/ML IJ SUSP
INTRAMUSCULAR | Status: AC
  Filled 2022-10-01: qty 1

## 2022-10-01 MED ORDER — LACTATED RINGERS IV SOLN: Freq: Once | INTRAVENOUS | Status: AC

## 2022-10-01 MED ORDER — SODIUM CHLORIDE 0.9% FLUSH
4.0000 mL | Freq: Once | INTRAVENOUS | Status: AC
  Administered 2022-10-01: 4 mL

## 2022-10-01 MED ORDER — HYALURONIDASE HUMAN 150 UNIT/ML IJ SOLN
1500.0000 [IU] | Freq: Once | INTRAMUSCULAR | Status: AC
  Administered 2022-10-01: 1500 [IU] via INTRADERMAL
  Filled 2022-10-01: qty 10

## 2022-10-01 MED ORDER — MIDAZOLAM HCL 2 MG/2ML IJ SOLN
1.0000 mg | INTRAMUSCULAR | Status: DC | PRN
  Administered 2022-10-01: 3 mg via INTRAVENOUS

## 2022-10-01 NOTE — Progress Notes (Signed)
Safety precautions to be maintained throughout the outpatient stay will include: orient to surroundings, keep bed in low position, maintain call bell within reach at all times, provide assistance with transfer out of bed and ambulation.  

## 2022-10-01 NOTE — Progress Notes (Signed)
0906 TEST DOSE GIVEN 0909 HYLENASE 1500 UNIITS GIVEN 0927 steroid mix given

## 2022-10-02 ENCOUNTER — Telehealth: Payer: Self-pay | Admitting: *Deleted

## 2022-10-02 NOTE — Telephone Encounter (Signed)
Post procedure call;  reports that he is doing well.

## 2022-10-14 NOTE — Progress Notes (Unsigned)
Patient: Derek [name]  Service Category: E/M  Provider: Oswaldo Done, MD  DOB: Sep 14, 1980  DOS: 10/15/2022  Location: Office  MRN: [id number]  Setting: Ambulatory outpatient  Referring Provider: Wilford Corner,*  Type: Established Patient  Specialty: Interventional Pain Management  PCP: Wilford Corner, PA-C  Location: Remote location  Delivery: TeleHealth     Virtual Encounter - Pain Management PROVIDER NOTE: Information contained herein reflects review and annotations entered in association with encounter. Interpretation of such information and data should be left to medically-trained personnel. Information provided to patient can be located elsewhere in the medical record under "Patient Instructions". Document created using STT-dictation technology, any transcriptional errors that may result from process are unintentional.    Contact & Pharmacy Preferred: [phone] Home: [phone] (home) Mobile: [phone] (mobile) E-mail: [email or web address]  [facility] [place], [place] - [address] [place] [address] Phone: [phone] Fax: [phone]  [id number] [place] [facility] - [address] [place] [address] Phone: [phone] Fax: [phone]   Pre-screening  Derek [name] offered "in-person" vs "virtual" encounter. He indicated preferring virtual for this encounter.   Reason COVID-19*  Social distancing based on CDC and AMA recommendations.   I contacted Derek [name] on 10/15/2022 via telephone.      I clearly identified myself as Oswaldo Done, MD. I verified that I was speaking with the correct person using two identifiers (Name: Derek [name], and date of birth: 05-31-1981).  Consent I sought verbal advanced consent from Derek [name] for virtual visit interactions. I informed Derek [name] of possible security and privacy concerns, risks, and limitations associated with providing  "not-in-person" medical evaluation and management services. I also informed Derek [name] of the availability of "in-person" appointments. Finally, I informed him that there would be a charge for the virtual visit and that he could be  personally, fully or partially, financially responsible for it. Mr. Okane expressed understanding and agreed to proceed.   Historic Elements   Derek [name] is a 42 y.o. year old, male patient evaluated today after our last contact on 10/01/2022. Derek [name]  has a past medical history of Anxiety, Arthritis, and GERD (gastroesophageal reflux disease). He also  has a past surgical history that includes Back surgery; HAND REIMPLANTED; and Lumbar fusion (11/14). Derek [name] has a current medication list which includes the following prescription(s): calcium carb-cholecalciferol, celecoxib, cyclobenzaprine, diclofenac sodium, esomeprazole, naloxone, oxycodone hcl, [START ON 11/12/2022] oxycodone hcl, [START ON 12/12/2022] oxycodone hcl, meloxicam, and tizanidine. He  reports that he has been smoking cigarettes. He has a 16.00 pack-year smoking history. He has never used smokeless tobacco. He reports current alcohol use. He reports that he does not use drugs. Derek [name] is allergic to hydrocodone, amoxicillin, and vicodin [hydrocodone-acetaminophen].  BMI: Estimated body mass index is 24.97 kg/m as calculated from the following:   Height as of 10/01/22: 5\' 10"  (1.778 m).   Weight as of 10/01/22: 174 lb (78.9 kg). Last encounter: 08/05/2022. Last procedure: 10/01/2022.  HPI  Today, he is being contacted for a post-procedure assessment.  Post-procedure evaluation    Procedure:          Anesthesia, Analgesia, Anxiolysis:  Type: Therapeutic Percutaneous Epidural Neuroplasty and Lysis of Adhesions (RACZ Procedure)   #8  (Partial-incomplete)(No hypertonic) Region: Caudal Level: Sacrococcygeal   Laterality: Midline aiming at the left  Anesthesia: Local (1-2% Lidocaine)  Anxiolysis: IV  Versed 3  mg Sedation: Moderate Fentanyl 1 mL (50 mcg) Guidance: Fluoroscopy           Position: Prone   1. Chronic low back pain (1ry area of Pain) (Bilateral) (L>R) w/ sciatica (Bilateral)   2. Chronic lower extremity pain (2ry area of Pain) (Bilateral) (L>R)   3. DDD (degenerative disc disease), lumbosacral   4. Epidural fibrosis   5. Failed back surgical syndrome (x3)   6. Radicular pain of lumbosacral region   7. Chronic pain syndrome    NAS-11 Pain score:   Pre-procedure: 7 /10   Post-procedure: 0-No pain/10    RTCB: 01/11/2023 (cancel the 11/09/2022 medication management encounter and change it to just before 01/11/2023)    Effectiveness:  Initial hour after procedure: 100 %. Subsequent 4-6 hours post-procedure: 100 %. Analgesia past initial 6 hours: 90 % (ongoing). Ongoing improvement:  Analgesic:  *** Function:    ***    ROM:    ***     Pharmacotherapy Assessment   Opioid Analgesic: Oxycodone IR 10 mg, 1 tab PO q 6 hrs (40 mg/day of oxycodone) MME/day: 60 mg/day.   Monitoring: [place] PMP: PDMP reviewed during this encounter.       Pharmacotherapy: No side-effects or adverse reactions reported. Compliance: No problems identified. Effectiveness: Clinically acceptable. Plan: Refer to "POC". UDS:  Summary  Date Value Ref Range Status  02/02/2022 Note  Final    Comment:    ==================================================================== [employer] Select 13 (MW) ==================================================================== Test                             Result       Flag       Units  Drug Present and Declared for Prescription Verification   Oxycodone                      2636         EXPECTED   ng/mg creat   Oxymorphone                    5162         EXPECTED   ng/mg creat   Noroxycodone                   3235         EXPECTED   ng/mg creat   Noroxymorphone                 1681         EXPECTED   ng/mg creat    Sources of oxycodone are scheduled  prescription medications.    Oxymorphone, noroxycodone, and noroxymorphone are expected    metabolites of oxycodone. Oxymorphone is also available as a    scheduled prescription medication.  ==================================================================== Test                      Result    Flag   Units      Ref Range   Creatinine              165              mg/dL      >=32>=20 ==================================================================== Declared Medications:  The flagging and interpretation on this report are based on the  following declared medications.  Unexpected results may arise from  inaccuracies in the declared medications.   **Note: The testing scope of this panel  includes these medications:   Oxycodone   **Note: The testing scope of this panel does not include the  following reported medications:   Calcium  Cholecalciferol  Cyclobenzaprine  Diclofenac  Esomeprazole (Nexium)  Meloxicam (Mobic)  Tizanidine (Zanaflex) ==================================================================== For clinical consultation, please call [phone]. ====================================================================    No results found for: "CBDTHCR", "D8THCCBX", "D9THCCBX"   Laboratory Chemistry Profile   Renal Lab Results  Component Value Date   BUN 11 01/16/2019   CREATININE 1.08 01/16/2019   BCR 10 01/16/2019   GFRAA 99 01/16/2019   GFRNONAA 86 01/16/2019    Hepatic Lab Results  Component Value Date   AST 24 01/16/2019   ALBUMIN 4.6 01/16/2019   ALKPHOS 108 01/16/2019    Electrolytes Lab Results  Component Value Date   NA 143 01/16/2019   K 4.0 01/16/2019   CL 105 01/16/2019   CALCIUM 10.2 01/16/2019   MG 2.0 01/16/2019    Bone Lab Results  Component Value Date   25OHVITD1 39 01/16/2019   25OHVITD2 1.2 01/16/2019   25OHVITD3 38 01/16/2019    Inflammation (CRP: Acute Phase) (ESR: Chronic Phase) Lab Results  Component Value Date   CRP 4  01/16/2019   ESRSEDRATE 29 (H) 01/16/2019         Note: Above Lab results reviewed.  Imaging  DG PAIN CLINIC C-ARM 1-60 MIN NO REPORT Fluoro was used, but no Radiologist interpretation will be provided.  Please refer to "NOTES" tab for provider progress note.  Assessment  The primary encounter diagnosis was Chronic low back pain (1ry area of Pain) (Bilateral) (L>R) w/ sciatica (Bilateral). Diagnoses of Chronic lower extremity pain (2ry area of Pain) (Bilateral) (L>R), Radicular pain of lumbosacral region, Failed back surgical syndrome (x3), Epidural fibrosis, and Postop check were also pertinent to this visit.  Plan of Care  Problem-specific:  No problem-specific Assessment & Plan notes found for this encounter.  Derek [name] has a current medication list which includes the following long-term medication(s): calcium carb-cholecalciferol, esomeprazole, oxycodone hcl, [START ON 11/12/2022] oxycodone hcl, [START ON 12/12/2022] oxycodone hcl, meloxicam, and tizanidine.  Pharmacotherapy (Medications Ordered): No orders of the defined types were placed in this encounter.  Orders:  No orders of the defined types were placed in this encounter.  Follow-up plan:   No follow-ups on file.      Interventional Therapies  Risk Factors  Considerations:      Lumbar Hardware: No RFA    Planned  Pending:   Therapeutic/palliative left RACZ procedure #8 (10/01/2022) (+ saddle block - NO HYPERTONIC injected)   Under consideration:   Possible bilateral lumbar facet RFA  Diagnostic bilateral SI Blk #1  Possible bilateral SI RFA  Possible spinal cord stimulator trial    Completed:   Therapeutic right L2-3 LESI x1 (08/29/2020)  Diagnostic left lumbar facet MBB x2 (01/15/2022) (100/100/75/75)  Diagnostic right lumbar facet MBB x1 (01/15/2022) (100/100/75/75)  Diagnostic left SI Blk x1 (03/21/2020)  Therapeutic/palliative right lateral epicondyle (elbow) inj. x2 (01/21/2021)   Therapeutic/palliative left RACZ procedure x7 (targeting the left S2, S3 area) (12/18/2021) (0/0/50/90) complete relief of the radicular component.  No benefit to the back pain.   Therapeutic  Palliative (PRN) options:   Palliative right lateral epicondyle (elbow) inj. #2  Palliative left RACZ procedure #8 (targeting the left S2, S3 area)    Pharmacotherapy  Nonopioids transferred 05/02/2020: Zanaflex, Flexeril, and Mobic       Recent Visits Date Type Provider Dept  10/01/22 Procedure visit Laban Emperor,  Para March, MD [facility]  08/05/22 Office Visit Delano Metz, MD [facility]  Showing recent visits within past 90 days and meeting all other requirements Future Appointments Date Type Provider Dept  10/15/22 Office Visit Delano Metz, MD [facility]  01/06/23 Appointment Delano Metz, MD [facility]  Showing future appointments within next 90 days and meeting all other requirements  I discussed the assessment and treatment plan with the patient. The patient was provided an opportunity to ask questions and all were answered. The patient agreed with the plan and demonstrated an understanding of the instructions.  Patient advised to call back or seek an in-person evaluation if the symptoms or condition worsens.  Duration of encounter: *** minutes.  Note by: Oswaldo Done, MD Date: 10/15/2022; Time: 8:28 AM

## 2022-10-15 ENCOUNTER — Ambulatory Visit: Payer: BC Managed Care – PPO | Attending: Pain Medicine | Admitting: Pain Medicine

## 2022-10-15 DIAGNOSIS — M5442 Lumbago with sciatica, left side: Secondary | ICD-10-CM

## 2022-10-15 DIAGNOSIS — M79605 Pain in left leg: Secondary | ICD-10-CM

## 2022-10-15 DIAGNOSIS — M79604 Pain in right leg: Secondary | ICD-10-CM

## 2022-10-15 DIAGNOSIS — M5417 Radiculopathy, lumbosacral region: Secondary | ICD-10-CM | POA: Diagnosis not present

## 2022-10-15 DIAGNOSIS — G96198 Other disorders of meninges, not elsewhere classified: Secondary | ICD-10-CM

## 2022-10-15 DIAGNOSIS — M961 Postlaminectomy syndrome, not elsewhere classified: Secondary | ICD-10-CM

## 2022-10-15 DIAGNOSIS — Z09 Encounter for follow-up examination after completed treatment for conditions other than malignant neoplasm: Secondary | ICD-10-CM

## 2022-10-15 DIAGNOSIS — G8929 Other chronic pain: Secondary | ICD-10-CM

## 2022-10-15 DIAGNOSIS — M5441 Lumbago with sciatica, right side: Secondary | ICD-10-CM

## 2022-11-09 ENCOUNTER — Encounter: Payer: BC Managed Care – PPO | Admitting: Pain Medicine

## 2023-01-04 NOTE — Progress Notes (Unsigned)
PROVIDER NOTE: Information contained herein reflects review and annotations entered in association with encounter. Interpretation of such information and data should be left to medically-trained personnel. Information provided to patient can be located elsewhere in the medical record under "Patient Instructions". Document created using STT-dictation technology, any transcriptional errors that may result from process are unintentional.    Patient: Derek [name]  Service Category: E/M  Provider: Oswaldo Done, MD  DOB: 1981-06-06  DOS: 01/06/2023  Referring Provider: Wilford Corner  MRN: [id number]  Specialty: Interventional Pain Management  PCP: Wilford Corner, PA-C  Type: Established Patient  Setting: Ambulatory outpatient    Location: Office  Delivery: Face-to-face     HPI  Derek [name], a 42 y.o. year old male, is here today because of his No primary diagnosis found.. Derek [name] primary complain today is No chief complaint on file.  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); Somatic dysfunction of sacroiliac joints (Bilateral); Chronic hip pain (Bilateral); Epidural fibrosis; Lumbar postlaminectomy syndrome; DDD (degenerative disc disease), lumbosacral; Lumbar facet syndrome (Bilateral) (L>R); Other specified dorsopathies, sacral and sacrococcygeal region; Spondylosis without myelopathy or radiculopathy, lumbosacral region; Neurogenic pain; Osteoarthritic spondylosis of lumbar spine; Tendinitis of elbows (Bilateral); Lateral epicondylitis of elbows (Bilateral); Chronic elbow pain  (Right); Chronic low back pain (Left) w/o sciatica; Numbness of anterior thigh (Right); Burning pain in thigh (Right); Chronic low back pain (Midline) w/o sciatica; Acute exacerbation of chronic low back pain; Chronic elbow pain (Bilateral); Radial collateral ligament sprain of elbow, unspecified laterality, sequela (Bilateral); Enthesopathy of elbow region (Bilateral); Sprain of lateral collateral ligament of elbow, sequela (Left); Sprain of lateral collateral ligament of elbow, sequela (Right); Radicular pain of lumbosacral region; Lumbosacral radiculopathy; and Chronic low back pain (Bilateral) w/o sciatica on their pertinent problem list. Pain Assessment: Severity of   is reported as a  /10. Location:    / . Onset:  . Quality:  . Timing:  . Modifying factor(s):  Marland Kitchen Vitals:  vitals were not taken for this visit.  BMI: Estimated body mass index is 24.97 kg/m as calculated from the following:   Height as of 10/01/22: 5\' 10"  (1.778 m).   Weight as of 10/01/22: 174 lb (78.9 kg). Last encounter: 10/15/2022. Last procedure: 10/01/2022.  Reason for encounter:  *** . ***  Pharmacotherapy Assessment  Analgesic: Oxycodone IR 10 mg, 1 tab PO q 6 hrs (40 mg/day of oxycodone) MME/day: 60 mg/day.   Monitoring: [place] PMP: PDMP reviewed during this encounter.       Pharmacotherapy: No side-effects or adverse reactions reported. Compliance: No problems identified. Effectiveness: Clinically acceptable.  No notes on file  No results found for: "CBDTHCR" No results found for: "D8THCCBX" No results found for: "D9THCCBX"  UDS:  Summary  Date Value Ref Range Status  02/02/2022 Note  Final    Comment:    ==================================================================== [employer] Select 13 (MW) ==================================================================== Test                             Result       Flag       Units  Drug Present and Declared for Prescription Verification  Oxycodone                       2636         EXPECTED   ng/mg creat   Oxymorphone                    5162         EXPECTED   ng/mg creat   Noroxycodone                   3235         EXPECTED   ng/mg creat   Noroxymorphone                 1681         EXPECTED   ng/mg creat    Sources of oxycodone are scheduled prescription medications.    Oxymorphone, noroxycodone, and noroxymorphone are expected    metabolites of oxycodone. Oxymorphone is also available as a    scheduled prescription medication.  ==================================================================== Test                      Result    Flag   Units      Ref Range   Creatinine              165              mg/dL      >=40 ==================================================================== Declared Medications:  The flagging and interpretation on this report are based on the  following declared medications.  Unexpected results may arise from  inaccuracies in the declared medications.   **Note: The testing scope of this panel includes these medications:   Oxycodone   **Note: The testing scope of this panel does not include the  following reported medications:   Calcium  Cholecalciferol  Cyclobenzaprine  Diclofenac  Esomeprazole (Nexium)  Meloxicam (Mobic)  Tizanidine (Zanaflex) ==================================================================== For clinical consultation, please call [phone]. ====================================================================       ROS  Constitutional: Denies any fever or chills Gastrointestinal: No reported hemesis, hematochezia, vomiting, or acute GI distress Musculoskeletal: Denies any acute onset joint swelling, redness, loss of ROM, or weakness Neurological: No reported episodes of acute onset apraxia, aphasia, dysarthria, agnosia, amnesia, paralysis, loss of coordination, or loss of consciousness  Medication Review  Calcium Carb-Cholecalciferol, Diclofenac Sodium, Oxycodone HCl,  celecoxib, cyclobenzaprine, esomeprazole, meloxicam, naloxone, and tiZANidine  History Review  Allergy: Derek [name] is allergic to hydrocodone, amoxicillin, and vicodin [hydrocodone-acetaminophen]. Drug: Derek [name]  reports no history of drug use. Alcohol:  reports current alcohol use. Tobacco:  reports that he has been smoking cigarettes. He has a 16.00 pack-year smoking history. He has never used smokeless tobacco. Social: Derek [name]  reports that he has been smoking cigarettes. He has a 16.00 pack-year smoking history. He has never used smokeless tobacco. He reports current alcohol use. He reports that he does not use drugs. Medical:  has a past medical history of Anxiety, Arthritis, and GERD (gastroesophageal reflux disease). Surgical: Derek [name]  has a past surgical history that includes Back surgery; HAND REIMPLANTED; and Lumbar fusion (11/14). Family: family history is not on file.  Laboratory Chemistry Profile   Renal Lab Results  Component Value Date   BUN 11 01/16/2019   CREATININE 1.08 01/16/2019   BCR 10 01/16/2019   GFRAA 99 01/16/2019   GFRNONAA 86 01/16/2019    Hepatic Lab Results  Component Value Date  AST 24 01/16/2019   ALBUMIN 4.6 01/16/2019   ALKPHOS 108 01/16/2019    Electrolytes Lab Results  Component Value Date   NA 143 01/16/2019   K 4.0 01/16/2019   CL 105 01/16/2019   CALCIUM 10.2 01/16/2019   MG 2.0 01/16/2019    Bone Lab Results  Component Value Date   25OHVITD1 39 01/16/2019   25OHVITD2 1.2 01/16/2019   25OHVITD3 38 01/16/2019    Inflammation (CRP: Acute Phase) (ESR: Chronic Phase) Lab Results  Component Value Date   CRP 4 01/16/2019   ESRSEDRATE 29 (H) 01/16/2019         Note: Above Lab results reviewed.  Recent Imaging Review  DG PAIN CLINIC C-ARM 1-60 MIN NO REPORT Fluoro was used, but no Radiologist interpretation will be provided.  Please refer to "NOTES" tab for provider progress note. Note: Reviewed        Physical Exam   General appearance: Well nourished, well developed, and well hydrated. In no apparent acute distress Mental status: Alert, oriented x 3 (person, place, & time)       Respiratory: No evidence of acute respiratory distress Eyes: PERLA Vitals: There were no vitals taken for this visit. BMI: Estimated body mass index is 24.97 kg/m as calculated from the following:   Height as of 10/01/22: 5\' 10"  (1.778 m).   Weight as of 10/01/22: 174 lb (78.9 kg). Ideal: Patient weight not recorded  Assessment   Diagnosis Status  No diagnosis found. Controlled Controlled Controlled   Updated Problems: No problems updated.  Plan of Care  Problem-specific:  No problem-specific Assessment & Plan notes found for this encounter.  Derek [name] has a current medication list which includes the following long-term medication(s): calcium carb-cholecalciferol, esomeprazole, meloxicam, oxycodone hcl, oxycodone hcl, oxycodone hcl, and tizanidine.  Pharmacotherapy (Medications Ordered): No orders of the defined types were placed in this encounter.  Orders:  No orders of the defined types were placed in this encounter.  Follow-up plan:   No follow-ups on file.      Interventional Therapies  Risk Factors  Considerations:      Lumbar Hardware: No RFA    Planned  Pending:      Under consideration:   Possible bilateral lumbar facet RFA  Diagnostic bilateral SI Blk #1  Possible bilateral SI RFA  Possible spinal cord stimulator trial    Completed:   Therapeutic right L2-3 LESI x1 (08/29/2020)  Diagnostic left lumbar facet MBB x2 (01/15/2022) (100/100/75/75)  Diagnostic right lumbar facet MBB x1 (01/15/2022) (100/100/75/75)  Diagnostic left SI Blk x1 (03/21/2020)  Therapeutic/palliative right lateral epicondyle (elbow) inj. x2 (01/21/2021)  Therapeutic/palliative left RACZ procedure x7 (targeting the left S2, S3 area) (12/18/2021) (0/0/50/90) complete relief of the radicular component.  No  benefit to the back pain. Therapeutic left RACZ procedure #8 (10/01/2022) (+ saddle block - NO HYPERTONIC injected) (100/100/90/95)    Therapeutic  Palliative (PRN) options:   Palliative right lateral epicondyle (elbow) inj. #2  Palliative left RACZ procedure #8 (targeting the left S2, S3 area)    Pharmacotherapy  Nonopioids transferred 05/02/2020: Zanaflex, Flexeril, and Mobic        Recent Visits Date Type Provider Dept  10/15/22 Office Visit Delano Metz, MD [facility]  Showing recent visits within past 90 days and meeting all other requirements Future Appointments Date Type Provider Dept  01/06/23 Appointment Delano Metz, MD [facility]  Showing future appointments within next 90 days and meeting all other requirements  I discussed the assessment and treatment plan with the patient. The patient was provided an opportunity to ask questions and all were answered. The patient agreed with the plan and demonstrated an understanding of the instructions.  Patient advised to call back or seek an in-person evaluation if the symptoms or condition worsens.  Duration of encounter: *** minutes.  Total time on encounter, as per AMA guidelines included both the face-to-face and non-face-to-face time personally spent by the physician and/or other qualified health care professional(s) on the day of the encounter (includes time in activities that require the physician or other qualified health care professional and does not include time in activities normally performed by clinical staff). Physician's time may include the following activities when performed: Preparing to see the patient (e.g., pre-charting review of records, searching for previously ordered imaging, lab work, and nerve conduction tests) Review of prior analgesic pharmacotherapies. Reviewing PMP Interpreting ordered tests (e.g., lab work, imaging, nerve conduction tests) Performing post-procedure  evaluations, including interpretation of diagnostic procedures Obtaining and/or reviewing separately obtained history Performing a medically appropriate examination and/or evaluation Counseling and educating the patient/family/caregiver Ordering medications, tests, or procedures Referring and communicating with other health care professionals (when not separately reported) Documenting clinical information in the electronic or other health record Independently interpreting results (not separately reported) and communicating results to the patient/ family/caregiver Care coordination (not separately reported)  Note by: Oswaldo Done, MD Date: 01/06/2023; Time: 6:01 AM

## 2023-01-06 ENCOUNTER — Ambulatory Visit (HOSPITAL_BASED_OUTPATIENT_CLINIC_OR_DEPARTMENT_OTHER): Payer: BC Managed Care – PPO | Admitting: Pain Medicine

## 2023-01-06 DIAGNOSIS — Z91199 Patient's noncompliance with other medical treatment and regimen due to unspecified reason: Secondary | ICD-10-CM

## 2023-01-06 NOTE — Patient Instructions (Signed)
____________________________________________________________________________________________  Opioid Pain Medication Update  To: All patients taking opioid pain medications. (I.e.: hydrocodone, hydromorphone, oxycodone, oxymorphone, morphine, codeine, methadone, tapentadol, tramadol, buprenorphine, fentanyl, etc.)  Re: Updated review of side effects and adverse reactions of opioid analgesics, as well as new information about long term effects of this class of medications.  Direct risks of long-term opioid therapy are not limited to opioid addiction and overdose. Potential medical risks include serious fractures, breathing problems during sleep, hyperalgesia, immunosuppression, chronic constipation, bowel obstruction, myocardial infarction, and tooth decay secondary to xerostomia.  Unpredictable adverse effects that can occur even if you take your medication correctly: Cognitive impairment, respiratory depression, and death. Most people think that if they take their medication "correctly", and "as instructed", that they will be safe. Nothing could be farther from the truth. In reality, a significant amount of recorded deaths associated with the use of opioids has occurred in individuals that had taken the medication for a long time, and were taking their medication correctly. The following are examples of how this can happen: Patient taking his/her medication for a long time, as instructed, without any side effects, is given a certain antibiotic or another unrelated medication, which in turn triggers a "Drug-to-drug interaction" leading to disorientation, cognitive impairment, impaired reflexes, respiratory depression or an untoward event leading to serious bodily harm or injury, including death.  Patient taking his/her medication for a long time, as instructed, without any side effects, develops an acute impairment of liver and/or kidney function. This will lead to a rapid inability of the body to  breakdown and eliminate their pain medication, which will result in effects similar to an "overdose", but with the same medicine and dose that they had always taken. This again may lead to disorientation, cognitive impairment, impaired reflexes, respiratory depression or an untoward event leading to serious bodily harm or injury, including death.  A similar problem will occur with patients as they grow older and their liver and kidney function begins to decrease as part of the aging process.  Background information: Historically, the original case for using long-term opioid therapy to treat chronic noncancer pain was based on safety assumptions that subsequent experience has called into question. In 1996, the American Pain Society and the American Academy of Pain Medicine issued a consensus statement supporting long-term opioid therapy. This statement acknowledged the dangers of opioid prescribing but concluded that the risk for addiction was low; respiratory depression induced by opioids was short-lived, occurred mainly in opioid-naive patients, and was antagonized by pain; tolerance was not a common problem; and efforts to control diversion should not constrain opioid prescribing. This has now proven to be wrong. Experience regarding the risks for opioid addiction, misuse, and overdose in community practice has failed to support these assumptions.  According to the Centers for Disease Control and Prevention, fatal overdoses involving opioid analgesics have increased sharply over the past decade. Currently, more than 96,700 people die from drug overdoses every year. Opioids are a factor in 7 out of every 10 overdose deaths. Deaths from drug overdose have surpassed motor vehicle accidents as the leading cause of death for individuals between the ages of 35 and 54.  Clinical data suggest that neuroendocrine dysfunction may be very common in both men and women, potentially causing hypogonadism, erectile  dysfunction, infertility, decreased libido, osteoporosis, and depression. Recent studies linked higher opioid dose to increased opioid-related mortality. Controlled observational studies reported that long-term opioid therapy may be associated with increased risk for cardiovascular events. Subsequent meta-analysis concluded   that the safety of long-term opioid therapy in elderly patients has not been proven.   Side Effects and adverse reactions: Common side effects: Drowsiness (sedation). Dizziness. Nausea and vomiting. Constipation. Physical dependence -- Dependence often manifests with withdrawal symptoms when opioids are discontinued or decreased. Tolerance -- As you take repeated doses of opioids, you require increased medication to experience the same effect of pain relief. Respiratory depression -- This can occur in healthy people, especially with higher doses. However, people with COPD, asthma or other lung conditions may be even more susceptible to fatal respiratory impairment.  Uncommon side effects: An increased sensitivity to feeling pain and extreme response to pain (hyperalgesia). Chronic use of opioids can lead to this. Delayed gastric emptying (the process by which the contents of your stomach are moved into your small intestine). Muscle rigidity. Immune system and hormonal dysfunction. Quick, involuntary muscle jerks (myoclonus). Arrhythmia. Itchy skin (pruritus). Dry mouth (xerostomia).  Long-term side effects: Chronic constipation. Sleep-disordered breathing (SDB). Increased risk of bone fractures. Hypothalamic-pituitary-adrenal dysregulation. Increased risk of overdose.  RISKS: Fractures and Falls:  Opioids increase the risk and incidence of falls. This is of particular importance in elderly patients.  Endocrine System:  Long-term administration is associated with endocrine abnormalities (endocrinopathies). (Also known as Opioid-induced Endocrinopathy) Influences  on both the hypothalamic-pituitary-adrenal axis?and the hypothalamic-pituitary-gonadal axis have been demonstrated with consequent hypogonadism and adrenal insufficiency in both sexes. Hypogonadism and decreased levels of dehydroepiandrosterone sulfate have been reported in men and women. Endocrine effects include: Amenorrhoea in women (abnormal absence of menstruation) Reduced libido in both sexes Decreased sexual function Erectile dysfunction in men Hypogonadisms (decreased testicular function with shrinkage of testicles) Infertility Depression and fatigue Loss of muscle mass Anxiety Depression Immune suppression Hyperalgesia Weight gain Anemia Osteoporosis Patients (particularly women of childbearing age) should avoid opioids. There is insufficient evidence to recommend routine monitoring of asymptomatic patients taking opioids in the long-term for hormonal deficiencies.  Immune System: Human studies have demonstrated that opioids have an immunomodulating effect. These effects are mediated via opioid receptors both on immune effector cells and in the central nervous system. Opioids have been demonstrated to have adverse effects on antimicrobial response and anti-tumour surveillance. Buprenorphine has been demonstrated to have no impact on immune function.  Opioid Induced Hyperalgesia: Human studies have demonstrated that prolonged use of opioids can lead to a state of abnormal pain sensitivity, sometimes called opioid induced hyperalgesia (OIH). Opioid induced hyperalgesia is not usually seen in the absence of tolerance to opioid analgesia. Clinically, hyperalgesia may be diagnosed if the patient on long-term opioid therapy presents with increased pain. This might be qualitatively and anatomically distinct from pain related to disease progression or to breakthrough pain resulting from development of opioid tolerance. Pain associated with hyperalgesia tends to be more diffuse than the  pre-existing pain and less defined in quality. Management of opioid induced hyperalgesia requires opioid dose reduction.  Cancer: Chronic opioid therapy has been associated with an increased risk of cancer among noncancer patients with chronic pain. This association was more evident in chronic strong opioid users. Chronic opioid consumption causes significant pathological changes in the small intestine and colon. Epidemiological studies have found that there is a link between opium dependence and initiation of gastrointestinal cancers. Cancer is the second leading cause of death after cardiovascular disease. Chronic use of opioids can cause multiple conditions such as GERD, immunosuppression and renal damage as well as carcinogenic effects, which are associated with the incidence of cancers.   Mortality: Long-term opioid use   has been associated with increased mortality among patients with chronic non-cancer pain (CNCP).  Prescription of long-acting opioids for chronic noncancer pain was associated with a significantly increased risk of all-cause mortality, including deaths from causes other than overdose.  Reference: Von Korff M, Kolodny A, Deyo RA, Chou R. Long-term opioid therapy reconsidered. Ann Intern Med. 2011 Sep 6;155(5):325-8. doi: 10.7326/[id number]. PMID: 21893626; PMCID: PMC3280085. Bedson J, Chen Y, Ashworth J, Hayward RA, Dunn KM, Jordan KP. Risk of adverse events in patients prescribed long-term opioids: A cohort study in the [place] Clinical Practice Research Datalink. Eur J Pain. 2019 May;23(5):908-922. doi: 10.1002/ejp.1357. Epub 2019 Jan 31. PMID: 30620116. Colameco S, Coren JS, Ciervo CA. Continuous opioid treatment for chronic noncancer pain: a time for moderation in prescribing. Postgrad Med. 2009 Jul;121(4):61-6. doi: 10.3810/pgm.2009.07.2032. PMID: 19641271. Chou R, Turner JA, Devine EB, Hansen RN, Sullivan SD, Blazina I, Dana T, Bougatsos C, Deyo RA. The  effectiveness and risks of long-term opioid therapy for chronic pain: a systematic review for a National Institutes of Health Pathways to Prevention Workshop. Ann Intern Med. 2015 Feb 17;162(4):276-86. doi: 10.7326/M14-2559. PMID: 25581257. Warner M, Chen LH, Makuc DM. NCHS Data Brief No. 22. [place]: Centers for Disease Control and Prevention; 2009. Sep, Increase in Fatal Poisonings Involving Opioid Analgesics in the [place], 1999-2006. Song IA, Choi HR, Oh TK. Long-term opioid use and mortality in patients with chronic non-cancer pain: Ten-year follow-up study in [place] from 2010 through 2019. EClinicalMedicine. 2022 Jul 18;51:101558. doi: 10.1016/j.eclinm.2022.101558. PMID: 35875817; PMCID: PMC9304910. Huser, W., Schubert, T., Vogelmann, T. et al. All-cause mortality in patients with long-term opioid therapy compared with non-opioid analgesics for chronic non-cancer pain: a database study. BMC Med 18, 162 (2020). https://doi.org/10.1186/[email or web address] Rashidian H, Zendehdel K, Kamangar F, Malekzadeh R, Haghdoost AA. An Ecological Study of the Association between Opiate Use and Incidence of Cancers. Addict Health. 2016 Fall;8(4):252-260. PMID: 28819556; PMCID: PMC5554805.  Our Goal: Our goal is to control your pain with means other than the use of opioid pain medications.  Our Recommendation: Talk to your physician about coming off of these medications. We can assist you with the tapering down and stopping these medicines. Based on the new information, even if you cannot completely stop the medication, a decrease in the dose may be associated with a lesser risk. Ask for other means of controlling the pain. Decrease or eliminate those factors that significantly contribute to your pain such as smoking, obesity, and a diet heavily tilted towards "inflammatory" nutrients.  Last Updated: 09/02/2022    ____________________________________________________________________________________________     ____________________________________________________________________________________________  Transfer of Pain Medication between Pharmacies  Re: 2023 DEA Clarification on existing regulation  Published on DEA Website: March 06, 2022  Title: Revised Regulation Allows DEA-Registered Pharmacies to Transfer Electronic Prescriptions at a Patient's Request DEA Headquarters Division - Public Information Office  "Patients now have the ability to request their electronic prescription be transferred to another pharmacy without having to go back to their practitioner to initiate the request. This revised regulation went into effect on Monday, March 02, 2022.     At a patient's request, a DEA-registered retail pharmacy can now transfer an electronic prescription for a controlled substance (schedules II-V) to another DEA-registered retail pharmacy. Prior to this change, patients would have to go through their practitioner to cancel their prescription and have it re-issued to a different pharmacy. The process was taxing and time consuming for both patients and practitioners.    The Drug Enforcement Administration (DEA) published its intent to revise the   process for transferring electronic prescriptions on May 24, 2020.  The final rule was published in the federal register on January 29, 2022 and went into effect 30 days later.  Under the final rule, a prescription can only be transferred once between pharmacies, and only if allowed under existing state or other applicable law. The prescription must remain in its electronic form; may not be altered in any way; and the transfer must be communicated directly between two licensed pharmacists. It's important to note, any authorized refills transfer with the original prescription, which means the entire prescription will be filled at the same pharmacy."     REFERENCES: 1. DEA website announcement https://www.dea.gov/stories/2023/2023-03/2022-09-01/revised-regulation-allows-dea-registered-pharmacies-transfer  2. Department of Justice website  https://www.govinfo.gov/content/pkg/FR-2022-01-29/pdf/2023-15847.pdf  3. DEPARTMENT OF JUSTICE Drug Enforcement Administration 21 CFR Part 1306 [Docket No. DEA-637] RIN [id number] "Transfer of Electronic Prescriptions for Schedules II-V Controlled Substances Between Pharmacies for Initial Filling"  ____________________________________________________________________________________________     _______________________________________________________________________  Medication Rules  Purpose: To inform patients, and their family members, of our medication rules and regulations.  Applies to: All patients receiving prescriptions from our practice (written or electronic).  Pharmacy of record: This is the pharmacy where your electronic prescriptions will be sent. Make sure we have the correct one.  Electronic prescriptions: In compliance with the [place] Strengthen Opioid Misuse Prevention (STOP) Act of 2017 (Session Law 2017-74/H243), effective July 06, 2018, all controlled substances must be electronically prescribed. Written prescriptions, faxing, or calling prescriptions to a pharmacy will no longer be done.  Prescription refills: These will be provided only during in-person appointments. No medications will be renewed without a "face-to-face" evaluation with your provider. Applies to all prescriptions.  NOTE: The following applies primarily to controlled substances (Opioid* Pain Medications).   Type of encounter (visit): For patients receiving controlled substances, face-to-face visits are required. (Not an option and not up to the patient.)  Patient's responsibilities: Pain Pills: Bring all pain pills to every appointment (except for procedure appointments). Pill Bottles: Bring  pills in original pharmacy bottle. Bring bottle, even if empty. Always bring the bottle of the most recent fill.  Medication refills: You are responsible for knowing and keeping track of what medications you are taking and when is it that you will need a refill. The day before your appointment: write a list of all prescriptions that need to be refilled. The day of the appointment: give the list to the admitting nurse. Prescriptions will be written only during appointments. No prescriptions will be written on procedure days. If you forget a medication: it will not be "Called in", "Faxed", or "electronically sent". You will need to get another appointment to get these prescribed. No early refills. Do not call asking to have your prescription filled early. Partial  or short prescriptions: Occasionally your pharmacy may not have enough pills to fill your prescription.  NEVER ACCEPT a partial fill or a prescription that is short of the total amount of pills that you were prescribed.  With controlled substances the law allows 72 hours for the pharmacy to complete the prescription.  If the prescription is not completed within 72 hours, the pharmacist will require a new prescription to be written. This means that you will be short on your medicine and we WILL NOT send another prescription to complete your original prescription.  Instead, request the pharmacy to send a carrier to a nearby branch to get enough medication to provide you with your full prescription. Prescription Accuracy: You are responsible for carefully inspecting your   prescriptions before leaving our office. Have the discharge nurse carefully go over each prescription with you, before taking them home. Make sure that your name is accurately spelled, that your address is correct. Check the name and dose of your medication to make sure it is accurate. Check the number of pills, and the written instructions to make sure they are clear and accurate. Make  sure that you are given enough medication to last until your next medication refill appointment. Taking Medication: Take medication as prescribed. When it comes to controlled substances, taking less pills or less frequently than prescribed is permitted and encouraged. Never take more pills than instructed. Never take the medication more frequently than prescribed.  Inform other Doctors: Always inform, all of your healthcare providers, of all the medications you take. Pain Medication from other Providers: You are not allowed to accept any additional pain medication from any other Doctor or Healthcare provider. There are two exceptions to this rule. (see below) In the event that you require additional pain medication, you are responsible for notifying us, as stated below. Cough Medicine: Often these contain an opioid, such as codeine or hydrocodone. Never accept or take cough medicine containing these opioids if you are already taking an opioid* medication. The combination may cause respiratory failure and death. Medication Agreement: You are responsible for carefully reading and following our Medication Agreement. This must be signed before receiving any prescriptions from our practice. Safely store a copy of your signed Agreement. Violations to the Agreement will result in no further prescriptions. (Additional copies of our Medication Agreement are available upon request.) Laws, Rules, & Regulations: All patients are expected to follow all Federal and State Laws, Statutes, Rules, & Regulations. Ignorance of the Laws does not constitute a valid excuse.  Illegal drugs and Controlled Substances: The use of illegal substances (including, but not limited to marijuana and its derivatives) and/or the illegal use of any controlled substances is strictly prohibited. Violation of this rule may result in the immediate and permanent discontinuation of any and all prescriptions being written by our practice. The use of  any illegal substances is prohibited. Adopted CDC guidelines & recommendations: Target dosing levels will be at or below 60 MME/day. Use of benzodiazepines** is not recommended.  Exceptions: There are only two exceptions to the rule of not receiving pain medications from other Healthcare Providers. Exception #1 (Emergencies): In the event of an emergency (i.e.: accident requiring emergency care), you are allowed to receive additional pain medication. However, you are responsible for: As soon as you are able, call our office [phone], at any time of the day or night, and leave a message stating your name, the date and nature of the emergency, and the name and dose of the medication prescribed. In the event that your call is answered by a member of our staff, make sure to document and save the date, time, and the name of the person that took your information.  Exception #2 (Planned Surgery): In the event that you are scheduled by another doctor or dentist to have any type of surgery or procedure, you are allowed (for a period no longer than 30 days), to receive additional pain medication, for the acute post-op pain. However, in this case, you are responsible for picking up a copy of our "Post-op Pain Management for Surgeons" handout, and giving it to your surgeon or dentist. This document is available at our office, and does not require an appointment to obtain it. Simply go to   our office during business hours (Monday-Thursday from 8:00 AM to 4:00 PM) (Friday 8:00 AM to 12:00 Noon) or if you have a scheduled appointment with us, prior to your surgery, and ask for it by name. In addition, you are responsible for: calling our office [phone], at any time of the day or night, and leaving a message stating your name, name of your surgeon, type of surgery, and date of procedure or surgery. Failure to comply with your responsibilities may result in termination of therapy involving the controlled  substances. Medication Agreement Violation. Following the above rules, including your responsibilities will help you in avoiding a Medication Agreement Violation ("Breaking your Pain Medication Contract").  Consequences:  Not following the above rules may result in permanent discontinuation of medication prescription therapy.  *Opioid medications include: morphine, codeine, oxycodone, oxymorphone, hydrocodone, hydromorphone, meperidine, tramadol, tapentadol, buprenorphine, fentanyl, methadone. **Benzodiazepine medications include: diazepam (Valium), alprazolam (Xanax), clonazepam (Klonopine), lorazepam (Ativan), clorazepate (Tranxene), chlordiazepoxide (Librium), estazolam (Prosom), oxazepam (Serax), temazepam (Restoril), triazolam (Halcion) (Last updated: 04/28/2022) ______________________________________________________________________    ______________________________________________________________________  Medication Recommendations and Reminders  Applies to: All patients receiving prescriptions (written and/or electronic).  Medication Rules & Regulations: You are responsible for reading, knowing, and following our "Medication Rules" document. These exist for your safety and that of others. They are not flexible and neither are we. Dismissing or ignoring them is an act of "non-compliance" that may result in complete and irreversible termination of such medication therapy. For safety reasons, "non-compliance" will not be tolerated. As with the [place] fundamental legal principle of "ignorance of the law is no defense", we will accept no excuses for not having read and knowing the content of documents provided to you by our practice.  Pharmacy of record:  Definition: This is the pharmacy where your electronic prescriptions will be sent.  We do not endorse any particular pharmacy. It is up to you and your insurance to decide what pharmacy to use.  We do not restrict you in your choice of  pharmacy. However, once we write for your prescriptions, we will NOT be re-sending more prescriptions to fix restricted supply problems created by your pharmacy, or your insurance.  The pharmacy listed in the electronic medical record should be the one where you want electronic prescriptions to be sent. If you choose to change pharmacy, simply notify our nursing staff. Changes will be made only during your regular appointments and not over the phone.  Recommendations: Keep all of your pain medications in a safe place, under lock and key, even if you live alone. We will NOT replace lost, stolen, or damaged medication. We do not accept "Police Reports" as proof of medications having been stolen. After you fill your prescription, take 1 week's worth of pills and put them away in a safe place. You should keep a separate, properly labeled bottle for this purpose. The remainder should be kept in the original bottle. Use this as your primary supply, until it runs out. Once it's gone, then you know that you have 1 week's worth of medicine, and it is time to come in for a prescription refill. If you do this correctly, it is unlikely that you will ever run out of medicine. To make sure that the above recommendation works, it is very important that you make sure your medication refill appointments are scheduled at least 1 week before you run out of medicine. To do this in an effective manner, make sure that you do not leave the office without   scheduling your next medication management appointment. Always ask the nursing staff to show you in your prescription , when your medication will be running out. Then arrange for the receptionist to get you a return appointment, at least 7 days before you run out of medicine. Do not wait until you have 1 or 2 pills left, to come in. This is very poor planning and does not take into consideration that we may need to cancel appointments due to bad weather, sickness, or emergencies  affecting our staff. DO NOT ACCEPT A "Partial Fill": If for any reason your pharmacy does not have enough pills/tablets to completely fill or refill your prescription, do not allow for a "partial fill". The law allows the pharmacy to complete that prescription within 72 hours, without requiring a new prescription. If they do not fill the rest of your prescription within those 72 hours, you will need a separate prescription to fill the remaining amount, which we will NOT provide. If the reason for the partial fill is your insurance, you will need to talk to the pharmacist about payment alternatives for the remaining tablets, but again, DO NOT ACCEPT A PARTIAL FILL, unless you can trust your pharmacist to obtain the remainder of the pills within 72 hours.  Prescription refills and/or changes in medication(s):  Prescription refills, and/or changes in dose or medication, will be conducted only during scheduled medication management appointments. (Applies to both, written and electronic prescriptions.) No refills on procedure days. No medication will be changed or started on procedure days. No changes, adjustments, and/or refills will be conducted on a procedure day. Doing so will interfere with the diagnostic portion of the procedure. No phone refills. No medications will be "called into the pharmacy". No Fax refills. No weekend refills. No Holliday refills. No after hours refills.  Remember:  Business hours are:  Monday to Thursday 8:00 AM to 4:00 PM Provider's Schedule: Hong Timm, MD - Appointments are:  Medication management: Monday and Wednesday 8:00 AM to 4:00 PM Procedure day: Tuesday and Thursday 7:30 AM to 4:00 PM Bilal Lateef, MD - Appointments are:  Medication management: Tuesday and Thursday 8:00 AM to 4:00 PM Procedure day: Monday and Wednesday 7:30 AM to 4:00 PM (Last update: 04/28/2022) ______________________________________________________________________    ____________________________________________________________________________________________  Naloxone Nasal Spray  Why am I receiving this medication? [place] STOP ACT requires that all patients taking high dose opioids or at risk of opioids respiratory depression, be prescribed an opioid reversal agent, such as Naloxone (AKA: Narcan).  What is this medication? NALOXONE (nal OX one) treats opioid overdose, which causes slow or shallow breathing, severe drowsiness, or trouble staying awake. Call emergency services after using this medication. You may need additional treatment. Naloxone works by reversing the effects of opioids. It belongs to a group of medications called opioid blockers.  COMMON BRAND NAME(S): Kloxxado, Narcan  What should I tell my care team before I take this medication? They need to know if you have any of these conditions: Heart disease Substance use disorder An unusual or allergic reaction to naloxone, other medications, foods, dyes, or preservatives Pregnant or trying to get pregnant Breast-feeding  When to use this medication? This medication is to be used for the treatment of respiratory depression (less than 8 breaths per minute) secondary to opioid overdose.   How to use this medication? This medication is for use in the nose. Lay the person on their back. Support their neck with your hand and allow the head to tilt   back before giving the medication. The nasal spray should be given into 1 nostril. After giving the medication, move the person onto their side. Do not remove or test the nasal spray until ready to use. Get emergency medical help right away after giving the first dose of this medication, even if the person wakes up. You should be familiar with how to recognize the signs and symptoms of a narcotic overdose. If more doses are needed, give the additional dose in the other nostril. Talk to your care team about the use of this medication in children.  While this medication may be prescribed for children as young as newborns for selected conditions, precautions do apply.  Naloxone Overdosage: If you think you have taken too much of this medicine contact a poison control center or emergency room at once.  NOTE: This medicine is only for you. Do not share this medicine with others.  What if I miss a dose? This does not apply.  What may interact with this medication? This is only used during an emergency. No interactions are expected during emergency use. This list may not describe all possible interactions. Give your health care provider a list of all the medicines, herbs, non-prescription drugs, or dietary supplements you use. Also tell them if you smoke, drink alcohol, or use illegal drugs. Some items may interact with your medicine.  What should I watch for while using this medication? Keep this medication ready for use in the case of an opioid overdose. Make sure that you have the phone number of your care team and local hospital ready. You may need to have additional doses of this medication. Each nasal spray contains a single dose. Some emergencies may require additional doses. After use, bring the treated person to the nearest hospital or call 911. Make sure the treating care team knows that the person has received a dose of this medication. You will receive additional instructions on what to do during and after use of this medication before an emergency occurs.  What side effects may I notice from receiving this medication? Side effects that you should report to your care team as soon as possible: Allergic reactions--skin rash, itching, hives, swelling of the face, lips, tongue, or throat Side effects that usually do not require medical attention (report these to your care team if they continue or are bothersome): Constipation Dryness or irritation inside the nose Headache Increase in blood pressure Muscle spasms Stuffy  nose Toothache This list may not describe all possible side effects. Call your doctor for medical advice about side effects. You may report side effects to FDA at 1-800-FDA-1088.  Where should I keep my medication? Because this is an emergency medication, you should keep it with you at all times.  Keep out of the reach of children and pets. Store between 20 and 25 degrees C (68 and 77 degrees F). Do not freeze. Throw away any unused medication after the expiration date. Keep in original box until ready to use.  NOTE: This sheet is a summary. It may not cover all possible information. If you have questions about this medicine, talk to your doctor, pharmacist, or health care provider.   2023 Elsevier/Gold Standard (2021-02-28 00:00:00)  ____________________________________________________________________________________________   

## 2023-01-10 NOTE — Progress Notes (Unsigned)
PROVIDER NOTE: Information contained herein reflects review and annotations entered in association with encounter. Interpretation of such information and data should be left to medically-trained personnel. Information provided to patient can be located elsewhere in the medical record under "Patient Instructions". Document created using STT-dictation technology, any transcriptional errors that may result from process are unintentional.    Patient: Derek [name]  Service Category: E/M  Provider: Oswaldo Done, MD  DOB: 10/14/1980  DOS: 01/11/2023  Referring Provider: Wilford Corner  MRN: [id number]  Specialty: Interventional Pain Management  PCP: Wilford Corner, PA-C  Type: Established Patient  Setting: Ambulatory outpatient    Location: Office  Delivery: Face-to-face     HPI  [name], a 42 y.o. year old male, is here today because of his Chronic pain syndrome [G89.4]. Derek [name] primary complain today is No chief complaint on file.  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); Somatic dysfunction of sacroiliac joints (Bilateral); Chronic hip pain (Bilateral); Epidural fibrosis; Lumbar postlaminectomy syndrome; DDD (degenerative disc disease), lumbosacral; Lumbar facet syndrome (Bilateral) (L>R); Other specified dorsopathies, sacral and sacrococcygeal region; Spondylosis without myelopathy or radiculopathy, lumbosacral region; Neurogenic pain; Osteoarthritic spondylosis of lumbar spine; Tendinitis of elbows (Bilateral); Lateral epicondylitis of elbows (Bilateral); Chronic elbow pain  (Right); Chronic low back pain (Left) w/o sciatica; Numbness of anterior thigh (Right); Burning pain in thigh (Right); Chronic low back pain (Midline) w/o sciatica; Acute exacerbation of chronic low back pain; Chronic elbow pain (Bilateral); Radial collateral ligament sprain of elbow, unspecified laterality, sequela (Bilateral); Enthesopathy of elbow region (Bilateral); Sprain of lateral collateral ligament of elbow, sequela (Left); Sprain of lateral collateral ligament of elbow, sequela (Right); Radicular pain of lumbosacral region; Lumbosacral radiculopathy; and Chronic low back pain (Bilateral) w/o sciatica on their pertinent problem list. Pain Assessment: Severity of   is reported as a  /10. Location:    / . Onset:  . Quality:  . Timing:  . Modifying factor(s):  Marland Kitchen Vitals:  vitals were not taken for this visit.  BMI: Estimated body mass index is 24.97 kg/m as calculated from the following:   Height as of 10/01/22: 5\' 10"  (1.778 m).   Weight as of 10/01/22: 174 lb (78.9 kg). Last encounter: 01/06/2023. Last procedure: 10/01/2022.  Reason for encounter: medication management. ***  Routine UDS ordered today.   RTCB: 04/11/2023   Pharmacotherapy Assessment  Analgesic: Oxycodone IR 10 mg, 1 tab PO q 6 hrs (40 mg/day of oxycodone) MME/day: 60 mg/day.   Monitoring: [place] PMP: PDMP reviewed during this encounter.       Pharmacotherapy: No side-effects or adverse reactions reported. Compliance: No problems identified. Effectiveness: Clinically acceptable.  No notes on file  No results found for: "CBDTHCR" No results found for: "D8THCCBX" No results found for: "D9THCCBX"  UDS:  Summary  Date Value Ref Range Status  02/02/2022 Note  Final    Comment:    ==================================================================== [facility] (MW) ==================================================================== Test                             Result       Flag  Units  Drug Present and  Declared for Prescription Verification   Oxycodone                      2636         EXPECTED   ng/mg creat   Oxymorphone                    5162         EXPECTED   ng/mg creat   Noroxycodone                   3235         EXPECTED   ng/mg creat   Noroxymorphone                 1681         EXPECTED   ng/mg creat    Sources of oxycodone are scheduled prescription medications.    Oxymorphone, noroxycodone, and noroxymorphone are expected    metabolites of oxycodone. Oxymorphone is also available as a    scheduled prescription medication.  ==================================================================== Test                      Result    Flag   Units      Ref Range   Creatinine              165              mg/dL      >=29 ==================================================================== Declared Medications:  The flagging and interpretation on this report are based on the  following declared medications.  Unexpected results may arise from  inaccuracies in the declared medications.   **Note: The testing scope of this panel includes these medications:   Oxycodone   **Note: The testing scope of this panel does not include the  following reported medications:   Calcium  Cholecalciferol  Cyclobenzaprine  Diclofenac  Esomeprazole (Nexium)  Meloxicam (Mobic)  Tizanidine (Zanaflex) ==================================================================== For clinical consultation, please call [phone]. ====================================================================       ROS  Constitutional: Denies any fever or chills Gastrointestinal: No reported hemesis, hematochezia, vomiting, or acute GI distress Musculoskeletal: Denies any acute onset joint swelling, redness, loss of ROM, or weakness Neurological: No reported episodes of acute onset apraxia, aphasia, dysarthria, agnosia, amnesia, paralysis, loss of coordination, or loss of consciousness  Medication Review   Calcium Carb-Cholecalciferol, Diclofenac Sodium, Oxycodone HCl, celecoxib, cyclobenzaprine, esomeprazole, meloxicam, naloxone, and tiZANidine  History Review  Allergy: Derek [name] is allergic to hydrocodone, amoxicillin, and vicodin [hydrocodone-acetaminophen]. Drug: Mr. Salzberg  reports no history of drug use. Alcohol:  reports current alcohol use. Tobacco:  reports that he has been smoking cigarettes. He has a 16.00 pack-year smoking history. He has never used smokeless tobacco. Social: Mr. Salonen  reports that he has been smoking cigarettes. He has a 16.00 pack-year smoking history. He has never used smokeless tobacco. He reports current alcohol use. He reports that he does not use drugs. Medical:  has a past medical history of Anxiety, Arthritis, and GERD (gastroesophageal reflux disease). Surgical: Mr. Avila  has a past surgical history that includes Back surgery; HAND REIMPLANTED; and Lumbar fusion (11/14). Family: family history is not on file.  Laboratory Chemistry Profile   Renal Lab Results  Component Value Date   BUN 11 01/16/2019   CREATININE 1.08 01/16/2019   BCR 10 01/16/2019   GFRAA 99 01/16/2019   GFRNONAA 86 01/16/2019  Hepatic Lab Results  Component Value Date   AST 24 01/16/2019   ALBUMIN 4.6 01/16/2019   ALKPHOS 108 01/16/2019    Electrolytes Lab Results  Component Value Date   NA 143 01/16/2019   K 4.0 01/16/2019   CL 105 01/16/2019   CALCIUM 10.2 01/16/2019   MG 2.0 01/16/2019    Bone Lab Results  Component Value Date   25OHVITD1 39 01/16/2019   25OHVITD2 1.2 01/16/2019   25OHVITD3 38 01/16/2019    Inflammation (CRP: Acute Phase) (ESR: Chronic Phase) Lab Results  Component Value Date   CRP 4 01/16/2019   ESRSEDRATE 29 (H) 01/16/2019         Note: Above Lab results reviewed.  Recent Imaging Review  DG PAIN [facility]IN[facility] C-ARM 1-60 MIN NO REPORT Fluoro was used, but no Radiologist interpretation will be provided.  Please refer to "NOTES" tab for  provider progress note. Note: Reviewed        Physical Exam  General appearance: Well nourished, well developed, and well hydrated. In no apparent acute distress Mental status: Alert, oriented x 3 (person, place, & time)       Respiratory: No evidence of acute respiratory distress Eyes: PERLA Vitals: There were no vitals taken for this visit. BMI: Estimated body mass index is 24.97 kg/m as calculated from the following:   Height as of 10/01/22: 5\' 10"  (1.778 m).   Weight as of 10/01/22: 174 lb (78.9 kg). Ideal: Patient weight not recorded  Assessment   Diagnosis Status  1. Chronic pain syndrome   2. Chronic lower extremity pain (2ry area of Pain) (Bilateral) (L>R)   3. 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 Failed back surgical syndrome (x3)   5. DDD (degenerative disc disease), lumbosacral   6. Pharmacologic therapy   7. Chronic use of opiate for therapeutic purpose   8. Encounter for medication management   9. Encounter for chronic pain management    Controlled Controlled Controlled   Updated Problems: No problems updated.  Plan of Care  Problem-specific:  No problem-specific Assessment & Plan notes found for this encounter.  Mr. EDSON FRUTH has a current medication list which includes the following long-term medication(s): calcium carb-cholecalciferol, esomeprazole, meloxicam, oxycodone hcl, and tizanidine.  Pharmacotherapy (Medications Ordered): No orders of the defined types were placed in this encounter.  Orders:  No orders of the defined types were placed in this encounter.  Follow-up plan:   No follow-ups on file.      Interventional Therapies  Risk Factors  Considerations:      Lumbar Hardware: No RFA    Planned  Pending:      Under consideration:   Possible bilateral lumbar facet RFA  Diagnostic bilateral SI Blk #1  Possible bilateral SI RFA  Possible spinal cord stimulator trial     Completed:   Therapeutic right L2-3 LESI x1 (08/29/2020)  Diagnostic left lumbar facet MBB x2 (01/15/2022) (100/100/75/75)  Diagnostic right lumbar facet MBB x1 (01/15/2022) (100/100/75/75)  Diagnostic left SI Blk x1 (03/21/2020)  Therapeutic/palliative right lateral epicondyle (elbow) inj. x2 (01/21/2021)  Therapeutic/palliative left RACZ procedure x7 (targeting the left S2, S3 area) (12/18/2021) (0/0/50/90) complete relief of the radicular component.  No benefit to the back pain. Therapeutic left RACZ procedure #8 (10/01/2022) (+ saddle block - NO HYPERTONIC injected) (100/100/90/95)    Therapeutic  Palliative (PRN) options:   Palliative right lateral epicondyle (elbow) inj. #2  Palliative left RACZ procedure #  8 (targeting the left S2, S3 area)    Pharmacotherapy  Nonopioids transferred 05/02/2020: Zanaflex, Flexeril, and Mobic        Recent Visits Date Type Provider Dept  10/15/22 Office Visit Delano Metz, MD [facility]  Showing recent visits within past 90 days and meeting all other requirements Future Appointments Date Type Provider Dept  01/11/23 Appointment Delano Metz, MD [facility]  Showing future appointments within next 90 days and meeting all other requirements  I discussed the assessment and treatment plan with the patient. The patient was provided an opportunity to ask questions and all were answered. The patient agreed with the plan and demonstrated an understanding of the instructions.  Patient advised to call back or seek an in-person evaluation if the symptoms or condition worsens.  Duration of encounter: *** minutes.  Total time on encounter, as per AMA guidelines included both the face-to-face and non-face-to-face time personally spent by the physician and/or other qualified health care professional(s) on the day of the encounter (includes time in activities that require the physician or other qualified health care professional  and does not include time in activities normally performed by clinical staff). Physician's time may include the following activities when performed: Preparing to see the patient (e.g., pre-charting review of records, searching for previously ordered imaging, lab work, and nerve conduction tests) Review of prior analgesic pharmacotherapies. Reviewing PMP Interpreting ordered tests (e.g., lab work, imaging, nerve conduction tests) Performing post-procedure evaluations, including interpretation of diagnostic procedures Obtaining and/or reviewing separately obtained history Performing a medically appropriate examination and/or evaluation Counseling and educating the patient/family/caregiver Ordering medications, tests, or procedures Referring and communicating with other health care professionals (when not separately reported) Documenting clinical information in the electronic or other health record Independently interpreting results (not separately reported) and communicating results to the patient/ family/caregiver Care coordination (not separately reported)  Note by: Oswaldo Done, MD Date: 01/11/2023; Time: 2:12 PM

## 2023-01-10 NOTE — Patient Instructions (Signed)
____________________________________________________________________________________________  Opioid Pain Medication Update  To: All patients taking opioid pain medications. (I.e.: hydrocodone, hydromorphone, oxycodone, oxymorphone, morphine, codeine, methadone, tapentadol, tramadol, buprenorphine, fentanyl, etc.)  Re: Updated review of side effects and adverse reactions of opioid analgesics, as well as new information about long term effects of this class of medications.  Direct risks of long-term opioid therapy are not limited to opioid addiction and overdose. Potential medical risks include serious fractures, breathing problems during sleep, hyperalgesia, immunosuppression, chronic constipation, bowel obstruction, myocardial infarction, and tooth decay secondary to xerostomia.  Unpredictable adverse effects that can occur even if you take your medication correctly: Cognitive impairment, respiratory depression, and death. Most people think that if they take their medication "correctly", and "as instructed", that they will be safe. Nothing could be farther from the truth. In reality, a significant amount of recorded deaths associated with the use of opioids has occurred in individuals that had taken the medication for a long time, and were taking their medication correctly. The following are examples of how this can happen: Patient taking his/her medication for a long time, as instructed, without any side effects, is given a certain antibiotic or another unrelated medication, which in turn triggers a "Drug-to-drug interaction" leading to disorientation, cognitive impairment, impaired reflexes, respiratory depression or an untoward event leading to serious bodily harm or injury, including death.  Patient taking his/her medication for a long time, as instructed, without any side effects, develops an acute impairment of liver and/or kidney function. This will lead to a rapid inability of the body to  breakdown and eliminate their pain medication, which will result in effects similar to an "overdose", but with the same medicine and dose that they had always taken. This again may lead to disorientation, cognitive impairment, impaired reflexes, respiratory depression or an untoward event leading to serious bodily harm or injury, including death.  A similar problem will occur with patients as they grow older and their liver and kidney function begins to decrease as part of the aging process.  Background information: Historically, the original case for using long-term opioid therapy to treat chronic noncancer pain was based on safety assumptions that subsequent experience has called into question. In 1996, the American Pain Society and the American Academy of Pain Medicine issued a consensus statement supporting long-term opioid therapy. This statement acknowledged the dangers of opioid prescribing but concluded that the risk for addiction was low; respiratory depression induced by opioids was short-lived, occurred mainly in opioid-naive patients, and was antagonized by pain; tolerance was not a common problem; and efforts to control diversion should not constrain opioid prescribing. This has now proven to be wrong. Experience regarding the risks for opioid addiction, misuse, and overdose in community practice has failed to support these assumptions.  According to the Centers for Disease Control and Prevention, fatal overdoses involving opioid analgesics have increased sharply over the past decade. Currently, more than 96,700 people die from drug overdoses every year. Opioids are a factor in 7 out of every 10 overdose deaths. Deaths from drug overdose have surpassed motor vehicle accidents as the leading cause of death for individuals between the ages of 35 and 54.  Clinical data suggest that neuroendocrine dysfunction may be very common in both men and women, potentially causing hypogonadism, erectile  dysfunction, infertility, decreased libido, osteoporosis, and depression. Recent studies linked higher opioid dose to increased opioid-related mortality. Controlled observational studies reported that long-term opioid therapy may be associated with increased risk for cardiovascular events. Subsequent meta-analysis concluded   that the safety of long-term opioid therapy in elderly patients has not been proven.   Side Effects and adverse reactions: Common side effects: Drowsiness (sedation). Dizziness. Nausea and vomiting. Constipation. Physical dependence -- Dependence often manifests with withdrawal symptoms when opioids are discontinued or decreased. Tolerance -- As you take repeated doses of opioids, you require increased medication to experience the same effect of pain relief. Respiratory depression -- This can occur in healthy people, especially with higher doses. However, people with COPD, asthma or other lung conditions may be even more susceptible to fatal respiratory impairment.  Uncommon side effects: An increased sensitivity to feeling pain and extreme response to pain (hyperalgesia). Chronic use of opioids can lead to this. Delayed gastric emptying (the process by which the contents of your stomach are moved into your small intestine). Muscle rigidity. Immune system and hormonal dysfunction. Quick, involuntary muscle jerks (myoclonus). Arrhythmia. Itchy skin (pruritus). Dry mouth (xerostomia).  Long-term side effects: Chronic constipation. Sleep-disordered breathing (SDB). Increased risk of bone fractures. Hypothalamic-pituitary-adrenal dysregulation. Increased risk of overdose.  RISKS: Fractures and Falls:  Opioids increase the risk and incidence of falls. This is of particular importance in elderly patients.  Endocrine System:  Long-term administration is associated with endocrine abnormalities (endocrinopathies). (Also known as Opioid-induced Endocrinopathy) Influences  on both the hypothalamic-pituitary-adrenal axis?and the hypothalamic-pituitary-gonadal axis have been demonstrated with consequent hypogonadism and adrenal insufficiency in both sexes. Hypogonadism and decreased levels of dehydroepiandrosterone sulfate have been reported in men and women. Endocrine effects include: Amenorrhoea in women (abnormal absence of menstruation) Reduced libido in both sexes Decreased sexual function Erectile dysfunction in men Hypogonadisms (decreased testicular function with shrinkage of testicles) Infertility Depression and fatigue Loss of muscle mass Anxiety Depression Immune suppression Hyperalgesia Weight gain Anemia Osteoporosis Patients (particularly women of childbearing age) should avoid opioids. There is insufficient evidence to recommend routine monitoring of asymptomatic patients taking opioids in the long-term for hormonal deficiencies.  Immune System: Human studies have demonstrated that opioids have an immunomodulating effect. These effects are mediated via opioid receptors both on immune effector cells and in the central nervous system. Opioids have been demonstrated to have adverse effects on antimicrobial response and anti-tumour surveillance. Buprenorphine has been demonstrated to have no impact on immune function.  Opioid Induced Hyperalgesia: Human studies have demonstrated that prolonged use of opioids can lead to a state of abnormal pain sensitivity, sometimes called opioid induced hyperalgesia (OIH). Opioid induced hyperalgesia is not usually seen in the absence of tolerance to opioid analgesia. Clinically, hyperalgesia may be diagnosed if the patient on long-term opioid therapy presents with increased pain. This might be qualitatively and anatomically distinct from pain related to disease progression or to breakthrough pain resulting from development of opioid tolerance. Pain associated with hyperalgesia tends to be more diffuse than the  pre-existing pain and less defined in quality. Management of opioid induced hyperalgesia requires opioid dose reduction.  Cancer: Chronic opioid therapy has been associated with an increased risk of cancer among noncancer patients with chronic pain. This association was more evident in chronic strong opioid users. Chronic opioid consumption causes significant pathological changes in the small intestine and colon. Epidemiological studies have found that there is a link between opium dependence and initiation of gastrointestinal cancers. Cancer is the second leading cause of death after cardiovascular disease. Chronic use of opioids can cause multiple conditions such as GERD, immunosuppression and renal damage as well as carcinogenic effects, which are associated with the incidence of cancers.   Mortality: Long-term opioid use   has been associated with increased mortality among patients with chronic non-cancer pain (CNCP).  Prescription of long-acting opioids for chronic noncancer pain was associated with a significantly increased risk of all-cause mortality, including deaths from causes other than overdose.  Reference: Von Korff M, Kolodny A, Deyo RA, Chou R. Long-term opioid therapy reconsidered. Ann Intern Med. 2011 Sep 6;155(5):325-8. doi: 10.7326/[id number]. PMID: 21893626; PMCID: PMC3280085. Bedson J, Chen Y, Ashworth J, Hayward RA, Dunn KM, Jordan KP. Risk of adverse events in patients prescribed long-term opioids: A cohort study in the [place] Clinical Practice Research Datalink. Eur J Pain. 2019 May;23(5):908-922. doi: 10.1002/ejp.1357. Epub 2019 Jan 31. PMID: 30620116. Colameco S, Coren JS, Ciervo CA. Continuous opioid treatment for chronic noncancer pain: a time for moderation in prescribing. Postgrad Med. 2009 Jul;121(4):61-6. doi: 10.3810/pgm.2009.07.2032. PMID: 19641271. Chou R, Turner JA, Devine EB, Hansen RN, Sullivan SD, Blazina I, Dana T, Bougatsos C, Deyo RA. The  effectiveness and risks of long-term opioid therapy for chronic pain: a systematic review for a National Institutes of Health Pathways to Prevention Workshop. Ann Intern Med. 2015 Feb 17;162(4):276-86. doi: 10.7326/M14-2559. PMID: 25581257. Warner M, Chen LH, Makuc DM. NCHS Data Brief No. 22. [place]: Centers for Disease Control and Prevention; 2009. Sep, Increase in Fatal Poisonings Involving Opioid Analgesics in the [place], 1999-2006. Song IA, Choi HR, Oh TK. Long-term opioid use and mortality in patients with chronic non-cancer pain: Ten-year follow-up study in [place] from 2010 through 2019. EClinicalMedicine. 2022 Jul 18;51:101558. doi: 10.1016/j.eclinm.2022.101558. PMID: 35875817; PMCID: PMC9304910. Huser, W., Schubert, T., Vogelmann, T. et al. All-cause mortality in patients with long-term opioid therapy compared with non-opioid analgesics for chronic non-cancer pain: a database study. BMC Med 18, 162 (2020). https://doi.org/10.1186/[email or web address] Rashidian H, Zendehdel K, Kamangar F, Malekzadeh R, Haghdoost AA. An Ecological Study of the Association between Opiate Use and Incidence of Cancers. Addict Health. 2016 Fall;8(4):252-260. PMID: 28819556; PMCID: PMC5554805.  Our Goal: Our goal is to control your pain with means other than the use of opioid pain medications.  Our Recommendation: Talk to your physician about coming off of these medications. We can assist you with the tapering down and stopping these medicines. Based on the new information, even if you cannot completely stop the medication, a decrease in the dose may be associated with a lesser risk. Ask for other means of controlling the pain. Decrease or eliminate those factors that significantly contribute to your pain such as smoking, obesity, and a diet heavily tilted towards "inflammatory" nutrients.  Last Updated: 09/02/2022    ____________________________________________________________________________________________     ____________________________________________________________________________________________  Transfer of Pain Medication between Pharmacies  Re: 2023 DEA Clarification on existing regulation  Published on DEA Website: March 06, 2022  Title: Revised Regulation Allows DEA-Registered Pharmacies to Transfer Electronic Prescriptions at a Patient's Request DEA Headquarters Division - Public Information Office  "Patients now have the ability to request their electronic prescription be transferred to another pharmacy without having to go back to their practitioner to initiate the request. This revised regulation went into effect on Monday, March 02, 2022.     At a patient's request, a DEA-registered retail pharmacy can now transfer an electronic prescription for a controlled substance (schedules II-V) to another DEA-registered retail pharmacy. Prior to this change, patients would have to go through their practitioner to cancel their prescription and have it re-issued to a different pharmacy. The process was taxing and time consuming for both patients and practitioners.    The Drug Enforcement Administration (DEA) published its intent to revise the   process for transferring electronic prescriptions on May 24, 2020.  The final rule was published in the federal register on January 29, 2022 and went into effect 30 days later.  Under the final rule, a prescription can only be transferred once between pharmacies, and only if allowed under existing state or other applicable law. The prescription must remain in its electronic form; may not be altered in any way; and the transfer must be communicated directly between two licensed pharmacists. It's important to note, any authorized refills transfer with the original prescription, which means the entire prescription will be filled at the same pharmacy."     REFERENCES: 1. DEA website announcement https://www.dea.gov/stories/2023/2023-03/2022-09-01/revised-regulation-allows-dea-registered-pharmacies-transfer  2. Department of Justice website  https://www.govinfo.gov/content/pkg/FR-2022-01-29/pdf/2023-15847.pdf  3. DEPARTMENT OF JUSTICE Drug Enforcement Administration 21 CFR Part 1306 [Docket No. DEA-637] RIN [id number] "Transfer of Electronic Prescriptions for Schedules II-V Controlled Substances Between Pharmacies for Initial Filling"  ____________________________________________________________________________________________     _______________________________________________________________________  Medication Rules  Purpose: To inform patients, and their family members, of our medication rules and regulations.  Applies to: All patients receiving prescriptions from our practice (written or electronic).  Pharmacy of record: This is the pharmacy where your electronic prescriptions will be sent. Make sure we have the correct one.  Electronic prescriptions: In compliance with the [place] Strengthen Opioid Misuse Prevention (STOP) Act of 2017 (Session Law 2017-74/H243), effective July 06, 2018, all controlled substances must be electronically prescribed. Written prescriptions, faxing, or calling prescriptions to a pharmacy will no longer be done.  Prescription refills: These will be provided only during in-person appointments. No medications will be renewed without a "face-to-face" evaluation with your provider. Applies to all prescriptions.  NOTE: The following applies primarily to controlled substances (Opioid* Pain Medications).   Type of encounter (visit): For patients receiving controlled substances, face-to-face visits are required. (Not an option and not up to the patient.)  Patient's responsibilities: Pain Pills: Bring all pain pills to every appointment (except for procedure appointments). Pill Bottles: Bring  pills in original pharmacy bottle. Bring bottle, even if empty. Always bring the bottle of the most recent fill.  Medication refills: You are responsible for knowing and keeping track of what medications you are taking and when is it that you will need a refill. The day before your appointment: write a list of all prescriptions that need to be refilled. The day of the appointment: give the list to the admitting nurse. Prescriptions will be written only during appointments. No prescriptions will be written on procedure days. If you forget a medication: it will not be "Called in", "Faxed", or "electronically sent". You will need to get another appointment to get these prescribed. No early refills. Do not call asking to have your prescription filled early. Partial  or short prescriptions: Occasionally your pharmacy may not have enough pills to fill your prescription.  NEVER ACCEPT a partial fill or a prescription that is short of the total amount of pills that you were prescribed.  With controlled substances the law allows 72 hours for the pharmacy to complete the prescription.  If the prescription is not completed within 72 hours, the pharmacist will require a new prescription to be written. This means that you will be short on your medicine and we WILL NOT send another prescription to complete your original prescription.  Instead, request the pharmacy to send a carrier to a nearby branch to get enough medication to provide you with your full prescription. Prescription Accuracy: You are responsible for carefully inspecting your   prescriptions before leaving our office. Have the discharge nurse carefully go over each prescription with you, before taking them home. Make sure that your name is accurately spelled, that your address is correct. Check the name and dose of your medication to make sure it is accurate. Check the number of pills, and the written instructions to make sure they are clear and accurate. Make  sure that you are given enough medication to last until your next medication refill appointment. Taking Medication: Take medication as prescribed. When it comes to controlled substances, taking less pills or less frequently than prescribed is permitted and encouraged. Never take more pills than instructed. Never take the medication more frequently than prescribed.  Inform other Doctors: Always inform, all of your healthcare providers, of all the medications you take. Pain Medication from other Providers: You are not allowed to accept any additional pain medication from any other Doctor or Healthcare provider. There are two exceptions to this rule. (see below) In the event that you require additional pain medication, you are responsible for notifying us, as stated below. Cough Medicine: Often these contain an opioid, such as codeine or hydrocodone. Never accept or take cough medicine containing these opioids if you are already taking an opioid* medication. The combination may cause respiratory failure and death. Medication Agreement: You are responsible for carefully reading and following our Medication Agreement. This must be signed before receiving any prescriptions from our practice. Safely store a copy of your signed Agreement. Violations to the Agreement will result in no further prescriptions. (Additional copies of our Medication Agreement are available upon request.) Laws, Rules, & Regulations: All patients are expected to follow all Federal and State Laws, Statutes, Rules, & Regulations. Ignorance of the Laws does not constitute a valid excuse.  Illegal drugs and Controlled Substances: The use of illegal substances (including, but not limited to marijuana and its derivatives) and/or the illegal use of any controlled substances is strictly prohibited. Violation of this rule may result in the immediate and permanent discontinuation of any and all prescriptions being written by our practice. The use of  any illegal substances is prohibited. Adopted CDC guidelines & recommendations: Target dosing levels will be at or below 60 MME/day. Use of benzodiazepines** is not recommended.  Exceptions: There are only two exceptions to the rule of not receiving pain medications from other Healthcare Providers. Exception #1 (Emergencies): In the event of an emergency (i.e.: accident requiring emergency care), you are allowed to receive additional pain medication. However, you are responsible for: As soon as you are able, call our office [phone], at any time of the day or night, and leave a message stating your name, the date and nature of the emergency, and the name and dose of the medication prescribed. In the event that your call is answered by a member of our staff, make sure to document and save the date, time, and the name of the person that took your information.  Exception #2 (Planned Surgery): In the event that you are scheduled by another doctor or dentist to have any type of surgery or procedure, you are allowed (for a period no longer than 30 days), to receive additional pain medication, for the acute post-op pain. However, in this case, you are responsible for picking up a copy of our "Post-op Pain Management for Surgeons" handout, and giving it to your surgeon or dentist. This document is available at our office, and does not require an appointment to obtain it. Simply go to   our office during business hours (Monday-Thursday from 8:00 AM to 4:00 PM) (Friday 8:00 AM to 12:00 Noon) or if you have a scheduled appointment with us, prior to your surgery, and ask for it by name. In addition, you are responsible for: calling our office [phone], at any time of the day or night, and leaving a message stating your name, name of your surgeon, type of surgery, and date of procedure or surgery. Failure to comply with your responsibilities may result in termination of therapy involving the controlled  substances. Medication Agreement Violation. Following the above rules, including your responsibilities will help you in avoiding a Medication Agreement Violation ("Breaking your Pain Medication Contract").  Consequences:  Not following the above rules may result in permanent discontinuation of medication prescription therapy.  *Opioid medications include: morphine, codeine, oxycodone, oxymorphone, hydrocodone, hydromorphone, meperidine, tramadol, tapentadol, buprenorphine, fentanyl, methadone. **Benzodiazepine medications include: diazepam (Valium), alprazolam (Xanax), clonazepam (Klonopine), lorazepam (Ativan), clorazepate (Tranxene), chlordiazepoxide (Librium), estazolam (Prosom), oxazepam (Serax), temazepam (Restoril), triazolam (Halcion) (Last updated: 04/28/2022) ______________________________________________________________________    ______________________________________________________________________  Medication Recommendations and Reminders  Applies to: All patients receiving prescriptions (written and/or electronic).  Medication Rules & Regulations: You are responsible for reading, knowing, and following our "Medication Rules" document. These exist for your safety and that of others. They are not flexible and neither are we. Dismissing or ignoring them is an act of "non-compliance" that may result in complete and irreversible termination of such medication therapy. For safety reasons, "non-compliance" will not be tolerated. As with the [place] fundamental legal principle of "ignorance of the law is no defense", we will accept no excuses for not having read and knowing the content of documents provided to you by our practice.  Pharmacy of record:  Definition: This is the pharmacy where your electronic prescriptions will be sent.  We do not endorse any particular pharmacy. It is up to you and your insurance to decide what pharmacy to use.  We do not restrict you in your choice of  pharmacy. However, once we write for your prescriptions, we will NOT be re-sending more prescriptions to fix restricted supply problems created by your pharmacy, or your insurance.  The pharmacy listed in the electronic medical record should be the one where you want electronic prescriptions to be sent. If you choose to change pharmacy, simply notify our nursing staff. Changes will be made only during your regular appointments and not over the phone.  Recommendations: Keep all of your pain medications in a safe place, under lock and key, even if you live alone. We will NOT replace lost, stolen, or damaged medication. We do not accept "Police Reports" as proof of medications having been stolen. After you fill your prescription, take 1 week's worth of pills and put them away in a safe place. You should keep a separate, properly labeled bottle for this purpose. The remainder should be kept in the original bottle. Use this as your primary supply, until it runs out. Once it's gone, then you know that you have 1 week's worth of medicine, and it is time to come in for a prescription refill. If you do this correctly, it is unlikely that you will ever run out of medicine. To make sure that the above recommendation works, it is very important that you make sure your medication refill appointments are scheduled at least 1 week before you run out of medicine. To do this in an effective manner, make sure that you do not leave the office without   scheduling your next medication management appointment. Always ask the nursing staff to show you in your prescription , when your medication will be running out. Then arrange for the receptionist to get you a return appointment, at least 7 days before you run out of medicine. Do not wait until you have 1 or 2 pills left, to come in. This is very poor planning and does not take into consideration that we may need to cancel appointments due to bad weather, sickness, or emergencies  affecting our staff. DO NOT ACCEPT A "Partial Fill": If for any reason your pharmacy does not have enough pills/tablets to completely fill or refill your prescription, do not allow for a "partial fill". The law allows the pharmacy to complete that prescription within 72 hours, without requiring a new prescription. If they do not fill the rest of your prescription within those 72 hours, you will need a separate prescription to fill the remaining amount, which we will NOT provide. If the reason for the partial fill is your insurance, you will need to talk to the pharmacist about payment alternatives for the remaining tablets, but again, DO NOT ACCEPT A PARTIAL FILL, unless you can trust your pharmacist to obtain the remainder of the pills within 72 hours.  Prescription refills and/or changes in medication(s):  Prescription refills, and/or changes in dose or medication, will be conducted only during scheduled medication management appointments. (Applies to both, written and electronic prescriptions.) No refills on procedure days. No medication will be changed or started on procedure days. No changes, adjustments, and/or refills will be conducted on a procedure day. Doing so will interfere with the diagnostic portion of the procedure. No phone refills. No medications will be "called into the pharmacy". No Fax refills. No weekend refills. No Holliday refills. No after hours refills.  Remember:  Business hours are:  Monday to Thursday 8:00 AM to 4:00 PM Provider's Schedule: Machel Violante, MD - Appointments are:  Medication management: Monday and Wednesday 8:00 AM to 4:00 PM Procedure day: Tuesday and Thursday 7:30 AM to 4:00 PM Bilal Lateef, MD - Appointments are:  Medication management: Tuesday and Thursday 8:00 AM to 4:00 PM Procedure day: Monday and Wednesday 7:30 AM to 4:00 PM (Last update: 04/28/2022) ______________________________________________________________________     ____________________________________________________________________________________________  Drug Holidays  What is a "Drug Holiday"? Drug Holiday: is the name given to the process of slowly tapering down and temporarily stopping the pain medication for the purpose of decreasing or eliminating tolerance to the drug.  Benefits Improved effectiveness Decreased required effective dose Improved pain control End dependence on high dose therapy Decrease cost of therapy Uncovering "opioid-induced hyperalgesia". (OIH)  What is "opioid hyperalgesia"? It is a paradoxical increase in pain caused by exposure to opioids. Stopping the opioid pain medication, contrary to the expected, it actually decreases or completely eliminates the pain. Ref.: "A comprehensive review of opioid-induced hyperalgesia". Marion Lee, et.al. Pain Physician. 2011 Mar-Apr;14(2):145-61.  What is tolerance? Tolerance: the progressive loss of effectiveness of a pain medicine due to repetitive use. A common problem of opioid pain medications.  How long should a "Drug Holiday" last? Effectiveness depends on the patient staying off all opioid pain medicines for a minimum of 14 consecutive days. (2 weeks)  How about just taking less of the medicine? Does not work. Will not accomplish goal of eliminating the excess receptors.  How about switching to a different pain medicine? (AKA. "Opioid rotation") Does not work. Creates the illusion of effectiveness by taking advantage of   inaccurate equivalent dose calculations between different opioids. -This "technique" was promoted by studies funded by pharmaceutical companies, such as [employer], creators of "OxyContin".  Can I stop the medicine "cold turkey"? We do not recommend it. You should always coordinate with your prescribing physician to make the transition as smoothly as possible. Avoid stopping the medicine abruptly without consulting. We recommend a "slow taper".  What  is a slow taper? Taper: refers to the gradual decrease in dose.   How do I stop/taper the dose? Slowly. Decrease the daily amount of pills that you take by one (1) pill every seven (7) days. This is called a "slow downward taper". Example: if you normally take four (4) pills per day, drop it to three (3) pills per day for seven (7) days, then to two (2) pills per day for seven (7) days, then to one (1) per day for seven (7) days, and then stop the medicine. The 14 day "Drug Holiday" starts on the first day without medicine.   Will I experience withdrawals? Unlikely with a slow taper.  What triggers withdrawals? Withdrawals are triggered by the sudden/abrupt stop of high dose opioids. Withdrawals can be avoided by slowly decreasing the dose over a prolonged period of time.  What are withdrawals? Symptoms associated with sudden/abrupt reduction/stopping of high-dose, long-term use of pain medication. Withdrawal are seldom seen on low dose therapy, or patients rarely taking opioid medication.  Early Withdrawal Symptoms may include: Agitation Anxiety Muscle aches Increased tearing Insomnia Runny nose Sweating Yawning  Late symptoms may include: Abdominal cramping Diarrhea Dilated pupils Goose bumps Nausea Vomiting  When could I see withdrawals? Onset: 8-24 hours after last use for most opioids. 12-48 hours for long-acting opioids (i.e.: methadone)  How long could they last? Duration: 4-10 days for most opioids. 14-21 days for long-acting opioids (i.e.: methadone)  What will happen after I complete my "Drug Holiday"? The need and indications for the opioid analgesic will be reviewed before restarting the medication. Dose requirements will likely decrease and the dose will need to be adjusted accordingly.   (Last update: 09/23/2022) ____________________________________________________________________________________________    ____________________________________________________________________________________________  Naloxone Nasal Spray  Why am I receiving this medication? [place] STOP ACT requires that all patients taking high dose opioids or at risk of opioids respiratory depression, be prescribed an opioid reversal agent, such as Naloxone (AKA: Narcan).  What is this medication? NALOXONE (nal OX one) treats opioid overdose, which causes slow or shallow breathing, severe drowsiness, or trouble staying awake. Call emergency services after using this medication. You may need additional treatment. Naloxone works by reversing the effects of opioids. It belongs to a group of medications called opioid blockers.  COMMON BRAND NAME(S): Kloxxado, Narcan  What should I tell my care team before I take this medication? They need to know if you have any of these conditions: Heart disease Substance use disorder An unusual or allergic reaction to naloxone, other medications, foods, dyes, or preservatives Pregnant or trying to get pregnant Breast-feeding  When to use this medication? This medication is to be used for the treatment of respiratory depression (less than 8 breaths per minute) secondary to opioid overdose.   How to use this medication? This medication is for use in the nose. Lay the person on their back. Support their neck with your hand and allow the head to tilt back before giving the medication. The nasal spray should be given into 1 nostril. After giving the medication, move the person onto their side.   Do not remove or test the nasal spray until ready to use. Get emergency medical help right away after giving the first dose of this medication, even if the person wakes up. You should be familiar with how to recognize the signs and symptoms of a narcotic overdose. If more doses are needed, give the additional dose in the other nostril. Talk to your care team about the use of this medication in children.  While this medication may be prescribed for children as young as newborns for selected conditions, precautions do apply.  Naloxone Overdosage: If you think you have taken too much of this medicine contact a poison control center or emergency room at once.  NOTE: This medicine is only for you. Do not share this medicine with others.  What if I miss a dose? This does not apply.  What may interact with this medication? This is only used during an emergency. No interactions are expected during emergency use. This list may not describe all possible interactions. Give your health care provider a list of all the medicines, herbs, non-prescription drugs, or dietary supplements you use. Also tell them if you smoke, drink alcohol, or use illegal drugs. Some items may interact with your medicine.  What should I watch for while using this medication? Keep this medication ready for use in the case of an opioid overdose. Make sure that you have the phone number of your care team and local hospital ready. You may need to have additional doses of this medication. Each nasal spray contains a single dose. Some emergencies may require additional doses. After use, bring the treated person to the nearest hospital or call 911. Make sure the treating care team knows that the person has received a dose of this medication. You will receive additional instructions on what to do during and after use of this medication before an emergency occurs.  What side effects may I notice from receiving this medication? Side effects that you should report to your care team as soon as possible: Allergic reactions--skin rash, itching, hives, swelling of the face, lips, tongue, or throat Side effects that usually do not require medical attention (report these to your care team if they continue or are bothersome): Constipation Dryness or irritation inside the nose Headache Increase in blood pressure Muscle spasms Stuffy  nose Toothache This list may not describe all possible side effects. Call your doctor for medical advice about side effects. You may report side effects to FDA at 1-800-FDA-1088.  Where should I keep my medication? Because this is an emergency medication, you should keep it with you at all times.  Keep out of the reach of children and pets. Store between 20 and 25 degrees C (68 and 77 degrees F). Do not freeze. Throw away any unused medication after the expiration date. Keep in original box until ready to use.  NOTE: This sheet is a summary. It may not cover all possible information. If you have questions about this medicine, talk to your doctor, pharmacist, or health care provider.   2023 Elsevier/Gold Standard (2021-02-28 00:00:00)  ____________________________________________________________________________________________   

## 2023-01-11 ENCOUNTER — Ambulatory Visit: Payer: BC Managed Care – PPO | Attending: Pain Medicine | Admitting: Pain Medicine

## 2023-01-11 ENCOUNTER — Encounter: Payer: Self-pay | Admitting: Pain Medicine

## 2023-01-11 VITALS — BP 121/88 | HR 112 | Temp 98.1°F | Resp 16 | Ht 70.0 in | Wt 178.0 lb

## 2023-01-11 DIAGNOSIS — Z79891 Long term (current) use of opiate analgesic: Secondary | ICD-10-CM | POA: Diagnosis present

## 2023-01-11 DIAGNOSIS — M961 Postlaminectomy syndrome, not elsewhere classified: Secondary | ICD-10-CM

## 2023-01-11 DIAGNOSIS — M5442 Lumbago with sciatica, left side: Secondary | ICD-10-CM | POA: Diagnosis present

## 2023-01-11 DIAGNOSIS — M5137 Other intervertebral disc degeneration, lumbosacral region: Secondary | ICD-10-CM | POA: Diagnosis present

## 2023-01-11 DIAGNOSIS — G894 Chronic pain syndrome: Secondary | ICD-10-CM | POA: Diagnosis present

## 2023-01-11 DIAGNOSIS — Z79899 Other long term (current) drug therapy: Secondary | ICD-10-CM | POA: Diagnosis present

## 2023-01-11 DIAGNOSIS — G8929 Other chronic pain: Secondary | ICD-10-CM | POA: Diagnosis present

## 2023-01-11 DIAGNOSIS — M5441 Lumbago with sciatica, right side: Secondary | ICD-10-CM

## 2023-01-11 DIAGNOSIS — M79604 Pain in right leg: Secondary | ICD-10-CM | POA: Diagnosis present

## 2023-01-11 DIAGNOSIS — M79605 Pain in left leg: Secondary | ICD-10-CM

## 2023-01-11 MED ORDER — OXYCODONE HCL 10 MG PO TABS: 10.0000 mg | ORAL_TABLET | Freq: Four times a day (QID) | ORAL | 0 refills | Status: DC | PRN

## 2023-01-11 MED ORDER — OXYCODONE HCL 10 MG PO TABS
10.00 mg | ORAL_TABLET | Freq: Four times a day (QID) | ORAL | 0 refills | Status: AC | PRN
Start: 2023-03-12 — End: 2023-04-11

## 2023-01-11 NOTE — Progress Notes (Signed)
Nursing Pain Medication Assessment:  Safety precautions to be maintained throughout the outpatient stay will include: orient to surroundings, keep bed in low position, maintain call bell within reach at all times, provide assistance with transfer out of bed and ambulation.  Medication Inspection Compliance: Pill count conducted under aseptic conditions, in front of the patient. Neither the pills nor the bottle was removed from the patient's sight at any time. Once count was completed pills were immediately returned to the patient in their original bottle.  Medication: Oxycodone IR Pill/Patch Count:  0 of 120 pills remain Pill/Patch Appearance: Markings consistent with prescribed medication Bottle Appearance: Standard pharmacy container. Clearly labeled. Filled Date: 06 / 08 / 2024 Last Medication intake:  Today

## 2023-01-13 LAB — TOXASSURE SELECT 13 (MW), URINE

## 2023-04-02 NOTE — Patient Instructions (Incomplete)
______________________________________________________________________    Procedure instructions  Stop blood-thinners  Do not eat or drink fluids (other than water) for 6 hours before your procedure  No water for 2 hours before your procedure  Take your blood pressure medicine with a sip of water  Arrive 30 minutes before your appointment  If sedation is planned, bring suitable driver. Pennie Banter, Benedetto Goad, & public transportation are NOT APPROVED)  Carefully read the "Preparing for your procedure" detailed instructions  If you have questions call us at [phone]  ______________________________________________________________________      ______________________________________________________________________    Preparing for your procedure  Appointments: If you think you may not be able to keep your appointment, call 24-48 hours in advance to cancel. We need time to make it available to others.  During your procedure appointment there will be: No Prescription Refills. No disability issues to discussed. No medication changes or discussions.  Instructions: Food intake: Avoid eating anything solid for at least 8 hours prior to your procedure. Clear liquid intake: You may take clear liquids such as water up to 2 hours prior to your procedure. (No carbonated drinks. No soda.) Transportation: Unless otherwise stated by your physician, bring a driver. (Driver cannot be a Market researcher, Pharmacist, community, or any other form of public transportation.) Morning Medicines: Except for blood thinners, take all of your other morning medications with a sip of water. Make sure to take your heart and blood pressure medicines. If your blood pressure's lower number is above 100, the case will be rescheduled. Blood thinners: Make sure to stop your blood thinners as instructed.  If you take a blood thinner, but were not instructed to stop it, call our office [phone] and ask to talk to a nurse. Not stopping a blood  thinner prior to certain procedures could lead to serious complications. Diabetics on insulin: Notify the staff so that you can be scheduled 1st case in the morning. If your diabetes requires high dose insulin, take only  of your normal insulin dose the morning of the procedure and notify the staff that you have done so. Preventing infections: Shower with an antibacterial soap the morning of your procedure.  Build-up your immune system: Take 1000 mg of Vitamin C with every meal (3 times a day) the day prior to your procedure. Antibiotics: Inform the nursing staff if you are taking any antibiotics or if you have any conditions that may require antibiotics prior to procedures. (Example: recent joint implants)   Pregnancy: If you are pregnant make sure to notify the nursing staff. Not doing so may result in injury to the fetus, including death.  Sickness: If you have a cold, fever, or any active infections, call and cancel or reschedule your procedure. Receiving steroids while having an infection may result in complications. Arrival: You must be in the facility at least 30 minutes prior to your scheduled procedure. Tardiness: Your scheduled time is also the cutoff time. If you do not arrive at least 15 minutes prior to your procedure, you will be rescheduled.  Children: Do not bring any children with you. Make arrangements to keep them home. Dress appropriately: There is always a possibility that your clothing may get soiled. Avoid long dresses. Valuables: Do not bring any jewelry or valuables.  Reasons to call and reschedule or cancel your procedure: (Following these recommendations will minimize the risk of a serious complication.) Surgeries: Avoid having procedures within 2 weeks of any surgery. (Avoid for 2 weeks before or after any surgery). Flu Shots:  Avoid having procedures within 2 weeks of a flu shots or . (Avoid for 2 weeks before or after immunizations). Barium: Avoid having a procedure  within 7-10 days after having had a radiological study involving the use of radiological contrast. (Myelograms, Barium swallow or enema study). Heart attacks: Avoid any elective procedures or surgeries for the initial 6 months after a "Myocardial Infarction" (Heart Attack). Blood thinners: It is imperative that you stop these medications before procedures. Let us know if you if you take any blood thinner.  Infection: Avoid procedures during or within two weeks of an infection (including chest colds or gastrointestinal problems). Symptoms associated with infections include: Localized redness, fever, chills, night sweats or profuse sweating, burning sensation when voiding, cough, congestion, stuffiness, runny nose, sore throat, diarrhea, nausea, vomiting, cold or Flu symptoms, recent or current infections. It is specially important if the infection is over the area that we intend to treat. Heart and lung problems: Symptoms that may suggest an active cardiopulmonary problem include: cough, chest pain, breathing difficulties or shortness of breath, dizziness, ankle swelling, uncontrolled high or unusually low blood pressure, and/or palpitations. If you are experiencing any of these symptoms, cancel your procedure and contact your primary care physician for an evaluation.  Remember:  Regular Business hours are:  Monday to Thursday 8:00 AM to 4:00 PM  Provider's Schedule: Delano Metz, MD:  Procedure days: Tuesday and Thursday 7:30 AM to 4:00 PM  Edward Jolly, MD:  Procedure days: Monday and Wednesday 7:30 AM to 4:00 PM Last  Updated: 02/23/2023 ______________________________________________________________________      ______________________________________________________________________    General Risks and Possible Complications  Patient Responsibilities: It is important that you read this as it is part of your informed consent. It is our duty to inform you of the risks and possible  complications associated with treatments offered to you. It is your responsibility as a patient to read this and to ask questions about anything that is not clear or that you believe was not covered in this document.  Patient's Rights: You have the right to refuse treatment. You also have the right to change your mind, even after initially having agreed to have the treatment done. However, under this last option, if you wait until the last second to change your mind, you may be charged for the materials used up to that point.  Introduction: Medicine is not an Visual merchandiser. Everything in Medicine, including the lack of treatment(s), carries the potential for danger, harm, or loss (which is by definition: Risk). In Medicine, a complication is a secondary problem, condition, or disease that can aggravate an already existing one. All treatments carry the risk of possible complications. The fact that a side effects or complications occurs, does not imply that the treatment was conducted incorrectly. It must be clearly understood that these can happen even when everything is done following the highest safety standards.  No treatment: You can choose not to proceed with the proposed treatment alternative. The "PRO(s)" would include: avoiding the risk of complications associated with the therapy. The "CON(s)" would include: not getting any of the treatment benefits. These benefits fall under one of three categories: diagnostic; therapeutic; and/or palliative. Diagnostic benefits include: getting information which can ultimately lead to improvement of the disease or symptom(s). Therapeutic benefits are those associated with the successful treatment of the disease. Finally, palliative benefits are those related to the decrease of the primary symptoms, without necessarily curing the condition (example: decreasing the pain from a flare-up  of a chronic condition, such as incurable terminal cancer).  General Risks and  Complications: These are associated to most interventional treatments. They can occur alone, or in combination. They fall under one of the following six (6) categories: no benefit or worsening of symptoms; bleeding; infection; nerve damage; allergic reactions; and/or death. No benefits or worsening of symptoms: In Medicine there are no guarantees, only probabilities. No healthcare provider can ever guarantee that a medical treatment will work, they can only state the probability that it may. Furthermore, there is always the possibility that the condition may worsen, either directly, or indirectly, as a consequence of the treatment. Bleeding: This is more common if the patient is taking a blood thinner, either prescription or over the counter (example: Goody Powders, Fish oil, Aspirin, Garlic, etc.), or if suffering a condition associated with impaired coagulation (example: Hemophilia, cirrhosis of the liver, low platelet counts, etc.). However, even if you do not have one on these, it can still happen. If you have any of these conditions, or take one of these drugs, make sure to notify your treating physician. Infection: This is more common in patients with a compromised immune system, either due to disease (example: diabetes, cancer, human immunodeficiency virus [HIV], etc.), or due to medications or treatments (example: therapies used to treat cancer and rheumatological diseases). However, even if you do not have one on these, it can still happen. If you have any of these conditions, or take one of these drugs, make sure to notify your treating physician. Nerve Damage: This is more common when the treatment is an invasive one, but it can also happen with the use of medications, such as those used in the treatment of cancer. The damage can occur to small secondary nerves, or to large primary ones, such as those in the spinal cord and brain. This damage may be temporary or permanent and it may lead to  impairments that can range from temporary numbness to permanent paralysis and/or brain death. Allergic Reactions: Any time a substance or material comes in contact with our body, there is the possibility of an allergic reaction. These can range from a mild skin rash (contact dermatitis) to a severe systemic reaction (anaphylactic reaction), which can result in death. Death: In general, any medical intervention can result in death, most of the time due to an unforeseen complication. ______________________________________________________________________      ______________________________________________________________________    Opioid Pain Medication Update  To: All patients taking opioid pain medications. (I.e.: hydrocodone, hydromorphone, oxycodone, oxymorphone, morphine, codeine, methadone, tapentadol, tramadol, buprenorphine, fentanyl, etc.)  Re: Updated review of side effects and adverse reactions of opioid analgesics, as well as new information about long term effects of this class of medications.  Direct risks of long-term opioid therapy are not limited to opioid addiction and overdose. Potential medical risks include serious fractures, breathing problems during sleep, hyperalgesia, immunosuppression, chronic constipation, bowel obstruction, myocardial infarction, and tooth decay secondary to xerostomia.  Unpredictable adverse effects that can occur even if you take your medication correctly: Cognitive impairment, respiratory depression, and death. Most people think that if they take their medication "correctly", and "as instructed", that they will be safe. Nothing could be farther from the truth. In reality, a significant amount of recorded deaths associated with the use of opioids has occurred in individuals that had taken the medication for a long time, and were taking their medication correctly. The following are examples of how this can happen: Patient taking his/her medication for a  long time, as instructed, without any side effects, is given a certain antibiotic or another unrelated medication, which in turn triggers a "Drug-to-drug interaction" leading to disorientation, cognitive impairment, impaired reflexes, respiratory depression or an untoward event leading to serious bodily harm or injury, including death.  Patient taking his/her medication for a long time, as instructed, without any side effects, develops an acute impairment of liver and/or kidney function. This will lead to a rapid inability of the body to breakdown and eliminate their pain medication, which will result in effects similar to an "overdose", but with the same medicine and dose that they had always taken. This again may lead to disorientation, cognitive impairment, impaired reflexes, respiratory depression or an untoward event leading to serious bodily harm or injury, including death.  A similar problem will occur with patients as they grow older and their liver and kidney function begins to decrease as part of the aging process.  Background information: Historically, the original case for using long-term opioid therapy to treat chronic noncancer pain was based on safety assumptions that subsequent experience has called into question. In 1996, the American Pain Society and the American Academy of Pain Medicine issued a consensus statement supporting long-term opioid therapy. This statement acknowledged the dangers of opioid prescribing but concluded that the risk for addiction was low; respiratory depression induced by opioids was short-lived, occurred mainly in opioid-naive patients, and was antagonized by pain; tolerance was not a common problem; and efforts to control diversion should not constrain opioid prescribing. This has now proven to be wrong. Experience regarding the risks for opioid addiction, misuse, and overdose in community practice has failed to support these assumptions.  According to the Centers  for Disease Control and Prevention, fatal overdoses involving opioid analgesics have increased sharply over the past decade. Currently, more than 96,700 people die from drug overdoses every year. Opioids are a factor in 7 out of every 10 overdose deaths. Deaths from drug overdose have surpassed motor vehicle accidents as the leading cause of death for individuals between the ages of 86 and 63.  Clinical data suggest that neuroendocrine dysfunction may be very common in both men and women, potentially causing hypogonadism, erectile dysfunction, infertility, decreased libido, osteoporosis, and depression. Recent studies linked higher opioid dose to increased opioid-related mortality. Controlled observational studies reported that long-term opioid therapy may be associated with increased risk for cardiovascular events. Subsequent meta-analysis concluded that the safety of long-term opioid therapy in elderly patients has not been proven.   Side Effects and adverse reactions: Common side effects: Drowsiness (sedation). Dizziness. Nausea and vomiting. Constipation. Physical dependence -- Dependence often manifests with withdrawal symptoms when opioids are discontinued or decreased. Tolerance -- As you take repeated doses of opioids, you require increased medication to experience the same effect of pain relief. Respiratory depression -- This can occur in healthy people, especially with higher doses. However, people with COPD, asthma or other lung conditions may be even more susceptible to fatal respiratory impairment.  Uncommon side effects: An increased sensitivity to feeling pain and extreme response to pain (hyperalgesia). Chronic use of opioids can lead to this. Delayed gastric emptying (the process by which the contents of your stomach are moved into your small intestine). Muscle rigidity. Immune system and hormonal dysfunction. Quick, involuntary muscle jerks (myoclonus). Arrhythmia. Itchy skin  (pruritus). Dry mouth (xerostomia).  Long-term side effects: Chronic constipation. Sleep-disordered breathing (SDB). Increased risk of bone fractures. Hypothalamic-pituitary-adrenal dysregulation. Increased risk of overdose.  RISKS: Respiratory depression and death: Opioids  increase the risk of respiratory depression and death.  Drug-to-drug interactions: Opioids are relatively contraindicated in combination with benzodiazepines, sleep inducers, and other central nervous system depressants. Other classes of medications (i.e.: certain antibiotics and even over-the-counter medications) may also trigger or induce respiratory depression in some patients.  Medical conditions: Patients with pre-existing respiratory problems are at higher risk of respiratory failure and/or depression when in combination with opioid analgesics. Opioids are relatively contraindicated in some medical conditions such as central sleep apnea.   Fractures and Falls:  Opioids increase the risk and incidence of falls. This is of particular importance in elderly patients.  Endocrine System:  Long-term administration is associated with endocrine abnormalities (endocrinopathies). (Also known as Opioid-induced Endocrinopathy) Influences on both the hypothalamic-pituitary-adrenal axis?and the hypothalamic-pituitary-gonadal axis have been demonstrated with consequent hypogonadism and adrenal insufficiency in both sexes. Hypogonadism and decreased levels of dehydroepiandrosterone sulfate have been reported in men and women. Endocrine effects include: Amenorrhoea in women (abnormal absence of menstruation) Reduced libido in both sexes Decreased sexual function Erectile dysfunction in men Hypogonadisms (decreased testicular function with shrinkage of testicles) Infertility Depression and fatigue Loss of muscle mass Anxiety Depression Immune suppression Hyperalgesia Weight gain Anemia Osteoporosis Patients (particularly  women of childbearing age) should avoid opioids. There is insufficient evidence to recommend routine monitoring of asymptomatic patients taking opioids in the long-term for hormonal deficiencies.  Immune System: Human studies have demonstrated that opioids have an immunomodulating effect. These effects are mediated via opioid receptors both on immune effector cells and in the central nervous system. Opioids have been demonstrated to have adverse effects on antimicrobial response and anti-tumour surveillance. Buprenorphine has been demonstrated to have no impact on immune function.  Opioid Induced Hyperalgesia: Human studies have demonstrated that prolonged use of opioids can lead to a state of abnormal pain sensitivity, sometimes called opioid induced hyperalgesia (OIH). Opioid induced hyperalgesia is not usually seen in the absence of tolerance to opioid analgesia. Clinically, hyperalgesia may be diagnosed if the patient on long-term opioid therapy presents with increased pain. This might be qualitatively and anatomically distinct from pain related to disease progression or to breakthrough pain resulting from development of opioid tolerance. Pain associated with hyperalgesia tends to be more diffuse than the pre-existing pain and less defined in quality. Management of opioid induced hyperalgesia requires opioid dose reduction.  Cancer: Chronic opioid therapy has been associated with an increased risk of cancer among noncancer patients with chronic pain. This association was more evident in chronic strong opioid users. Chronic opioid consumption causes significant pathological changes in the small intestine and colon. Epidemiological studies have found that there is a link between opium dependence and initiation of gastrointestinal cancers. Cancer is the second leading cause of death after cardiovascular disease. Chronic use of opioids can cause multiple conditions such as GERD, immunosuppression and  renal damage as well as carcinogenic effects, which are associated with the incidence of cancers.   Mortality: Long-term opioid use has been associated with increased mortality among patients with chronic non-cancer pain (CNCP).  Prescription of long-acting opioids for chronic noncancer pain was associated with a significantly increased risk of all-cause mortality, including deaths from causes other than overdose.  Reference: Von Korff M, Kolodny A, Deyo RA, Chou R. Long-term opioid therapy reconsidered. Ann Intern Med. 2011 Sep 6;155(5):325-8. doi: 10.7326/[id number]. PMID: 36644034; PMCID: [id number]. Randon Goldsmith, Hayward RA, Dunn KM, [place] KP. Risk of adverse events in patients prescribed long-term opioids: A cohort study in the [place] [facility]  [employer]. Eur J Pain. 2019 May;23(5):908-922. doi: 10.1002/ejp.1357. Epub 2019 Jan 31. PMID: 16109604. Colameco S, Coren JS, Ciervo CA. Continuous opioid treatment for chronic noncancer pain: a time for moderation in prescribing. Postgrad Med. 2009 Jul;121(4):61-6. doi: 10.3810/pgm.2009.07.2032. PMID: 54098119. William Hamburger RN, [place], Blazina I, Cristopher [place], Bougatsos C, Deyo RA. The effectiveness and risks of long-term opioid therapy for chronic pain: a systematic review for a [employer] to [employer]. Ann Intern Med. 2015 Feb 17;162(4):276-86. doi: 10.7326/M14-2559. PMID: 14782956. Caryl Bis [facility], [place] DM. NCHS Data Brief No. 22. [place]: Centers for Disease Control and Prevention; 2009. Sep, Increase in Fatal Poisonings Involving Opioid Analgesics in the [place], 1999-2006. Song IA, Choi HR, Oh TK. Long-term opioid use and mortality in patients with chronic non-cancer pain: Ten-year follow-up study in [place] from 2010 through 2019. EClinicalMedicine. 2022 Jul 18;51:101558. doi: 10.1016/j.eclinm.2022.213086. PMID: 57846962; PMCID:  [id number]. Huser, W., Schubert, T., Vogelmann, T. et al. All-cause mortality in patients with long-term opioid therapy compared with non-opioid analgesics for chronic non-cancer pain: a database study. BMC Med 18, 162 (2020). http://lester.info/ Rashidian H, Karie Kirks, Malekzadeh R, Haghdoost AA. An Ecological Study of the Association between Opiate Use and Incidence of Cancers. Addict Health. 2016 Fall;8(4):252-260. PMID: 01027253; PMCID: [id number].  Our Goal: Our goal is to control your pain with means other than the use of opioid pain medications.  Our Recommendation: Talk to your physician about coming off of these medications. We can assist you with the tapering down and stopping these medicines. Based on the new information, even if you cannot completely stop the medication, a decrease in the dose may be associated with a lesser risk. Ask for other means of controlling the pain. Decrease or eliminate those factors that significantly contribute to your pain such as smoking, obesity, and a diet heavily tilted towards "inflammatory" nutrients.  Last Updated: 01/11/2023   ______________________________________________________________________       ______________________________________________________________________    National Pain Medication Shortage  The [place] is experiencing worsening drug shortages. These have had a negative widespread effect on patient care and treatment. Not expected to improve any time soon. Predicted to last past 2029.   Drug shortage list (generic names) Oxycodone IR Oxycodone/APAP Oxymorphone IR Hydromorphone Hydrocodone/APAP Morphine  Where is the problem?  Manufacturing and supply level.  Will this shortage affect you?  Only if you take any of the above pain medications.  How? You may be unable to fill your prescription.  Your pharmacist may offer a "partial fill" of your prescription. (Warning: Do not  accept partial fills.) Prescriptions partially filled cannot be transferred to another pharmacy. Read our Medication Rules and Regulation. Depending on how much medicine you are dependent on, you may experience withdrawals when unable to get the medication.  Recommendations: Consider ending your dependence on opioid pain medications. Ask your pain specialist to assist you with the process. Consider switching to a medication currently not in shortage, such as Buprenorphine. Talk to your pain specialist about this option. Consider decreasing your pain medication requirements by managing tolerance thru "Drug Holidays". This may help minimize withdrawals, should you run out of medicine. Control your pain thru the use of non-pharmacological interventional therapies.   Your prescriber: Prescribers cannot be blamed for shortages. Medication manufacturing and supply issues cannot be fixed by the prescriber.   NOTE: The prescriber is not responsible for supplying the medication, or solving supply issues. Work with Engineer, production  to solve it. The patient is responsible for the decision to take or continue taking the medication and for identifying and securing a legal supply source. By law, supplying the medication is the job and responsibility of the pharmacy. The prescriber is responsible for the evaluation, monitoring, and prescribing of these medications.   Prescribers will NOT: Re-issue prescriptions that have been partially filled. Re-issue prescriptions already sent to a pharmacy.  Re-send prescriptions to a different pharmacy because yours did not have your medication. Ask pharmacist to order more medicine or transfer the prescription to another pharmacy. (Read below.)  New 2023 regulation: "March 06, 2022 Revised Regulation Allows DEA-Registered Pharmacies to Transfer Electronic Prescriptions at a Patient's Request DEA Headquarters Division - Public Information Office Patients now have the  ability to request their electronic prescription be transferred to another pharmacy without having to go back to their practitioner to initiate the request. This revised regulation went into effect on Monday, March 02, 2022.     At a patient's request, a DEA-registered retail pharmacy can now transfer an electronic prescription for a controlled substance (schedules II-V) to another DEA-registered retail pharmacy. Prior to this change, patients would have to go through their practitioner to cancel their prescription and have it re-issued to a different pharmacy. The process was taxing and time consuming for both patients and practitioners.    The Drug Enforcement Administration [facility]) published its intent to revise the process for transferring electronic prescriptions on May 24, 2020.  The final rule was published in the federal register on January 29, 2022 and went into effect 30 days later.  Under the final rule, a prescription can only be transferred once between pharmacies, and only if allowed under existing state or other applicable law. The prescription must remain in its electronic form; may not be altered in any way; and the transfer must be communicated directly between two licensed pharmacists. It's important to note, any authorized refills transfer with the original prescription, which means the entire prescription will be filled at the same pharmacy".  Reference: [email or web address].is [facility] website announcement)  [email or web address] Financial pl[employer] of [employer])   [employer] / Vol. 88, No. 143 / Thursday, January 29, 2022 / Rules and Regulations DEPARTMENT OF JUSTICE  Drug Enforcement Administration  21 CFR Part 1306  [Docket No. [id number]]  RIN [id number] Transfer of Electronic Prescriptions for Schedules II-V Controlled Substances  Between Pharmacies for Initial Filling  ______________________________________________________________________       ______________________________________________________________________    Transfer of Pain Medication between Pharmacies  Re: 2023 DEA Clarification on existing regulation  Published on DEA Website: March 06, 2022  Title: Revised Regulation Allows DEA-Registered Pharmacies to Electrical engineer Prescriptions at a Patient's Request DEA Headquarters Division - [employer]  "Patients now have the ability to request their electronic prescription be transferred to another pharmacy without having to go back to their practitioner to initiate the request. This revised regulation went into effect on Monday, March 02, 2022.     At a patient's request, a DEA-registered retail pharmacy can now transfer an electronic prescription for a controlled substance (schedules II-V) to another DEA-registered retail pharmacy. Prior to this change, patients would have to go through their practitioner to cancel their prescription and have it re-issued to a different pharmacy. The process was taxing and time consuming for both patients and practitioners.    The Drug Enforcement Administration [facility]) published its intent to revise the process for transferring electronic prescriptions on May 24, 2020.  The final rule was published in the federal register on January 29, 2022 and went into effect 30 days later.  Under the final rule, a prescription can only be transferred once between pharmacies, and only if allowed under existing state or other applicable law. The prescription must remain in its electronic form; may not be altered in any way; and the transfer must be communicated directly between two licensed pharmacists. It's important to note, any authorized refills transfer with the original prescription, which means the entire prescription will be filled at the same pharmacy."     REFERENCES: 1. DEA website announcement [email or web address]  2. Department of Justice website  [email or web address]  3. DEPARTMENT OF JUSTICE Drug Enforcement Administration 21 CFR Part 1306 [Docket No. [id number]] RIN [id number] "Transfer of Electronic Prescriptions for Schedules II-V Controlled Substances Between Pharmacies for Initial Filling"  ______________________________________________________________________       ______________________________________________________________________    Medication Rules  Purpose: To inform patients, and their family members, of our medication rules and regulations.  Applies to: All patients receiving prescriptions from our practice (written or electronic).  Pharmacy of record: This is the pharmacy where your electronic prescriptions will be sent. Make sure we have the correct one.  Electronic prescriptions: In compliance with the [facility] Strengthen Opioid Misuse Prevention (STOP) Act of 2017 (Session Conni Elliot [phone]), effective July 06, 2018, all controlled substances must be electronically prescribed. Written prescriptions, faxing, or calling prescriptions to a pharmacy will no longer be done.  Prescription refills: These will be provided only during in-person appointments. No medications will be renewed without a "face-to-face" evaluation with your provider. Applies to all prescriptions.  NOTE: The following applies primarily to controlled substances (Opioid* Pain Medications).   Type of encounter (visit): For patients receiving controlled substances, face-to-face visits are required. (Not an option and not up to the patient.)  Patient's responsibilities: Pain Pills: Bring all pain pills to every appointment (except for procedure appointments). Pill Bottles: Bring pills in original  pharmacy bottle. Bring bottle, even if empty. Always bring the bottle of the most recent fill.  Medication refills: You are responsible for knowing and keeping track of what medications you are taking and when is it that you will need a refill. The day before your appointment: write a list of all prescriptions that need to be refilled. The day of the appointment: give the list to the admitting nurse. Prescriptions will be written only during appointments. No prescriptions will be written on procedure days. If you forget a medication: it will not be "Called in", "Faxed", or "electronically sent". You will need to get another appointment to get these prescribed. No early refills. Do not call asking to have your prescription filled early. Partial  or short prescriptions: Occasionally your pharmacy may not have enough pills to fill your prescription.  NEVER ACCEPT a partial fill or a prescription that is short of the total amount of pills that you were prescribed.  With controlled substances the law allows 72 hours for the pharmacy to complete the prescription.  If the prescription is not completed within 72 hours, the pharmacist will require a new prescription to be written. This means that you will be short on your medicine and we WILL NOT send another prescription to complete your original prescription.  Instead, request the pharmacy to send a carrier to a nearby branch to get enough medication to provide you with your full prescription. Prescription Accuracy: You are responsible for carefully inspecting your prescriptions before leaving our office. Have  the discharge nurse carefully go over each prescription with you, before taking them home. Make sure that your name is accurately spelled, that your address is correct. Check the name and dose of your medication to make sure it is accurate. Check the number of pills, and the written instructions to make sure they are clear and accurate. Make sure that you are  given enough medication to last until your next medication refill appointment. Taking Medication: Take medication as prescribed. When it comes to controlled substances, taking less pills or less frequently than prescribed is permitted and encouraged. Never take more pills than instructed. Never take the medication more frequently than prescribed.  Inform other Doctors: Always inform, all of your healthcare providers, of all the medications you take. Pain Medication from other Providers: You are not allowed to accept any additional pain medication from any other Doctor or Healthcare provider. There are two exceptions to this rule. (see below) In the event that you require additional pain medication, you are responsible for notifying us, as stated below. Cough Medicine: Often these contain an opioid, such as codeine or hydrocodone. Never accept or take cough medicine containing these opioids if you are already taking an opioid* medication. The combination may cause respiratory failure and death. Medication Agreement: You are responsible for carefully reading and following our Medication Agreement. This must be signed before receiving any prescriptions from our practice. Safely store a copy of your signed Agreement. Violations to the Agreement will result in no further prescriptions. (Additional copies of our Medication Agreement are available upon request.) Laws, Rules, & Regulations: All patients are expected to follow all [address] and [employer], Rules, [employer]. Ignorance of the Laws does not constitute a valid excuse.  Illegal drugs and Controlled Substances: The use of illegal substances (including, but not limited to marijuana and its derivatives) and/or the illegal use of any controlled substances is strictly prohibited. Violation of this rule may result in the immediate and permanent discontinuation of any and all prescriptions being written by our practice. The use of any illegal  substances is prohibited. Adopted CDC guidelines & recommendations: Target dosing levels will be at or below 60 MME/day. Use of benzodiazepines** is not recommended.  Exceptions: There are only two exceptions to the rule of not receiving pain medications from other Healthcare Providers. Exception #1 (Emergencies): In the event of an emergency (i.e.: accident requiring emergency care), you are allowed to receive additional pain medication. However, you are responsible for: As soon as you are able, call our office [phone], at any time of the day or night, and leave a message stating your name, the date and nature of the emergency, and the name and dose of the medication prescribed. In the event that your call is answered by a member of our staff, make sure to document and save the date, time, and the name of the person that took your information.  Exception #2 (Planned Surgery): In the event that you are scheduled by another doctor or dentist to have any type of surgery or procedure, you are allowed (for a period no longer than 30 days), to receive additional pain medication, for the acute post-op pain. However, in this case, you are responsible for picking up a copy of our "Post-op Pain Management for Surgeons" handout, and giving it to your surgeon or dentist. This document is available at our office, and does not require an appointment to obtain it. Simply go to our office during business hours (Monday-Thursday  from 8:00 AM to 4:00 PM) (Friday 8:00 AM to 12:00 Noon) or if you have a scheduled appointment with [place], prior to your surgery, and ask for it by name. In addition, you are responsible for: calling our office [phone], at any time of the day or night, and leaving a message stating your name, name of your surgeon, type of surgery, and date of procedure or surgery. Failure to comply with your responsibilities may result in termination of therapy involving the controlled substances. Medication  Agreement Violation. Following the above rules, including your responsibilities will help you in avoiding a Medication Agreement Violation ("Breaking your Pain Medication Contract").  Consequences:  Not following the above rules may result in permanent discontinuation of medication prescription therapy.  *Opioid medications include: morphine, codeine, oxycodone, oxymorphone, hydrocodone, hydromorphone, meperidine, tramadol, tapentadol, buprenorphine, fentanyl, methadone. **Benzodiazepine medications include: diazepam (Valium), alprazolam (Xanax), clonazepam (Klonopine), lorazepam (Ativan), clorazepate (Tranxene), chlordiazepoxide (Librium), estazolam (Prosom), oxazepam (Serax), temazepam (Restoril), triazolam (Halcion) (Last updated: 04/28/2022) ______________________________________________________________________      ______________________________________________________________________    Medication Recommendations and Reminders  Applies to: All patients receiving prescriptions (written and/or electronic).  Medication Rules & Regulations: You are responsible for reading, knowing, and following our "Medication Rules" document. These exist for your safety and that of others. They are not flexible and neither are we. Dismissing or ignoring them is an act of "non-compliance" that may result in complete and irreversible termination of such medication therapy. For safety reasons, "non-compliance" will not be tolerated. As with the [place] fundamental legal principle of "ignorance of the law is no defense", we will accept no excuses for not having read and knowing the content of documents provided to you by our practice.  Pharmacy of record:  Definition: This is the pharmacy where your electronic prescriptions will be sent.  We do not endorse any particular pharmacy. It is up to you and your insurance to decide what pharmacy to use.  We do not restrict you in your choice of pharmacy. However, once we  write for your prescriptions, we will NOT be re-sending more prescriptions to fix restricted supply problems created by your pharmacy, or your insurance.  The pharmacy listed in the electronic medical record should be the one where you want electronic prescriptions to be sent. If you choose to change pharmacy, simply notify our nursing staff. Changes will be made only during your regular appointments and not over the phone.  Recommendations: Keep all of your pain medications in a safe place, under lock and key, even if you live alone. We will NOT replace lost, stolen, or damaged medication. We do not accept "Police Reports" as proof of medications having been stolen. After you fill your prescription, take 1 week's worth of pills and put them away in a safe place. You should keep a separate, properly labeled bottle for this purpose. The remainder should be kept in the original bottle. Use this as your primary supply, until it runs out. Once it's gone, then you know that you have 1 week's worth of medicine, and it is time to come in for a prescription refill. If you do this correctly, it is unlikely that you will ever run out of medicine. To make sure that the above recommendation works, it is very important that you make sure your medication refill appointments are scheduled at least 1 week before you run out of medicine. To do this in an effective manner, make sure that you do not leave the office without scheduling your  next medication management appointment. Always ask the nursing staff to show you in your prescription , when your medication will be running out. Then arrange for the receptionist to get you a return appointment, at least 7 days before you run out of medicine. Do not wait until you have 1 or 2 pills left, to come in. This is very poor planning and does not take into consideration that we may need to cancel appointments due to bad weather, sickness, or emergencies affecting our staff. DO NOT  ACCEPT A "Partial Fill": If for any reason your pharmacy does not have enough pills/tablets to completely fill or refill your prescription, do not allow for a "partial fill". The law allows the pharmacy to complete that prescription within 72 hours, without requiring a new prescription. If they do not fill the rest of your prescription within those 72 hours, you will need a separate prescription to fill the remaining amount, which we will NOT provide. If the reason for the partial fill is your insurance, you will need to talk to the pharmacist about payment alternatives for the remaining tablets, but again, DO NOT ACCEPT A PARTIAL FILL, unless you can trust your pharmacist to obtain the remainder of the pills within 72 hours.  Prescription refills and/or changes in medication(s):  Prescription refills, and/or changes in dose or medication, will be conducted only during scheduled medication management appointments. (Applies to both, written and electronic prescriptions.) No refills on procedure days. No medication will be changed or started on procedure days. No changes, adjustments, and/or refills will be conducted on a procedure day. Doing so will interfere with the diagnostic portion of the procedure. No phone refills. No medications will be "called into the pharmacy". No Fax refills. No weekend refills. No Holliday refills. No after hours refills.  Remember:  Business hours are:  Monday to Thursday 8:00 AM to 4:00 PM Provider's Schedule: Delano Metz, MD - Appointments are:  Medication management: Monday and Wednesday 8:00 AM to 4:00 PM Procedure day: Tuesday and Thursday 7:30 AM to 4:00 PM Edward Jolly, MD - Appointments are:  Medication management: Tuesday and Thursday 8:00 AM to 4:00 PM Procedure day: Monday and Wednesday 7:30 AM to 4:00 PM (Last update: 04/28/2022) ______________________________________________________________________       ______________________________________________________________________     Naloxone Nasal Spray  Why am I receiving this medication? [place] STOP ACT requires that all patients taking high dose opioids or at risk of opioids respiratory depression, be prescribed an opioid reversal agent, such as Naloxone (AKA: Narcan).  What is this medication? NALOXONE (nal OX one) treats opioid overdose, which causes slow or shallow breathing, severe drowsiness, or trouble staying awake. Call emergency services after using this medication. You may need additional treatment. Naloxone works by reversing the effects of opioids. It belongs to a group of medications called opioid blockers.  COMMON BRAND NAME(S): Kloxxado, Narcan  What should I tell my care team before I take this medication? They need to know if you have any of these conditions: Heart disease Substance use disorder An unusual or allergic reaction to naloxone, other medications, foods, dyes, or preservatives Pregnant or trying to get pregnant Breast-feeding  When to use this medication? This medication is to be used for the treatment of respiratory depression (less than 8 breaths per minute) secondary to opioid overdose.   How to use this medication? This medication is for use in the nose. Lay the person on their back. Support their neck with your hand and allow  the head to tilt back before giving the medication. The nasal spray should be given into 1 nostril. After giving the medication, move the person onto their side. Do not remove or test the nasal spray until ready to use. Get emergency medical help right away after giving the first dose of this medication, even if the person wakes up. You should be familiar with how to recognize the signs and symptoms of a narcotic overdose. If more doses are needed, give the additional dose in the other nostril. Talk to your care team about the use of this medication in children. While this  medication may be prescribed for children as young as newborns for selected conditions, precautions do apply.  Naloxone Overdosage: If you think you have taken too much of this medicine contact a poison control center or emergency room at once.  NOTE: This medicine is only for you. Do not share this medicine with others.  What if I miss a dose? This does not apply.  What may interact with this medication? This is only used during an emergency. No interactions are expected during emergency use. This list may not describe all possible interactions. Give your health care provider a list of all the medicines, herbs, non-prescription drugs, or dietary supplements you use. Also tell them if you smoke, drink alcohol, or use illegal drugs. Some items may interact with your medicine.  What should I watch for while using this medication? Keep this medication ready for use in the case of an opioid overdose. Make sure that you have the phone number of your care team and local hospital ready. You may need to have additional doses of this medication. Each nasal spray contains a single dose. Some emergencies may require additional doses. After use, bring the treated person to the nearest hospital or call 911. Make sure the treating care team knows that the person has received a dose of this medication. You will receive additional instructions on what to do during and after use of this medication before an emergency occurs.  What side effects may I notice from receiving this medication? Side effects that you should report to your care team as soon as possible: Allergic reactions--skin rash, itching, hives, swelling of the face, lips, tongue, or throat Side effects that usually do not require medical attention (report these to your care team if they continue or are bothersome): Constipation Dryness or irritation inside the nose Headache Increase in blood pressure Muscle spasms Stuffy nose Toothache This  list may not describe all possible side effects. Call your doctor for medical advice about side effects. You may report side effects to FDA at 1-800-FDA-1088.  Where should I keep my medication? Because this is an emergency medication, you should keep it with you at all times.  Keep out of the reach of children and pets. Store between 20 and 25 degrees C (68 and 77 degrees F). Do not freeze. Throw away any unused medication after the expiration date. Keep in original box until ready to use.  NOTE: This sheet is a summary. It may not cover all possible information. If you have questions about this medicine, talk to your doctor, pharmacist, or health care provider.   2023 Elsevier/Gold Standard (2021-02-28 00:00:00)  ______________________________________________________________________

## 2023-04-02 NOTE — Progress Notes (Unsigned)
PROVIDER NOTE: Information contained herein reflects review and annotations entered in association with encounter. Interpretation of such information and data should be left to medically-trained personnel. Information provided to patient can be located elsewhere in the medical record under "Patient Instructions". Document created using STT-dictation technology, any transcriptional errors that may result from process are unintentional.    Patient: Derek [name]  Service Category: E/M  Provider: Oswaldo Done, MD  DOB: [birth year]  DOS: 04/07/2023  Referring Provider: Wilford Corner  MRN: [id number]  Specialty: Interventional Pain Management  PCP: Wilford Corner, PA-C  Type: Established Patient  Setting: Ambulatory outpatient    Location: Office  Delivery: Face-to-face     HPI  Derek [name], a 42 y.o. year old male, is here today because of his No primary diagnosis found.. Derek [name] primary complain today is No chief complaint on file.  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); Somatic dysfunction of sacroiliac joints (Bilateral); Chronic hip pain (Bilateral); Epidural fibrosis; Lumbar postlaminectomy syndrome; DDD (degenerative disc disease), lumbosacral; Lumbar facet syndrome (Bilateral) (L>R); Other specified dorsopathies, sacral and sacrococcygeal region; Spondylosis without myelopathy or radiculopathy, lumbosacral region; Neurogenic pain; Osteoarthritic spondylosis of lumbar spine; Tendinitis of elbows (Bilateral); Lateral epicondylitis of elbows (Bilateral); Chronic elbow pain  (Right); Chronic low back pain (Left) w/o sciatica; Numbness of anterior thigh (Right); Burning pain in thigh (Right); Chronic low back pain (Midline) w/o sciatica; Acute exacerbation of chronic low back pain; Chronic elbow pain (Bilateral); Radial collateral ligament sprain of elbow, unspecified laterality, sequela (Bilateral); Enthesopathy of elbow region (Bilateral); Sprain of lateral collateral ligament of elbow, sequela (Left); Sprain of lateral collateral ligament of elbow, sequela (Right); Radicular pain of lumbosacral region; Lumbosacral radiculopathy; and Chronic low back pain (Bilateral) w/o sciatica on their pertinent problem list. Pain Assessment: Severity of   is reported as a  /10. Location:    / . Onset:  . Quality:  . Timing:  . Modifying factor(s):  Marland Kitchen Vitals:  vitals were not taken for this visit.  BMI: Estimated body mass index is 25.54 kg/m as calculated from the following:   Height as of 01/11/23: 5\' 10"  (1.778 m).   Weight as of 01/11/23: 178 lb (80.7 kg). Last encounter: 01/11/2023. Last procedure: 10/01/2022.  Reason for encounter: medication management. ***  RTCB: 07/10/2023   Pharmacotherapy Assessment  Analgesic: Oxycodone IR 10 mg, 1 tab PO q 6 hrs (40 mg/day of oxycodone) MME/day: 60 mg/day.   Monitoring: [place] PMP: PDMP reviewed during this encounter.       Pharmacotherapy: No side-effects or adverse reactions reported. Compliance: No problems identified. Effectiveness: Clinically acceptable.  No notes on file  No results found for: "CBDTHCR" No results found for: "D8THCCBX" No results found for: "D9THCCBX"  UDS:  Summary  Date Value Ref Range Status  01/11/2023 Note  Final    Comment:    ==================================================================== ToxASSURE Select 13 (MW) ==================================================================== Specimen Alert ToxAssure, ToxAssure Flex or Mat drug testing: -Engineer, production- Result certification performed  at [facility], [address] [place], [place] [address]. [phone] Lab Director Cherylann Ratel, PhrmD. ==================================================================== Test  Result       Flag       Units  Drug Present and Declared for Prescription Verification   Oxycodone                      1017         EXPECTED   ng/mg creat   Oxymorphone                    5560         EXPECTED   ng/mg creat   Noroxycodone                   2481         EXPECTED   ng/mg creat   Noroxymorphone                 1161         EXPECTED   ng/mg creat    Sources of oxycodone are scheduled prescription medications.    Oxymorphone, noroxycodone, and noroxymorphone are expected    metabolites of oxycodone. Oxymorphone is also available as a    scheduled prescription medication.  ==================================================================== Test                      Result    Flag   Units      Ref Range   Creatinine              161              mg/dL      >=29 ==================================================================== Declared Medications:  The flagging and interpretation on this report are based on the  following declared medications.  Unexpected results may arise from  inaccuracies in the declared medications.   **Note: The testing scope of this panel includes these medications:   Oxycodone   **Note: The testing scope of this panel does not include the  following reported medications:   Calcium  Celecoxib (Celebrex)  Cholecalciferol  Cyclobenzaprine (Flexeril)  Diclofenac  Esomeprazole (Nexium)  Meloxicam (Mobic)  Naloxone (Narcan)  Tizanidine (Zanaflex) ==================================================================== For clinical consultation, please call [phone]. ====================================================================       ROS  Constitutional: Denies any fever or chills Gastrointestinal: No reported  hemesis, hematochezia, vomiting, or acute GI distress Musculoskeletal: Denies any acute onset joint swelling, redness, loss of ROM, or weakness Neurological: No reported episodes of acute onset apraxia, aphasia, dysarthria, agnosia, amnesia, paralysis, loss of coordination, or loss of consciousness  Medication Review  Calcium Carb-Cholecalciferol, Diclofenac Sodium, Oxycodone HCl, celecoxib, cyclobenzaprine, esomeprazole, meloxicam, naloxone, and tiZANidine  History Review  Allergy: Derek [name] is allergic to hydrocodone, amoxicillin, and vicodin [hydrocodone-acetaminophen]. Drug: Derek [name]  reports no history of drug use. Alcohol:  reports current alcohol use. Tobacco:  reports that he has been smoking cigarettes. He has a 16 pack-year smoking history. He has never used smokeless tobacco. Social: Derek [name]  reports that he has been smoking cigarettes. He has a 16 pack-year smoking history. He has never used smokeless tobacco. He reports current alcohol use. He reports that he does not use drugs. Medical:  has a past medical history of Anxiety, Arthritis, and GERD (gastroesophageal reflux disease). Surgical: Derek [name]  has a past surgical history that includes Back surgery; HAND REIMPLANTED; and Lumbar fusion (11/14). Family: family history is not on file.  Laboratory Chemistry Profile   Renal Lab Results  Component Value Date   BUN 11 01/16/2019  CREATININE 1.08 01/16/2019   BCR 10 01/16/2019   GFRAA 99 01/16/2019   GFRNONAA 86 01/16/2019    Hepatic Lab Results  Component Value Date   AST 24 01/16/2019   ALBUMIN 4.6 01/16/2019   ALKPHOS 108 01/16/2019    Electrolytes Lab Results  Component Value Date   NA 143 01/16/2019   K 4.0 01/16/2019   CL 105 01/16/2019   CALCIUM 10.2 01/16/2019   MG 2.0 01/16/2019    Bone Lab Results  Component Value Date   25OHVITD1 39 01/16/2019   25OHVITD2 1.2 01/16/2019   25OHVITD3 38 01/16/2019    Inflammation (CRP: Acute Phase) (ESR:  Chronic Phase) Lab Results  Component Value Date   CRP 4 01/16/2019   ESRSEDRATE 29 (H) 01/16/2019         Note: Above Lab results reviewed.  Recent Imaging Review  DG PAIN CLINIC C-ARM 1-60 MIN NO REPORT Fluoro was used, but no Radiologist interpretation will be provided.  Please refer to "NOTES" tab for provider progress note. Note: Reviewed        Physical Exam  General appearance: Well nourished, well developed, and well hydrated. In no apparent acute distress Mental status: Alert, oriented x 3 (person, place, & time)       Respiratory: No evidence of acute respiratory distress Eyes: PERLA Vitals: There were no vitals taken for this visit. BMI: Estimated body mass index is 25.54 kg/m as calculated from the following:   Height as of 01/11/23: 5\' 10"  (1.778 m).   Weight as of 01/11/23: 178 lb (80.7 kg). Ideal: Patient weight not recorded  Assessment   Diagnosis Status  1. Chronic pain syndrome   2. Pharmacologic therapy   3. DDD (degenerative disc disease), lumbosacral   4. Encounter for medication management   5. Failed back surgical syndrome (x3)   6. Encounter for chronic pain management   7. Chronic use of opiate for therapeutic purpose   8. Chronic lower extremity pain (2ry area of Pain) (Bilateral) (L>R)   9. Chronic low back pain (1ry area of Pain) (Bilateral) (L>R) w/ sciatica (Bilateral)    Controlled Controlled Controlled   Updated Problems: No problems updated.  Plan of Care  Problem-specific:  No problem-specific Assessment & Plan notes found for this encounter.  Derek [name] has a current medication list which includes the following long-term medication(s): calcium carb-cholecalciferol, esomeprazole, meloxicam, oxycodone hcl, and tizanidine.  Pharmacotherapy (Medications Ordered): No orders of the defined types were placed in this encounter.  Orders:  No orders of the defined types were placed in this encounter.  Follow-up plan:   No  follow-ups on file.      Interventional Therapies  Risk Factors  Considerations:      Lumbar Hardware: No RFA    Planned  Pending:      Under consideration:   Possible bilateral lumbar facet RFA  Diagnostic bilateral SI Blk #1  Possible bilateral SI RFA  Possible spinal cord stimulator trial    Completed:   Therapeutic right L2-3 LESI x1 (08/29/2020)  Diagnostic left lumbar facet MBB x2 (01/15/2022) (100/100/75/75)  Diagnostic right lumbar facet MBB x1 (01/15/2022) (100/100/75/75)  Diagnostic left SI Blk x1 (03/21/2020)  Therapeutic/palliative right lateral epicondyle (elbow) inj. x2 (01/21/2021)  Therapeutic/palliative left RACZ procedure x7 (targeting the left S2, S3 area) (12/18/2021) (0/0/50/90) complete relief of the radicular component.  No benefit to the back pain. Therapeutic left RACZ procedure #8 (10/01/2022) (+ saddle block - NO HYPERTONIC injected) (100/100/90/95)  Therapeutic  Palliative (PRN) options:   Palliative right lateral epicondyle (elbow) inj. #2  Palliative left RACZ procedure #8 (targeting the left S2, S3 area)    Pharmacotherapy  Nonopioids transferred 05/02/2020: Zanaflex, Flexeril, and Mobic        Recent Visits Date Type Provider Dept  01/11/23 Office Visit Delano Metz, MD [facility]  Showing recent visits within past 90 days and meeting all other requirements Future Appointments Date Type Provider Dept  04/07/23 Appointment Delano Metz, MD [facility]  Showing future appointments within next 90 days and meeting all other requirements  I discussed the assessment and treatment plan with the patient. The patient was provided an opportunity to ask questions and all were answered. The patient agreed with the plan and demonstrated an understanding of the instructions.  Patient advised to call back or seek an in-person evaluation if the symptoms or condition worsens.  Duration of encounter: *** minutes.  Total  time on encounter, as per AMA guidelines included both the face-to-face and non-face-to-face time personally spent by the physician and/or other qualified health care professional(s) on the day of the encounter (includes time in activities that require the physician or other qualified health care professional and does not include time in activities normally performed by clinical staff). Physician's time may include the following activities when performed: Preparing to see the patient (e.g., pre-charting review of records, searching for previously ordered imaging, lab work, and nerve conduction tests) Review of prior analgesic pharmacotherapies. Reviewing PMP Interpreting ordered tests (e.g., lab work, imaging, nerve conduction tests) Performing post-procedure evaluations, including interpretation of diagnostic procedures Obtaining and/or reviewing separately obtained history Performing a medically appropriate examination and/or evaluation Counseling and educating the patient/family/caregiver Ordering medications, tests, or procedures Referring and communicating with other health care professionals (when not separately reported) Documenting clinical information in the electronic or other health record Independently interpreting results (not separately reported) and communicating results to the patient/ family/caregiver Care coordination (not separately reported)  Note by: Oswaldo Done, MD Date: 04/07/2023; Time: 4:09 PM

## 2023-04-07 ENCOUNTER — Ambulatory Visit: Payer: BC Managed Care – PPO | Attending: Pain Medicine | Admitting: Pain Medicine

## 2023-04-07 ENCOUNTER — Encounter: Payer: Self-pay | Admitting: Pain Medicine

## 2023-04-07 VITALS — BP 142/91 | HR 101 | Temp 97.4°F | Resp 14 | Ht 70.0 in | Wt 185.0 lb

## 2023-04-07 DIAGNOSIS — M51371 Other intervertebral disc degeneration, lumbosacral region with lower extremity pain only: Secondary | ICD-10-CM

## 2023-04-07 DIAGNOSIS — M79604 Pain in right leg: Secondary | ICD-10-CM | POA: Diagnosis present

## 2023-04-07 DIAGNOSIS — S32009K Unspecified fracture of unspecified lumbar vertebra, subsequent encounter for fracture with nonunion: Secondary | ICD-10-CM | POA: Diagnosis present

## 2023-04-07 DIAGNOSIS — R200 Anesthesia of skin: Secondary | ICD-10-CM

## 2023-04-07 DIAGNOSIS — M5442 Lumbago with sciatica, left side: Secondary | ICD-10-CM | POA: Insufficient documentation

## 2023-04-07 DIAGNOSIS — M5441 Lumbago with sciatica, right side: Secondary | ICD-10-CM | POA: Diagnosis present

## 2023-04-07 DIAGNOSIS — M79605 Pain in left leg: Secondary | ICD-10-CM | POA: Diagnosis present

## 2023-04-07 DIAGNOSIS — Z79891 Long term (current) use of opiate analgesic: Secondary | ICD-10-CM | POA: Diagnosis present

## 2023-04-07 DIAGNOSIS — G8929 Other chronic pain: Secondary | ICD-10-CM

## 2023-04-07 DIAGNOSIS — M961 Postlaminectomy syndrome, not elsewhere classified: Secondary | ICD-10-CM | POA: Diagnosis present

## 2023-04-07 DIAGNOSIS — G894 Chronic pain syndrome: Secondary | ICD-10-CM | POA: Diagnosis present

## 2023-04-07 DIAGNOSIS — R52 Pain, unspecified: Secondary | ICD-10-CM | POA: Diagnosis present

## 2023-04-07 DIAGNOSIS — M5417 Radiculopathy, lumbosacral region: Secondary | ICD-10-CM

## 2023-04-07 DIAGNOSIS — M5137 Other intervertebral disc degeneration, lumbosacral region: Secondary | ICD-10-CM

## 2023-04-07 DIAGNOSIS — M960 Pseudarthrosis after fusion or arthrodesis: Secondary | ICD-10-CM

## 2023-04-07 DIAGNOSIS — Z79899 Other long term (current) drug therapy: Secondary | ICD-10-CM

## 2023-04-07 DIAGNOSIS — G96198 Other disorders of meninges, not elsewhere classified: Secondary | ICD-10-CM | POA: Diagnosis present

## 2023-04-07 MED ORDER — OXYCODONE HCL 10 MG PO TABS
10.0000 mg | ORAL_TABLET | Freq: Four times a day (QID) | ORAL | 0 refills | Status: DC | PRN
Start: 2023-04-11 — End: 2023-06-17

## 2023-04-07 MED ORDER — OXYCODONE HCL 10 MG PO TABS
10.0000 mg | ORAL_TABLET | Freq: Four times a day (QID) | ORAL | 0 refills | Status: DC | PRN
Start: 2023-06-10 — End: 2023-06-17

## 2023-04-07 MED ORDER — OXYCODONE HCL 10 MG PO TABS
10.0000 mg | ORAL_TABLET | Freq: Four times a day (QID) | ORAL | 0 refills | Status: DC | PRN
Start: 2023-05-11 — End: 2023-06-17

## 2023-04-07 NOTE — Progress Notes (Signed)
Nursing Pain Medication Assessment:  Safety precautions to be maintained throughout the outpatient stay will include: orient to surroundings, keep bed in low position, maintain call bell within reach at all times, provide assistance with transfer out of bed and ambulation.  Medication Inspection Compliance: Pill count conducted under aseptic conditions, in front of the patient. Neither the pills nor the bottle was removed from the patient's sight at any time. Once count was completed pills were immediately returned to the patient in their original bottle.  Medication: Oxycodone IR Pill/Patch Count:  9 of 120 pills remain Pill/Patch Appearance: Markings consistent with prescribed medication Bottle Appearance: Standard pharmacy container. Clearly labeled. Filled Date: 09 / 06 / 2024 Last Medication intake:  Today

## 2023-04-22 ENCOUNTER — Encounter: Payer: Self-pay | Admitting: Pain Medicine

## 2023-04-22 ENCOUNTER — Ambulatory Visit
Admission: RE | Admit: 2023-04-22 | Discharge: 2023-04-22 | Disposition: A | Discharge status: Home / Self Care | Payer: 59 | Source: Ambulatory Visit | Attending: Pain Medicine | Admitting: Pain Medicine

## 2023-04-22 ENCOUNTER — Ambulatory Visit: Payer: 59 | Attending: Pain Medicine | Admitting: Pain Medicine

## 2023-04-22 VITALS — BP 131/79 | HR 90 | Temp 98.1°F | Resp 13 | Ht 70.0 in | Wt 185.0 lb

## 2023-04-22 DIAGNOSIS — M5442 Lumbago with sciatica, left side: Secondary | ICD-10-CM

## 2023-04-22 DIAGNOSIS — G8929 Other chronic pain: Secondary | ICD-10-CM | POA: Diagnosis not present

## 2023-04-22 DIAGNOSIS — G96198 Other disorders of meninges, not elsewhere classified: Secondary | ICD-10-CM | POA: Insufficient documentation

## 2023-04-22 DIAGNOSIS — M51372 Other intervertebral disc degeneration, lumbosacral region with discogenic back pain and lower extremity pain: Secondary | ICD-10-CM

## 2023-04-22 DIAGNOSIS — M960 Pseudarthrosis after fusion or arthrodesis: Secondary | ICD-10-CM

## 2023-04-22 DIAGNOSIS — M961 Postlaminectomy syndrome, not elsewhere classified: Secondary | ICD-10-CM

## 2023-04-22 DIAGNOSIS — R200 Anesthesia of skin: Secondary | ICD-10-CM

## 2023-04-22 DIAGNOSIS — M51379 Other intervertebral disc degeneration, lumbosacral region without mention of lumbar back pain or lower extremity pain: Secondary | ICD-10-CM | POA: Diagnosis not present

## 2023-04-22 DIAGNOSIS — Z5189 Encounter for other specified aftercare: Secondary | ICD-10-CM

## 2023-04-22 DIAGNOSIS — M545 Low back pain, unspecified: Secondary | ICD-10-CM | POA: Diagnosis present

## 2023-04-22 DIAGNOSIS — M5441 Lumbago with sciatica, right side: Secondary | ICD-10-CM | POA: Diagnosis not present

## 2023-04-22 MED ORDER — FENTANYL CITRATE (PF) 100 MCG/2ML IJ SOLN
25.0000 ug | INTRAMUSCULAR | Status: DC | PRN
  Administered 2023-04-22: 100 ug via INTRAVENOUS

## 2023-04-22 MED ORDER — LIDOCAINE HCL 2 % IJ SOLN
10.0000 mL | Freq: Once | INTRAMUSCULAR | Status: AC
  Administered 2023-04-22: 200 mg
  Filled 2023-04-22: qty 20

## 2023-04-22 MED ORDER — HYALURONIDASE HUMAN 150 UNIT/ML IJ SOLN
1500.0000 [IU] | Freq: Once | INTRAMUSCULAR | Status: AC
  Administered 2023-04-22: 1500 [IU] via INTRADERMAL
  Filled 2023-04-22 (×2): qty 10

## 2023-04-22 MED ORDER — SODIUM CHLORIDE (PF) 0.9 % IJ SOLN
INTRAMUSCULAR | Status: AC
  Filled 2023-04-22: qty 10

## 2023-04-22 MED ORDER — LIDOCAINE-EPINEPHRINE (PF) 2 %-1:200000 IJ SOLN
10.0000 mL | Freq: Once | INTRAMUSCULAR | Status: AC
  Administered 2023-04-22: 10 mL
  Filled 2023-04-22: qty 20

## 2023-04-22 MED ORDER — MIDAZOLAM HCL 5 MG/5ML IJ SOLN
INTRAMUSCULAR | Status: AC
  Filled 2023-04-22: qty 5

## 2023-04-22 MED ORDER — SODIUM CHLORIDE 0.9% FLUSH
4.0000 mL | Freq: Once | INTRAVENOUS | Status: AC
  Administered 2023-04-22: 4 mL

## 2023-04-22 MED ORDER — FENTANYL CITRATE (PF) 100 MCG/2ML IJ SOLN
INTRAMUSCULAR | Status: AC
  Filled 2023-04-22: qty 2

## 2023-04-22 MED ORDER — IOHEXOL 300 MG/ML  SOLN: 15.0000 mL | Freq: Once | INTRAMUSCULAR | Status: DC | PRN

## 2023-04-22 MED ORDER — IOHEXOL 180 MG/ML  SOLN: 10.0000 mL | Freq: Once | INTRAMUSCULAR | Status: DC

## 2023-04-22 MED ORDER — TRIAMCINOLONE ACETONIDE 40 MG/ML IJ SUSP
40.0000 mg | Freq: Once | INTRAMUSCULAR | Status: AC
  Administered 2023-04-22: 40 mg

## 2023-04-22 MED ORDER — TRIAMCINOLONE ACETONIDE 40 MG/ML IJ SUSP
INTRAMUSCULAR | Status: AC
  Filled 2023-04-22: qty 1

## 2023-04-22 MED ORDER — ROPIVACAINE HCL 2 MG/ML IJ SOLN
INTRAMUSCULAR | Status: AC
  Filled 2023-04-22: qty 20

## 2023-04-22 MED ORDER — LACTATED RINGERS IV SOLN: Freq: Once | INTRAVENOUS | Status: AC

## 2023-04-22 MED ORDER — LIDOCAINE HCL 2 % IJ SOLN
INTRAMUSCULAR | Status: AC
  Filled 2023-04-22: qty 20

## 2023-04-22 MED ORDER — MIDAZOLAM HCL 5 MG/5ML IJ SOLN
0.5000 mg | Freq: Once | INTRAMUSCULAR | Status: AC
  Administered 2023-04-22: 2 mg via INTRAVENOUS
  Administered 2023-04-22: 3 mg via INTRAVENOUS

## 2023-04-22 MED ORDER — ROPIVACAINE HCL 2 MG/ML IJ SOLN
4.0000 mL | Freq: Once | INTRAMUSCULAR | Status: AC
  Administered 2023-04-22: 4 mL via EPIDURAL

## 2023-04-22 MED ORDER — PENTAFLUOROPROP-TETRAFLUOROETH EX AERO
INHALATION_SPRAY | Freq: Once | CUTANEOUS | Status: AC
  Administered 2023-04-22: 30 via TOPICAL
  Filled 2023-04-22: qty 116

## 2023-04-22 MED ORDER — CEFAZOLIN SODIUM 1 G IJ SOLR
INTRAMUSCULAR | Status: AC
  Filled 2023-04-22: qty 10

## 2023-04-22 MED ORDER — CEFAZOLIN SODIUM-DEXTROSE 1-4 GM/50ML-% IV SOLN
1.0000 g | INTRAVENOUS | Status: AC
  Administered 2023-04-22: 1 g via INTRAVENOUS

## 2023-04-22 MED ORDER — STERILE WATER FOR INJECTION IJ SOLN
10.0000 mL | Freq: Once | INTRAVENOUS | Status: AC
  Administered 2023-04-22: 10 mL via EPIDURAL
  Filled 2023-04-22 (×2): qty 4.27

## 2023-04-22 MED ORDER — IOHEXOL 180 MG/ML  SOLN
INTRAMUSCULAR | Status: AC
  Filled 2023-04-22: qty 10

## 2023-04-22 NOTE — Patient Instructions (Signed)

## 2023-04-22 NOTE — Progress Notes (Signed)
9604 MD injected   ropi,kenalog, Nacl, flushed with saline @0932  1014 MD injectied  1020 cathether removed by MD

## 2023-04-22 NOTE — Addendum Note (Signed)
Addended by: Delano Metz A on: 04/22/2023 05:06 PM   Modules accepted: Orders

## 2023-04-22 NOTE — Progress Notes (Signed)
Test dose #1 at 0859;  Test dose #2 at 0904;  Hylenex at 0905 ended admin 0909

## 2023-04-22 NOTE — Progress Notes (Signed)
PROVIDER NOTE: Interpretation of information contained herein should be left to medically-trained personnel. Specific patient instructions are provided elsewhere under "Patient Instructions" section of medical record. This document was created in part using STT-dictation technology, any transcriptional errors that may result from this process are unintentional.  Patient: Derek [name] Type: Established DOB: [birth year] MRN: [id number] PCP: Wilford Corner, PA-C  Service: Procedure DOS: 04/22/2023 Setting: Ambulatory Location: Ambulatory outpatient facility Delivery: Face-to-face Provider: Oswaldo Done, MD Specialty: Interventional Pain Management Specialty designation: 09 Location: Outpatient facility Ref. Prov.: Harlon Flor, Jonnie Finner,*       Interventional Therapy   Primary Reason for Visit: Interventional Pain Management Treatment. CC: Back Pain (lower)    Procedure:          Anesthesia, Analgesia, Anxiolysis:  Type: Therapeutic Percutaneous Epidural Neuroplasty and Lysis of Adhesions (RACZ Procedure)   #9   Region: Caudal Level: Sacrococcygeal   Laterality: Midline aiming at the left  Anesthesia: Local (1-2% Lidocaine)  Anxiolysis: IV Versed 5 mg Sedation: Moderate Fentanyl 2 mL (100 mcg) Guidance: Fluoroscopy Spinal ([id number])   Position: Prone   1. Chronic low back pain (1ry area of Pain) (Bilateral) (L>R) w/ sciatica (Bilateral)   2. Chronic lower extremity pain (2ry area of Pain) (Bilateral) (L>R)   3. Degeneration of intervertebral disc of lumbosacral region with discogenic back pain and lower extremity pain   4. Failed back surgical syndrome (x3)   5. Epidural fibrosis   6. L5-S1 pseudoarthrosis   7. Numbness of anterior thigh (Right)    NAS-11 Pain score:   Pre-procedure: 6 /10   Post-procedure: 0-No pain/10      H&P (Pre-op Assessment):  Derek [name] is a 42 y.o. (year old), male patient, seen today for interventional treatment. He  has a past  surgical history that includes Back surgery; HAND REIMPLANTED; and Lumbar fusion (11/14). Derek [name] has a current medication list which includes the following prescription(s): calcium carb-cholecalciferol, celecoxib, cyclobenzaprine, diclofenac sodium, esomeprazole, naloxone, oxycodone hcl, [START ON 05/11/2023] oxycodone hcl, [START ON 06/10/2023] oxycodone hcl, and tizanidine, and the following Facility-Administered Medications: fentanyl and iohexol. His primarily concern today is the Back Pain (lower)  Initial Vital Signs:  Pulse/HCG Rate: 90ECG Heart Rate: 83 Temp: 98.1 F (36.7 C) Resp: 15 BP: (!) 131/99 SpO2: 99 %  BMI: Estimated body mass index is 26.54 kg/m as calculated from the following:   Height as of this encounter: 5\' 10"  (1.778 m).   Weight as of this encounter: 185 lb (83.9 kg).  Risk Assessment: Allergies: Reviewed. He is allergic to hydrocodone, amoxicillin, and vicodin [hydrocodone-acetaminophen].  Allergy Precautions: None required Coagulopathies: Reviewed. None identified.  Blood-thinner therapy: None at this time Active Infection(s): Reviewed. None identified. Derek [name] is afebrile  Site Confirmation: Derek [name] was asked to confirm the procedure and laterality before marking the site Procedure checklist: Completed Consent: Before the procedure and under the influence of no sedative(s), amnesic(s), or anxiolytics, the patient was informed of the treatment options, risks and possible complications. To fulfill our ethical and legal obligations, as recommended by the American Medical Association's Code of Ethics, I have informed the patient of my clinical impression; the nature and purpose of the treatment or procedure; the risks, benefits, and possible complications of the intervention; the alternatives, including doing nothing; the risk(s) and benefit(s) of the alternative treatment(s) or procedure(s); and the risk(s) and benefit(s) of doing nothing. The patient was  provided information about the general risks and possible complications associated with the procedure. These  may include, but are not limited to: failure to achieve desired goals, infection, bleeding, organ or nerve damage, allergic reactions, paralysis, and death. In addition, the patient was informed of those risks and complications associated to Spine-related procedures, such as failure to decrease pain; infection (i.e.: Meningitis, epidural or intraspinal abscess); bleeding (i.e.: epidural hematoma, subarachnoid hemorrhage, or any other type of intraspinal or peri-dural bleeding); organ or nerve damage (i.e.: Any type of peripheral nerve, nerve root, or spinal cord injury) with subsequent damage to sensory, motor, and/or autonomic systems, resulting in permanent pain, numbness, and/or weakness of one or several areas of the body; allergic reactions; (i.e.: anaphylactic reaction); and/or death. Furthermore, the patient was informed of those risks and complications associated with the medications. These include, but are not limited to: allergic reactions (i.e.: anaphylactic or anaphylactoid reaction(s)); adrenal axis suppression; blood sugar elevation that in diabetics may result in ketoacidosis or comma; water retention that in patients with history of congestive heart failure may result in shortness of breath, pulmonary edema, and decompensation with resultant heart failure; weight gain; swelling or edema; medication-induced neural toxicity; particulate matter embolism and blood vessel occlusion with resultant organ, and/or nervous system infarction; and/or aseptic necrosis of one or more joints. Finally, the patient was informed that Medicine is not an exact science; therefore, there is also the possibility of unforeseen or unpredictable risks and/or possible complications that may result in a catastrophic outcome. The patient indicated having understood very clearly. We have given the patient no guarantees  and we have made no promises. Enough time was given to the patient to ask questions, all of which were answered to the patient's satisfaction. Derek [name] has indicated that he wanted to continue with the procedure. Attestation: I, the ordering provider, attest that I have discussed with the patient the benefits, risks, side-effects, alternatives, likelihood of achieving goals, and potential problems during recovery for the procedure that I have provided informed consent. Date  Time: 04/22/2023  8:01 AM  Pre-Procedure Preparation:  Monitoring: As per clinic protocol. Respiration, ETCO2, SpO2, BP, heart rate and rhythm monitor placed and checked for adequate function Safety Precautions: Patient was assessed for positional comfort and pressure points before starting the procedure. Time-out: I initiated and conducted the "Time-out" before starting the procedure, as per protocol. The patient was asked to participate by confirming the accuracy of the "Time Out" information. Verification of the correct person, site, and procedure were performed and confirmed by me, the nursing staff, and the patient. "Time-out" conducted as per Joint Commission's Universal Protocol (UP.01.01.01). Time: 0846 Start Time: 0846 hrs.  Description of Procedure:          Target Area: Caudal Epidural Canal Approach: Midline approach Area Prepped: Entire Posterior Sacrococcygeal Region DuraPrep (Iodine Povacrylex [0.7% available iodine] and Isopropyl Alcohol, 74% w/w) Safety Precautions: Aspiration looking for blood return was conducted prior to all injections. At no point did I inject any substances, as a needle was being advanced. No attempts were made at seeking any paresthesias. Safe injection practices and needle disposal techniques used. Medications properly checked for expiration dates. SDV (single dose vial) medications used. Description of the Procedure: Protocol guidelines were followed. The patient was placed in position  over the fluoroscopy table. Patient assessed for comfort and pressure points before starting procedure. The target area was identified and the area prepped in the usual manner. Skin & deeper tissues infiltrated with local anesthetic. Appropriate amount of time allowed to pass for local anesthetics to take effect. The epidural  needle was then advanced to the target area, via the sacral hiatus, between the sacral cornu. Proper needle placement secured. Negative aspiration confirmed. Step 1: Epidurogram performed by slowly injecting a non-ionic, water-soluble, hypoallergenic, myelogram-compatible radiological contrast. Defect identified and Racz catheter advanced slowly next to rest proximal to it without attempting to perforate or puncture the defect. At this point, the epidural needle was removed. Step 2: Contrast was again injected, this time thru the catheter, under live fluoroscopy, closely observing for vascular uptake or intrathecal spread. Step 3: Once no vascular uptake or intrathecal spread was confirmed, a 2 mL test-dose using 2% PF-Lidocaine with 1:200,000 Epinephrine was injected thru the catheter, while closely monitoring for an increase in heart rate or evidence of spinal anesthesia. Step 4: After waiting over 5 minutes, the patient was assessed for evidence of a spinal block. Step 5: Once I had confirmed that there was no vascular uptake or evidence of intrathecal spread, I then slowly injected 1,500 Units of hyaluronidase, carefully monitoring for allergic reactions. I then waited 10 minutes, using this time to secure the catheter using a sterile benzoin tincture swab and (12mm x 100 mm) steri-strip. After confirming vitals to be stable, the patient was transferred to the recovery area where Derek [name] was kept under constant observation and monitored as per post-sedation protocol. Step 6: After the 10 minutes, I proceeded to slowly inject a steroid solution containing 0.2% MPF-Ropivacaine (4 mL)  + 0.9% PF-NSS (5 mL) + SDV Triamcinolone 40 mg/mL (1 mL), in intermittent fashion, asking for systemic symptoms every 0.5cc of injectate. Step 7:  30 minutes later, a neurological exam was conducted for any evidence of a spinal blockade. Step 8:  After confirming the absence of anesthesia, I slowly injected the 10% PF-Hypertonic Saline, stopping to assess, any time the patient described any discomfort. Once the procedure was completed, I removed the catheter, taking time to show the patient its spring tip, and demonstrating to the patient that none had been left behind. EBL: None Materials & Medications used:  1. Racz Tun-L-Kath (Ep[place]ed, [place], [place]) Catheter. (or similar)(Perifix 20Gx100cm) 2. 2" Foam Tape 3. Benzoin tincture swab 4. Steri-Strip (12mm x 100 mm) 5. Non-occlusive Transparent Dressing ( TegadermT) 6. Bacteriostatic Filter for Epidural Catheter 7. Epidural Kit for 20G catheter Needle(s) used: 20g - 10cm, Tuohy-style epidural needle   Medications used:  1. Skin infiltration: 2.0% Lidocaine (10ml) 2. Test-dose: 1.5 % Lidocaine w/ Epi 1:200,000 (5ml) 3. Epidural Steroid injection:  A). Steroid: Triamcinolone 40 mg/mL (SDV) (1ml) B). Local Anesthetic: 0.2% PF-Ropivacaine (2 mg/mL) (4 mL) C). Dilution agent: 0.9% PF-NSS (injectable saline) (5 mL) 6. Neurolytic Agent: 10% PF-Sodium Chloride (Hypertonic Saline) (10ml) [23.4% PF-Sodium Chloride (4.217mL) + PF-Sterile H2O (5.722mL) = 10% PF-Sodium Chloride (74mL)] 7. Scar softening agent: Hyaluronidase (150 units/mL) x (10 mL) = 1500 Units 8. Radiological Contrast Media: Isovue-M 200 (10 mL)  Vitals:   04/22/23 1020 04/22/23 1030 04/22/23 1040 04/22/23 1050  BP: 131/76 126/80 129/78 131/79  Pulse:      Resp:      Temp:      SpO2: 99% 100% 99% 100%  Weight:      Height:        Start Time: 0846 hrs. End Time: 0913 hrs.  Epidurogram:  Epidurogram flow pattern demonstrated a restricted low at the level of the surgery.  The epidural catheter was placed next to this restriction without attempting to perforate it.  Materials:  Needle(s) Type: Tuohy-style epidural needle Gauge: 17G Length:  3.5-in Medication(s): Please see orders for medications and dosing details.  Imaging Guidance (Spinal):          Type of Imaging Technique: Fluoroscopy Guidance (Spinal) Indication(s): Assistance in needle guidance and placement for procedures requiring needle placement in or near specific anatomical locations not easily accessible without such assistance. Exposure Time: Please see nurses notes. Contrast: Before injecting any contrast, we confirmed that the patient did not have an allergy to iodine, shellfish, or radiological contrast. Once satisfactory needle placement was completed at the desired level, radiological contrast was injected. Contrast injected under live fluoroscopy. No contrast complications. See chart for type and volume of contrast used. Fluoroscopic Guidance: I was personally present during the use of fluoroscopy. "Tunnel Vision Technique" used to obtain the best possible view of the target area. Parallax error corrected before commencing the procedure. "Direction-depth-direction" technique used to introduce the needle under continuous pulsed fluoroscopy. Once target was reached, antero-posterior, oblique, and lateral fluoroscopic projection used confirm needle placement in all planes. Images permanently stored in EMR. Interpretation: I personally interpreted the imaging intraoperatively. Adequate needle placement confirmed in multiple planes. Appropriate spread of contrast into desired area was observed. No evidence of afferent or efferent intravascular uptake. No intrathecal or subarachnoid spread observed. Permanent images saved into the patient's record.  Antibiotic Prophylaxis:   Anti-infectives (From admission, onward)    Start     Dose/Rate Route Frequency Ordered Stop   04/22/23 0800  ceFAZolin (ANCEF)  IVPB 1 g/50 mL premix        1 g 100 mL/hr over 30 Minutes Intravenous 30 min pre-op 04/22/23 0800 04/22/23 0842      Indication(s): None identified  Post-operative Assessment:  Post-procedure Vital Signs:  Pulse/HCG Rate: 9078 Temp: 98.1 F (36.7 C) Resp: 13 BP: 131/79 SpO2: 100 %  EBL: None  Complications: No immediate post-treatment complications observed by team, or reported by patient.  Note: The patient tolerated the entire procedure well. A repeat set of vitals were taken after the procedure and the patient was kept under observation following institutional policy, for this type of procedure. Post-procedural neurological assessment was performed, showing return to baseline, prior to discharge. The patient was provided with post-procedure discharge instructions, including a section on how to identify potential problems. Should any problems arise concerning this procedure, the patient was given instructions to immediately contact us, at any time, without hesitation. In any case, we plan to contact the patient by telephone for a follow-up status report regarding this interventional procedure.  Comments:  No additional relevant information.  Plan of Care (POC)  Orders:  Orders Placed This Encounter  Procedures   Racz (One Day)    Equipment: Epidural Tray; Racz Catheter; 10% Hypertonic Saline; Hyaluronidase; 3/4" Steri-strips; 4x4 Sterile Gauze pack.    Scheduling Instructions:     Procedure: RACZ Epidural Lysis of Adhesions     Side: Midline     Sedation: Moderate Conscious Sedation     Timeframe: Today   DG PAIN CLINIC C-ARM 1-60 MIN NO REPORT    Intraoperative interpretation by procedural physician at [facility].    Standing Status:   Standing    Number of Occurrences:   1    Order Specific Question:   Reason for exam:    Answer:   Assistance in needle guidance and placement for procedures requiring needle placement in or near specific anatomical locations  not easily accessible without such assistance.   Informed Consent Details: Physician/Practitioner Attestation; Transcribe to consent form and  obtain patient signature    Nursing Order: Transcribe to consent form and obtain patient signature. Note: Always confirm laterality of pain with Derek [name], before procedure.    Order Specific Question:   Physician/Practitioner attestation of informed consent for procedure/surgical case    Answer:   I, the physician/practitioner, attest that I have discussed with the patient the benefits, risks, side effects, alternatives, likelihood of achieving goals and potential problems during recovery for the procedure that I have provided informed consent.    Order Specific Question:   Procedure    Answer:   RACZ Procedure (Epidural Lysis of Adhesions)    Order Specific Question:   Physician/Practitioner performing the procedure    Answer:   Leandra Vanderweele A. Laban Emperor, MD    Order Specific Question:   Indication/Reason    Answer:   Chronic low back pain and lower extremity pain secondary to epidural fibrosis associated with a failed back surgical syndrome   Provide equipment / supplies at bedside    Procedural tray: Epidural Tray (Disposable  single use) Skin infiltration needle: Regular 1.5-in, 25-G, (x1) Block needle size: Regular standard Catheter: RACZ catheter    Standing Status:   Standing    Number of Occurrences:   1    Order Specific Question:   Specify    Answer:   Epidural Tray   Verify informed consent Please verify that the patient has received an appropriate explanation of the procedure, including the risks and possible complications. Make sure that all of the patients questions have been answered to the patient's satis...    Please verify that the patient has received an appropriate explanation of the procedure, including the risks and possible complications. Make sure that all of the patients questions have been answered to the patient's satisfaction.     Standing Status:   Standing    Number of Occurrences:   1   Discharge instructions    Please evaluate and confirm that the patient's neurological status is back to preoperative levels. Confirmed that the patient can void before discharge. Immediately notify the attending physician if the patient is unable to void or if the patient's neurological status shows any deficits. Please provide patient with discharge instructions after the procedure. Scheduled the patient to return for a follow-up 2 weeks after the procedure.    Standing Status:   Standing    Number of Occurrences:   1   Follow-up    Please evaluate the patient's condition and answer any questions that they may have regarding the postoperative care.    Standing Status:   Standing    Number of Occurrences:   1    Order Specific Question:   Specify    Answer:   Call the patient the day after the procedure to evaluate their postoperative status.   Chronic Opioid Analgesic:  Oxycodone IR 10 mg, 1 tab PO q 6 hrs (40 mg/day of oxycodone) MME/day: 60 mg/day.   Medications ordered for procedure: Meds ordered this encounter  Medications   pentafluoroprop-tetrafluoroeth (GEBAUERS) aerosol   lactated ringers infusion   midazolam (VERSED) 5 MG/5ML injection 0.5-2 mg    Make sure Flumazenil is available in the pyxis when using this medication. If oversedation occurs, administer 0.2 mg IV over 15 sec. If after 45 sec no response, administer 0.2 mg again over 1 min; may repeat at 1 min intervals; not to exceed 4 doses (1 mg)   fentaNYL (SUBLIMAZE) injection 25-50 mcg    Make sure Narcan is available  in the pyxis when using this medication. In the event of respiratory depression (RR< 8/min): Titrate NARCAN (naloxone) in increments of 0.1 to 0.2 mg IV at 2-3 minute intervals, until desired degree of reversal.   ceFAZolin (ANCEF) IVPB 1 g/50 mL premix    Order Specific Question:   Antibiotic Indication:    Answer:   Surgical Prophylaxis    lidocaine-EPINEPHrine (XYLOCAINE W/EPI) 2 %-1:200000 (PF) injection 10 mL    If 2% is unavailable, may be substituted with 1.5%, but must be preservative-free. If 1:200,000 concentration of epinephrine is not available, it may be substituted with 1:100,000. Notify physician of changes, before procedure.   triamcinolone acetonide (KENALOG-40) injection 40 mg   iohexol (OMNIPAQUE) 300 MG/ML solution 15 mL   hyaluronidase Human (HYLENEX) injection 1,500 Units    10 mL of the 150 Units/mL   sodium chloride hypertonic 10% epidural injection    For Racz Epidural Lysis of Adhesions.   sodium chloride flush (NS) 0.9 % injection 4 mL   lidocaine (XYLOCAINE) 2 % (with pres) injection 200 mg    If 2.0 % is unavailable, it may be substituted with 1.5 %. Always notify physician of changes before procedure.   ropivacaine (PF) 2 mg/mL (0.2%) (NAROPIN) injection 4 mL   Medications administered: We administered pentafluoroprop-tetrafluoroeth, lactated ringers, midazolam, fentaNYL, ceFAZolin, lidocaine-EPINEPHrine, triamcinolone acetonide, hyaluronidase Human, sodium chloride hypertonic 10% epidural injection, sodium chloride flush, lidocaine, and ropivacaine (PF) 2 mg/mL (0.2%).  See the medical record for exact dosing, route, and time of administration.  Follow-up plan:   Return in 2 weeks (on 05/06/2023) for (Face2F), (PPE).       Interventional Therapies  Risk Factors  Considerations:      Lumbar Hardware: No RFA    Planned  Pending:   Therapeutic left RACZ procedure #9 (04/22/2023)    Under consideration:   Possible bilateral lumbar facet RFA  Diagnostic bilateral SI Blk #1  Possible bilateral SI RFA  Possible spinal cord stimulator trial    Completed:   Therapeutic right L2-3 LESI x1 (08/29/2020)  Diagnostic left lumbar facet MBB x2 (01/15/2022) (100/100/75/75)  Diagnostic right lumbar facet MBB x1 (01/15/2022) (100/100/75/75)  Diagnostic left SI Blk x1 (03/21/2020)   Therapeutic/palliative right lateral epicondyle (elbow) inj. x2 (01/21/2021)  Therapeutic/palliative left RACZ procedure x8 (targeting the left S2, S3 area) (10/01/2022) (100/100/90/90)  Therapeutic left RACZ procedure #8 (10/01/2022) (+ saddle block - NO HYPERTONIC injected) (100/100/90/95)    Therapeutic  Palliative (PRN) options:   Palliative right lateral epicondyle (elbow) inj. #2  Palliative left RACZ procedure #8 (targeting the left S2, S3 area)    Pharmacotherapy  Nonopioids transferred 05/02/2020: Zanaflex, Flexeril, and Mobic        Recent Visits Date Type Provider Dept  04/07/23 Office Visit Delano Metz, MD [facility]  Showing recent visits within past 90 days and meeting all other requirements Today's Visits Date Type Provider Dept  04/22/23 Procedure visit Delano Metz, MD [facility]  Showing today's visits and meeting all other requirements Future Appointments Date Type Provider Dept  05/06/23 Appointment Delano Metz, MD [facility]  07/05/23 Appointment Delano Metz, MD [facility]  Showing future appointments within next 90 days and meeting all other requirements  Disposition: Discharge home  Discharge (Date  Time): 04/22/2023; 1055 hrs.   Primary Care Physician: Wilford Corner, PA-C Location: [facility] Note by: Oswaldo Done, MD (TTS technology used. I apologize for any typographical errors that were  not detected and corrected.) Date: 04/22/2023; Time: 12:21 PM  Disclaimer:  Medicine is not an Visual merchandiser. The only guarantee in medicine is that nothing is guaranteed. It is important to note that the decision to proceed with this intervention was based on the information collected from the patient. The Data and conclusions were drawn from the patient's questionnaire, the interview, and the physical examination. Because the information was provided  in large part by the patient, it cannot be guaranteed that it has not been purposely or unconsciously manipulated. Every effort has been made to obtain as much relevant data as possible for this evaluation. It is important to note that the conclusions that lead to this procedure are derived in large part from the available data. Always take into account that the treatment will also be dependent on availability of resources and existing treatment guidelines, considered by other Pain Management Practitioners as being common knowledge and practice, at the time of the intervention. For Medico-Legal purposes, it is also important to point out that variation in procedural techniques and pharmacological choices are the acceptable norm. The indications, contraindications, technique, and results of the above procedure should only be interpreted and judged by a Board-Certified Interventional Pain Specialist with extensive familiarity and expertise in the same exact procedure and technique.

## 2023-04-22 NOTE — Progress Notes (Signed)
Safety precautions to be maintained throughout the outpatient stay will include: orient to surroundings, keep bed in low position, maintain call bell within reach at all times, provide assistance with transfer out of bed and ambulation.  

## 2023-04-23 ENCOUNTER — Telehealth: Payer: Self-pay

## 2023-04-23 NOTE — Telephone Encounter (Signed)
Post procedure follow up..  Patient states he is doing good 

## 2023-05-05 NOTE — Progress Notes (Signed)
PROVIDER NOTE: Information contained herein reflects review and annotations entered in association with encounter. Interpretation of such information and data should be left to medically-trained personnel. Information provided to patient can be located elsewhere in the medical record under "Patient Instructions". Document created using STT-dictation technology, any transcriptional errors that may result from process are unintentional.    Patient: Derek [name]  Service Category: E/M  Provider: Oswaldo Done, MD  DOB: 1981-01-01  DOS: 05/06/2023  Referring Provider: Wilford Corner  MRN: [id number]  Specialty: Interventional Pain Management  PCP: Wilford Corner, PA-C  Type: Established Patient  Setting: Ambulatory outpatient    Location: Office  Delivery: Face-to-face     HPI  Derek [name], a 42 y.o. year old male, is here today because of his Chronic bilateral low back pain with bilateral sciatica [M54.42, M54.41, G89.29]. [name] primary complain today is Back Pain (Lumbar bilateral )  Pertinent problems: [name] has Lumbar pseudoarthrosis (L5-S1); Chronic low back pain (1ry area of Pain) (Bilateral) (L>R) w/ sciatica (Bilateral); Chronic lower extremity pain (2ry area of Pain) (Bilateral) (L>R); Chronic pain syndrome; Failed back surgical syndrome (x3); L5-S1 pseudoarthrosis; Chronic musculoskeletal pain; Spasm of back muscles; Sacroiliac joint dysfunction (Bilateral); Chronic sacroiliac joint pain (Bilateral) (L>R); Somatic dysfunction of sacroiliac joints (Bilateral); Chronic hip pain (Bilateral); Epidural fibrosis; Lumbar postlaminectomy syndrome; DDD (degenerative disc disease), lumbosacral; Lumbar facet syndrome (Bilateral) (L>R); Other specified dorsopathies, sacral and sacrococcygeal region; Spondylosis without myelopathy or radiculopathy, lumbosacral region; Neurogenic pain; Osteoarthritic spondylosis of lumbar spine; Chronic low back pain (Left) w/o sciatica;  Numbness of anterior thigh (Right); Burning pain in thigh (Right); Chronic low back pain (Midline) w/o sciatica; Enthesopathy of elbow region (Bilateral); Sprain of lateral collateral ligament of elbow, sequela (Left); Sprain of lateral collateral ligament of elbow, sequela (Right); Radicular pain of lumbosacral region; Lumbosacral radiculopathy; and Chronic low back pain (Bilateral) w/o sciatica on their pertinent problem list. Pain Assessment: Severity of Chronic pain is reported as a 5 /10. Location: Back Lower, Right/denies. Onset: More than a month ago. Quality: Pressure, Discomfort, Constant. Timing: Constant. Modifying factor(s): nothing currently. Vitals:  height is 5\' 10"  (1.778 m) and weight is 185 lb (83.9 kg). His temporal temperature is 99.3 F (37.4 C). His blood pressure is 143/94 (abnormal) and his pulse is 110 (abnormal). His respiration is 16 and oxygen saturation is 97%.  BMI: Estimated body mass index is 26.54 kg/m as calculated from the following:   Height as of this encounter: 5\' 10"  (1.778 m).   Weight as of this encounter: 185 lb (83.9 kg). Last encounter: 04/07/2023. Last procedure: 04/22/2023.  Reason for encounter: post-procedure evaluation and assessment. He indicates the leg pain to be gone, but he is still having the lower back. This is likely from his lumbar facet arthropathy. He also indicates that it usually takes a while to improve.   Post-procedure evaluation    Procedure:          Anesthesia, Analgesia, Anxiolysis:  Type: Therapeutic Percutaneous Epidural Neuroplasty and Lysis of Adhesions (RACZ Procedure)   #9   Region: Caudal Level: Sacrococcygeal   Laterality: Midline aiming at the left  Anesthesia: Local (1-2% Lidocaine)  Anxiolysis: IV Versed 5 mg Sedation: Moderate Fentanyl 2 mL (100 mcg) Guidance: Fluoroscopy Spinal ([id number])   Position: Prone   1. Chronic low back pain (1ry area of Pain) (Bilateral) (L>R) w/ sciatica (Bilateral)   2. Chronic  lower extremity pain (2ry area of Pain) (Bilateral) (L>R)  3. Degeneration of intervertebral disc of lumbosacral region with discogenic back pain and lower extremity pain   4. Failed back surgical syndrome (x3)   5. Epidural fibrosis   6. L5-S1 pseudoarthrosis   7. Numbness of anterior thigh (Right)    NAS-11 Pain score:   Pre-procedure: 6 /10   Post-procedure: 0-No pain/10      Effectiveness:  Initial hour after procedure: 100 %. Subsequent 4-6 hours post-procedure: 100 % (good through hour 5 and numbness wore off and pain returned.). Analgesia past initial 6 hours: 50 % (leg pain is better but lower back continues to hurt at a constant rate, describedf as pressure,  nothing seems to help). Ongoing improvement:  Analgesic:  He indicates the leg pain to be gone, but he is still having the lower back. This is likely from his lumbar facet arthropathy. Function: Somewhat improved ROM: Somewhat improved  Pharmacotherapy Assessment  Analgesic: Oxycodone IR 10 mg, 1 tab PO q 6 hrs (40 mg/day of oxycodone) MME/day: 60 mg/day.   Monitoring: [place] PMP: PDMP reviewed during this encounter.       Pharmacotherapy: No side-effects or adverse reactions reported. Compliance: No problems identified. Effectiveness: Clinically acceptable.  Vernie Ammons, RN  05/06/2023  2:12 PM  Sign when Signing Visit Safety precautions to be maintained throughout the outpatient stay will include: orient to surroundings, keep bed in low position, maintain call bell within reach at all times, provide assistance with transfer out of bed and ambulation.     No results found for: "CBDTHCR" No results found for: "D8THCCBX" No results found for: "D9THCCBX"  UDS:  Summary  Date Value Ref Range Status  01/11/2023 Note  Final    Comment:    ==================================================================== ToxASSURE Select 13 (MW) ==================================================================== Specimen  Alert ToxAssure, ToxAssure Flex or Mat drug testing: -Engineer, production- Result certification performed at [facility], [address] [place], [place] [address]. [phone] Lab Director Cherylann Ratel, PhrmD. ==================================================================== Test                             Result       Flag       Units  Drug Present and Declared for Prescription Verification   Oxycodone                      1017         EXPECTED   ng/mg creat   Oxymorphone                    5560         EXPECTED   ng/mg creat   Noroxycodone                   2481         EXPECTED   ng/mg creat   Noroxymorphone                 1161         EXPECTED   ng/mg creat    Sources of oxycodone are scheduled prescription medications.    Oxymorphone, noroxycodone, and noroxymorphone are expected    metabolites of oxycodone. Oxymorphone is also available as a    scheduled prescription medication.  ==================================================================== Test                      Result    Flag   Units  Ref Range   Creatinine              161              mg/dL      >=34 ==================================================================== Declared Medications:  The flagging and interpretation on this report are based on the  following declared medications.  Unexpected results may arise from  inaccuracies in the declared medications.   **Note: The testing scope of this panel includes these medications:   Oxycodone   **Note: The testing scope of this panel does not include the  following reported medications:   Calcium  Celecoxib (Celebrex)  Cholecalciferol  Cyclobenzaprine (Flexeril)  Diclofenac  Esomeprazole (Nexium)  Meloxicam (Mobic)  Naloxone (Narcan)  Tizanidine (Zanaflex) ==================================================================== For clinical consultation, please call [phone]. ====================================================================       ROS  Constitutional: Denies any fever or chills Gastrointestinal: No reported hemesis, hematochezia, vomiting, or acute GI distress Musculoskeletal: Denies any acute onset joint swelling, redness, loss of ROM, or weakness Neurological: No reported episodes of acute onset apraxia, aphasia, dysarthria, agnosia, amnesia, paralysis, loss of coordination, or loss of consciousness  Medication Review  Calcium Carb-Cholecalciferol, Diclofenac Sodium, Oxycodone HCl, celecoxib, cyclobenzaprine, esomeprazole, and tiZANidine  History Review  Allergy: Mr. Mclauchlan is allergic to hydrocodone, amoxicillin, and vicodin [hydrocodone-acetaminophen]. Drug: Mr. Abood  reports no history of drug use. Alcohol:  reports current alcohol use. Tobacco:  reports that he has been smoking cigarettes. He has a 16 pack-year smoking history. He has quit using smokeless tobacco. Social: Mr. Seefried  reports that he has been smoking cigarettes. He has a 16 pack-year smoking history. He has quit using smokeless tobacco. He reports current alcohol use. He reports that he does not use drugs. Medical:  has a past medical history of Anxiety, Arthritis, and GERD (gastroesophageal reflux disease). Surgical: Mr. Mamone  has a past surgical history that includes Back surgery; HAND REIMPLANTED; and Lumbar fusion (11/14). Family: family history is not on file.  Laboratory Chemistry Profile   Renal Lab Results  Component Value Date   BUN 11 01/16/2019   CREATININE 1.08 01/16/2019   BCR 10 01/16/2019   GFRAA 99 01/16/2019   GFRNONAA 86 01/16/2019    Hepatic Lab Results  Component Value Date   AST 24 01/16/2019   ALBUMIN 4.6 01/16/2019   ALKPHOS 108 01/16/2019    Electrolytes Lab Results  Component Value Date   NA 143 01/16/2019   K 4.0 01/16/2019   CL 105 01/16/2019   CALCIUM 10.2 01/16/2019   MG 2.0 01/16/2019    Bone Lab Results   Component Value Date   25OHVITD1 39 01/16/2019   25OHVITD2 1.2 01/16/2019   25OHVITD3 38 01/16/2019    Inflammation (CRP: Acute Phase) (ESR: Chronic Phase) Lab Results  Component Value Date   CRP 4 01/16/2019   ESRSEDRATE 29 (H) 01/16/2019         Note: Above Lab results reviewed.  Recent Imaging Review  DG PAIN CLINIC C-ARM 1-60 MIN NO REPORT Fluoro was used, but no Radiologist interpretation will be provided.  Please refer to "NOTES" tab for provider progress note. Note: Reviewed        Physical Exam  General appearance: Well nourished, well developed, and well hydrated. In no apparent acute distress Mental status: Alert, oriented x 3 (person, place, & time)       Respiratory: No evidence of acute respiratory distress Eyes: PERLA Vitals: BP (!) 143/94 (BP Location: Left Arm, Patient Position: Sitting, Cuff  Size: Normal)   Pulse (!) 110   Temp 99.3 F (37.4 C) (Temporal)   Resp 16   Ht 5\' 10"  (1.778 m)   Wt 185 lb (83.9 kg)   SpO2 97%   BMI 26.54 kg/m  BMI: Estimated body mass index is 26.54 kg/m as calculated from the following:   Height as of this encounter: 5\' 10"  (1.778 m).   Weight as of this encounter: 185 lb (83.9 kg). Ideal: Ideal body weight: 73 kg (160 lb 15 oz) Adjusted ideal body weight: 77.4 kg (170 lb 9 oz)  Assessment   Diagnosis Status  1. Chronic low back pain (1ry area of Pain) (Bilateral) (L>R) w/ sciatica (Bilateral)   2. Chronic lower extremity pain (2ry area of Pain) (Bilateral) (L>R)   3. Degeneration of intervertebral disc of lumbosacral region with discogenic back pain and lower extremity pain   4. Failed back surgical syndrome (x3)   5. Epidural fibrosis   6. L5-S1 pseudoarthrosis   7. Numbness of anterior thigh (Right)   8. Radicular pain of lumbosacral region   9. Burning pain in thigh (Right)   10. Lumbar postlaminectomy syndrome   11. Postop check   12. Chronic low back pain (Bilateral) w/o sciatica   13. Lumbar facet syndrome  (Bilateral) (L>R)    Controlled Controlled Controlled   Updated Problems: No problems updated.  Plan of Care  Problem-specific:  No problem-specific Assessment & Plan notes found for this encounter.  Mr. RODREGUS DANBY has a current medication list which includes the following long-term medication(s): calcium carb-cholecalciferol, esomeprazole, oxycodone hcl, oxycodone hcl, [START ON 06/10/2023] oxycodone hcl, and tizanidine.  Pharmacotherapy (Medications Ordered): No orders of the defined types were placed in this encounter.  Orders:  Orders Placed This Encounter  Procedures   LUMBAR FACET(MEDIAL BRANCH NERVE BLOCK) MBNB    Diagnosis: Lumbar Facet Syndrome (M47.816); Lumbosacral Facet Syndrome (M47.817); Lumbar Facet Joint Pain (M54.59) Medical Necessity Statement: 1.Severe chronic axial low back pain causing functional impairment documented by ongoing pain scale assessments. 2.Pain present for longer than 3 months (Chronic) documented to have failed noninvasive conservative therapies. 3.Absence of untreated radiculopathy. 4.There is no radiological evidence of untreated fractures, tumor, infection, or deformity.  Physical Examination Findings: Positive Kemp Maneuver: (Y)  Positive Lumbar Hyperextension-Rotation provocative test: (Y)    Standing Status:   Standing    Number of Occurrences:   1    Standing Expiration Date:   11/03/2023    Scheduling Instructions:     Procedure: Lumbar facet Block     Type: Medial Branch Block     Side: Bilateral     Purpose: Diagnostic Radiologic Mapping     Level(s): L3-4, L4-5, L5-S1, and TBD by Fluoroscopic Mapping Facets (L2, L3, L4, L5, S1, and TBD Medial Branch)     Sedation: With Sedation.     Timeframe: He will call when he needs it    Order Specific Question:   Where will this procedure be performed?    Answer:   [facility]   Nursing Instructions:    Please complete this patient's postprocedure evaluation.    Scheduling  Instructions:     Please complete this patient's postprocedure evaluation.   Follow-up plan:   Return if symptoms worsen or fail to improve, for (PRN) (ECT): (B) L-FCT Blk.      Interventional Therapies  Risk Factors  Considerations:      Lumbar Hardware: No RFA    Planned  Pending:   Therapeutic  left RACZ procedure #9 (04/22/2023)    Under consideration:   Possible bilateral lumbar facet RFA  Diagnostic bilateral SI Blk #1  Possible bilateral SI RFA  Possible spinal cord stimulator trial    Completed:   Therapeutic right L2-3 LESI x1 (08/29/2020)  Diagnostic left lumbar facet MBB x2 (01/15/2022) (100/100/75/75)  Diagnostic right lumbar facet MBB x1 (01/15/2022) (100/100/75/75)  Diagnostic left SI Blk x1 (03/21/2020)  Therapeutic/palliative right lateral epicondyle (elbow) inj. x2 (01/21/2021)  Therapeutic/palliative left RACZ procedure x8 (targeting the left S2, S3 area) (10/01/2022) (100/100/90/90)  Therapeutic left RACZ procedure #8 (10/01/2022) (+ saddle block - NO HYPERTONIC injected) (100/100/90/95)    Therapeutic  Palliative (PRN) options:   Palliative right lateral epicondyle (elbow) inj. #2  Palliative left RACZ procedure #8 (targeting the left S2, S3 area)    Pharmacotherapy  Nonopioids transferred 05/02/2020: Zanaflex, Flexeril, and Mobic         Recent Visits Date Type Provider Dept  05/06/23 Office Visit Delano Metz, MD [facility]  04/22/23 Procedure visit Delano Metz, MD [facility]  04/07/23 Office Visit Delano Metz, MD [facility]  Showing recent visits within past 90 days and meeting all other requirements Future Appointments Date Type Provider Dept  07/05/23 Appointment Delano Metz, MD [facility]  Showing future appointments within next 90 days and meeting all other requirements  I discussed the assessment and treatment plan with the patient. The patient was provided an  opportunity to ask questions and all were answered. The patient agreed with the plan and demonstrated an understanding of the instructions.  Patient advised to call back or seek an in-person evaluation if the symptoms or condition worsens.  Duration of encounter: 30 minutes.  Total time on encounter, as per AMA guidelines included both the face-to-face and non-face-to-face time personally spent by the physician and/or other qualified health care professional(s) on the day of the encounter (includes time in activities that require the physician or other qualified health care professional and does not include time in activities normally performed by clinical staff). Physician's time may include the following activities when performed: Preparing to see the patient (e.g., pre-charting review of records, searching for previously ordered imaging, lab work, and nerve conduction tests) Review of prior analgesic pharmacotherapies. Reviewing PMP Interpreting ordered tests (e.g., lab work, imaging, nerve conduction tests) Performing post-procedure evaluations, including interpretation of diagnostic procedures Obtaining and/or reviewing separately obtained history Performing a medically appropriate examination and/or evaluation Counseling and educating the patient/family/caregiver Ordering medications, tests, or procedures Referring and communicating with other health care professionals (when not separately reported) Documenting clinical information in the electronic or other health record Independently interpreting results (not separately reported) and communicating results to the patient/ family/caregiver Care coordination (not separately reported)  Note by: Oswaldo Done, MD Date: 05/06/2023; Time: 9:55 AM

## 2023-05-06 ENCOUNTER — Ambulatory Visit: Payer: 59 | Attending: Pain Medicine | Admitting: Pain Medicine

## 2023-05-06 ENCOUNTER — Encounter: Payer: Self-pay | Admitting: Pain Medicine

## 2023-05-06 VITALS — BP 143/94 | HR 110 | Temp 99.3°F | Resp 16 | Ht 70.0 in | Wt 185.0 lb

## 2023-05-06 DIAGNOSIS — M51372 Other intervertebral disc degeneration, lumbosacral region with discogenic back pain and lower extremity pain: Secondary | ICD-10-CM | POA: Insufficient documentation

## 2023-05-06 DIAGNOSIS — R200 Anesthesia of skin: Secondary | ICD-10-CM | POA: Diagnosis present

## 2023-05-06 DIAGNOSIS — Z09 Encounter for follow-up examination after completed treatment for conditions other than malignant neoplasm: Secondary | ICD-10-CM | POA: Insufficient documentation

## 2023-05-06 DIAGNOSIS — M960 Pseudarthrosis after fusion or arthrodesis: Secondary | ICD-10-CM | POA: Insufficient documentation

## 2023-05-06 DIAGNOSIS — M5417 Radiculopathy, lumbosacral region: Secondary | ICD-10-CM | POA: Diagnosis present

## 2023-05-06 DIAGNOSIS — M79605 Pain in left leg: Secondary | ICD-10-CM | POA: Insufficient documentation

## 2023-05-06 DIAGNOSIS — G8929 Other chronic pain: Secondary | ICD-10-CM | POA: Diagnosis present

## 2023-05-06 DIAGNOSIS — G96198 Other disorders of meninges, not elsewhere classified: Secondary | ICD-10-CM | POA: Insufficient documentation

## 2023-05-06 DIAGNOSIS — M47816 Spondylosis without myelopathy or radiculopathy, lumbar region: Secondary | ICD-10-CM | POA: Insufficient documentation

## 2023-05-06 DIAGNOSIS — M5442 Lumbago with sciatica, left side: Secondary | ICD-10-CM | POA: Diagnosis present

## 2023-05-06 DIAGNOSIS — M545 Low back pain, unspecified: Secondary | ICD-10-CM | POA: Diagnosis present

## 2023-05-06 DIAGNOSIS — R52 Pain, unspecified: Secondary | ICD-10-CM | POA: Diagnosis present

## 2023-05-06 DIAGNOSIS — M79604 Pain in right leg: Secondary | ICD-10-CM | POA: Diagnosis present

## 2023-05-06 DIAGNOSIS — M5441 Lumbago with sciatica, right side: Secondary | ICD-10-CM | POA: Diagnosis present

## 2023-05-06 DIAGNOSIS — M961 Postlaminectomy syndrome, not elsewhere classified: Secondary | ICD-10-CM | POA: Insufficient documentation

## 2023-05-06 NOTE — Progress Notes (Signed)
Safety precautions to be maintained throughout the outpatient stay will include: orient to surroundings, keep bed in low position, maintain call bell within reach at all times, provide assistance with transfer out of bed and ambulation.  

## 2023-05-06 NOTE — Patient Instructions (Addendum)
Facet Joint Block The facet joints connect the bones of the spine (vertebrae). They let you bend, twist, and make other movements with your spine. They also keep you from bending too far, twisting too far, and making other extreme movements. A facet joint block is a procedure where a numbing medicine (local anesthesia) is injected into a facet joint. Many times, a medicine for inflammation (steroid) is also injected. A facet joint block may be done: To diagnose neck or back pain. If the pain gets better after a facet joint block, the pain is likely coming from the facet joint. If the pain does not get better, the pain is likely not coming from the facet joint. To treat neck or back pain caused by an inflamed facet joint. To help you with physical therapy or other rehab (rehabilitation) exercises. Tell a health care provider about: Any allergies you have. All medicines you are taking, including vitamins, herbs, eye drops, creams, and over-the-counter medicines. Any problems you or family members have had with anesthesia. Any bleeding problems you have. Any surgeries you have had. Any medical conditions you have or have had. Whether you are pregnant or may be pregnant. What are the risks? Your health care provider will talk with you about risks. These may include: Infection. Allergic reactions to medicines or dyes. Bleeding. Injury to a nerve near where the needle was put in (injection site). Pain at the injection site. Short-term weakness or numbness in areas near the nerves at the injection site. What happens before the procedure? When to stop eating and drinking Follow instructions from your health care provider about what you may eat and drink. Medicines Ask your health care provider about: Changing or stopping your regular medicines. These include any diabetes medicines or blood thinners you take. Taking medicines such as aspirin and ibuprofen. These medicines can thin your blood. Do  not take these medicines unless your health care provider tells you to. Taking over-the-counter medicines, vitamins, herbs, and supplements. General instructions If you will be going home right after the procedure, plan to have a responsible adult: Take you home from the hospital or clinic. You will not be allowed to drive. Care for you for the time you are told. Ask your health care provider: How your injection site will be marked. What steps will be taken to help prevent infection. These may include washing skin with a soap that kills germs. What happens during the procedure?  An IV will be inserted into one of your veins. You will lie on your stomach on an X-ray table. You may be asked to lie in a different position if you will be getting an injection in your neck. Your injection site will be cleaned with a soap that kills germs and then covered with a germ-free (sterile) drape. A local anesthesia will be put in at the injection site. A type of X-ray machine (fluoroscopy) or CT scan will be used to help find your facet joint. A contrast dye may also be injected into your joint to help show if the needle is at the joint. When your provider knows the needle is at your joint, they will inject anesthesia and anti-inflammatory medicine as needed. The needle will be removed. Pressure will be applied to keep your injection site from bleeding. A bandage (dressing) will be placed over each injection site. The procedure may vary among health care providers and hospitals. What happens after the procedure? Your blood pressure, heart rate, breathing rate, and blood oxygen level  will be monitored until you leave the hospital or clinic. This information is not intended to replace advice given to you by your health care provider. Make sure you discuss any questions you have with your health care provider. Document Revised: 01/02/2022 Document Reviewed: 01/02/2022 Elsevier Patient Education  2024  [employer]  ______________________________________________________________________    Procedure instructions  Stop blood-thinners  Do not eat or drink fluids (other than water) for 6 hours before your procedure  No water for 2 hours before your procedure  Take your blood pressure medicine with a sip of water  Arrive 30 minutes before your appointment  If sedation is planned, bring suitable driver. Pennie Banter, Benedetto Goad, & public transportation are NOT APPROVED)  Carefully read the "Preparing for your procedure" detailed instructions  If you have questions call us at [phone]  ______________________________________________________________________      ______________________________________________________________________    Preparing for your procedure  Appointments: If you think you may not be able to keep your appointment, call 24-48 hours in advance to cancel. We need time to make it available to others.  During your procedure appointment there will be: No Prescription Refills. No disability issues to discussed. No medication changes or discussions.  Instructions: Food intake: Avoid eating anything solid for at least 8 hours prior to your procedure. Clear liquid intake: You may take clear liquids such as water up to 2 hours prior to your procedure. (No carbonated drinks. No soda.) Transportation: Unless otherwise stated by your physician, bring a driver. (Driver cannot be a Market researcher, Pharmacist, community, or any other form of public transportation.) Morning Medicines: Except for blood thinners, take all of your other morning medications with a sip of water. Make sure to take your heart and blood pressure medicines. If your blood pressure's lower number is above 100, the case will be rescheduled. Blood thinners: Make sure to stop your blood thinners as instructed.  If you take a blood thinner, but were not instructed to stop it, call our office [phone] and ask to talk to a nurse. Not  stopping a blood thinner prior to certain procedures could lead to serious complications. Diabetics on insulin: Notify the staff so that you can be scheduled 1st case in the morning. If your diabetes requires high dose insulin, take only  of your normal insulin dose the morning of the procedure and notify the staff that you have done so. Preventing infections: Shower with an antibacterial soap the morning of your procedure.  Build-up your immune system: Take 1000 mg of Vitamin C with every meal (3 times a day) the day prior to your procedure. Antibiotics: Inform the nursing staff if you are taking any antibiotics or if you have any conditions that may require antibiotics prior to procedures. (Example: recent joint implants)   Pregnancy: If you are pregnant make sure to notify the nursing staff. Not doing so may result in injury to the fetus, including death.  Sickness: If you have a cold, fever, or any active infections, call and cancel or reschedule your procedure. Receiving steroids while having an infection may result in complications. Arrival: You must be in the facility at least 30 minutes prior to your scheduled procedure. Tardiness: Your scheduled time is also the cutoff time. If you do not arrive at least 15 minutes prior to your procedure, you will be rescheduled.  Children: Do not bring any children with you. Make arrangements to keep them home. Dress appropriately: There is always a possibility that your clothing may get  soiled. Avoid long dresses. Valuables: Do not bring any jewelry or valuables.  Reasons to call and reschedule or cancel your procedure: (Following these recommendations will minimize the risk of a serious complication.) Surgeries: Avoid having procedures within 2 weeks of any surgery. (Avoid for 2 weeks before or after any surgery). Flu Shots: Avoid having procedures within 2 weeks of a flu shots or . (Avoid for 2 weeks before or after immunizations). Barium: Avoid having  a procedure within 7-10 days after having had a radiological study involving the use of radiological contrast. (Myelograms, Barium swallow or enema study). Heart attacks: Avoid any elective procedures or surgeries for the initial 6 months after a "Myocardial Infarction" (Heart Attack). Blood thinners: It is imperative that you stop these medications before procedures. Let us know if you if you take any blood thinner.  Infection: Avoid procedures during or within two weeks of an infection (including chest colds or gastrointestinal problems). Symptoms associated with infections include: Localized redness, fever, chills, night sweats or profuse sweating, burning sensation when voiding, cough, congestion, stuffiness, runny nose, sore throat, diarrhea, nausea, vomiting, cold or Flu symptoms, recent or current infections. It is specially important if the infection is over the area that we intend to treat. Heart and lung problems: Symptoms that may suggest an active cardiopulmonary problem include: cough, chest pain, breathing difficulties or shortness of breath, dizziness, ankle swelling, uncontrolled high or unusually low blood pressure, and/or palpitations. If you are experiencing any of these symptoms, cancel your procedure and contact your primary care physician for an evaluation.  Remember:  Regular Business hours are:  Monday to Thursday 8:00 AM to 4:00 PM  Provider's Schedule: Delano Metz, MD:  Procedure days: Tuesday and Thursday 7:30 AM to 4:00 PM  Edward Jolly, MD:  Procedure days: Monday and Wednesday 7:30 AM to 4:00 PM Last  Updated: 02/23/2023 ______________________________________________________________________      ______________________________________________________________________    General Risks and Possible Complications  Patient Responsibilities: It is important that you read this as it is part of your informed consent. It is our duty to inform you of the risks and  possible complications associated with treatments offered to you. It is your responsibility as a patient to read this and to ask questions about anything that is not clear or that you believe was not covered in this document.  Patient's Rights: You have the right to refuse treatment. You also have the right to change your mind, even after initially having agreed to have the treatment done. However, under this last option, if you wait until the last second to change your mind, you may be charged for the materials used up to that point.  Introduction: Medicine is not an Visual merchandiser. Everything in Medicine, including the lack of treatment(s), carries the potential for danger, harm, or loss (which is by definition: Risk). In Medicine, a complication is a secondary problem, condition, or disease that can aggravate an already existing one. All treatments carry the risk of possible complications. The fact that a side effects or complications occurs, does not imply that the treatment was conducted incorrectly. It must be clearly understood that these can happen even when everything is done following the highest safety standards.  No treatment: You can choose not to proceed with the proposed treatment alternative. The "PRO(s)" would include: avoiding the risk of complications associated with the therapy. The "CON(s)" would include: not getting any of the treatment benefits. These benefits fall under one of three categories: diagnostic; therapeutic; and/or  palliative. Diagnostic benefits include: getting information which can ultimately lead to improvement of the disease or symptom(s). Therapeutic benefits are those associated with the successful treatment of the disease. Finally, palliative benefits are those related to the decrease of the primary symptoms, without necessarily curing the condition (example: decreasing the pain from a flare-up of a chronic condition, such as incurable terminal cancer).  General  Risks and Complications: These are associated to most interventional treatments. They can occur alone, or in combination. They fall under one of the following six (6) categories: no benefit or worsening of symptoms; bleeding; infection; nerve damage; allergic reactions; and/or death. No benefits or worsening of symptoms: In Medicine there are no guarantees, only probabilities. No healthcare provider can ever guarantee that a medical treatment will work, they can only state the probability that it may. Furthermore, there is always the possibility that the condition may worsen, either directly, or indirectly, as a consequence of the treatment. Bleeding: This is more common if the patient is taking a blood thinner, either prescription or over the counter (example: Goody Powders, Fish oil, Aspirin, Garlic, etc.), or if suffering a condition associated with impaired coagulation (example: Hemophilia, cirrhosis of the liver, low platelet counts, etc.). However, even if you do not have one on these, it can still happen. If you have any of these conditions, or take one of these drugs, make sure to notify your treating physician. Infection: This is more common in patients with a compromised immune system, either due to disease (example: diabetes, cancer, human immunodeficiency virus [HIV], etc.), or due to medications or treatments (example: therapies used to treat cancer and rheumatological diseases). However, even if you do not have one on these, it can still happen. If you have any of these conditions, or take one of these drugs, make sure to notify your treating physician. Nerve Damage: This is more common when the treatment is an invasive one, but it can also happen with the use of medications, such as those used in the treatment of cancer. The damage can occur to small secondary nerves, or to large primary ones, such as those in the spinal cord and brain. This damage may be temporary or permanent and it may lead to  impairments that can range from temporary numbness to permanent paralysis and/or brain death. Allergic Reactions: Any time a substance or material comes in contact with our body, there is the possibility of an allergic reaction. These can range from a mild skin rash (contact dermatitis) to a severe systemic reaction (anaphylactic reaction), which can result in death. Death: In general, any medical intervention can result in death, most of the time due to an unforeseen complication. ______________________________________________________________________

## 2023-06-01 ENCOUNTER — Telehealth: Payer: Self-pay | Admitting: Pain Medicine

## 2023-06-01 NOTE — Telephone Encounter (Signed)
PT called stated that the last two times he has to pay full price for his Oxycodone medication because PA hadn't been send in

## 2023-06-17 ENCOUNTER — Ambulatory Visit: Payer: 59 | Attending: Pain Medicine | Admitting: Pain Medicine

## 2023-06-17 ENCOUNTER — Encounter: Payer: Self-pay | Admitting: Pain Medicine

## 2023-06-17 ENCOUNTER — Ambulatory Visit
Admission: RE | Admit: 2023-06-17 | Discharge: 2023-06-17 | Disposition: A | Discharge status: Home / Self Care | Payer: 59 | Source: Ambulatory Visit | Attending: Pain Medicine | Admitting: Pain Medicine

## 2023-06-17 VITALS — BP 120/68 | HR 102 | Temp 97.7°F | Resp 15 | Ht 69.0 in | Wt 181.0 lb

## 2023-06-17 DIAGNOSIS — M47817 Spondylosis without myelopathy or radiculopathy, lumbosacral region: Secondary | ICD-10-CM | POA: Insufficient documentation

## 2023-06-17 DIAGNOSIS — Z79899 Other long term (current) drug therapy: Secondary | ICD-10-CM

## 2023-06-17 DIAGNOSIS — M545 Low back pain, unspecified: Secondary | ICD-10-CM

## 2023-06-17 DIAGNOSIS — R200 Anesthesia of skin: Secondary | ICD-10-CM

## 2023-06-17 DIAGNOSIS — M960 Pseudarthrosis after fusion or arthrodesis: Secondary | ICD-10-CM | POA: Diagnosis not present

## 2023-06-17 DIAGNOSIS — R52 Pain, unspecified: Secondary | ICD-10-CM

## 2023-06-17 DIAGNOSIS — M961 Postlaminectomy syndrome, not elsewhere classified: Secondary | ICD-10-CM | POA: Insufficient documentation

## 2023-06-17 DIAGNOSIS — M5137 Other intervertebral disc degeneration, lumbosacral region with discogenic back pain only: Secondary | ICD-10-CM | POA: Insufficient documentation

## 2023-06-17 DIAGNOSIS — G8929 Other chronic pain: Secondary | ICD-10-CM | POA: Insufficient documentation

## 2023-06-17 DIAGNOSIS — M5459 Other low back pain: Secondary | ICD-10-CM | POA: Insufficient documentation

## 2023-06-17 DIAGNOSIS — M5417 Radiculopathy, lumbosacral region: Secondary | ICD-10-CM

## 2023-06-17 DIAGNOSIS — Z79891 Long term (current) use of opiate analgesic: Secondary | ICD-10-CM

## 2023-06-17 DIAGNOSIS — G894 Chronic pain syndrome: Secondary | ICD-10-CM

## 2023-06-17 DIAGNOSIS — M47816 Spondylosis without myelopathy or radiculopathy, lumbar region: Secondary | ICD-10-CM | POA: Insufficient documentation

## 2023-06-17 DIAGNOSIS — S32009K Unspecified fracture of unspecified lumbar vertebra, subsequent encounter for fracture with nonunion: Secondary | ICD-10-CM

## 2023-06-17 DIAGNOSIS — G96198 Other disorders of meninges, not elsewhere classified: Secondary | ICD-10-CM

## 2023-06-17 MED ORDER — OXYCODONE HCL 10 MG PO TABS: 10.0000 mg | ORAL_TABLET | Freq: Four times a day (QID) | ORAL | 0 refills | Status: DC | PRN

## 2023-06-17 MED ORDER — ROPIVACAINE HCL 2 MG/ML IJ SOLN
18.0000 mL | Freq: Once | INTRAMUSCULAR | Status: AC
  Administered 2023-06-17: 18 mL via PERINEURAL

## 2023-06-17 MED ORDER — MIDAZOLAM HCL 5 MG/5ML IJ SOLN
INTRAMUSCULAR | Status: AC
  Filled 2023-06-17: qty 5

## 2023-06-17 MED ORDER — ROPIVACAINE HCL 2 MG/ML IJ SOLN
INTRAMUSCULAR | Status: AC
  Filled 2023-06-17: qty 20

## 2023-06-17 MED ORDER — LACTATED RINGERS IV SOLN
Freq: Once | INTRAVENOUS | Status: AC
Start: 2023-06-17 — End: 2023-06-17

## 2023-06-17 MED ORDER — FENTANYL CITRATE (PF) 100 MCG/2ML IJ SOLN
INTRAMUSCULAR | Status: AC
  Filled 2023-06-17: qty 2

## 2023-06-17 MED ORDER — LIDOCAINE HCL 2 % IJ SOLN
INTRAMUSCULAR | Status: AC
  Filled 2023-06-17: qty 20

## 2023-06-17 MED ORDER — TRIAMCINOLONE ACETONIDE 40 MG/ML IJ SUSP
INTRAMUSCULAR | Status: AC
  Filled 2023-06-17: qty 2

## 2023-06-17 MED ORDER — TRIAMCINOLONE ACETONIDE 40 MG/ML IJ SUSP
80.0000 mg | Freq: Once | INTRAMUSCULAR | Status: AC
Start: 2023-06-17 — End: 2023-06-17
  Administered 2023-06-17: 80 mg

## 2023-06-17 MED ORDER — OXYCODONE HCL 10 MG PO TABS
10.0000 mg | ORAL_TABLET | Freq: Four times a day (QID) | ORAL | 0 refills | Status: DC | PRN
Start: 2023-09-15 — End: 2023-10-05

## 2023-06-17 MED ORDER — MIDAZOLAM HCL 5 MG/5ML IJ SOLN
0.5000 mg | Freq: Once | INTRAMUSCULAR | Status: AC
  Administered 2023-06-17: 3 mg via INTRAVENOUS

## 2023-06-17 MED ORDER — LIDOCAINE HCL 2 % IJ SOLN
20.0000 mL | Freq: Once | INTRAMUSCULAR | Status: AC
Start: 2023-06-17 — End: 2023-06-17
  Administered 2023-06-17: 400 mg

## 2023-06-17 MED ORDER — PENTAFLUOROPROP-TETRAFLUOROETH EX AERO
INHALATION_SPRAY | Freq: Once | CUTANEOUS | Status: AC
  Administered 2023-06-17: 30 via TOPICAL

## 2023-06-17 MED ORDER — FENTANYL CITRATE (PF) 100 MCG/2ML IJ SOLN
25.0000 ug | INTRAMUSCULAR | Status: DC | PRN
  Administered 2023-06-17: 50 ug via INTRAVENOUS

## 2023-06-17 NOTE — Patient Instructions (Signed)

## 2023-06-17 NOTE — Progress Notes (Signed)
PROVIDER NOTE: Interpretation of information contained herein should be left to medically-trained personnel. Specific patient instructions are provided elsewhere under "Patient Instructions" section of medical record. This document was created in part using STT-dictation technology, any transcriptional errors that may result from this process are unintentional.  Patient: Derek [name] Type: Established DOB: Jun 29, 1981 MRN: [id number] PCP: Wilford Corner, PA-C  Service: Procedure DOS: 06/17/2023 Setting: Ambulatory Location: Ambulatory outpatient facility Delivery: Face-to-face Provider: Oswaldo Done, MD Specialty: Interventional Pain Management Specialty designation: 09 Location: Outpatient facility Ref. Prov.: Delano Metz, MD       Interventional Therapy   Type: Lumbar Facet, Medial Branch Block(s) (w/ fluoroscopic mapping)  R2L3   Laterality: Bilateral  Level (left): L2, L3, L4, L5, and S1 Medial Branch Level(s). Injecting these levels blocks the L3-4, L4-5, and L5-S1 lumbar facet joints. Level (right): L3, L4, L5, and S1 Medial Branch Level(s). Injecting these levels blocks the L4-5 and L5-S1 lumbar facet joints. Imaging: Fluoroscopic guidance Spinal ([id number]) Anesthesia: Local anesthesia (1-2% Lidocaine) Anxiolysis: IV Versed 3.0 mg Sedation: Moderate Sedation Fentanyl 1 mL (50 mcg) DOS: 06/17/2023 Performed by: Oswaldo Done, MD  Primary Purpose: Diagnostic/Therapeutic Indications: Low back pain severe enough to impact quality of life or function. 1. Chronic low back pain (Bilateral) w/o sciatica   2. Lumbar facet joint pain   3. Lumbar facet syndrome (Bilateral) (L>R)   4. Failed back surgical syndrome (x3)   5. Lumbar pseudoarthrosis (L5-S1)   6. Spondylosis without myelopathy or radiculopathy, lumbosacral region   7. Degeneration of intervertebral disc of lumbosacral region with discogenic back pain    NAS-11 Pain score:   Pre-procedure: 5  /10   Post-procedure: 0-No pain/10     Position / Prep / Materials:  Position: Prone  Prep solution: ChloraPrep (2% chlorhexidine gluconate and 70% isopropyl alcohol) Area Prepped: Posterolateral Lumbosacral Spine (Wide prep: From the lower border of the scapula down to the end of the tailbone and from flank to flank.)  Materials:  Tray: Block Needle(s):  Type: Spinal  Gauge (G): 22  Length: 3.5-in Qty: 4      H&P (Pre-op Assessment):  Mr. Lemons is a 42 y.o. (year old), male patient, seen today for interventional treatment. He  has a past surgical history that includes Back surgery; HAND REIMPLANTED; and Lumbar fusion (11/14). Mr. Merlos has a current medication list which includes the following prescription(s): calcium carb-cholecalciferol, celecoxib, cyclobenzaprine, diclofenac sodium, esomeprazole, [START ON 07/17/2023] oxycodone hcl, [START ON 08/16/2023] oxycodone hcl, [START ON 09/15/2023] oxycodone hcl, [START ON 07/10/2023] oxycodone hcl, and tizanidine, and the following Facility-Administered Medications: fentanyl and lactated ringers. His primarily concern today is the Back Pain (lower)  Initial Vital Signs:  Pulse/HCG Rate: (!) 102ECG Heart Rate: 97 (NSR) Temp: 97.7 F (36.5 C) Resp: 16 BP: 117/86 SpO2: 98 %  BMI: Estimated body mass index is 26.73 kg/m as calculated from the following:   Height as of this encounter: 5\' 9"  (1.753 m).   Weight as of this encounter: 181 lb (82.1 kg).  Risk Assessment: Allergies: Reviewed. He is allergic to hydrocodone, amoxicillin, and vicodin [hydrocodone-acetaminophen].  Allergy Precautions: None required Coagulopathies: Reviewed. None identified.  Blood-thinner therapy: None at this time Active Infection(s): Reviewed. None identified. Mr. Bockus is afebrile  Site Confirmation: Mr. Brandenberger was asked to confirm the procedure and laterality before marking the site Procedure checklist: Completed Consent: Before the procedure and under the  influence of no sedative(s), amnesic(s), or anxiolytics, the patient was informed of the treatment  options, risks and possible complications. To fulfill our ethical and legal obligations, as recommended by the American Medical Association's Code of Ethics, I have informed the patient of my clinical impression; the nature and purpose of the treatment or procedure; the risks, benefits, and possible complications of the intervention; the alternatives, including doing nothing; the risk(s) and benefit(s) of the alternative treatment(s) or procedure(s); and the risk(s) and benefit(s) of doing nothing. The patient was provided information about the general risks and possible complications associated with the procedure. These may include, but are not limited to: failure to achieve desired goals, infection, bleeding, organ or nerve damage, allergic reactions, paralysis, and death. In addition, the patient was informed of those risks and complications associated to Spine-related procedures, such as failure to decrease pain; infection (i.e.: Meningitis, epidural or intraspinal abscess); bleeding (i.e.: epidural hematoma, subarachnoid hemorrhage, or any other type of intraspinal or peri-dural bleeding); organ or nerve damage (i.e.: Any type of peripheral nerve, nerve root, or spinal cord injury) with subsequent damage to sensory, motor, and/or autonomic systems, resulting in permanent pain, numbness, and/or weakness of one or several areas of the body; allergic reactions; (i.e.: anaphylactic reaction); and/or death. Furthermore, the patient was informed of those risks and complications associated with the medications. These include, but are not limited to: allergic reactions (i.e.: anaphylactic or anaphylactoid reaction(s)); adrenal axis suppression; blood sugar elevation that in diabetics may result in ketoacidosis or comma; water retention that in patients with history of congestive heart failure may result in shortness of  breath, pulmonary edema, and decompensation with resultant heart failure; weight gain; swelling or edema; medication-induced neural toxicity; particulate matter embolism and blood vessel occlusion with resultant organ, and/or nervous system infarction; and/or aseptic necrosis of one or more joints. Finally, the patient was informed that Medicine is not an exact science; therefore, there is also the possibility of unforeseen or unpredictable risks and/or possible complications that may result in a catastrophic outcome. The patient indicated having understood very clearly. We have given the patient no guarantees and we have made no promises. Enough time was given to the patient to ask questions, all of which were answered to the patient's satisfaction. Mr. Reason has indicated that he wanted to continue with the procedure. Attestation: I, the ordering provider, attest that I have discussed with the patient the benefits, risks, side-effects, alternatives, likelihood of achieving goals, and potential problems during recovery for the procedure that I have provided informed consent. Date  Time: 06/17/2023  8:14 AM   Pre-Procedure Preparation:  Monitoring: As per clinic protocol. Respiration, ETCO2, SpO2, BP, heart rate and rhythm monitor placed and checked for adequate function Safety Precautions: Patient was assessed for positional comfort and pressure points before starting the procedure. Time-out: I initiated and conducted the "Time-out" before starting the procedure, as per protocol. The patient was asked to participate by confirming the accuracy of the "Time Out" information. Verification of the correct person, site, and procedure were performed and confirmed by me, the nursing staff, and the patient. "Time-out" conducted as per Joint Commission's Universal Protocol (UP.01.01.01). Time: 0857 Start Time: 0857 hrs.  Description of Procedure:          Laterality: (see above) Targeted Levels: (see  above)  Safety Precautions: Aspiration looking for blood return was conducted prior to all injections. At no point did we inject any substances, as a needle was being advanced. Before injecting, the patient was told to immediately notify me if he was experiencing any new onset of "ringing  in the ears, or metallic taste in the mouth". No attempts were made at seeking any paresthesias. Safe injection practices and needle disposal techniques used. Medications properly checked for expiration dates. SDV (single dose vial) medications used. After the completion of the procedure, all disposable equipment used was discarded in the proper designated medical waste containers. Local Anesthesia: Protocol guidelines were followed. The patient was positioned over the fluoroscopy table. The area was prepped in the usual manner. The time-out was completed. The target area was identified using fluoroscopy. A 12-in long, straight, sterile hemostat was used with fluoroscopic guidance to locate the targets for each level blocked. Once located, the skin was marked with an approved surgical skin marker. Once all sites were marked, the skin (epidermis, dermis, and hypodermis), as well as deeper tissues (fat, connective tissue and muscle) were infiltrated with a small amount of a short-acting local anesthetic, loaded on a 10cc syringe with a 25G, 1.5-in  Needle. An appropriate amount of time was allowed for local anesthetics to take effect before proceeding to the next step. Local Anesthetic: Lidocaine 2.0% The unused portion of the local anesthetic was discarded in the proper designated containers. Technical description of process:  L2 Medial Branch Nerve Block (MBB): The target area for the L2 medial branch is at the junction of the postero-lateral aspect of the superior articular process and the superior, posterior, and medial edge of the transverse process of L3. Under fluoroscopic guidance, a Quincke needle was inserted until  contact was made with os over the superior postero-lateral aspect of the pedicular shadow (target area). After negative aspiration for blood, 0.5 mL of the nerve block solution was injected without difficulty or complication. The needle was removed intact. L3 Medial Branch Nerve Block (MBB): The target area for the L3 medial branch is at the junction of the postero-lateral aspect of the superior articular process and the superior, posterior, and medial edge of the transverse process of L4. Under fluoroscopic guidance, a Quincke needle was inserted until contact was made with os over the superior postero-lateral aspect of the pedicular shadow (target area). After negative aspiration for blood, 0.5 mL of the nerve block solution was injected without difficulty or complication. The needle was removed intact. L4 Medial Branch Nerve Block (MBB): The target area for the L4 medial branch is at the junction of the postero-lateral aspect of the superior articular process and the superior, posterior, and medial edge of the transverse process of L5. Under fluoroscopic guidance, a Quincke needle was inserted until contact was made with os over the superior postero-lateral aspect of the pedicular shadow (target area). After negative aspiration for blood, 0.5 mL of the nerve block solution was injected without difficulty or complication. The needle was removed intact. L5 Medial Branch Nerve Block (MBB): The target area for the L5 medial branch is at the junction of the postero-lateral aspect of the superior articular process and the superior, posterior, and medial edge of the sacral ala. Under fluoroscopic guidance, a Quincke needle was inserted until contact was made with os over the superior postero-lateral aspect of the pedicular shadow (target area). After negative aspiration for blood, 0.5 mL of the nerve block solution was injected without difficulty or complication. The needle was removed intact. S1 Medial Branch Nerve  Block (MBB): The target area for the S1 medial branch is at the posterior and inferior 6 o'clock position of the L5-S1 facet joint. Under fluoroscopic guidance, the Quincke needle inserted for the L5 MBB was redirected until contact  was made with os over the inferior and postero aspect of the sacrum, at the 6 o' clock position under the L5-S1 facet joint (Target area). After negative aspiration for blood, 0.5 mL of the nerve block solution was injected without difficulty or complication. The needle was removed intact.  Once the entire procedure was completed, the treated area was cleaned, making sure to leave some of the prepping solution back to take advantage of its long term bactericidal properties.         Illustration of the posterior view of the lumbar spine and the posterior neural structures. Laminae of L2 through S1 are labeled. DPRL5, dorsal primary ramus of L5; DPRS1, dorsal primary ramus of S1; DPR3, dorsal primary ramus of L3; FJ, facet (zygapophyseal) joint L3-L4; I, inferior articular process of L4; LB1, lateral branch of dorsal primary ramus of L1; IAB, inferior articular branches from L3 medial branch (supplies L4-L5 facet joint); IBP, intermediate branch plexus; MB3, medial branch of dorsal primary ramus of L3; NR3, third lumbar nerve root; S, superior articular process of L5; SAB, superior articular branches from L4 (supplies L4-5 facet joint also); TP3, transverse process of L3.   Facet Joint Innervation (* possible contribution)  L1-2 T12, L1 (L2*)  Medial Branch  L2-3 L1, L2 (L3*)         "          "  L3-4 L2, L3 (L4*)         "          "  L4-5 L3, L4 (L5*)         "          "  L5-S1 L4, L5, S1          "          "    Vitals:   06/17/23 0902 06/17/23 0907 06/17/23 0910 06/17/23 0918  BP: 120/84 125/80 117/78   Pulse:      Resp: 17 16 15 16   Temp:      TempSrc:      SpO2: 95% 95% 95% 97%  Weight:      Height:         End Time: 0909 hrs.  Imaging Guidance  (Spinal):          Type of Imaging Technique: Fluoroscopy Guidance (Spinal) Indication(s): Fluoroscopy guidance for needle placement to enhance accuracy in procedures requiring precise needle localization for targeted delivery of medication in or near specific anatomical locations not easily accessible without such real-time imaging assistance. Exposure Time: Please see nurses notes. Contrast: None used. Fluoroscopic Guidance: I was personally present during the use of fluoroscopy. "Tunnel Vision Technique" used to obtain the best possible view of the target area. Parallax error corrected before commencing the procedure. "Direction-depth-direction" technique used to introduce the needle under continuous pulsed fluoroscopy. Once target was reached, antero-posterior, oblique, and lateral fluoroscopic projection used confirm needle placement in all planes. Images permanently stored in EMR. Interpretation: No contrast injected. I personally interpreted the imaging intraoperatively. Adequate needle placement confirmed in multiple planes. Permanent images saved into the patient's record.  Post-operative Assessment:  Post-procedure Vital Signs:  Pulse/HCG Rate: (!) 10293 Temp: 97.7 F (36.5 C) Resp: 16 BP: 117/78 SpO2: 97 %  EBL: None  Complications: No immediate post-treatment complications observed by team, or reported by patient.  Note: The patient tolerated the entire procedure well. A repeat set of vitals were taken after the procedure and the patient was kept under  observation following institutional policy, for this type of procedure. Post-procedural neurological assessment was performed, showing return to baseline, prior to discharge. The patient was provided with post-procedure discharge instructions, including a section on how to identify potential problems. Should any problems arise concerning this procedure, the patient was given instructions to immediately contact us, at any time, without  hesitation. In any case, we plan to contact the patient by telephone for a follow-up status report regarding this interventional procedure.  Comments:  No additional relevant information.  Plan of Care (POC)  Orders:  Orders Placed This Encounter  Procedures   LUMBAR FACET(MEDIAL BRANCH NERVE BLOCK) MBNB    Scheduling Instructions:     Procedure: Lumbar facet block (AKA.: Lumbosacral medial branch nerve block)     Side: Bilateral     Level: L3-4, L4-5, L5-S1, and TBD Facets (L2, L3, L4, L5, S1, and TBD Medial Branch Nerves)     Sedation: Patient's choice.     Timeframe: Today    Where will this procedure be performed?:   [facility]   DG [facility] C-ARM 1-60 MIN NO REPORT    Intraoperative interpretation by procedural physician at [facility].    Standing Status:   Standing    Number of Occurrences:   1    Reason for exam::   Assistance in needle guidance and placement for procedures requiring needle placement in or near specific anatomical locations not easily accessible without such assistance.   Informed Consent Details: Physician/Practitioner Attestation; Transcribe to consent form and obtain patient signature    Nursing Order: Transcribe to consent form and obtain patient signature. Note: Always confirm laterality of pain with Mr. Christon, before procedure.    Physician/Practitioner attestation of informed consent for procedure/surgical case:   I, the physician/practitioner, attest that I have discussed with the patient the benefits, risks, side effects, alternatives, likelihood of achieving goals and potential problems during recovery for the procedure that I have provided informed consent.    Procedure:   Lumbar Facet Block  under fluoroscopic guidance    Physician/Practitioner performing the procedure:   Steffie Waggoner A. Laban Emperor MD    Indication/Reason:   Low Back Pain, with our without leg pain, due to Facet Joint Arthralgia (Joint Pain) Spondylosis (Arthritis of  the Spine), without myelopathy or radiculopathy (Nerve Damage).   Provide equipment / supplies at bedside    Procedure tray: "Block Tray" (Disposable  single use) Skin infiltration needle: Regular 1.5-in, 25-G, (x1) Block Needle type: Spinal Amount/quantity: 4 Size: Regular (3.5-inch) Gauge: 22G    Standing Status:   Standing    Number of Occurrences:   1    Specify:   Block Tray   Chronic Opioid Analgesic:   Oxycodone IR 10 mg, 1 tab PO q 6 hrs (40 mg/day of oxycodone) MME/day: 60 mg/day.   Medications ordered for procedure: Meds ordered this encounter  Medications   lidocaine (XYLOCAINE) 2 % (with pres) injection 400 mg   pentafluoroprop-tetrafluoroeth (GEBAUERS) aerosol   lactated ringers infusion   midazolam (VERSED) 5 MG/5ML injection 0.5-2 mg    Make sure Flumazenil is available in the pyxis when using this medication. If oversedation occurs, administer 0.2 mg IV over 15 sec. If after 45 sec no response, administer 0.2 mg again over 1 min; may repeat at 1 min intervals; not to exceed 4 doses (1 mg)   fentaNYL (SUBLIMAZE) injection 25-50 mcg    Make sure Narcan is available in the pyxis when using this medication. In  the event of respiratory depression (RR< 8/min): Titrate NARCAN (naloxone) in increments of 0.1 to 0.2 mg IV at 2-3 minute intervals, until desired degree of reversal.   ropivacaine (PF) 2 mg/mL (0.2%) (NAROPIN) injection 18 mL   triamcinolone acetonide (KENALOG-40) injection 80 mg   Oxycodone HCl 10 MG TABS    Sig: Take 1 tablet (10 mg total) by mouth every 6 (six) hours as needed. Must last 30 days    Dispense:  120 tablet    Refill:  0    DO NOT: delete (not duplicate); no partial-fill (will deny script to complete), no refill request (F/U required). DISPENSE: 1 day early if closed on fill date. WARN: No CNS-depressants within 8 hrs of med.   Oxycodone HCl 10 MG TABS    Sig: Take 1 tablet (10 mg total) by mouth every 6 (six) hours as needed. Must last 30 days     Dispense:  120 tablet    Refill:  0    DO NOT: delete (not duplicate); no partial-fill (will deny script to complete), no refill request (F/U required). DISPENSE: 1 day early if closed on fill date. WARN: No CNS-depressants within 8 hrs of med.   Oxycodone HCl 10 MG TABS    Sig: Take 1 tablet (10 mg total) by mouth every 6 (six) hours as needed. Must last 30 days    Dispense:  120 tablet    Refill:  0    DO NOT: delete (not duplicate); no partial-fill (will deny script to complete), no refill request (F/U required). DISPENSE: 1 day early if closed on fill date. WARN: No CNS-depressants within 8 hrs of med.   Oxycodone HCl 10 MG TABS    Sig: Take 1 tablet (10 mg total) by mouth every 6 (six) hours as needed for up to 7 days. Must last 7 days    Dispense:  28 tablet    Refill:  0    DO NOT: delete (not duplicate); no partial-fill (will deny script to complete), no refill request (F/U required). DISPENSE: 1 day early if closed on fill date. WARN: No CNS-depressants within 8 hrs of med.   Medications administered: We administered lidocaine, pentafluoroprop-tetrafluoroeth, lactated ringers, midazolam, fentaNYL, ropivacaine (PF) 2 mg/mL (0.2%), and triamcinolone acetonide.  See the medical record for exact dosing, route, and time of administration.  Follow-up plan:   Return in about 2 weeks (around 07/01/2023) for (PPE), (VV).       Interventional Therapies  Risk Factors  Considerations:      Lumbar Hardware: No RFA    Planned  Pending:   Therapeutic left RACZ procedure #9 (04/22/2023)    Under consideration:   Possible bilateral lumbar facet RFA  Diagnostic bilateral SI Blk #1  Possible bilateral SI RFA  Possible spinal cord stimulator trial    Completed:   Therapeutic right L2-3 LESI x1 (08/29/2020)  Diagnostic left lumbar facet MBB x2 (01/15/2022) (100/100/75/75)  Diagnostic right lumbar facet MBB x1 (01/15/2022) (100/100/75/75)  Diagnostic left SI Blk x1 (03/21/2020)   Therapeutic/palliative right lateral epicondyle (elbow) inj. x2 (01/21/2021)  Therapeutic/palliative left RACZ procedure x8 (targeting the left S2, S3 area) (10/01/2022) (100/100/90/90)  Therapeutic left RACZ procedure #8 (10/01/2022) (+ saddle block - NO HYPERTONIC injected) (100/100/90/95)    Therapeutic  Palliative (PRN) options:   Palliative right lateral epicondyle (elbow) inj. #2  Palliative left RACZ procedure #8 (targeting the left S2, S3 area)    Pharmacotherapy  Nonopioids transferred 05/02/2020: Zanaflex, Flexeril, and Mobic  Recent Visits Date Type Provider Dept  05/06/23 Office Visit Delano Metz, MD [facility]  04/22/23 Procedure visit Delano Metz, MD [facility]  04/07/23 Office Visit Delano Metz, MD [facility]  Showing recent visits within past 90 days and meeting all other requirements Today's Visits Date Type Provider Dept  06/17/23 Procedure visit Delano Metz, MD [facility]  Showing today's visits and meeting all other requirements Future Appointments Date Type Provider Dept  07/05/23 Appointment Delano Metz, MD [facility]  07/13/23 Appointment Delano Metz, MD [facility]  Showing future appointments within next 90 days and meeting all other requirements  Disposition: Discharge home  Discharge (Date  Time): 06/17/2023;   hrs.   Primary Care Physician: Wilford Corner, PA-C Location: [facility] Note by: Oswaldo Done, MD (TTS technology used. I apologize for any typographical errors that were not detected and corrected.) Date: 06/17/2023; Time: 9:25 AM  Disclaimer:  Medicine is not an Visual merchandiser. The only guarantee in medicine is that nothing is guaranteed. It is important to note that the decision to proceed with this intervention was based on the information collected from the patient. The Data and  conclusions were drawn from the patient's questionnaire, the interview, and the physical examination. Because the information was provided in large part by the patient, it cannot be guaranteed that it has not been purposely or unconsciously manipulated. Every effort has been made to obtain as much relevant data as possible for this evaluation. It is important to note that the conclusions that lead to this procedure are derived in large part from the available data. Always take into account that the treatment will also be dependent on availability of resources and existing treatment guidelines, considered by other Pain Management Practitioners as being common knowledge and practice, at the time of the intervention. For Medico-Legal purposes, it is also important to point out that variation in procedural techniques and pharmacological choices are the acceptable norm. The indications, contraindications, technique, and results of the above procedure should only be interpreted and judged by a Board-Certified Interventional Pain Specialist with extensive familiarity and expertise in the same exact procedure and technique.

## 2023-06-18 ENCOUNTER — Telehealth: Payer: Self-pay

## 2023-06-18 NOTE — Telephone Encounter (Signed)
Post procedure follow up.  Patient states he is doing well 

## 2023-07-05 ENCOUNTER — Encounter: Payer: BC Managed Care – PPO | Admitting: Pain Medicine

## 2023-07-12 ENCOUNTER — Telehealth: Payer: Self-pay

## 2023-07-12 NOTE — Telephone Encounter (Signed)
 LM for patient to call office for pre virtual appt questions.

## 2023-07-12 NOTE — Progress Notes (Signed)
 Patient: Derek [name]  Service Category: E/M  Provider: Eric DELENA Como, MD  DOB: Aug 03, 1980  DOS: 07/13/2023  Location: Office  MRN: [id number]  Setting: Ambulatory outpatient  Referring Provider: Cyrus Selinda Moose,*  Type: Established Patient  Specialty: Interventional Pain Management  PCP: Cyrus Selinda Moose, PA-C  Location: Remote location  Delivery: TeleHealth     Virtual Encounter - Pain Management PROVIDER NOTE: Information contained herein reflects review and annotations entered in association with encounter. Interpretation of such information and data should be left to medically-trained personnel. Information provided to patient can be located elsewhere in the medical record under Patient Instructions. Document created using STT-dictation technology, any transcriptional errors that may result from process are unintentional.    Contact & Pharmacy Preferred: [phone] Home: [phone] (home) Mobile: [phone] (mobile) E-mail: [email or web address]  [facility] [place], [place] - [address] [place] [address] Phone: [phone] Fax: [phone]   Pre-screening  Mr. Holsinger offered in-person vs virtual encounter. He indicated preferring virtual for this encounter.   Reason COVID-19*  Social distancing based on CDC and AMA recommendations.   I contacted Reyes CHRISTELLA Rung on 07/13/2023 via telephone.      I clearly identified myself as Eric DELENA Como, MD. I verified that I was speaking with the correct person using two identifiers (Name: CORDARRIUS COAD, and date of birth: [birth year]).  Consent I sought verbal advanced consent from Reyes CHRISTELLA Rung for virtual visit interactions. I informed Mr. Huser of possible security and privacy concerns, risks, and limitations associated with providing not-in-person medical evaluation and management services. I also informed Mr. Orndoff of the availability of in-person appointments.  Finally, I informed him that there would be a charge for the virtual visit and that he could be  personally, fully or partially, financially responsible for it. Mr. Guzzi expressed understanding and agreed to proceed.   Historic Elements   Mr. BALTHAZAR DOOLY is a 43 y.o. year old, male patient evaluated today after our last contact on 06/17/2023. Mr. Barrell  has a past medical history of Anxiety, Arthritis, and GERD (gastroesophageal reflux disease). He also  has a past surgical history that includes Back surgery; HAND REIMPLANTED; and Lumbar fusion (11/14). Mr. Dieujuste has a current medication list which includes the following prescription(s): calcium  carb-cholecalciferol, celecoxib, cyclobenzaprine , diclofenac sodium, esomeprazole, [START ON 07/17/2023] oxycodone  hcl, [START ON 08/16/2023] oxycodone  hcl, [START ON 09/15/2023] oxycodone  hcl, oxycodone  hcl, and tizanidine . He  reports that he has been smoking cigarettes. He has a 16 pack-year smoking history. He has quit using smokeless tobacco. He reports current alcohol use. He reports that he does not use drugs. Mr. Minus is allergic to hydrocodone , amoxicillin, and vicodin [hydrocodone -acetaminophen ].  BMI: Estimated body mass index is 26.73 kg/m as calculated from the following:   Height as of 06/17/23: 5' 9 (1.753 m).   Weight as of 06/17/23: 181 lb (82.1 kg). Last encounter: 05/06/2023. Last procedure: 06/17/2023.  HPI  Today, he is being contacted for a post-procedure assessment.  Post-procedure evaluation   Type: Lumbar Facet, Medial Branch Block(s) (w/ fluoroscopic mapping)  R2L3   Laterality: Bilateral  Level (left): L2, L3, L4, L5, and S1 Medial Branch Level(s). Injecting these levels blocks the L3-4, L4-5, and L5-S1 lumbar facet joints. Level (right): L3, L4, L5, and S1 Medial Branch Level(s). Injecting these levels blocks the L4-5 and L5-S1 lumbar facet joints. Imaging: Fluoroscopic guidance Spinal ([id number]) Anesthesia: Local  anesthesia (1-2% Lidocaine ) Anxiolysis:  IV Versed  3.0 mg Sedation: Moderate Sedation Fentanyl  1 mL (50 mcg) DOS: 06/17/2023 Performed by: Eric DELENA Como, MD  Primary Purpose: Diagnostic/Therapeutic Indications: Low back pain severe enough to impact quality of life or function. 1. Chronic low back pain (Bilateral) w/o sciatica   2. Lumbar facet joint pain   3. Lumbar facet syndrome (Bilateral) (L>R)   4. Failed back surgical syndrome (x3)   5. Lumbar pseudoarthrosis (L5-S1)   6. Spondylosis without myelopathy or radiculopathy, lumbosacral region   7. Degeneration of intervertebral disc of lumbosacral region with discogenic back pain    NAS-11 Pain score:   Pre-procedure: 5 /10   Post-procedure: 0-No pain/10     Effectiveness:  Initial hour after procedure: 100 %. Subsequent 4-6 hours post-procedure: 100 %. Analgesia past initial 6 hours: 60 % (ongoing). Ongoing improvement:  Analgesic: The patient indicates having an ongoing 60% improvement of his low back pain. Function: Mr. Simonetti reports improvement in function ROM: Mr. Kunkle reports improvement in ROM  Pharmacotherapy Assessment  Opioid Analgesic: Oxycodone  IR 10 mg, 1 tab PO q 6 hrs (40 mg/day of oxycodone ) MME/day: 60 mg/day.   Monitoring: [place] PMP: PDMP reviewed during this encounter.       Pharmacotherapy: No side-effects or adverse reactions reported. Compliance: No problems identified. Effectiveness: Clinically acceptable. Plan: Refer to POC. UDS:  Summary  Date Value Ref Range Status  01/11/2023 Note  Final    Comment:    ==================================================================== Tox[employer] Select 13 (MW) ==================================================================== Specimen Alert ToxAssure, ToxAssure Flex or Mat drug testing: -Engineer, production- Result certification performed at [facility], [address] [place], [place] [address]. [phone] Lab Director Arnulfo Finder,  PhrmD. ==================================================================== Test                             Result       Flag       Units  Drug Present and Declared for Prescription Verification   Oxycodone                       1017         EXPECTED   ng/mg creat   Oxymorphone                    5560         EXPECTED   ng/mg creat   Noroxycodone                   2481         EXPECTED   ng/mg creat   Noroxymorphone                 1161         EXPECTED   ng/mg creat    Sources of oxycodone  are scheduled prescription medications.    Oxymorphone, noroxycodone, and noroxymorphone are expected    metabolites of oxycodone . Oxymorphone is also available as a    scheduled prescription medication.  ==================================================================== Test                      Result    Flag   Units      Ref Range   Creatinine              161              mg/dL      >=79 ==================================================================== Declared Medications:  The flagging and interpretation on this report are based on the  following declared medications.  Unexpected results may arise from  inaccuracies in the declared medications.   **Note: The testing scope of this panel includes these medications:   Oxycodone    **Note: The testing scope of this panel does not include the  following reported medications:   Calcium   Celecoxib (Celebrex)  Cholecalciferol  Cyclobenzaprine  (Flexeril )  Diclofenac  Esomeprazole (Nexium)  Meloxicam  (Mobic )  Naloxone  (Narcan )  Tizanidine  (Zanaflex ) ==================================================================== For clinical consultation, please call [phone]. ====================================================================    No results found for: CBDTHCRKATHLYNE, D9THCCBX   Laboratory Chemistry Profile   Renal Lab Results  Component Value Date   BUN 11 01/16/2019   CREATININE 1.08 01/16/2019   BCR 10  01/16/2019   GFRAA 99 01/16/2019   GFRNONAA 86 01/16/2019    Hepatic Lab Results  Component Value Date   AST 24 01/16/2019   ALBUMIN 4.6 01/16/2019   ALKPHOS 108 01/16/2019    Electrolytes Lab Results  Component Value Date   NA 143 01/16/2019   K 4.0 01/16/2019   CL 105 01/16/2019   CALCIUM  10.2 01/16/2019   MG 2.0 01/16/2019    Bone Lab Results  Component Value Date   25OHVITD1 39 01/16/2019   25OHVITD2 1.2 01/16/2019   25OHVITD3 38 01/16/2019    Inflammation (CRP: Acute Phase) (ESR: Chronic Phase) Lab Results  Component Value Date   CRP 4 01/16/2019   ESRSEDRATE 29 (H) 01/16/2019         Note: Above Lab results reviewed.  Imaging  DG PAIN CLINIC C-ARM 1-60 MIN NO REPORT Fluoro was used, but no Radiologist interpretation will be provided.  Please refer to NOTES tab for provider progress note.  Assessment  The primary encounter diagnosis was Chronic low back pain (Bilateral) w/o sciatica. Diagnoses of Lumbar facet joint pain, Lumbar facet syndrome (Bilateral) (L>R), Failed back surgical syndrome (x3), Lumbar pseudoarthrosis (L5-S1), Spondylosis without myelopathy or radiculopathy, lumbosacral region, Degeneration of intervertebral disc of lumbosacral region with discogenic back pain, Chronic low back pain (1ry area of Pain) (Bilateral) (L>R) w/ sciatica (Bilateral), Chronic lower extremity pain (2ry area of Pain) (Bilateral) (L>R), Radicular pain of lumbosacral region, Burning pain in thigh (Right), and Postop check were also pertinent to this visit.  Plan of Care  Problem-specific:  No problem-specific Assessment & Plan notes found for this encounter.  Mr. DARELL SAPUTO has a current medication list which includes the following long-term medication(s): calcium  carb-cholecalciferol, esomeprazole, [START ON 07/17/2023] oxycodone  hcl, [START ON 08/16/2023] oxycodone  hcl, [START ON 09/15/2023] oxycodone  hcl, oxycodone  hcl, and tizanidine .  Pharmacotherapy (Medications  Ordered): No orders of the defined types were placed in this encounter.  Orders:  Orders Placed This Encounter  Procedures   Nursing Instructions:    Please complete this patient's postprocedure evaluation.    Scheduling Instructions:     Please complete this patient's postprocedure evaluation.   Follow-up plan:   No follow-ups on file.      Interventional Therapies  Risk Factors  Considerations:      Lumbar Hardware: No RFA    Planned  Pending:   Therapeutic left RACZ procedure #9 (04/22/2023)    Under consideration:   Possible bilateral lumbar facet RFA  Diagnostic bilateral SI Blk #1  Possible bilateral SI RFA  Possible spinal cord stimulator trial    Completed:   Therapeutic right L2-3 LESI x1 (08/29/2020)  Diagnostic left lumbar facet MBB x2 (01/15/2022) (100/100/75/75)  Diagnostic right lumbar  facet MBB x1 (01/15/2022) (100/100/75/75)  Diagnostic left SI Blk x1 (03/21/2020)  Therapeutic/palliative right lateral epicondyle (elbow) inj. x2 (01/21/2021)  Therapeutic/palliative left RACZ procedure x8 (targeting the left S2, S3 area) (10/01/2022) (100/100/90/90)  Therapeutic left RACZ procedure #8 (10/01/2022) (+ saddle block - NO HYPERTONIC injected) (100/100/90/95)    Therapeutic  Palliative (PRN) options:   Palliative right lateral epicondyle (elbow) inj. #2  Palliative left RACZ procedure #8 (targeting the left S2, S3 area)    Pharmacotherapy  Nonopioids transferred 05/02/2020: Zanaflex , Flexeril , and Mobic        Recent Visits Date Type Provider Dept  06/17/23 Procedure visit Tanya Glisson, MD [facility]  05/06/23 Office Visit Tanya Glisson, MD [facility]  04/22/23 Procedure visit Tanya Glisson, MD [facility]  Showing recent visits within past 90 days and meeting all other requirements Today's Visits Date Type Provider Dept  07/13/23 Office Visit Tanya Glisson, MD [facility]  Showing today's  visits and meeting all other requirements Future Appointments Date Type Provider Dept  10/06/23 Appointment Tanya Glisson, MD [facility]  Showing future appointments within next 90 days and meeting all other requirements  I discussed the assessment and treatment plan with the patient. The patient was provided an opportunity to ask questions and all were answered. The patient agreed with the plan and demonstrated an understanding of the instructions.  Patient advised to call back or seek an in-person evaluation if the symptoms or condition worsens.  Duration of encounter: 12 minutes.  Note by: Glisson DELENA Tanya, MD Date: 07/13/2023; Time: 1:52 PM

## 2023-07-13 ENCOUNTER — Ambulatory Visit: Payer: 59 | Attending: Pain Medicine | Admitting: Pain Medicine

## 2023-07-13 DIAGNOSIS — M47816 Spondylosis without myelopathy or radiculopathy, lumbar region: Secondary | ICD-10-CM

## 2023-07-13 DIAGNOSIS — M5137 Other intervertebral disc degeneration, lumbosacral region with discogenic back pain only: Secondary | ICD-10-CM

## 2023-07-13 DIAGNOSIS — M545 Low back pain, unspecified: Secondary | ICD-10-CM | POA: Diagnosis not present

## 2023-07-13 DIAGNOSIS — M5417 Radiculopathy, lumbosacral region: Secondary | ICD-10-CM

## 2023-07-13 DIAGNOSIS — Z09 Encounter for follow-up examination after completed treatment for conditions other than malignant neoplasm: Secondary | ICD-10-CM

## 2023-07-13 DIAGNOSIS — M47817 Spondylosis without myelopathy or radiculopathy, lumbosacral region: Secondary | ICD-10-CM

## 2023-07-13 DIAGNOSIS — R52 Pain, unspecified: Secondary | ICD-10-CM

## 2023-07-13 DIAGNOSIS — G8929 Other chronic pain: Secondary | ICD-10-CM

## 2023-07-13 DIAGNOSIS — M5459 Other low back pain: Secondary | ICD-10-CM

## 2023-07-13 DIAGNOSIS — S32009K Unspecified fracture of unspecified lumbar vertebra, subsequent encounter for fracture with nonunion: Secondary | ICD-10-CM

## 2023-07-13 DIAGNOSIS — M961 Postlaminectomy syndrome, not elsewhere classified: Secondary | ICD-10-CM

## 2023-10-04 NOTE — Progress Notes (Unsigned)
 PROVIDER NOTE: Information contained herein reflects review and annotations entered in association with encounter. Interpretation of such information and data should be left to medically-trained personnel. Information provided to patient can be located elsewhere in the medical record under "Patient Instructions". Document created using STT-dictation technology, any transcriptional errors that may result from process are unintentional.    Patient: Derek [name]  Service Category: E/M  Provider: Oswaldo Done, MD  DOB: 03-Jul-1981  DOS: 10/06/2023  Referring Provider: Wilford Corner  MRN: [id number]  Specialty: Interventional Pain Management  PCP: Wilford Corner, PA-C  Type: Established Patient  Setting: Ambulatory outpatient    Location: Office  Delivery: Face-to-face     HPI  Mr. KHARTER BREW, a 43 y.o. year old male, is here today because of his No primary diagnosis found.. Mr. Curro primary complain today is No chief complaint on file.  Pertinent problems: Mr. Bussey has Lumbar pseudoarthrosis (L5-S1); Chronic low back pain (1ry area of Pain) (Bilateral) (L>R) w/ sciatica (Bilateral); Chronic lower extremity pain (2ry area of Pain) (Bilateral) (L>R); Chronic pain syndrome; Failed back surgical syndrome (x3); L5-S1 pseudoarthrosis; Chronic musculoskeletal pain; Spasm of back muscles; Sacroiliac joint dysfunction (Bilateral); Chronic sacroiliac joint pain (Bilateral) (L>R); Somatic dysfunction of sacroiliac joints (Bilateral); Chronic hip pain (Bilateral); Epidural fibrosis; Lumbar postlaminectomy syndrome; DDD (degenerative disc disease), lumbosacral; Lumbar facet syndrome (Bilateral) (L>R); Other specified dorsopathies, sacral and sacrococcygeal region; Spondylosis without myelopathy or radiculopathy, lumbosacral region; Neurogenic pain; Osteoarthritic spondylosis of lumbar spine; Chronic low back pain (Left) w/o sciatica; Numbness of anterior thigh (Right); Burning pain in thigh  (Right); Chronic low back pain (Midline) w/o sciatica; Enthesopathy of elbow region (Bilateral); Sprain of lateral collateral ligament of elbow, sequela (Left); Sprain of lateral collateral ligament of elbow, sequela (Right); Radicular pain of lumbosacral region; Lumbosacral radiculopathy; Chronic low back pain (Bilateral) w/o sciatica; and Lumbar facet joint pain on their pertinent problem list. Pain Assessment: Severity of   is reported as a  /10. Location:    / . Onset:  . Quality:  . Timing:  . Modifying factor(s):  Marland Kitchen Vitals:  vitals were not taken for this visit.  BMI: Estimated body mass index is 26.73 kg/m as calculated from the following:   Height as of 06/17/23: 5\' 9"  (1.753 m).   Weight as of 06/17/23: 181 lb (82.1 kg). Last encounter: 07/13/2023. Last procedure: 06/17/2023.  Reason for encounter: medication management. ***  Discussed the use of AI scribe software for clinical note transcription with the patient, who gave verbal consent to proceed.  History of Present Illness           Pharmacotherapy Assessment  Analgesic: Oxycodone IR 10 mg, 1 tab PO q 6 hrs (40 mg/day of oxycodone) MME/day: 60 mg/day.   Monitoring: [place] PMP: PDMP reviewed during this encounter.       Pharmacotherapy: No side-effects or adverse reactions reported. Compliance: No problems identified. Effectiveness: Clinically acceptable.  No notes on file  No results found for: "CBDTHCR" No results found for: "D8THCCBX" No results found for: "D9THCCBX"  UDS:  Summary  Date Value Ref Range Status  01/11/2023 Note  Final    Comment:    ==================================================================== ToxASSU[employer] Select 13 (MW) ==================================================================== Specimen Alert ToxAssure, ToxAssure Flex or Mat drug testing: -Engineer, production- Result certification performed at [facility] [employer], [address] [place], [place] [address]. [phone] Lab  Director Cherylann Ratel, PhrmD. ==================================================================== Test  Result       Flag       Units  Drug Present and Declared for Prescription Verification   Oxycodone                      1017         EXPECTED   ng/mg creat   Oxymorphone                    5560         EXPECTED   ng/mg creat   Noroxycodone                   2481         EXPECTED   ng/mg creat   Noroxymorphone                 1161         EXPECTED   ng/mg creat    Sources of oxycodone are scheduled prescription medications.    Oxymorphone, noroxycodone, and noroxymorphone are expected    metabolites of oxycodone. Oxymorphone is also available as a    scheduled prescription medication.  ==================================================================== Test                      Result    Flag   Units      Ref Range   Creatinine              161              mg/dL      >=16 ==================================================================== Declared Medications:  The flagging and interpretation on this report are based on the  following declared medications.  Unexpected results may arise from  inaccuracies in the declared medications.   **Note: The testing scope of this panel includes these medications:   Oxycodone   **Note: The testing scope of this panel does not include the  following reported medications:   Calcium  Celecoxib (Celebrex)  Cholecalciferol  Cyclobenzaprine (Flexeril)  Diclofenac  Esomeprazole (Nexium)  Meloxicam (Mobic)  Naloxone (Narcan)  Tizanidine (Zanaflex) ==================================================================== For clinical consultation, please call [phone]. ====================================================================       ROS  Constitutional: Denies any fever or chills Gastrointestinal: No reported hemesis, hematochezia, vomiting, or acute GI distress Musculoskeletal: Denies any acute  onset joint swelling, redness, loss of ROM, or weakness Neurological: No reported episodes of acute onset apraxia, aphasia, dysarthria, agnosia, amnesia, paralysis, loss of coordination, or loss of consciousness  Medication Review  Calcium Carb-Cholecalciferol, Diclofenac Sodium, Oxycodone HCl, celecoxib, cyclobenzaprine, esomeprazole, and tiZANidine  History Review  Allergy: Mr. Lefever is allergic to hydrocodone, amoxicillin, and vicodin [hydrocodone-acetaminophen]. Drug: Mr. Schwandt  reports no history of drug use. Alcohol:  reports current alcohol use. Tobacco:  reports that he has been smoking cigarettes. He has a 16 pack-year smoking history. He has quit using smokeless tobacco. Social: Mr. Cerro  reports that he has been smoking cigarettes. He has a 16 pack-year smoking history. He has quit using smokeless tobacco. He reports current alcohol use. He reports that he does not use drugs. Medical:  has a past medical history of Anxiety, Arthritis, and GERD (gastroesophageal reflux disease). Surgical: Mr. Fina  has a past surgical history that includes Back surgery; HAND REIMPLANTED; and Lumbar fusion (11/14). Family: family history is not on file.  Laboratory Chemistry Profile   Renal Lab Results  Component Value Date   BUN 11 01/16/2019   CREATININE  1.08 01/16/2019   BCR 10 01/16/2019   GFRAA 99 01/16/2019   GFRNONAA 86 01/16/2019    Hepatic Lab Results  Component Value Date   AST 24 01/16/2019   ALBUMIN 4.6 01/16/2019   ALKPHOS 108 01/16/2019    Electrolytes Lab Results  Component Value Date   NA 143 01/16/2019   K 4.0 01/16/2019   CL 105 01/16/2019   CALCIUM 10.2 01/16/2019   MG 2.0 01/16/2019    Bone Lab Results  Component Value Date   25OHVITD1 39 01/16/2019   25OHVITD2 1.2 01/16/2019   25OHVITD3 38 01/16/2019    Inflammation (CRP: Acute Phase) (ESR: Chronic Phase) Lab Results  Component Value Date   CRP 4 01/16/2019   ESRSEDRATE 29 (H) 01/16/2019          Note: Above Lab results reviewed.  Recent Imaging Review  DG PAIN CLINIC C-ARM 1-60 MIN NO REPORT Fluoro was used, but no Radiologist interpretation will be provided.  Please refer to "NOTES" tab for provider progress note. Note: Reviewed        Physical Exam  General appearance: Well nourished, well developed, and well hydrated. In no apparent acute distress Mental status: Alert, oriented x 3 (person, place, & time)       Respiratory: No evidence of acute respiratory distress Eyes: PERLA Vitals: There were no vitals taken for this visit. BMI: Estimated body mass index is 26.73 kg/m as calculated from the following:   Height as of 06/17/23: 5\' 9"  (1.753 m).   Weight as of 06/17/23: 181 lb (82.1 kg). Ideal: Patient weight not recorded  Assessment   Diagnosis Status  No diagnosis found. Controlled Controlled Controlled   Updated Problems: No problems updated.  Plan of Care  Problem-specific:  Assessment and Plan            Mr. ANKIT DEGREGORIO has a current medication list which includes the following long-term medication(s): calcium carb-cholecalciferol, esomeprazole, oxycodone hcl, oxycodone hcl, oxycodone hcl, oxycodone hcl, and tizanidine.  Pharmacotherapy (Medications Ordered): No orders of the defined types were placed in this encounter.  Orders:  No orders of the defined types were placed in this encounter.  Follow-up plan:   No follow-ups on file.      Interventional Therapies  Risk Factors  Considerations:      Lumbar Hardware: No RFA    Planned  Pending:   Therapeutic left RACZ procedure #9 (04/22/2023)    Under consideration:   Possible bilateral lumbar facet RFA  Diagnostic bilateral SI Blk #1  Possible bilateral SI RFA  Possible spinal cord stimulator trial    Completed:   Therapeutic right L2-3 LESI x1 (08/29/2020)  Diagnostic left lumbar facet MBB x2 (01/15/2022) (100/100/75/75)  Diagnostic right lumbar facet MBB x1 (01/15/2022)  (100/100/75/75)  Diagnostic left SI Blk x1 (03/21/2020)  Therapeutic/palliative right lateral epicondyle (elbow) inj. x2 (01/21/2021)  Therapeutic/palliative left RACZ procedure x8 (targeting the left S2, S3 area) (10/01/2022) (100/100/90/90)  Therapeutic left RACZ procedure #8 (10/01/2022) (+ saddle block - NO HYPERTONIC injected) (100/100/90/95)    Therapeutic  Palliative (PRN) options:   Palliative right lateral epicondyle (elbow) inj. #2  Palliative left RACZ procedure #8 (targeting the left S2, S3 area)    Pharmacotherapy  Nonopioids transferred 05/02/2020: Zanaflex, Flexeril, and Mobic       Recent Visits Date Type Provider Dept  07/13/23 Office Visit Delano Metz, MD [facility]  Showing recent visits within past 90 days and meeting all other requirements Future Appointments Date  Type Provider Dept  10/06/23 Appointment Delano Metz, MD [facility]  Showing future appointments within next 90 days and meeting all other requirements  I discussed the assessment and treatment plan with the patient. The patient was provided an opportunity to ask questions and all were answered. The patient agreed with the plan and demonstrated an understanding of the instructions.  Patient advised to call back or seek an in-person evaluation if the symptoms or condition worsens.  Duration of encounter: *** minutes.  Total time on encounter, as per AMA guidelines included both the face-to-face and non-face-to-face time personally spent by the physician and/or other qualified health care professional(s) on the day of the encounter (includes time in activities that require the physician or other qualified health care professional and does not include time in activities normally performed by clinical staff). Physician's time may include the following activities when performed: Preparing to see the patient (e.g., pre-charting review of records, searching for previously  ordered imaging, lab work, and nerve conduction tests) Review of prior analgesic pharmacotherapies. Reviewing PMP Interpreting ordered tests (e.g., lab work, imaging, nerve conduction tests) Performing post-procedure evaluations, including interpretation of diagnostic procedures Obtaining and/or reviewing separately obtained history Performing a medically appropriate examination and/or evaluation Counseling and educating the patient/family/caregiver Ordering medications, tests, or procedures Referring and communicating with other health care professionals (when not separately reported) Documenting clinical information in the electronic or other health record Independently interpreting results (not separately reported) and communicating results to the patient/ family/caregiver Care coordination (not separately reported)  Note by: Oswaldo Done, MD Date: 10/06/2023; Time: 5:48 AM

## 2023-10-05 NOTE — Patient Instructions (Incomplete)

## 2023-10-06 ENCOUNTER — Ambulatory Visit: Payer: Self-pay | Attending: Pain Medicine | Admitting: Pain Medicine

## 2023-10-06 ENCOUNTER — Encounter: Payer: Self-pay | Admitting: Pain Medicine

## 2023-10-06 VITALS — BP 122/94 | HR 112 | Temp 97.2°F | Resp 18 | Ht 70.0 in | Wt 185.0 lb

## 2023-10-06 DIAGNOSIS — Z79899 Other long term (current) drug therapy: Secondary | ICD-10-CM | POA: Insufficient documentation

## 2023-10-06 DIAGNOSIS — M5442 Lumbago with sciatica, left side: Secondary | ICD-10-CM | POA: Diagnosis present

## 2023-10-06 DIAGNOSIS — M5441 Lumbago with sciatica, right side: Secondary | ICD-10-CM | POA: Insufficient documentation

## 2023-10-06 DIAGNOSIS — M79604 Pain in right leg: Secondary | ICD-10-CM | POA: Insufficient documentation

## 2023-10-06 DIAGNOSIS — R200 Anesthesia of skin: Secondary | ICD-10-CM | POA: Diagnosis present

## 2023-10-06 DIAGNOSIS — M79605 Pain in left leg: Secondary | ICD-10-CM | POA: Diagnosis present

## 2023-10-06 DIAGNOSIS — G8929 Other chronic pain: Secondary | ICD-10-CM | POA: Insufficient documentation

## 2023-10-06 DIAGNOSIS — M5417 Radiculopathy, lumbosacral region: Secondary | ICD-10-CM | POA: Insufficient documentation

## 2023-10-06 DIAGNOSIS — G894 Chronic pain syndrome: Secondary | ICD-10-CM | POA: Diagnosis not present

## 2023-10-06 DIAGNOSIS — G96198 Other disorders of meninges, not elsewhere classified: Secondary | ICD-10-CM | POA: Insufficient documentation

## 2023-10-06 DIAGNOSIS — M960 Pseudarthrosis after fusion or arthrodesis: Secondary | ICD-10-CM | POA: Diagnosis present

## 2023-10-06 DIAGNOSIS — S32009K Unspecified fracture of unspecified lumbar vertebra, subsequent encounter for fracture with nonunion: Secondary | ICD-10-CM | POA: Diagnosis present

## 2023-10-06 DIAGNOSIS — Z79891 Long term (current) use of opiate analgesic: Secondary | ICD-10-CM | POA: Diagnosis present

## 2023-10-06 DIAGNOSIS — M961 Postlaminectomy syndrome, not elsewhere classified: Secondary | ICD-10-CM | POA: Diagnosis present

## 2023-10-06 DIAGNOSIS — R52 Pain, unspecified: Secondary | ICD-10-CM | POA: Diagnosis present

## 2023-10-06 MED ORDER — NALOXONE HCL 4 MG/0.1ML NA LIQD: 1.0000 | NASAL | 1 refills | Status: AC | PRN

## 2023-10-06 MED ORDER — OXYCODONE HCL 10 MG PO TABS: 10.0000 mg | ORAL_TABLET | Freq: Four times a day (QID) | ORAL | 0 refills | Status: DC | PRN

## 2023-10-06 NOTE — Progress Notes (Signed)
 Nursing Pain Medication Assessment:  Safety precautions to be maintained throughout the outpatient stay will include: orient to surroundings, keep bed in low position, maintain call bell within reach at all times, provide assistance with transfer out of bed and ambulation.  Medication Inspection Compliance: Pill count conducted under aseptic conditions, in front of the patient. Neither the pills nor the bottle was removed from the patient's sight at any time. Once count was completed pills were immediately returned to the patient in their original bottle.  Medication: Oxycodone IR Pill/Patch Count:  28 of 120 pills remain Pill/Patch Appearance: Markings consistent with prescribed medication Bottle Appearance: Standard pharmacy container. Clearly labeled. Filled Date: 03 / 12 / 2025 Last Medication intake:  Today

## 2023-10-13 ENCOUNTER — Telehealth: Payer: Self-pay | Admitting: Pain Medicine

## 2023-10-13 NOTE — Telephone Encounter (Signed)
Thank you Kathy!

## 2023-10-13 NOTE — Telephone Encounter (Signed)
 I rescheduled Derek [name] from 10-21-23 at 8am to 10-19-23 at 12:40

## 2023-10-19 ENCOUNTER — Encounter: Payer: Self-pay | Admitting: Pain Medicine

## 2023-10-19 ENCOUNTER — Ambulatory Visit
Admission: RE | Admit: 2023-10-19 | Discharge: 2023-10-19 | Disposition: A | Discharge status: Home / Self Care | Source: Ambulatory Visit | Attending: Pain Medicine | Admitting: Pain Medicine

## 2023-10-19 ENCOUNTER — Ambulatory Visit: Attending: Pain Medicine | Admitting: Pain Medicine

## 2023-10-19 VITALS — BP 116/61 | HR 99 | Temp 97.4°F | Resp 20 | Ht 70.0 in | Wt 181.0 lb

## 2023-10-19 DIAGNOSIS — M79604 Pain in right leg: Secondary | ICD-10-CM | POA: Diagnosis not present

## 2023-10-19 DIAGNOSIS — M5441 Lumbago with sciatica, right side: Secondary | ICD-10-CM

## 2023-10-19 DIAGNOSIS — M51372 Other intervertebral disc degeneration, lumbosacral region with discogenic back pain and lower extremity pain: Secondary | ICD-10-CM

## 2023-10-19 DIAGNOSIS — G8929 Other chronic pain: Secondary | ICD-10-CM

## 2023-10-19 DIAGNOSIS — M5442 Lumbago with sciatica, left side: Secondary | ICD-10-CM | POA: Diagnosis present

## 2023-10-19 DIAGNOSIS — R200 Anesthesia of skin: Secondary | ICD-10-CM | POA: Diagnosis not present

## 2023-10-19 DIAGNOSIS — G96198 Other disorders of meninges, not elsewhere classified: Secondary | ICD-10-CM

## 2023-10-19 DIAGNOSIS — M5417 Radiculopathy, lumbosacral region: Secondary | ICD-10-CM

## 2023-10-19 DIAGNOSIS — M961 Postlaminectomy syndrome, not elsewhere classified: Secondary | ICD-10-CM | POA: Diagnosis not present

## 2023-10-19 DIAGNOSIS — M79605 Pain in left leg: Secondary | ICD-10-CM | POA: Diagnosis not present

## 2023-10-19 DIAGNOSIS — G9689 Other specified disorders of central nervous system: Secondary | ICD-10-CM | POA: Insufficient documentation

## 2023-10-19 MED ORDER — ROPIVACAINE HCL 2 MG/ML IJ SOLN
INTRAMUSCULAR | Status: AC
  Filled 2023-10-19: qty 20

## 2023-10-19 MED ORDER — FENTANYL CITRATE (PF) 100 MCG/2ML IJ SOLN
INTRAMUSCULAR | Status: AC
  Filled 2023-10-19: qty 2

## 2023-10-19 MED ORDER — FENTANYL CITRATE (PF) 100 MCG/2ML IJ SOLN
25.0000 ug | INTRAMUSCULAR | Status: DC | PRN
  Administered 2023-10-19: 50 ug via INTRAVENOUS

## 2023-10-19 MED ORDER — SODIUM CHLORIDE 4 MEQ/ML IV SOLN
10.0000 mL | Freq: Once | INTRAVENOUS | Status: AC
  Administered 2023-10-19: 10 mL via EPIDURAL
  Filled 2023-10-19 (×2): qty 4.27

## 2023-10-19 MED ORDER — CEFAZOLIN SODIUM 1 G IJ SOLR
INTRAMUSCULAR | Status: AC
  Filled 2023-10-19: qty 10

## 2023-10-19 MED ORDER — LIDOCAINE HCL 2 % IJ SOLN
20.0000 mL | Freq: Once | INTRAMUSCULAR | Status: AC
  Administered 2023-10-19: 400 mg

## 2023-10-19 MED ORDER — CEFAZOLIN SODIUM-DEXTROSE 1-4 GM/50ML-% IV SOLN
1.0000 g | INTRAVENOUS | Status: AC
  Administered 2023-10-19: 1 g via INTRAVENOUS

## 2023-10-19 MED ORDER — MIDAZOLAM HCL 5 MG/5ML IJ SOLN
0.5000 mg | Freq: Once | INTRAMUSCULAR | Status: AC
  Administered 2023-10-19: 2 mg via INTRAVENOUS

## 2023-10-19 MED ORDER — SODIUM CHLORIDE (PF) 0.9 % IJ SOLN
INTRAMUSCULAR | Status: AC
Start: 2023-10-19 — End: ?
  Filled 2023-10-19: qty 20

## 2023-10-19 MED ORDER — TRIAMCINOLONE ACETONIDE 40 MG/ML IJ SUSP
40.0000 mg | Freq: Once | INTRAMUSCULAR | Status: AC
Start: 2023-10-19 — End: 2023-10-19
  Administered 2023-10-19: 40 mg

## 2023-10-19 MED ORDER — IOHEXOL 180 MG/ML  SOLN
10.0000 mL | Freq: Once | INTRAMUSCULAR | Status: AC
  Administered 2023-10-19: 10 mL via EPIDURAL

## 2023-10-19 MED ORDER — LIDOCAINE HCL (PF) 2 % IJ SOLN
INTRAMUSCULAR | Status: AC
  Filled 2023-10-19: qty 10

## 2023-10-19 MED ORDER — IOHEXOL 180 MG/ML  SOLN
INTRAMUSCULAR | Status: AC
  Filled 2023-10-19: qty 10

## 2023-10-19 MED ORDER — MIDAZOLAM HCL 5 MG/5ML IJ SOLN
INTRAMUSCULAR | Status: AC
Start: 2023-10-19 — End: ?
  Filled 2023-10-19: qty 5

## 2023-10-19 MED ORDER — SODIUM CHLORIDE 0.9% FLUSH
4.0000 mL | Freq: Once | INTRAVENOUS | Status: AC
  Administered 2023-10-19: 4 mL

## 2023-10-19 MED ORDER — LIDOCAINE-EPINEPHRINE (PF) 2 %-1:200000 IJ SOLN
10.0000 mL | Freq: Once | INTRAMUSCULAR | Status: AC
Start: 2023-10-19 — End: 2023-10-19
  Administered 2023-10-19: 10 mL

## 2023-10-19 MED ORDER — HYALURONIDASE HUMAN 150 UNIT/ML IJ SOLN
1500.0000 [IU] | Freq: Once | INTRAMUSCULAR | Status: AC
Start: 2023-10-19 — End: 2023-10-19
  Administered 2023-10-19: 1500 [IU] via INTRADERMAL
  Filled 2023-10-19: qty 10

## 2023-10-19 MED ORDER — TRIAMCINOLONE ACETONIDE 40 MG/ML IJ SUSP
INTRAMUSCULAR | Status: AC
Start: 2023-10-19 — End: ?
  Filled 2023-10-19: qty 1

## 2023-10-19 MED ORDER — PENTAFLUOROPROP-TETRAFLUOROETH EX AERO
INHALATION_SPRAY | Freq: Once | CUTANEOUS | Status: AC
  Administered 2023-10-19: 30 via TOPICAL

## 2023-10-19 NOTE — Patient Instructions (Signed)

## 2023-10-19 NOTE — Progress Notes (Signed)
 PROVIDER NOTE: Interpretation of information contained herein should be left to medically-trained personnel. Specific patient instructions are provided elsewhere under "Patient Instructions" section of medical record. This document was created in part using STT-dictation technology, any transcriptional errors that may result from this process are unintentional.  Patient: Derek [name] Type: Established DOB: Nov 10, 1980 MRN: [id number] PCP: Wilford Corner, PA-C  Service: Procedure DOS: 10/19/2023 Setting: Ambulatory Location: Ambulatory outpatient facility Delivery: Face-to-face Provider: Oswaldo Done, MD Specialty: Interventional Pain Management Specialty designation: 09 Location: Outpatient facility Ref. Prov.: Harlon Flor, Jonnie Finner,*       Interventional Therapy   Primary Reason for Visit: Interventional Pain Management Treatment. CC: Back Pain (R>L)    Procedure:          Anesthesia, Analgesia, Anxiolysis:  Type: Therapeutic Percutaneous Epidural Neuroplasty and Lysis of Adhesions (RACZ Procedure)   #10   Region: Caudal Level: Sacrococcygeal   Laterality: Midline aiming at the left  Anesthesia: Local (1-2% Lidocaine)  Anxiolysis: IV  Sedation: Moderate  Guidance: Fluoroscopy Spinal ([id number])   Position: Prone   1. Chronic low back pain (1ry area of Pain) (Bilateral) (L>R) w/ sciatica (Bilateral)   2. Chronic lower extremity pain (2ry area of Pain) (Bilateral) (L>R)   3. Degeneration of intervertebral disc of lumbosacral region with discogenic back pain and lower extremity pain   4. Epidural fibrosis   5. Failed back surgical syndrome (x3)   6. Lumbosacral radiculopathy   7. Numbness of anterior thigh (Right)   8. Radicular pain of lumbosacral region    NAS-11 Pain score:   Pre-procedure: 8 /10   Post-procedure: 5 /10     H&P (Pre-op Assessment):  Mr. Towell is a 43 y.o. (year old), male patient, seen today for interventional treatment. He  has a past  surgical history that includes Back surgery; HAND REIMPLANTED; and Lumbar fusion (11/14). Mr. Noxon has a current medication list which includes the following prescription(s): bupropion, calcium carb-cholecalciferol, celecoxib, cyclobenzaprine, diclofenac sodium, esomeprazole, naloxone, omeprazole, oxycodone hcl, [START ON 11/14/2023] oxycodone hcl, [START ON 12/14/2023] oxycodone hcl, and tizanidine, and the following Facility-Administered Medications: fentanyl. His primarily concern today is the Back Pain (R>L)  Initial Vital Signs:  Pulse/HCG Rate: (!) 106ECG Heart Rate: (!) 103 Temp: 97.7 F (36.5 C) Resp: 16 BP: 131/73 SpO2: 97 %  BMI: Estimated body mass index is 25.97 kg/m as calculated from the following:   Height as of this encounter: 5\' 10"  (1.778 m).   Weight as of this encounter: 181 lb (82.1 kg).  Risk Assessment: Allergies: Reviewed. He is allergic to hydrocodone, amoxicillin, and vicodin [hydrocodone-acetaminophen].  Allergy Precautions: None required Coagulopathies: Reviewed. None identified.  Blood-thinner therapy: None at this time Active Infection(s): Reviewed. None identified. Mr. Vandehei is afebrile  Site Confirmation: Mr. Feinstein was asked to confirm the procedure and laterality before marking the site Procedure checklist: Completed Consent: Before the procedure and under the influence of no sedative(s), amnesic(s), or anxiolytics, the patient was informed of the treatment options, risks and possible complications. To fulfill our ethical and legal obligations, as recommended by the American Medical Association's Code of Ethics, I have informed the patient of my clinical impression; the nature and purpose of the treatment or procedure; the risks, benefits, and possible complications of the intervention; the alternatives, including doing nothing; the risk(s) and benefit(s) of the alternative treatment(s) or procedure(s); and the risk(s) and benefit(s) of doing nothing. The  patient was provided information about the general risks and possible complications associated with the  procedure. These may include, but are not limited to: failure to achieve desired goals, infection, bleeding, organ or nerve damage, allergic reactions, paralysis, and death. In addition, the patient was informed of those risks and complications associated to Spine-related procedures, such as failure to decrease pain; infection (i.e.: Meningitis, epidural or intraspinal abscess); bleeding (i.e.: epidural hematoma, subarachnoid hemorrhage, or any other type of intraspinal or peri-dural bleeding); organ or nerve damage (i.e.: Any type of peripheral nerve, nerve root, or spinal cord injury) with subsequent damage to sensory, motor, and/or autonomic systems, resulting in permanent pain, numbness, and/or weakness of one or several areas of the body; allergic reactions; (i.e.: anaphylactic reaction); and/or death. Furthermore, the patient was informed of those risks and complications associated with the medications. These include, but are not limited to: allergic reactions (i.e.: anaphylactic or anaphylactoid reaction(s)); adrenal axis suppression; blood sugar elevation that in diabetics may result in ketoacidosis or comma; water retention that in patients with history of congestive heart failure may result in shortness of breath, pulmonary edema, and decompensation with resultant heart failure; weight gain; swelling or edema; medication-induced neural toxicity; particulate matter embolism and blood vessel occlusion with resultant organ, and/or nervous system infarction; and/or aseptic necrosis of one or more joints. Finally, the patient was informed that Medicine is not an exact science; therefore, there is also the possibility of unforeseen or unpredictable risks and/or possible complications that may result in a catastrophic outcome. The patient indicated having understood very clearly. We have given the patient no  guarantees and we have made no promises. Enough time was given to the patient to ask questions, all of which were answered to the patient's satisfaction. Mr. Carattini has indicated that he wanted to continue with the procedure. Attestation: I, the ordering provider, attest that I have discussed with the patient the benefits, risks, side-effects, alternatives, likelihood of achieving goals, and potential problems during recovery for the procedure that I have provided informed consent. Date  Time: 10/19/2023  9:29 AM  Pre-Procedure Preparation:  Monitoring: As per clinic protocol. Respiration, ETCO2, SpO2, BP, heart rate and rhythm monitor placed and checked for adequate function Safety Precautions: Patient was assessed for positional comfort and pressure points before starting the procedure. Time-out: I initiated and conducted the "Time-out" before starting the procedure, as per protocol. The patient was asked to participate by confirming the accuracy of the "Time Out" information. Verification of the correct person, site, and procedure were performed and confirmed by me, the nursing staff, and the patient. "Time-out" conducted as per Joint Commission's Universal Protocol (UP.01.01.01). Time: 1047 Start Time: 1047 hrs.  Description of Procedure:          Target Area: Caudal Epidural Canal Approach: Midline approach Area Prepped: Entire Posterior Sacrococcygeal Region ChloraPrep (2% chlorhexidine gluconate and 70% isopropyl alcohol) Safety Precautions: Aspiration looking for blood return was conducted prior to all injections. At no point did I inject any substances, as a needle was being advanced. No attempts were made at seeking any paresthesias. Safe injection practices and needle disposal techniques used. Medications properly checked for expiration dates. SDV (single dose vial) medications used. Description of the Procedure: Protocol guidelines were followed. The patient was placed in position over  the fluoroscopy table. Patient assessed for comfort and pressure points before starting procedure. The target area was identified and the area prepped in the usual manner. Skin & deeper tissues infiltrated with local anesthetic. Appropriate amount of time allowed to pass for local anesthetics to take effect. The epidural needle  was then advanced to the target area, via the sacral hiatus, between the sacral cornu. Proper needle placement secured. Negative aspiration confirmed. Step 1: Epidurogram performed by slowly injecting a non-ionic, water-soluble, hypoallergenic, myelogram-compatible radiological contrast. Defect identified and Racz catheter advanced slowly next to rest proximal to it without attempting to perforate or puncture the defect. At this point, the epidural needle was removed. Step 2: Contrast was again injected, this time thru the catheter, under live fluoroscopy, closely observing for vascular uptake or intrathecal spread. Step 3: Once no vascular uptake or intrathecal spread was confirmed, a 2 mL test-dose using 2% PF-Lidocaine with 1:200,000 Epinephrine was injected thru the catheter, while closely monitoring for an increase in heart rate or evidence of spinal anesthesia. Step 4: After waiting over 5 minutes, the patient was assessed for evidence of a spinal block. Step 5: Once I had confirmed that there was no vascular uptake or evidence of intrathecal spread, I then slowly injected 1,500 Units of hyaluronidase, carefully monitoring for allergic reactions. I then waited 10 minutes, using this time to secure the catheter using a sterile benzoin tincture swab and (12mm x 100 mm) steri-strip. After confirming vitals to be stable, the patient was transferred to the recovery area where Mr. Brouillet was kept under constant observation and monitored as per post-sedation protocol. Step 6: After the 10 minutes, I proceeded to slowly inject a steroid solution containing 0.2% MPF-Ropivacaine (4 mL) +  0.9% PF-NSS (5 mL) + SDV Triamcinolone 40 mg/mL (1 mL), in intermittent fashion, asking for systemic symptoms every 0.5cc of injectate. Step 7:  30 minutes later, a neurological exam was conducted for any evidence of a spinal blockade. Step 8:  After confirming the absence of anesthesia, I slowly injected the 10% PF-Hypertonic Saline, stopping to assess, any time the patient described any discomfort. Once the procedure was completed, I removed the catheter, taking time to show the patient its spring tip, and demonstrating to the patient that none had been left behind. EBL: None Materials & Medications used:  1. Racz Tun-L-Kath (Epimed, [place], [place]) Catheter. (or similar)(Perifix 20Gx100cm) 2. 2" Foam Tape 3. Benzoin tincture swab 4. Steri-Strip (12mm x 100 mm) 5. Non-occlusive Transparent Dressing ( TegadermT) 6. Bacteriostatic Filter for Epidural Catheter 7. Epidural Kit for 20G catheter Needle(s) used: 20g - 10cm, Tuohy-style epidural needle   Medications used:  1. Skin infiltration: 2.0% Lidocaine (10ml) 2. Test-dose: 1.5 % Lidocaine w/ Epi 1:200,000 (5ml) 3. Epidural Steroid injection:  A). Steroid: Triamcinolone 40 mg/mL (SDV) (1ml) B). Local Anesthetic: 0.2% PF-Ropivacaine (2 mg/mL) (4 mL) C). Dilution agent: 0.9% PF-NSS (injectable saline) (5 mL) 6. Neurolytic Agent: 10% PF-Sodium Chloride (Hypertonic Saline) (10ml) [23.4% PF-Sodium Chloride (4.239mL) + PF-Sterile H2O (5.729mL) = 10% PF-Sodium Chloride (10mL)] 7. Scar softening agent: Hyaluronidase (150 units/mL) x (10 mL) = 1500 Units 8. Radiological Contrast Media: Isovue-M 200 (10 mL)  Vitals:   10/19/23 1205 10/19/23 1225 10/19/23 1235 10/19/23 1245  BP: (!) 99/55 119/66 (!) 104/54 116/61  Pulse:      Resp: (!) 21 17 20    Temp:    (!) 97.4 F (36.3 C)  TempSrc:    Temporal  SpO2: 94% 98% 96%   Weight:      Height:        Start Time: 1047 hrs. End Time: 1110 hrs. Epidurogram:  Epidurogram flow pattern  demonstrated a restricted low at the level of the surgery. The epidural catheter was placed next to this restriction without attempting to perforate it.  Materials:  Needle(s) Type: Tuohy-style epidural needle Gauge: 17G Length: 3.5-in Medication(s): Please see orders for medications and dosing details.  Imaging Guidance (Spinal):          Type of Imaging Technique: Fluoroscopy Guidance (Spinal) Indication(s): Fluoroscopy guidance for needle placement to enhance accuracy in procedures requiring precise needle localization for targeted delivery of medication in or near specific anatomical locations not easily accessible without such real-time imaging assistance. Exposure Time: Please see nurses notes. Contrast: Before injecting any contrast, we confirmed that the patient did not have an allergy to iodine, shellfish, or radiological contrast. Once satisfactory needle placement was completed at the desired level, radiological contrast was injected. Contrast injected under live fluoroscopy. No contrast complications. See chart for type and volume of contrast used. Fluoroscopic Guidance: I was personally present during the use of fluoroscopy. "Tunnel Vision Technique" used to obtain the best possible view of the target area. Parallax error corrected before commencing the procedure. "Direction-depth-direction" technique used to introduce the needle under continuous pulsed fluoroscopy. Once target was reached, antero-posterior, oblique, and lateral fluoroscopic projection used confirm needle placement in all planes. Images permanently stored in EMR. Interpretation: I personally interpreted the imaging intraoperatively. Adequate needle placement confirmed in multiple planes. Appropriate spread of contrast into desired area was observed. No evidence of afferent or efferent intravascular uptake. No intrathecal or subarachnoid spread observed. Permanent images saved into the patient's record.  Antibiotic  Prophylaxis:   Anti-infectives (From admission, onward)    Start     Dose/Rate Route Frequency Ordered Stop   10/19/23 0956  ceFAZolin (ANCEF) IVPB 1 g/50 mL premix        1 g 100 mL/hr over 30 Minutes Intravenous 30 min pre-op 10/19/23 0956 10/19/23 1029      Indication(s): None identified  Post-operative Assessment:  Post-procedure Vital Signs:  Pulse/HCG Rate: 9985 Temp: (!) 97.4 F (36.3 C) Resp: 20 BP: 116/61 SpO2: 96 %  EBL: None  Complications: No immediate post-treatment complications observed by team, or reported by patient.  Note: The patient tolerated the entire procedure well. A repeat set of vitals were taken after the procedure and the patient was kept under observation following institutional policy, for this type of procedure. Post-procedural neurological assessment was performed, showing return to baseline, prior to discharge. The patient was provided with post-procedure discharge instructions, including a section on how to identify potential problems. Should any problems arise concerning this procedure, the patient was given instructions to immediately contact us, at any time, without hesitation. In any case, we plan to contact the patient by telephone for a follow-up status report regarding this interventional procedure.  Comments:  No additional relevant information.  Plan of Care (POC)  Orders:  Orders Placed This Encounter  Procedures   Racz (One Day)    Equipment: Epidural Tray; Racz Catheter; 10% Hypertonic Saline; Hyaluronidase; 3/4" Steri-strips; 4x4 Sterile Gauze pack.    Scheduling Instructions:     Procedure: RACZ Epidural Lysis of Adhesions     Side: Midline     Sedation: Moderate Conscious Sedation     Timeframe: Today   DG PAIN CLIN[facility] C-ARM 1-60 MIN NO REPORT    Intraoperative interpretation by procedural physician at [facility].    Standing Status:   Standing    Number of Occurrences:   1    Reason for exam::   Assistance in  needle guidance and placement for procedures requiring needle placement in or near specific anatomical locations not easily accessible without such assistance.  Informed Consent Details: Physician/Practitioner Attestation; Transcribe to consent form and obtain patient signature    Nursing Order: Transcribe to consent form and obtain patient signature. Note: Always confirm laterality of pain with Mr. Hazel, before procedure.    Physician/Practitioner attestation of informed consent for procedure/surgical case:   I, the physician/practitioner, attest that I have discussed with the patient the benefits, risks, side effects, alternatives, likelihood of achieving goals and potential problems during recovery for the procedure that I have provided informed consent.    Procedure:   RACZ Procedure (Epidural Lysis of Adhesions)    Physician/Practitioner performing the procedure:   Arek Spadafore A. Barth Borne, MD    Indication/Reason:   Chronic low back pain and lower extremity pain secondary to epidural fibrosis associated with a failed back surgical syndrome   Provide equipment / supplies at bedside    Procedural tray: Epidural Tray (Disposable  single use) Skin infiltration needle: Regular 1.5-in, 25-G, (x1) Block needle size: Regular standard Catheter: RACZ catheter    Standing Status:   Standing    Number of Occurrences:   1    Specify:   Epidural Tray   Verify informed consent Please verify that the patient has received an appropriate explanation of the procedure, including the risks and possible complications. Make sure that all of the patients questions have been answered to the patient's satis...    Please verify that the patient has received an appropriate explanation of the procedure, including the risks and possible complications. Make sure that all of the patients questions have been answered to the patient's satisfaction.    Standing Status:   Standing    Number of Occurrences:   1   Discharge  instructions    Please evaluate and confirm that the patient's neurological status is back to preoperative levels. Confirmed that the patient can void before discharge. Immediately notify the attending physician if the patient is unable to void or if the patient's neurological status shows any deficits. Please provide patient with discharge instructions after the procedure. Scheduled the patient to return for a follow-up 2 weeks after the procedure.    Standing Status:   Standing    Number of Occurrences:   1   Follow-up    Please evaluate the patient's condition and answer any questions that they may have regarding the postoperative care.    Standing Status:   Standing    Number of Occurrences:   1    Specify:   Call the patient the day after the procedure to evaluate their postoperative status.   Saline lock IV    Have LR [phone] mL available and administer at 125 mL/hr if patient becomes hypotensive.    Standing Status:   Standing    Number of Occurrences:   1   Chronic Opioid Analgesic:   Oxycodone IR 10 mg, 1 tab PO q 6 hrs (40 mg/day of oxycodone) MME/day: 60 mg/day.    Medications ordered for procedure: Meds ordered this encounter  Medications   iohexol (OMNIPAQUE) 180 MG/ML injection 10 mL    Must be Myelogram-compatible. If not available, you may substitute with a water-soluble, non-ionic, hypoallergenic, myelogram-compatible radiological contrast medium.   lidocaine (XYLOCAINE) 2 % (with pres) injection 400 mg   pentafluoroprop-tetrafluoroeth (GEBAUERS) aerosol   midazolam (VERSED) 5 MG/5ML injection 0.5-2 mg    Make sure Flumazenil is available in the pyxis when using this medication. If oversedation occurs, administer 0.2 mg IV over 15 sec. If after 45 sec no response, administer 0.2  mg again over 1 min; may repeat at 1 min intervals; not to exceed 4 doses (1 mg)   fentaNYL (SUBLIMAZE) injection 25-50 mcg    Make sure Narcan is available in the pyxis when using this  medication. In the event of respiratory depression (RR< 8/min): Titrate NARCAN (naloxone) in increments of 0.1 to 0.2 mg IV at 2-3 minute intervals, until desired degree of reversal.   ceFAZolin (ANCEF) IVPB 1 g/50 mL premix    Antibiotic Indication::   Surgical Prophylaxis   lidocaine-EPINEPHrine (XYLOCAINE W/EPI) 2 %-1:200000 (PF) injection 10 mL    If 2% is unavailable, may be substituted with 1.5%, but must be preservative-free. If 1:200,000 concentration of epinephrine is not available, it may be substituted with 1:100,000. Notify physician of changes, before procedure.   triamcinolone acetonide (KENALOG-40) injection 40 mg   hyaluronidase Human (HYLENEX) injection 1,500 Units    10 mL of the 150 Units/mL   sodium chloride hypertonic 10% epidural injection    For Racz Epidural Lysis of Adhesions.   sodium chloride flush (NS) 0.9 % injection 4 mL   Medications administered: We administered iohexol, lidocaine, pentafluoroprop-tetrafluoroeth, midazolam, fentaNYL, ceFAZolin, lidocaine-EPINEPHrine, triamcinolone acetonide, hyaluronidase Human, sodium chloride hypertonic 10% epidural injection, and sodium chloride flush.  See the medical record for exact dosing, route, and time of administration.  Follow-up plan:   Return in about 2 weeks (around 11/02/2023) for (Face2F), (PPE).       Interventional Therapies  Risk Factors  Considerations:      Lumbar Hardware: No RFA    Planned  Pending:   Therapeutic left RACZ procedure #9 (04/22/2023)    Under consideration:   Possible bilateral lumbar facet RFA  Diagnostic bilateral SI Blk #1  Possible bilateral SI RFA  Possible spinal cord stimulator trial    Completed:   Therapeutic right L2-3 LESI x1 (08/29/2020)  Diagnostic left lumbar facet MBB x2 (01/15/2022) (100/100/75/75)  Diagnostic right lumbar facet MBB x1 (01/15/2022) (100/100/75/75)  Diagnostic left SI Blk x1 (03/21/2020)  Therapeutic/palliative right lateral epicondyle (elbow)  inj. x2 (01/21/2021)  Therapeutic/palliative left RACZ procedure x8 (targeting the left S2, S3 area) (10/01/2022) (100/100/90/90)  Therapeutic left RACZ procedure #8 (10/01/2022) (+ saddle block - NO HYPERTONIC injected) (100/100/90/95)    Therapeutic  Palliative (PRN) options:   Palliative right lateral epicondyle (elbow) inj. #2  Palliative left RACZ procedure #8 (targeting the left S2, S3 area)    Pharmacotherapy  Nonopioids transferred 05/02/2020: Zanaflex, Flexeril, and Mobic       Recent Visits Date Type Provider Dept  10/06/23 Office Visit Renaldo Caroli, MD [facility]  Showing recent visits within past 90 days and meeting all other requirements Today's Visits Date Type Provider Dept  10/19/23 Procedure visit Renaldo Caroli, MD [facility]  Showing today's visits and meeting all other requirements Future Appointments Date Type Provider Dept  11/04/23 Appointment Renaldo Caroli, MD [facility]  01/10/24 Appointment Patel, Seema K, NP [facility]  Showing future appointments within next 90 days and meeting all other requirements  Disposition: Discharge home  Discharge (Date  Time): 10/19/2023; 1253 hrs.   Primary Care Physician: Lenell Query, PA-C Location: [facility] Note by: Candi Chafe, MD (TTS technology used. I apologize for any typographical errors that were not detected and corrected.) Date: 10/19/2023; Time: 1:05 PM  Disclaimer:  Medicine is not an Visual merchandiser. The only guarantee in medicine is that nothing is guaranteed. It is important to note that  the decision to proceed with this intervention was based on the information collected from the patient. The Data and conclusions were drawn from the patient's questionnaire, the interview, and the physical examination. Because the information was provided in large part by the patient, it cannot be guaranteed that it has  not been purposely or unconsciously manipulated. Every effort has been made to obtain as much relevant data as possible for this evaluation. It is important to note that the conclusions that lead to this procedure are derived in large part from the available data. Always take into account that the treatment will also be dependent on availability of resources and existing treatment guidelines, considered by other Pain Management Practitioners as being common knowledge and practice, at the time of the intervention. For Medico-Legal purposes, it is also important to point out that variation in procedural techniques and pharmacological choices are the acceptable norm. The indications, contraindications, technique, and results of the above procedure should only be interpreted and judged by a Board-Certified Interventional Pain Specialist with extensive familiarity and expertise in the same exact procedure and technique.

## 2023-10-20 ENCOUNTER — Telehealth: Payer: Self-pay

## 2023-10-20 NOTE — Telephone Encounter (Signed)
 Called PP. States he is still hurting but it is some better today and will call if needed.

## 2023-10-21 ENCOUNTER — Ambulatory Visit: Admitting: Pain Medicine

## 2023-11-04 ENCOUNTER — Ambulatory Visit: Attending: Pain Medicine | Admitting: Pain Medicine

## 2023-11-04 ENCOUNTER — Encounter: Payer: Self-pay | Admitting: Pain Medicine

## 2023-11-04 VITALS — BP 122/88 | HR 103 | Temp 97.2°F | Resp 16 | Ht 70.0 in | Wt 181.0 lb

## 2023-11-04 DIAGNOSIS — G8929 Other chronic pain: Secondary | ICD-10-CM | POA: Diagnosis present

## 2023-11-04 DIAGNOSIS — R200 Anesthesia of skin: Secondary | ICD-10-CM | POA: Diagnosis present

## 2023-11-04 DIAGNOSIS — M5417 Radiculopathy, lumbosacral region: Secondary | ICD-10-CM | POA: Diagnosis present

## 2023-11-04 DIAGNOSIS — Z09 Encounter for follow-up examination after completed treatment for conditions other than malignant neoplasm: Secondary | ICD-10-CM | POA: Insufficient documentation

## 2023-11-04 DIAGNOSIS — M5442 Lumbago with sciatica, left side: Secondary | ICD-10-CM | POA: Insufficient documentation

## 2023-11-04 DIAGNOSIS — M5441 Lumbago with sciatica, right side: Secondary | ICD-10-CM | POA: Insufficient documentation

## 2023-11-04 DIAGNOSIS — M79605 Pain in left leg: Secondary | ICD-10-CM | POA: Insufficient documentation

## 2023-11-04 DIAGNOSIS — M79604 Pain in right leg: Secondary | ICD-10-CM | POA: Insufficient documentation

## 2023-11-04 NOTE — Progress Notes (Signed)
 Safety precautions to be maintained throughout the outpatient stay will include: orient to surroundings, keep bed in low position, maintain call bell within reach at all times, provide assistance with transfer out of bed and ambulation.

## 2023-11-04 NOTE — Progress Notes (Signed)
 PROVIDER NOTE: Interpretation of information contained herein should be left to medically-trained personnel. Specific patient instructions are provided elsewhere under "Patient Instructions" section of medical record. This document was created in part using AI and STT-dictation technology, any transcriptional errors that may result from this process are unintentional.  Patient: Derek [name]  Service: E/M   PCP: Lenell Query, PA-C  DOB: 1981/01/29  DOS: 11/04/2023  Provider: Candi Chafe, MD  MRN: [id number]  Delivery: Face-to-face  Specialty: Interventional Pain Management  Type: Established Patient  Setting: Ambulatory outpatient facility  Specialty designation: 09  Referring Prov.: Lenell Query,*  Location: Outpatient office facility       HPI  [name], a 43 y.o. year old male, is here today because of his Chronic bilateral low back pain with bilateral sciatica [M54.42, M54.41, G89.29]. Mr. Wodrich primary complain today is Back Pain  Pertinent problems: Mr. Vance has Lumbar pseudoarthrosis (L5-S1); Chronic low back pain (1ry area of Pain) (Bilateral) (L>R) w/ sciatica (Bilateral); Chronic lower extremity pain (2ry area of Pain) (Bilateral) (L>R); Chronic pain syndrome; Failed back surgical syndrome (x3); L5-S1 pseudoarthrosis; Chronic musculoskeletal pain; Spasm of back muscles; Sacroiliac joint dysfunction (Bilateral); Chronic sacroiliac joint pain (Bilateral) (L>R); Somatic dysfunction of sacroiliac joints (Bilateral); Chronic hip pain (Bilateral); Epidural fibrosis; Lumbar postlaminectomy syndrome; DDD (degenerative disc disease), lumbosacral; Lumbar facet syndrome (Bilateral) (L>R); Other specified dorsopathies, sacral and sacrococcygeal region; Spondylosis without myelopathy or radiculopathy, lumbosacral region; Neurogenic pain; Osteoarthritic spondylosis of lumbar spine; Chronic low back pain (Left) w/o sciatica; Numbness of anterior thigh (Right); Burning  pain in thigh (Right); Chronic low back pain (Midline) w/o sciatica; Enthesopathy of elbow region (Bilateral); Sprain of lateral collateral ligament of elbow, sequela (Left); Sprain of lateral collateral ligament of elbow, sequela (Right); Radicular pain of lumbosacral region; Lumbosacral radiculopathy; Chronic low back pain (Bilateral) w/o sciatica; and Lumbar facet joint pain on their pertinent problem list. Pain Assessment: Severity of Chronic pain is reported as a 1 /10. Location: Back Lower, Right, Left/Radaites to hips bilateral in the evenings and bed time. Onset: More than a month ago. Quality: Aching, Constant. Timing: Constant. Modifying factor(s): Procedure. Vitals:  height is 5\' 10"  (1.778 m) and weight is 181 lb (82.1 kg). His temporal temperature is 97.2 F (36.2 C) (abnormal). His blood pressure is 122/88 and his pulse is 103 (abnormal). His respiration is 16 and oxygen saturation is 96%.  BMI: Estimated body mass index is 25.97 kg/m as calculated from the following:   Height as of this encounter: 5\' 10"  (1.778 m).   Weight as of this encounter: 181 lb (82.1 kg). Last encounter: 10/06/2023. Last procedure: 10/19/2023.  Reason for encounter: post-procedure evaluation and assessment.  Discussed the use of AI scribe software for clinical note transcription with the patient, who gave verbal consent to proceed.  History of Present Illness   Derek [name] is a 43 year old male who presents for follow-up after a RACs procedure.  He has experienced significant improvement following his recent RACs procedure, with approximately 75% reduction in symptoms. The burning sensation and itching along the edge of his leg have subsided, and he no longer experiences these symptoms.  He mentions a recent episode of bronchitis that led to severe coughing, resulting in a pulled muscle beneath his rib. His back feels much better now compared to before.  During the last procedure, he felt more of the  procedure than usual, which he attributes to potential tolerance to pain medications. The  procedure was more painful, but he is primarily concerned with achieving relief.  No burning sensation or itching along the edge of his leg.      Post-procedure evaluation    Procedure:          Anesthesia, Analgesia, Anxiolysis:  Type: Therapeutic Percutaneous Epidural Neuroplasty and Lysis of Adhesions (RACZ Procedure)   #10   Region: Caudal Level: Sacrococcygeal   Laterality: Midline aiming at the left  Anesthesia: Local (1-2% Lidocaine )  Anxiolysis: IV  Sedation: Moderate  Guidance: Fluoroscopy Spinal ([id number])   Position: Prone   1. Chronic low back pain (1ry area of Pain) (Bilateral) (L>R) w/ sciatica (Bilateral)   2. Chronic lower extremity pain (2ry area of Pain) (Bilateral) (L>R)   3. Degeneration of intervertebral disc of lumbosacral region with discogenic back pain and lower extremity pain   4. Epidural fibrosis   5. Failed back surgical syndrome (x3)   6. Lumbosacral radiculopathy   7. Numbness of anterior thigh (Right)   8. Radicular pain of lumbosacral region    NAS-11 Pain score:   Pre-procedure: 8 /10   Post-procedure: 5 /10    Effectiveness:  Initial hour after procedure: 10 %. Subsequent 4-6 hours post-procedure: 10 %. Analgesia past initial 6 hours: 75 %. Ongoing improvement:  Analgesic: The patient indicates having an ongoing 75% improvement of his pain.  He is doing well and he refers not needing anything else at this time. Function: Derek [name] reports improvement in function ROM: Derek [name] reports improvement in ROM   Pharmacotherapy Assessment  Analgesic: Oxycodone  IR 10 mg, 1 tab PO q 6 hrs (40 mg/day of oxycodone ) MME/day: 60 mg/day.   Monitoring: [place] PMP: PDMP reviewed during this encounter.       Pharmacotherapy: No side-effects or adverse reactions reported. Compliance: No problems identified. Effectiveness: Clinically acceptable.  Huston Maiers, RN   11/04/2023  2:07 PM  Sign when Signing Visit Safety precautions to be maintained throughout the outpatient stay will include: orient to surroundings, keep bed in low position, maintain call bell within reach at all times, provide assistance with transfer out of bed and ambulation.     No results found for: "CBDTHCR" No results found for: "D8THCCBX" No results found for: "D9THCCBX"  UDS:  Summary  Date Value Ref Range Status  01/11/2023 Note  Final    Comment:    ==================================================================== ToxASSURE Select 13 (MW) ==================================================================== Specimen Alert ToxAssure, ToxAssure Flex or Mat drug testing: -Engineer, production- Result certification performed at [facility] [employer], [address] [place], [place] [address]. [phone] Lab Director Pearlie Bougie, PhrmD. ==================================================================== Test                             Result       Flag       Units  Drug Present and Declared for Prescription Verification   Oxycodone                       1017         EXPECTED   ng/mg creat   Oxymorphone                    5560         EXPECTED   ng/mg creat   Noroxycodone                   2481  EXPECTED   ng/mg creat   Noroxymorphone                 1161         EXPECTED   ng/mg creat    Sources of oxycodone  are scheduled prescription medications.    Oxymorphone, noroxycodone, and noroxymorphone are expected    metabolites of oxycodone . Oxymorphone is also available as a    scheduled prescription medication.  ==================================================================== Test                      Result    Flag   Units      Ref Range   Creatinine              161              mg/dL      >=53 ==================================================================== Declared Medications:  The flagging and interpretation on this report are based on the   following declared medications.  Unexpected results may arise from  inaccuracies in the declared medications.   **Note: The testing scope of this panel includes these medications:   Oxycodone    **Note: The testing scope of this panel does not include the  following reported medications:   Calcium   Celecoxib (Celebrex)  Cholecalciferol  Cyclobenzaprine  (Flexeril )  Diclofenac  Esomeprazole (Nexium)  Meloxicam  (Mobic )  Naloxone  (Narcan )  Tizanidine  (Zanaflex ) ==================================================================== For clinical consultation, please call [phone]. ====================================================================       ROS  Constitutional: Denies any fever or chills Gastrointestinal: No reported hemesis, hematochezia, vomiting, or acute GI distress Musculoskeletal: Denies any acute onset joint swelling, redness, loss of ROM, or weakness Neurological: No reported episodes of acute onset apraxia, aphasia, dysarthria, agnosia, amnesia, paralysis, loss of coordination, or loss of consciousness  Medication Review  Calcium  Carb-Cholecalciferol, Diclofenac Sodium, Oxycodone  HCl, buPROPion, celecoxib, cyclobenzaprine , esomeprazole, naloxone , omeprazole, and tiZANidine   History Review  Allergy: Mr. Natale is allergic to hydrocodone , amoxicillin, and vicodin [hydrocodone -acetaminophen ]. Drug: Mr. Brettschneider  reports no history of drug use. Alcohol:  reports current alcohol use. Tobacco:  reports that he has been smoking cigarettes. He has a 16 pack-year smoking history. He has quit using smokeless tobacco. Social: Mr. Rohal  reports that he has been smoking cigarettes. He has a 16 pack-year smoking history. He has quit using smokeless tobacco. He reports current alcohol use. He reports that he does not use drugs. Medical:  has a past medical history of Anxiety, Arthritis, and GERD (gastroesophageal reflux disease). Surgical: Mr. Burbach  has a past surgical  history that includes Back surgery; HAND REIMPLANTED; and Lumbar fusion (11/14). Family: family history is not on file.  Laboratory Chemistry Profile   Renal Lab Results  Component Value Date   BUN 11 01/16/2019   CREATININE 1.08 01/16/2019   BCR 10 01/16/2019   GFRAA 99 01/16/2019   GFRNONAA 86 01/16/2019    Hepatic Lab Results  Component Value Date   AST 24 01/16/2019   ALBUMIN 4.6 01/16/2019   ALKPHOS 108 01/16/2019    Electrolytes Lab Results  Component Value Date   NA 143 01/16/2019   K 4.0 01/16/2019   CL 105 01/16/2019   CALCIUM  10.2 01/16/2019   MG 2.0 01/16/2019    Bone Lab Results  Component Value Date   25OHVITD1 39 01/16/2019   25OHVITD2 1.2 01/16/2019   25OHVITD3 38 01/16/2019    Inflammation (CRP: Acute Phase) (ESR: Chronic Phase) Lab Results  Component Value Date   CRP 4 01/16/2019  ESRSEDRATE 29 (H) 01/16/2019         Note: Above Lab results reviewed.  Recent Imaging Review  DG PAIN CLINIC C-ARM 1-60 MIN NO REPORT Fluoro was used, but no Radiologist interpretation will be provided.  Please refer to "NOTES" tab for provider progress note. Note: Reviewed        Physical Exam  General appearance: Well nourished, well developed, and well hydrated. In no apparent acute distress Mental status: Alert, oriented x 3 (person, place, & time)       Respiratory: No evidence of acute respiratory distress Eyes: PERLA Vitals: BP 122/88 (Patient Position: Sitting, Cuff Size: Normal)   Pulse (!) 103   Temp (!) 97.2 F (36.2 C) (Temporal)   Resp 16   Ht 5\' 10"  (1.778 m)   Wt 181 lb (82.1 kg)   SpO2 96%   BMI 25.97 kg/m  BMI: Estimated body mass index is 25.97 kg/m as calculated from the following:   Height as of this encounter: 5\' 10"  (1.778 m).   Weight as of this encounter: 181 lb (82.1 kg). Ideal: Ideal body weight: 73 kg (160 lb 15 oz) Adjusted ideal body weight: 76.6 kg (168 lb 15.4 oz)  Assessment   Diagnosis Status  1. Chronic low back  pain (1ry area of Pain) (Bilateral) (L>R) w/ sciatica (Bilateral)   2. Chronic lower extremity pain (2ry area of Pain) (Bilateral) (L>R)   3. Lumbosacral radiculopathy   4. Numbness of anterior thigh (Right)   5. Radicular pain of lumbosacral region   6. Postop check    Controlled Controlled Controlled   Updated Problems: No problems updated.  Plan of Care  Problem-specific:  Assessment and Plan    Pain medication tolerance   He has developed tolerance to pain medications due to prolonged use, potentially affecting his efficacy during procedures and anesthesia. Consider a drug holiday for at least fourteen days to reset medication tolerance. Inform the anesthesiologist of this tolerance before any surgical procedures.  Scar tissue management   He inquired about medication effects on scar tissue, which temporarily expands but does not permanently dissolve it. Over time, the scar tissue may shrink again. Continue the current management approach and consider additional stretching exercises post-procedure to aid in scar tissue management.  Muscle strain due to coughing   He experienced a muscle strain under the rib from severe coughing during a recent bronchitis episode. The strain is improving.  Bronchitis   He had a recent bronchitis episode, causing severe coughing and subsequent muscle strain. The bronchitis has resolved.  Numbness in leg   He reports a seventy-five percent improvement in leg numbness, with the burning sensation no longer present, indicating positive progress.       Mr. MAHER LAUDADIO has a current medication list which includes the following long-term medication(s): calcium  carb-cholecalciferol, esomeprazole, omeprazole, oxycodone  hcl, [START ON 11/14/2023] oxycodone  hcl, [START ON 12/14/2023] oxycodone  hcl, and tizanidine .  Pharmacotherapy (Medications Ordered): No orders of the defined types were placed in this encounter.  Orders:  Orders Placed This  Encounter  Procedures   Nursing Instructions:    Please complete this patient's postprocedure evaluation.    Scheduling Instructions:     Please complete this patient's postprocedure evaluation.   Follow-up plan:   Return if symptoms worsen or fail to improve.     Interventional Therapies  Risk Factors  Considerations:      Lumbar Hardware: No RFA    Planned  Pending:   Therapeutic left RACZ  procedure #9 (04/22/2023)    Under consideration:   Possible bilateral lumbar facet RFA  Diagnostic bilateral SI Blk #1  Possible bilateral SI RFA  Possible spinal cord stimulator trial    Completed:   Therapeutic right L2-3 LESI x1 (08/29/2020)  Diagnostic left lumbar facet MBB x2 (01/15/2022) (100/100/75/75)  Diagnostic right lumbar facet MBB x1 (01/15/2022) (100/100/75/75)  Diagnostic left SI Blk x1 (03/21/2020)  Therapeutic/palliative right lateral epicondyle (elbow) inj. x2 (01/21/2021)  Therapeutic/palliative left RACZ procedure x8 (targeting the left S2, S3 area) (10/01/2022) (100/100/90/90)  Therapeutic left RACZ procedure #8 (10/01/2022) (+ saddle block - NO HYPERTONIC injected) (100/100/90/95)    Therapeutic  Palliative (PRN) options:   Palliative right lateral epicondyle (elbow) inj. #2  Palliative left RACZ procedure #8 (targeting the left S2, S3 area)    Pharmacotherapy  Nonopioids transferred 05/02/2020: Zanaflex , Flexeril , and Mobic       Recent Visits Date Type Provider Dept  10/19/23 Procedure visit Renaldo Caroli, MD [facility]  10/06/23 Office Visit Renaldo Caroli, MD [facility]  Showing recent visits within past 90 days and meeting all other requirements Today's Visits Date Type Provider Dept  11/04/23 Office Visit Renaldo Caroli, MD [facility]  Showing today's visits and meeting all other requirements Future Appointments Date Type Provider Dept  01/10/24 Appointment Patel, Seema K, NP [facility]   Showing future appointments within next 90 days and meeting all other requirements  I discussed the assessment and treatment plan with the patient. The patient was provided an opportunity to ask questions and all were answered. The patient agreed with the plan and demonstrated an understanding of the instructions.  Patient advised to call back or seek an in-person evaluation if the symptoms or condition worsens.  Duration of encounter: 35 minutes.  Total time on encounter, as per AMA guidelines included both the face-to-face and non-face-to-face time personally spent by the physician and/or other qualified health care professional(s) on the day of the encounter (includes time in activities that require the physician or other qualified health care professional and does not include time in activities normally performed by clinical staff). Physician's time may include the following activities when performed: Preparing to see the patient (e.g., pre-charting review of records, searching for previously ordered imaging, lab work, and nerve conduction tests) Review of prior analgesic pharmacotherapies. Reviewing PMP Interpreting ordered tests (e.g., lab work, imaging, nerve conduction tests) Performing post-procedure evaluations, including interpretation of diagnostic procedures Obtaining and/or reviewing separately obtained history Performing a medically appropriate examination and/or evaluation Counseling and educating the patient/family/caregiver Ordering medications, tests, or procedures Referring and communicating with other health care professionals (when not separately reported) Documenting clinical information in the electronic or other health record Independently interpreting results (not separately reported) and communicating results to the patient/ family/caregiver Care coordination (not separately reported)  Note by: Candi Chafe, MD (TTS and AI technology used. I apologize for any  typographical errors that were not detected and corrected.) Date: 11/04/2023; Time: 2:58 PM

## 2024-01-10 ENCOUNTER — Ambulatory Visit: Attending: Nurse Practitioner | Admitting: Nurse Practitioner

## 2024-01-10 ENCOUNTER — Encounter: Payer: Self-pay | Admitting: Nurse Practitioner

## 2024-01-10 VITALS — BP 123/78 | HR 111 | Temp 99.0°F | Resp 20 | Ht 70.0 in | Wt 180.0 lb

## 2024-01-10 DIAGNOSIS — G8929 Other chronic pain: Secondary | ICD-10-CM | POA: Diagnosis present

## 2024-01-10 DIAGNOSIS — M5441 Lumbago with sciatica, right side: Secondary | ICD-10-CM | POA: Diagnosis not present

## 2024-01-10 DIAGNOSIS — M5459 Other low back pain: Secondary | ICD-10-CM | POA: Diagnosis present

## 2024-01-10 DIAGNOSIS — M79605 Pain in left leg: Secondary | ICD-10-CM | POA: Diagnosis present

## 2024-01-10 DIAGNOSIS — R52 Pain, unspecified: Secondary | ICD-10-CM | POA: Insufficient documentation

## 2024-01-10 DIAGNOSIS — R200 Anesthesia of skin: Secondary | ICD-10-CM | POA: Diagnosis present

## 2024-01-10 DIAGNOSIS — S32009K Unspecified fracture of unspecified lumbar vertebra, subsequent encounter for fracture with nonunion: Secondary | ICD-10-CM | POA: Diagnosis present

## 2024-01-10 DIAGNOSIS — G894 Chronic pain syndrome: Secondary | ICD-10-CM | POA: Insufficient documentation

## 2024-01-10 DIAGNOSIS — M961 Postlaminectomy syndrome, not elsewhere classified: Secondary | ICD-10-CM | POA: Insufficient documentation

## 2024-01-10 DIAGNOSIS — M47816 Spondylosis without myelopathy or radiculopathy, lumbar region: Secondary | ICD-10-CM | POA: Diagnosis present

## 2024-01-10 DIAGNOSIS — M5442 Lumbago with sciatica, left side: Secondary | ICD-10-CM | POA: Diagnosis present

## 2024-01-10 DIAGNOSIS — M5417 Radiculopathy, lumbosacral region: Secondary | ICD-10-CM | POA: Insufficient documentation

## 2024-01-10 DIAGNOSIS — Z79899 Other long term (current) drug therapy: Secondary | ICD-10-CM | POA: Insufficient documentation

## 2024-01-10 DIAGNOSIS — Z79891 Long term (current) use of opiate analgesic: Secondary | ICD-10-CM | POA: Diagnosis present

## 2024-01-10 DIAGNOSIS — G96198 Other disorders of meninges, not elsewhere classified: Secondary | ICD-10-CM | POA: Diagnosis present

## 2024-01-10 DIAGNOSIS — M960 Pseudarthrosis after fusion or arthrodesis: Secondary | ICD-10-CM | POA: Insufficient documentation

## 2024-01-10 DIAGNOSIS — M4726 Other spondylosis with radiculopathy, lumbar region: Secondary | ICD-10-CM | POA: Diagnosis not present

## 2024-01-10 DIAGNOSIS — M79604 Pain in right leg: Secondary | ICD-10-CM | POA: Diagnosis present

## 2024-01-10 MED ORDER — OXYCODONE HCL 10 MG PO TABS: 10.0000 mg | ORAL_TABLET | Freq: Four times a day (QID) | ORAL | 0 refills | Status: DC | PRN

## 2024-01-10 NOTE — Progress Notes (Signed)
 PROVIDER NOTE: Interpretation of information contained herein should be left to medically-trained personnel. Specific patient instructions are provided elsewhere under Patient Instructions section of medical record. This document was created in part using AI and STT-dictation technology, any transcriptional errors that may result from this process are unintentional.  Patient: Derek [name]  Service: E/M   PCP: Cyrus Selinda Moose, PA-C  DOB: 1980/09/07  DOS: 01/10/2024  Provider: Emmy MARLA Blanch, NP  MRN: [id number]  Delivery: Face-to-face  Specialty: Interventional Pain Management  Type: Established Patient  Setting: Ambulatory outpatient facility  Specialty designation: 09  Referring Prov.: Cyrus Selinda Moose,*  Location: Outpatient office facility       History of present illness (HPI) Derek [name], a 43 y.o. year old male, is here today because of his Lumbar facet joint syndrome [M47.816]. Derek [name] primary complain today is Back Pain (Low back right side feels close to L4)  Pertinent problems: Derek [name] has Lumbar pseudoarthrosis (L5-S1); Chronic low back pain (1ry area of pain) (Bilateral) (L>R) w/sciatica (Bilateral); Chronic lower extremity pain (2ry area of pain) (Bilateral) (L>R); Chronic pain syndrome; Failed back surgical syndrome (x3); Chronic musculoskeletal pain; Spasm of back muscles; Sacroiliac joint dysfunction (bilateral) and Neurogenic pain on their pertinent problem list.  Pain Assessment: Severity of Chronic pain is reported as a 8 /10. Location: Back Right/low back to buttocks. Onset: More than a month ago. Quality: Shooting, Constant, Other (Comment). Timing: Constant. Modifying factor(s): nothing. Vitals:  height is 5' 10 (1.778 m) and weight is 180 lb (81.6 kg). His skin temperature is 99 F (37.2 C). His blood pressure is 123/78 and his pulse is 111 (abnormal). His respiration is 20 and oxygen saturation is 98%.  BMI: Estimated body mass index is 25.83 kg/m  as calculated from the following:   Height as of this encounter: 5' 10 (1.778 m).   Weight as of this encounter: 180 lb (81.6 kg).  Last encounter: 11/04/2023 Last procedure: 10/19/2023  Reason for encounter: medication management.  The patient indicates doing well with current medication regimen.  No adverse reaction or side effects reported to medication.  The patient is experiencing bilateral lower back pain with any movement, likely due to lumbar facet arthropathy.   Pharmacotherapy Assessment   Oxycodone  HCl 10 mg tablets every 6 hours as needed for pain. MME=60 Monitoring: [place] PMP: PDMP reviewed during this encounter.       Pharmacotherapy: No side-effects or adverse reactions reported. Compliance: No problems identified. Effectiveness: Clinically acceptable.  Jamelle Orie SAILOR, RN  01/10/2024  8:40 AM  Sign when Signing Visit Nursing Pain Medication Assessment:  Safety precautions to be maintained throughout the outpatient stay will include: orient to surroundings, keep bed in low position, maintain call bell within reach at all times, provide assistance with transfer out of bed and ambulation.  Medication Inspection Compliance: Pill count conducted under aseptic conditions, in front of the patient. Neither the pills nor the bottle was removed from the patient's sight at any time. Once count was completed pills were immediately returned to the patient in their original bottle.  Medication: Oxycodone  IR Pill/Patch Count: 9 of 120 patches remain Pill/Patch Appearance: Markings consistent with prescribed medication Bottle Appearance: Standard pharmacy container. Clearly labeled. Filled Date: 06 / 10 / 2025 Last Medication intake:  Today  Safety precautions to be maintained throughout the outpatient stay will include: orient to surroundings, keep bed in low position, maintain call bell within reach at all times, provide assistance with transfer out  of bed and ambulation.     UDS:   Summary  Date Value Ref Range Status  01/11/2023 Note  Final    Comment:    ==================================================================== Tox[employer] Select 13 (MW) ==================================================================== Specimen Alert ToxAssure, ToxAssure Flex or Mat drug testing: -Engineer, production- Result certification performed at [facility], [address] [place], [place] [address]. [phone] Lab Director Arnulfo Finder, PhrmD. ==================================================================== Test                             Result       Flag       Units  Drug Present and Declared for Prescription Verification   Oxycodone                       1017         EXPECTED   ng/mg creat   Oxymorphone                    5560         EXPECTED   ng/mg creat   Noroxycodone                   2481         EXPECTED   ng/mg creat   Noroxymorphone                 1161         EXPECTED   ng/mg creat    Sources of oxycodone  are scheduled prescription medications.    Oxymorphone, noroxycodone, and noroxymorphone are expected    metabolites of oxycodone . Oxymorphone is also available as a    scheduled prescription medication.  ==================================================================== Test                      Result    Flag   Units      Ref Range   Creatinine              161              mg/dL      >=79 ==================================================================== Declared Medications:  The flagging and interpretation on this report are based on the  following declared medications.  Unexpected results may arise from  inaccuracies in the declared medications.   **Note: The testing scope of this panel includes these medications:   Oxycodone    **Note: The testing scope of this panel does not include the  following reported medications:   Calcium   Celecoxib (Celebrex)  Cholecalciferol  Cyclobenzaprine  (Flexeril )  Diclofenac   Esomeprazole (Nexium)  Meloxicam  (Mobic )  Naloxone  (Narcan )  Tizanidine  (Zanaflex ) ==================================================================== For clinical consultation, please call [phone]. ====================================================================     No results found for: CBDTHCR No results found for: D8THCCBX No results found for: D9THCCBX  ROS  Constitutional: Denies any fever or chills Gastrointestinal: No reported hemesis, hematochezia, vomiting, or acute GI distress Musculoskeletal: Low back pain right side close to L4 and across bilateral  Neurological: No reported episodes of acute onset apraxia, aphasia, dysarthria, agnosia, amnesia, paralysis, loss of coordination, or loss of consciousness  Medication Review  Calcium  Carb-Cholecalciferol, Diclofenac Sodium, Oxycodone  HCl, celecoxib, cyclobenzaprine , esomeprazole, naloxone , omeprazole, and tiZANidine   History Review  Allergy: Derek [name] is allergic to hydrocodone , amoxicillin, and vicodin [hydrocodone -acetaminophen ]. Drug: Derek [name]  reports no history of drug use. Alcohol:  reports current alcohol use. Tobacco:  reports that he has been  smoking cigarettes. He has a 16 pack-year smoking history. He has quit using smokeless tobacco. Social: Derek [name]  reports that he has been smoking cigarettes. He has a 16 pack-year smoking history. He has quit using smokeless tobacco. He reports current alcohol use. He reports that he does not use drugs. Medical:  has a past medical history of Anxiety, Arthritis, and GERD (gastroesophageal reflux disease). Surgical: Derek [name]  has a past surgical history that includes Back surgery; HAND REIMPLANTED; and Lumbar fusion (11/14). Family: family history is not on file.  Laboratory Chemistry Profile   Renal Lab Results  Component Value Date   BUN 11 01/16/2019   CREATININE 1.08 01/16/2019   BCR 10 01/16/2019   GFRAA 99 01/16/2019   GFRNONAA 86 01/16/2019     Hepatic Lab Results  Component Value Date   AST 24 01/16/2019   ALBUMIN 4.6 01/16/2019   ALKPHOS 108 01/16/2019    Electrolytes Lab Results  Component Value Date   NA 143 01/16/2019   K 4.0 01/16/2019   CL 105 01/16/2019   CALCIUM  10.2 01/16/2019   MG 2.0 01/16/2019    Bone Lab Results  Component Value Date   25OHVITD1 39 01/16/2019   25OHVITD2 1.2 01/16/2019   25OHVITD3 38 01/16/2019    Inflammation (CRP: Acute Phase) (ESR: Chronic Phase) Lab Results  Component Value Date   CRP 4 01/16/2019   ESRSEDRATE 29 (H) 01/16/2019         Note: Above Lab results reviewed.  Recent Imaging Review  DG PAIN CLINIC C-ARM 1-60 MIN NO REPORT Fluoro was used, but no Radiologist interpretation will be provided.  Please refer to NOTES tab for provider progress note. Note: Reviewed        Physical Exam  Vitals: BP 123/78 (BP Location: Right Arm, Patient Position: Sitting, Cuff Size: Normal)   Pulse (!) 111   Temp 99 F (37.2 C) (Skin)   Resp 20   Ht 5' 10 (1.778 m)   Wt 180 lb (81.6 kg)   SpO2 98%   BMI 25.83 kg/m  BMI: Estimated body mass index is 25.83 kg/m as calculated from the following:   Height as of this encounter: 5' 10 (1.778 m).   Weight as of this encounter: 180 lb (81.6 kg). Ideal: Ideal body weight: 73 kg (160 lb 15 oz) Adjusted ideal body weight: 76.5 kg (168 lb 9 oz) General appearance: Well nourished, well developed, and well hydrated. In no apparent acute distress Mental status: Alert, oriented x 3 (person, place, & time)       Respiratory: No evidence of acute respiratory distress Eyes: PERLA  Lumbar Exam  Skin & Axial Inspection: No masses, redness, or swelling Alignment: Symmetrical Functional ROM: Pain restricted ROM affecting both sides Stability: No instability detected Muscle Tone/Strength: Functionally intact. No obvious neuro-muscular anomalies detected. Sensory (Neurological): Musculoskeletal pain pattern Palpation: No palpable  anomalies       Provocative Tests: Hyperextension/rotation test: Positive bilaterally for facet joint pain. Lumbar quadrant test (Kemp's test): (+) bilaterally for facet joint pain. Lateral bending test: deferred today        Assessment   Diagnosis Status  1. Lumbar facet syndrome (Bilateral) (L>R)   2. Lumbar facet joint pain   3. Failed back surgical syndrome (x3)   4. Lumbar pseudoarthrosis (L5-S1)   5. Chronic low back pain (1ry area of Pain) (Bilateral) (L>R) w/ sciatica (Bilateral)   6. Chronic lower extremity pain (2ry area of Pain) (Bilateral) (L>R)   7.  Radicular pain of lumbosacral region   8. Burning pain in thigh (Right)   9. Numbness of anterior thigh (Right)   10. Lumbar postlaminectomy syndrome   11. Epidural fibrosis   12. L5-S1 pseudoarthrosis   13. Chronic pain syndrome   14. Pharmacologic therapy   15. Chronic use of opiate for therapeutic purpose   16. Encounter for medication management   17. Encounter for chronic pain management    Having a Flare-up Having a Flare-up Controlled   Updated Problems: No problems updated.  Plan of Care  Problem-specific:  Assessment and Plan We will continue on current medication regimen.  Prescribing drug monitoring (PDMP) reviewed; findings consistent with the use of prescribed medication and no evidence of narcotic misuse or abuse. Routine UDS ordered today.  Schedule follow-up in 90 days for medication management with Emmy Blanch, NP.   Derek [name] has a current medication list which includes the following long-term medication(s): calcium  carb-cholecalciferol, esomeprazole, [START ON 01/13/2024] oxycodone  hcl, [START ON 02/12/2024] oxycodone  hcl, [START ON 03/13/2024] oxycodone  hcl, oxycodone  hcl, tizanidine , and omeprazole.  Pharmacotherapy (Medications Ordered): Meds ordered this encounter  Medications   Oxycodone  HCl 10 MG TABS    Sig: Take 1 tablet (10 mg total) by mouth every 6 (six) hours as needed. Must last  30 days    Dispense:  120 tablet    Refill:  0    DO NOT: delete (not duplicate); no partial-fill (will deny script to complete), no refill request (F/U required). DISPENSE: 1 day early if closed on fill date. WARN: No CNS-depressants within 8 hrs of med.   Oxycodone  HCl 10 MG TABS    Sig: Take 1 tablet (10 mg total) by mouth every 6 (six) hours as needed. Must last 30 days    Dispense:  120 tablet    Refill:  0    DO NOT: delete (not duplicate); no partial-fill (will deny script to complete), no refill request (F/U required). DISPENSE: 1 day early if closed on fill date. WARN: No CNS-depressants within 8 hrs of med.   Oxycodone  HCl 10 MG TABS    Sig: Take 1 tablet (10 mg total) by mouth every 6 (six) hours as needed. Must last 30 days    Dispense:  120 tablet    Refill:  0    DO NOT: delete (not duplicate); no partial-fill (will deny script to complete), no refill request (F/U required). DISPENSE: 1 day early if closed on fill date. WARN: No CNS-depressants within 8 hrs of med.   Orders:  Orders Placed This Encounter  Procedures   LUMBAR FACET(MEDIAL BRANCH NERVE BLOCK) MBNB    Diagnosis: Lumbar Facet Syndrome (M47.816); Lumbosacral Facet Syndrome (M47.817); Lumbar Facet Joint Pain (M54.59) Medical Necessity Statement: 1.Severe chronic axial low back pain causing functional impairment documented by ongoing pain scale assessments. 2.Pain present for longer than 3 months (Chronic) documented to have failed noninvasive conservative therapies. 3.Absence of untreated radiculopathy. 4.There is no radiological evidence of untreated fractures, tumor, infection, or deformity.  Physical Examination Findings: Positive Kemp Maneuver: (Y)  Positive Lumbar Hyperextension-Rotation provocative test: (Y)    Standing Status:   Future    Expiration Date:   04/11/2024    Scheduling Instructions:     Procedure: Lumbar facet Block     Type: Medial Branch Block     Side: Bilateral     Purpose:  Diagnostic/Therapeutic     Level(s): L3-4, L4-5, L5-S1, and TBD by Fluoroscopic Mapping Facets (L2, L3, L4,  L5, S1, and TBD Medial Branch)     Sedation: With Sedation.     Timeframe: As soon as schedule allows.    Where will this procedure be performed?:   [facility]   [employer] Select 13 (MW), Urine    Volume: 30 ml(s). Minimum 3 ml of urine is needed. Document temperature of fresh sample. Indications: Long term (current) use of opiate analgesic (S20.108)    Release to patient:   Immediate        Return in about 3 months (around 04/11/2024) for (F2F), (MM), Emmy Blanch NP.    Recent Visits Date Type Provider Dept  11/04/23 Office Visit Tanya Glisson, MD [facility]  10/19/23 Procedure visit Tanya Glisson, MD [facility]  Showing recent visits within past 90 days and meeting all other requirements Today's Visits Date Type Provider Dept  01/10/24 Office Visit Mae Denunzio K, NP [facility]  Showing today's visits and meeting all other requirements Future Appointments Date Type Provider Dept  04/06/24 Appointment Ajai Terhaar K, NP [facility]  Showing future appointments within next 90 days and meeting all other requirements  I discussed the assessment and treatment plan with the patient. The patient was provided an opportunity to ask questions and all were answered. The patient agreed with the plan and demonstrated an understanding of the instructions.  Patient advised to call back or seek an in-person evaluation if the symptoms or condition worsens.  Duration of encounter: 30 minutes.  Total time on encounter, as per AMA guidelines included both the face-to-face and non-face-to-face time personally spent by the physician and/or other qualified health care professional(s) on the day of the encounter (includes time in activities that require the physician or other qualified health care professional and does not include time  in activities normally performed by clinical staff). Physician's time may include the following activities when performed: Preparing to see the patient (e.g., pre-charting review of records, searching for previously ordered imaging, lab work, and nerve conduction tests) Review of prior analgesic pharmacotherapies. Reviewing PMP Interpreting ordered tests (e.g., lab work, imaging, nerve conduction tests) Performing post-procedure evaluations, including interpretation of diagnostic procedures Obtaining and/or reviewing separately obtained history Performing a medically appropriate examination and/or evaluation Counseling and educating the patient/family/caregiver Ordering medications, tests, or procedures Referring and communicating with other health care professionals (when not separately reported) Documenting clinical information in the electronic or other health record Independently interpreting results (not separately reported) and communicating results to the patient/ family/caregiver Care coordination (not separately reported)  Note by: Delman Goshorn K Cintya Daughety, NP (TTS and AI technology used. I apologize for any typographical errors that were not detected and corrected.) Date: 01/10/2024; Time: 10:21 AM

## 2024-01-10 NOTE — Progress Notes (Signed)
 Nursing Pain Medication Assessment:  Safety precautions to be maintained throughout the outpatient stay will include: orient to surroundings, keep bed in low position, maintain call bell within reach at all times, provide assistance with transfer out of bed and ambulation.  Medication Inspection Compliance: Pill count conducted under aseptic conditions, in front of the patient. Neither the pills nor the bottle was removed from the patient's sight at any time. Once count was completed pills were immediately returned to the patient in their original bottle.  Medication: Oxycodone  IR Pill/Patch Count: 9 of 120 patches remain Pill/Patch Appearance: Markings consistent with prescribed medication Bottle Appearance: Standard pharmacy container. Clearly labeled. Filled Date: 06 / 10 / 2025 Last Medication intake:  Today  Safety precautions to be maintained throughout the outpatient stay will include: orient to surroundings, keep bed in low position, maintain call bell within reach at all times, provide assistance with transfer out of bed and ambulation.

## 2024-01-13 LAB — TOXASSURE SELECT 13 (MW), URINE

## 2024-01-20 ENCOUNTER — Ambulatory Visit
Admission: RE | Admit: 2024-01-20 | Discharge: 2024-01-20 | Disposition: A | Discharge status: Home / Self Care | Source: Ambulatory Visit | Attending: Pain Medicine | Admitting: Pain Medicine

## 2024-01-20 ENCOUNTER — Ambulatory Visit (HOSPITAL_BASED_OUTPATIENT_CLINIC_OR_DEPARTMENT_OTHER): Admitting: Pain Medicine

## 2024-01-20 ENCOUNTER — Encounter: Payer: Self-pay | Admitting: Pain Medicine

## 2024-01-20 VITALS — BP 128/72 | HR 80 | Temp 98.0°F | Resp 15 | Ht 70.0 in | Wt 170.0 lb

## 2024-01-20 DIAGNOSIS — M961 Postlaminectomy syndrome, not elsewhere classified: Secondary | ICD-10-CM | POA: Insufficient documentation

## 2024-01-20 DIAGNOSIS — Y828 Other medical devices associated with adverse incidents: Secondary | ICD-10-CM | POA: Diagnosis not present

## 2024-01-20 DIAGNOSIS — M47816 Spondylosis without myelopathy or radiculopathy, lumbar region: Secondary | ICD-10-CM

## 2024-01-20 DIAGNOSIS — M5459 Other low back pain: Secondary | ICD-10-CM

## 2024-01-20 DIAGNOSIS — M545 Low back pain, unspecified: Secondary | ICD-10-CM | POA: Diagnosis not present

## 2024-01-20 DIAGNOSIS — Y838 Other surgical procedures as the cause of abnormal reaction of the patient, or of later complication, without mention of misadventure at the time of the procedure: Secondary | ICD-10-CM | POA: Diagnosis not present

## 2024-01-20 DIAGNOSIS — G8929 Other chronic pain: Secondary | ICD-10-CM | POA: Diagnosis not present

## 2024-01-20 DIAGNOSIS — M47817 Spondylosis without myelopathy or radiculopathy, lumbosacral region: Secondary | ICD-10-CM | POA: Insufficient documentation

## 2024-01-20 DIAGNOSIS — T85848A Pain due to other internal prosthetic devices, implants and grafts, initial encounter: Secondary | ICD-10-CM | POA: Diagnosis not present

## 2024-01-20 DIAGNOSIS — S32009K Unspecified fracture of unspecified lumbar vertebra, subsequent encounter for fracture with nonunion: Secondary | ICD-10-CM

## 2024-01-20 DIAGNOSIS — T85848S Pain due to other internal prosthetic devices, implants and grafts, sequela: Secondary | ICD-10-CM

## 2024-01-20 MED ORDER — TRIAMCINOLONE ACETONIDE 40 MG/ML IJ SUSP
INTRAMUSCULAR | Status: AC
  Filled 2024-01-20: qty 2

## 2024-01-20 MED ORDER — MIDAZOLAM HCL 5 MG/5ML IJ SOLN
0.5000 mg | Freq: Once | INTRAMUSCULAR | Status: AC
  Administered 2024-01-20: 2 mg via INTRAVENOUS

## 2024-01-20 MED ORDER — LIDOCAINE HCL 2 % IJ SOLN
INTRAMUSCULAR | Status: AC
  Filled 2024-01-20: qty 20

## 2024-01-20 MED ORDER — ROPIVACAINE HCL 2 MG/ML IJ SOLN
18.0000 mL | Freq: Once | INTRAMUSCULAR | Status: AC
  Administered 2024-01-20: 18 mL via PERINEURAL

## 2024-01-20 MED ORDER — PENTAFLUOROPROP-TETRAFLUOROETH EX AERO
INHALATION_SPRAY | Freq: Once | CUTANEOUS | Status: AC
  Administered 2024-01-20: 30 via TOPICAL

## 2024-01-20 MED ORDER — TRIAMCINOLONE ACETONIDE 40 MG/ML IJ SUSP
80.0000 mg | Freq: Once | INTRAMUSCULAR | Status: AC
  Administered 2024-01-20: 80 mg

## 2024-01-20 MED ORDER — FENTANYL CITRATE (PF) 100 MCG/2ML IJ SOLN
INTRAMUSCULAR | Status: AC
  Filled 2024-01-20: qty 2

## 2024-01-20 MED ORDER — ROPIVACAINE HCL 2 MG/ML IJ SOLN
INTRAMUSCULAR | Status: AC
  Filled 2024-01-20: qty 20

## 2024-01-20 MED ORDER — LIDOCAINE HCL 2 % IJ SOLN
20.0000 mL | Freq: Once | INTRAMUSCULAR | Status: AC
  Administered 2024-01-20: 400 mg

## 2024-01-20 MED ORDER — FENTANYL CITRATE (PF) 100 MCG/2ML IJ SOLN
25.0000 ug | INTRAMUSCULAR | Status: DC | PRN
  Administered 2024-01-20: 50 ug via INTRAVENOUS

## 2024-01-20 MED ORDER — MIDAZOLAM HCL 5 MG/5ML IJ SOLN
INTRAMUSCULAR | Status: AC
Start: 2024-01-20 — End: 2024-01-20
  Filled 2024-01-20: qty 5

## 2024-01-20 NOTE — Patient Instructions (Signed)

## 2024-01-20 NOTE — Progress Notes (Signed)
 PROVIDER NOTE: Interpretation of information contained herein should be left to medically-trained personnel. Specific patient instructions are provided elsewhere under Patient Instructions section of medical record. This document was created in part using STT-dictation technology, any transcriptional errors that may result from this process are unintentional.  Patient: Derek [name] Type: Established DOB: Feb 22, 1981 MRN: [id number] PCP: Cyrus Selinda Moose, PA-C  Service: Procedure DOS: 01/20/2024 Setting: Ambulatory Location: Ambulatory outpatient facility Delivery: Face-to-face Provider: Eric DELENA Como, MD Specialty: Interventional Pain Management Specialty designation: [id number] Location: Outpatient facility Ref. Prov.: Patel, Seema K, NP       Interventional Therapy   Type: Lumbar Fusion (Pedicle screw) Hardware Block (Medial Branch Block)   R2L3  Laterality: Bilateral  Level: L3, L4, L5, and S1 Medial Branch/Dorsal Rami Level(s). Injecting these levels blocks the L4-5 and L5-S1 lumbar facet joints. Imaging: Fluoroscopic guidance Spinal ([id number]) Anesthesia: Local anesthesia (1-2% Lidocaine ) Anxiolysis: IV Versed  2.0 mg Sedation: Moderate Sedation Fentanyl  1 mL (50 mcg) DOS: 01/20/2024 Performed by: Eric DELENA Como, MD  Primary Purpose: Diagnostic/Therapeutic Indications: Low back pain severe enough to impact quality of life or function. 1. Chronic low back pain (Bilateral) w/o sciatica   2. Failed back surgical syndrome (x3)   3. Pain from implanted hardware, sequela   4. Lumbar facet joint pain   5. Lumbar postlaminectomy syndrome   6. Lumbar facet syndrome (Bilateral) (L>R)   7. Lumbar pseudoarthrosis (L5-S1)   8. Spondylosis without myelopathy or radiculopathy, lumbosacral region    NAS-11 Pain score:   Pre-procedure: 5 /10   Post-procedure: 2  (right radiates to buttock)/10     Position / Prep / Materials:  Position: Prone  Prep solution: ChloraPrep (2%  chlorhexidine  gluconate and 70% isopropyl alcohol) Area Prepped: Posterolateral Lumbosacral Spine (Wide prep: From the lower border of the scapula down to the end of the tailbone and from flank to flank.)  Materials:  Tray: Block Needle(s):  Type: Spinal  Gauge (G): 22  Length: 3.5-in Qty: 4     H&P (Pre-op Assessment):  Derek [name] is a 43 y.o. (year old), male patient, seen today for interventional treatment. He  has a past surgical history that includes Back surgery; HAND REIMPLANTED; and Lumbar fusion (11/14). Derek [name] has a current medication list which includes the following prescription(s): calcium  carb-cholecalciferol, celecoxib, cyclobenzaprine , diclofenac sodium, esomeprazole, naloxone , oxycodone  hcl, [START ON 02/12/2024] oxycodone  hcl, [START ON 03/13/2024] oxycodone  hcl, oxycodone  hcl, and tizanidine , and the following Facility-Administered Medications: fentanyl . His primarily concern today is the Back Pain (lower)  Initial Vital Signs:  Pulse/HCG Rate: 88ECG Heart Rate: 90 Temp: 98 F (36.7 C) Resp: 18 BP: 120/79 SpO2: 99 %  BMI: Estimated body mass index is 24.39 kg/m as calculated from the following:   Height as of this encounter: 5' 10 (1.778 m).   Weight as of this encounter: 170 lb (77.1 kg).  Risk Assessment: Allergies: Reviewed. He is allergic to hydrocodone , amoxicillin, and vicodin [hydrocodone -acetaminophen ].  Allergy Precautions: None required Coagulopathies: Reviewed. None identified.  Blood-thinner therapy: None at this time Active Infection(s): Reviewed. None identified. Derek [name] is afebrile  Site Confirmation: Derek [name] was asked to confirm the procedure and laterality before marking the site Procedure checklist: Completed Consent: Before the procedure and under the influence of no sedative(s), amnesic(s), or anxiolytics, the patient was informed of the treatment options, risks and possible complications. To fulfill our ethical and legal obligations, as  recommended by the American Medical Association's Code of Ethics, I have informed the patient of  my clinical impression; the nature and purpose of the treatment or procedure; the risks, benefits, and possible complications of the intervention; the alternatives, including doing nothing; the risk(s) and benefit(s) of the alternative treatment(s) or procedure(s); and the risk(s) and benefit(s) of doing nothing. The patient was provided information about the general risks and possible complications associated with the procedure. These may include, but are not limited to: failure to achieve desired goals, infection, bleeding, organ or nerve damage, allergic reactions, paralysis, and death. In addition, the patient was informed of those risks and complications associated to Spine-related procedures, such as failure to decrease pain; infection (i.e.: Meningitis, epidural or intraspinal abscess); bleeding (i.e.: epidural hematoma, subarachnoid hemorrhage, or any other type of intraspinal or peri-dural bleeding); organ or nerve damage (i.e.: Any type of peripheral nerve, nerve root, or spinal cord injury) with subsequent damage to sensory, motor, and/or autonomic systems, resulting in permanent pain, numbness, and/or weakness of one or several areas of the body; allergic reactions; (i.e.: anaphylactic reaction); and/or death. Furthermore, the patient was informed of those risks and complications associated with the medications. These include, but are not limited to: allergic reactions (i.e.: anaphylactic or anaphylactoid reaction(s)); adrenal axis suppression; blood sugar elevation that in diabetics may result in ketoacidosis or comma; water  retention that in patients with history of congestive heart failure may result in shortness of breath, pulmonary edema, and decompensation with resultant heart failure; weight gain; swelling or edema; medication-induced neural toxicity; particulate matter embolism and blood vessel  occlusion with resultant organ, and/or nervous system infarction; and/or aseptic necrosis of one or more joints. Finally, the patient was informed that Medicine is not an exact science; therefore, there is also the possibility of unforeseen or unpredictable risks and/or possible complications that may result in a catastrophic outcome. The patient indicated having understood very clearly. We have given the patient no guarantees and we have made no promises. Enough time was given to the patient to ask questions, all of which were answered to the patient's satisfaction. Derek [name] has indicated that he wanted to continue with the procedure. Attestation: I, the ordering provider, attest that I have discussed with the patient the benefits, risks, side-effects, alternatives, likelihood of achieving goals, and potential problems during recovery for the procedure that I have provided informed consent. Date  Time: 01/20/2024  8:42 AM  Pre-Procedure Preparation:  Monitoring: As per clinic protocol. Respiration, ETCO2, SpO2, BP, heart rate and rhythm monitor placed and checked for adequate function Safety Precautions: Patient was assessed for positional comfort and pressure points before starting the procedure. Time-out: I initiated and conducted the Time-out before starting the procedure, as per protocol. The patient was asked to participate by confirming the accuracy of the Time Out information. Verification of the correct person, site, and procedure were performed and confirmed by me, the nursing staff, and the patient. Time-out conducted as per Joint Commission's Universal Protocol (UP.01.01.01). Time: 0930 Start Time: 0930 hrs.  Description of Procedure:          Laterality: (see above) Targeted Levels: (see above)  Safety Precautions: Aspiration looking for blood return was conducted prior to all injections. At no point did we inject any substances, as a needle was being advanced. Before injecting,  the patient was told to immediately notify me if he was experiencing any new onset of ringing in the ears, or metallic taste in the mouth. No attempts were made at seeking any paresthesias. Safe injection practices and needle disposal techniques used. Medications properly checked for  expiration dates. SDV (single dose vial) medications used. After the completion of the procedure, all disposable equipment used was discarded in the proper designated medical waste containers. Local Anesthesia: Protocol guidelines were followed. The patient was positioned over the fluoroscopy table. The area was prepped in the usual manner. The time-out was completed. The target area was identified using fluoroscopy. A 12-in long, straight, sterile hemostat was used with fluoroscopic guidance to locate the targets for each level blocked. Once located, the skin was marked with an approved surgical skin marker. Once all sites were marked, the skin (epidermis, dermis, and hypodermis), as well as deeper tissues (fat, connective tissue and muscle) were infiltrated with a small amount of a short-acting local anesthetic, loaded on a 10cc syringe with a 25G, 1.5-in  Needle. An appropriate amount of time was allowed for local anesthetics to take effect before proceeding to the next step. Local Anesthetic: Lidocaine  2.0% The unused portion of the local anesthetic was discarded in the proper designated containers. Technical description of process:  Medial Branch  Dorsal Rami Nerve Block (MBB):  Neuroanatomy note: Each lumbar facet joint receives dual innervation from medial branches arising from the posterior primary rami at the same level and one level above. The target for each lumbar medial branch is the junction of the ipsilateral superior articular and transverse process of the lower vertebral body. (i.e.: The L4-L5 facet joint is innervated by the L4 medial branch [located at L5] and the L3 medial branch [located at L4]. Blocking the  L4 Medial Branch is therefore achieved by injecting at the junction of the ipsilateral superior articular and transverse process of the lower vertebral body [L5].).  Exception: The exception to the above rule is the L5-S1 facet joint which has triple innervation requiring the L4 medial branch, as well as the L5 and the S1 Dorsal Rami(s) to be blocked to fully denervate the joint.  Under fluoroscopic guidance, a needle was inserted until contact was made with os over the target area. After negative aspiration, 0.5 mL of the nerve block solution was injected without difficulty or complication. Paresthesia were avoided during injection. The needle(s) were removed intact and without complication.  Once the entire procedure was completed, the treated area was cleaned, making sure to leave some of the prepping solution back to take advantage of its long term bactericidal properties.         Illustration of the posterior view of the lumbar spine and the posterior neural structures. Laminae of L2 through S1 are labeled. DPRL5, dorsal primary ramus of L5; DPRS1, dorsal primary ramus of S1; DPR3, dorsal primary ramus of L3; FJ, facet (zygapophyseal) joint L3-L4; I, inferior articular process of L4; LB1, lateral branch of dorsal primary ramus of L1; IAB, inferior articular branches from L3 medial branch (supplies L4-L5 facet joint); IBP, intermediate branch plexus; MB3, medial branch of dorsal primary ramus of L3; NR3, third lumbar nerve root; S, superior articular process of L5; SAB, superior articular branches from L4 (supplies L4-5 facet joint also); TP3, transverse process of L3.   Facet Joint Innervation (* possible contribution)  L1-2 T12, L1 (L2*)  Medial Branch  L2-3 L1, L2 (L3*)                     L3-4 L2, L3 (L4*)                     L4-5 L3, L4 (L5*)  L5-S1 L4, L5, S1                        Vitals:   01/20/24 0943 01/20/24 0945 01/20/24 0953 01/20/24 1003  BP:  122/79 (P) 120/80 119/69 128/72  Pulse:    80  Resp: (!) 9 (P) 12 13 15   Temp:      TempSrc:      SpO2: 100% (P) 100% 100% 99%  Weight:      Height:         End Time: 0944 hrs.  Imaging Guidance (Spinal):         Type of Imaging Technique: Fluoroscopy Guidance (Spinal) Indication(s): Fluoroscopy guidance for needle placement to enhance accuracy in procedures requiring precise needle localization for targeted delivery of medication in or near specific anatomical locations not easily accessible without such real-time imaging assistance. Exposure Time: Please see nurses notes. Contrast: None used. Fluoroscopic Guidance: I was personally present during the use of fluoroscopy. Tunnel Vision Technique used to obtain the best possible view of the target area. Parallax error corrected before commencing the procedure. Direction-depth-direction technique used to introduce the needle under continuous pulsed fluoroscopy. Once target was reached, antero-posterior, oblique, and lateral fluoroscopic projection used confirm needle placement in all planes. Images permanently stored in EMR. Interpretation: No contrast injected. I personally interpreted the imaging intraoperatively. Adequate needle placement confirmed in multiple planes. Permanent images saved into the patient's record.  Post-operative Assessment:  Post-procedure Vital Signs:  Pulse/HCG Rate: 8078 Temp: 98 F (36.7 C) Resp: 15 BP: 128/72 SpO2: 99 %  EBL: None  Complications: No immediate post-treatment complications observed by team, or reported by patient.  Note: The patient tolerated the entire procedure well. A repeat set of vitals were taken after the procedure and the patient was kept under observation following institutional policy, for this type of procedure. Post-procedural neurological assessment was performed, showing return to baseline, prior to discharge. The patient was provided with post-procedure discharge  instructions, including a section on how to identify potential problems. Should any problems arise concerning this procedure, the patient was given instructions to immediately contact us , at any time, without hesitation. In any case, we plan to contact the patient by telephone for a follow-up status report regarding this interventional procedure.  Comments:  No additional relevant information.  Plan of Care (POC)  Orders:  Orders Placed This Encounter  Procedures   LUMBAR FACET(MEDIAL BRANCH NERVE BLOCK) MBNB    Scheduling Instructions:     Procedure: Lumbar facet block (AKA.: Lumbosacral medial branch nerve block)     Side: Bilateral     Level: L3-4, L4-5, and L5-S1 Facets (L2, L3, L4, L5, and S1 Medial Branch Nerves)     Sedation: Patient's choice.     Date: 01/20/2024    Where will this procedure be performed?:   [facility]   [facility] C-ARM 1-60 MIN NO REPORT    Intraoperative interpretation by procedural physician at [facility].    Standing Status:   Standing    Number of Occurrences:   1    Reason for exam::   Assistance in needle guidance and placement for procedures requiring needle placement in or near specific anatomical locations not easily accessible without such assistance.   Informed Consent Details: Physician/Practitioner Attestation; Transcribe to consent form and obtain patient signature    Nursing Order: Transcribe to consent form and obtain patient signature. Note: Always confirm laterality of pain with Mr.  [name], before procedure.    Physician/Practitioner attestation of informed consent for procedure/surgical case:   I, the physician/practitioner, attest that I have discussed with the patient the benefits, risks, side effects, alternatives, likelihood of achieving goals and potential problems during recovery for the procedure that I have provided informed consent.    Procedure:   Lumbar Facet Block  under fluoroscopic guidance     Physician/Practitioner performing the procedure:   Dorotha Hirschi A. Tanya MD    Indication/Reason:   Low Back Pain, with our without leg pain, due to Facet Joint Arthralgia (Joint Pain) Spondylosis (Arthritis of the Spine), without myelopathy or radiculopathy (Nerve Damage).   Provide equipment / supplies at bedside    Procedure tray: Block Tray (Disposable  single use) Skin infiltration needle: Regular 1.5-in, 25-G, (x1) Block Needle type: Spinal Amount/quantity: 4 Size: Regular (3.5-inch) Gauge: 22G    Standing Status:   Standing    Number of Occurrences:   1    Specify:   Block Tray   Saline lock IV    Have LR [phone] mL available and administer at 125 mL/hr if patient becomes hypotensive.    Standing Status:   Standing    Number of Occurrences:   1     Opioid Analgesia: Oxycodone  IR 10 mg, 1 tab PO q 6 hrs (40 mg/day of oxycodone ) MME/day: 60 mg/day.    Medications ordered for procedure: Meds ordered this encounter  Medications   lidocaine  (XYLOCAINE ) 2 % (with pres) injection 400 mg   pentafluoroprop-tetrafluoroeth (GEBAUERS) aerosol   midazolam  (VERSED ) 5 MG/5ML injection 0.5-2 mg    Make sure Flumazenil is available in the pyxis when using this medication. If oversedation occurs, administer 0.2 mg IV over 15 sec. If after 45 sec no response, administer 0.2 mg again over 1 min; may repeat at 1 min intervals; not to exceed 4 doses (1 mg)   fentaNYL  (SUBLIMAZE ) injection 25-50 mcg    Make sure Narcan  is available in the pyxis when using this medication. In the event of respiratory depression (RR< 8/min): Titrate NARCAN  (naloxone ) in increments of 0.1 to 0.2 mg IV at 2-3 minute intervals, until desired degree of reversal.   ropivacaine  (PF) 2 mg/mL (0.2%) (NAROPIN ) injection 18 mL   triamcinolone  acetonide (KENALOG -40) injection 80 mg   Medications administered: We administered lidocaine , pentafluoroprop-tetrafluoroeth, midazolam , fentaNYL , ropivacaine  (PF) 2 mg/mL (0.2%),  and triamcinolone  acetonide.  See the medical record for exact dosing, route, and time of administration.    Interventional Therapies  Risk Factors  Considerations:      Lumbar Hardware: No RFA    Planned  Pending:   Therapeutic bilateral lumbar hardware Block/lumbar facet MBB R2L3 (01/20/2024)    Under consideration:   Possible spinal cord stimulator trial    Completed:   Therapeutic right L2-3 LESI x1 (08/29/2020) (0/0/75/> 75)  Diagnostic left lumbar facet MBB x2 (01/15/2022) (100/100/75/75)  Diagnostic right lumbar facet MBB x1 (01/15/2022) (100/100/75/75)  Diagnostic left SI Blk x1 (03/21/2020) (100/100/90 x 1.5 weeks)  Therapeutic/palliative right lateral epicondyle (elbow) inj. x2 (01/21/2021) (0/0/95/95)  Therapeutic/palliative left RACZ procedure x10 (targeting the left S2, S3 area) (10/19/2023) (100/100/75/75)  Therapeutic left RACZ procedure #8 (10/01/2022) (+ saddle block - NO HYPERTONIC injected) (100/100/90/95)    Therapeutic  Palliative (PRN) options:   Palliative right lateral epicondyle (elbow) inj. #2  Palliative left RACZ procedure #8 (targeting the left S2, S3 area)    Pharmacotherapy  Nonopioids transferred 05/02/2020: Zanaflex , Flexeril , and Mobic   Follow-up plan:   Return in about 2 weeks (around 02/03/2024) for (Face2F), (PPE).     Recent Visits Date Type Provider Dept  01/10/24 Office Visit Patel, Seema K, NP [facility]  11/04/23 Office Visit Tanya Glisson, MD [facility]  Showing recent visits within past 90 days and meeting all other requirements Today's Visits Date Type Provider Dept  01/20/24 Procedure visit Tanya Glisson, MD [facility]  Showing today's visits and meeting all other requirements Future Appointments Date Type Provider Dept  02/07/24 Appointment Tanya Glisson, MD [facility]  04/06/24 Appointment Patel, Seema K, NP [facility]  Showing future appointments  within next 90 days and meeting all other requirements   Disposition: Discharge home  Discharge (Date  Time): 01/20/2024; 1006 hrs.   Primary Care Physician: Cyrus Selinda Moose, PA-C Location: [facility] Note by: Glisson DELENA Tanya, MD (TTS technology used. I apologize for any typographical errors that were not detected and corrected.) Date: 01/20/2024; Time: 10:44 AM  Disclaimer:  Medicine is not an Visual merchandiser. The only guarantee in medicine is that nothing is guaranteed. It is important to note that the decision to proceed with this intervention was based on the information collected from the patient. The Data and conclusions were drawn from the patient's questionnaire, the interview, and the physical examination. Because the information was provided in large part by the patient, it cannot be guaranteed that it has not been purposely or unconsciously manipulated. Every effort has been made to obtain as much relevant data as possible for this evaluation. It is important to note that the conclusions that lead to this procedure are derived in large part from the available data. Always take into account that the treatment will also be dependent on availability of resources and existing treatment guidelines, considered by other Pain Management Practitioners as being common knowledge and practice, at the time of the intervention. For Medico-Legal purposes, it is also important to point out that variation in procedural techniques and pharmacological choices are the acceptable norm. The indications, contraindications, technique, and results of the above procedure should only be interpreted and judged by a Board-Certified Interventional Pain Specialist with extensive familiarity and expertise in the same exact procedure and technique.

## 2024-01-21 ENCOUNTER — Telehealth: Payer: Self-pay | Admitting: *Deleted

## 2024-01-21 NOTE — Telephone Encounter (Signed)
 Post procedure call;  no c/o or concerns.

## 2024-01-26 ENCOUNTER — Telehealth: Payer: Self-pay

## 2024-01-26 NOTE — Telephone Encounter (Signed)
 I submitted a prior auth request mistakenly under Dr. Lateef when it should have been Dr. Tanya. They approved the request for lumbar medial branch nerve blocks under Dr. Marcelino. When I caught my mistake I called [employer] to see if I could change the physician but they told me I needed to submit a new request. I submitted it by phone and faxed the exact same notes I used to get the first approval under Dr. Marcelino. This time they denied it due to lack of medical criteria being met. This makes no sense because they approved it when I requested it under Dr. Marcelino. That shara number was [id number] but it was cancelled when I submitted the new request. I called [employer] about the new denied request [id number] to see why it was denied when it was originally approved and I sent the exact same notes. She said it did not meet the criteria and I could send an appeal, which I am doing today. This will be the patients second diagnostic facet medial branch nerve block, hoping to have an RFA in the future.

## 2024-02-06 NOTE — Progress Notes (Unsigned)
 PROVIDER NOTE: Interpretation of information contained herein should be left to medically-trained personnel. Specific patient instructions are provided elsewhere under Patient Instructions section of medical record. This document was created in part using AI and STT-dictation technology, any transcriptional errors that may result from this process are unintentional.  Patient: Derek [name]  Service: E/M   PCP: Cyrus Selinda Moose, PA-C  DOB: 31-Dec-1980  DOS: 02/07/2024  Provider: Eric DELENA Como, MD  MRN: [id number]  Delivery: Face-to-face  Specialty: Interventional Pain Management  Type: Established Patient  Setting: Ambulatory outpatient facility  Specialty designation: 09  Referring Prov.: Cyrus Selinda Moose,*  Location: Outpatient office facility       History of present illness (HPI) Derek [name], a 43 y.o. year old male, is here today because of his Chronic bilateral low back pain without sciatica [M54.50, G89.29]. Derek [name] primary complain today is No chief complaint on file.  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); Somatic dysfunction of sacroiliac joints (Bilateral); Chronic hip pain (Bilateral); Epidural fibrosis; Lumbar postlaminectomy syndrome; DDD (degenerative disc disease), lumbosacral; Lumbar facet syndrome (Bilateral) (L>R); Other specified dorsopathies, sacral and sacrococcygeal region; Spondylosis without myelopathy or radiculopathy, lumbosacral region; Neurogenic pain; Osteoarthritic spondylosis of lumbar spine; Chronic low back pain (Left) w/o sciatica; Numbness of  anterior thigh (Right); Burning pain in thigh (Right); Chronic low back pain (Midline) w/o sciatica; Enthesopathy of elbow region (Bilateral); Sprain of lateral collateral ligament of elbow, sequela (Left); Sprain of lateral collateral ligament of elbow, sequela (Right); Radicular pain of lumbosacral region; Lumbosacral radiculopathy; Chronic low back pain (Bilateral) w/o sciatica; Lumbar facet joint pain; and Pain from implanted hardware on their pertinent problem list.  Pain Assessment: Severity of   is reported as a  /10. Location:    / . Onset:  . Quality:  . Timing:  . Modifying factor(s):  SABRA Vitals:  vitals were not taken for this visit.  BMI: Estimated body mass index is 24.39 kg/m as calculated from the following:   Height as of 01/20/24: 5' 10 (1.778 m).   Weight as of 01/20/24: 170 lb (77.1 kg).  Last encounter: 11/04/2023. Last procedure: 01/20/2024.  Reason for encounter: post-procedure evaluation and assessment.   Discussed the use of AI scribe software for clinical note transcription with the patient, who gave verbal consent to proceed.  History of Present Illness          Post-Procedure Evaluation   Type: Lumbar Fusion (Pedicle screw) Hardware Block (Medial Branch Block)   R2L3  Laterality: Bilateral  Level: L3, L4, L5, and S1 Medial Branch/Dorsal Rami Level(s). Injecting these levels blocks the L4-5 and L5-S1 lumbar facet joints. Imaging: Fluoroscopic guidance Spinal ([id number]) Anesthesia: Local anesthesia (1-2% Lidocaine ) Anxiolysis: IV Versed  2.0 mg Sedation: Moderate Sedation Fentanyl  1 mL (50 mcg) DOS: 01/20/2024 Performed by: Eric DELENA Como, MD  Primary Purpose: Diagnostic/Therapeutic Indications: Low back pain severe enough to impact quality of life or function. 1. Chronic low back pain (Bilateral) w/o sciatica   2. Failed back surgical syndrome (x3)   3. Pain from implanted hardware, sequela   4. Lumbar facet joint pain   5. Lumbar postlaminectomy  syndrome   6. Lumbar facet syndrome (Bilateral) (L>R)  7. Lumbar pseudoarthrosis (L5-S1)   8. Spondylosis without myelopathy or radiculopathy, lumbosacral region    NAS-11 Pain score:   Pre-procedure: 5 /10   Post-procedure: 2  (right radiates to buttock)/10     Effectiveness:  Initial hour after procedure:   ***. Subsequent 4-6 hours post-procedure:   ***. Analgesia past initial 6 hours:   ***. Ongoing improvement:  Analgesic:  *** Function:    ***    ROM:    ***     Pharmacotherapy Assessment   Opioid Analgesia: Oxycodone  IR 10 mg, 1 tab PO q 6 hrs (40 mg/day of oxycodone ) MME/day: 60 mg/day.   Monitoring: [place] PMP: PDMP reviewed during this encounter.       Pharmacotherapy: No side-effects or adverse reactions reported. Compliance: No problems identified. Effectiveness: Clinically acceptable.  No notes on file  UDS:  Summary  Date Value Ref Range Status  01/10/2024 FINAL  Final    Comment:    ==================================================================== [employer] (MW) ==================================================================== Test                             Result       Flag       Units  Drug Present and Declared for Prescription Verification   Oxycodone                       1906         EXPECTED   ng/mg creat   Oxymorphone                    4350         EXPECTED   ng/mg creat   Noroxycodone                   3038         EXPECTED   ng/mg creat   Noroxymorphone                 922          EXPECTED   ng/mg creat    Sources of oxycodone  are scheduled prescription medications.    Oxymorphone, noroxycodone, and noroxymorphone are expected    metabolites of oxycodone . Oxymorphone is also available as a    scheduled prescription medication.  ==================================================================== Test                      Result    Flag   Units      Ref Range   Creatinine              109              mg/dL       >=79 ==================================================================== Declared Medications:  The flagging and interpretation on this report are based on the  following declared medications.  Unexpected results may arise from  inaccuracies in the declared medications.   **Note: The testing scope of this panel includes these medications:   Oxycodone    **Note: The testing scope of this panel does not include the  following reported medications:   Calcium   Celecoxib (Celebrex)  Cholecalciferol  Cyclobenzaprine   Esomeprazole (Nexium)  Naloxone   Tizanidine  ==================================================================== For clinical consultation, please call [phone]. ====================================================================     No results found for: CBDTHCR No results found for: D8THCCBX No results found for: D9THCCBX  ROS  Constitutional: Denies any fever or chills Gastrointestinal: No reported  hemesis, hematochezia, vomiting, or acute GI distress Musculoskeletal: Denies any acute onset joint swelling, redness, loss of ROM, or weakness Neurological: No reported episodes of acute onset apraxia, aphasia, dysarthria, agnosia, amnesia, paralysis, loss of coordination, or loss of consciousness  Medication Review  Calcium  Carb-Cholecalciferol, Diclofenac Sodium, Oxycodone  HCl, celecoxib, cyclobenzaprine , esomeprazole, naloxone , and tiZANidine   History Review  Allergy: Derek [name] is allergic to hydrocodone , amoxicillin, and vicodin [hydrocodone -acetaminophen ]. Drug: Derek [name]  reports no history of drug use. Alcohol:  reports current alcohol use. Tobacco:  reports that he has been smoking cigarettes. He has a 16 pack-year smoking history. He has quit using smokeless tobacco. Social: Derek [name]  reports that he has been smoking cigarettes. He has a 16 pack-year smoking history. He has quit using smokeless tobacco. He reports current alcohol use. He  reports that he does not use drugs. Medical:  has a past medical history of Anxiety, Arthritis, and GERD (gastroesophageal reflux disease). Surgical: Derek [name]  has a past surgical history that includes Back surgery; HAND REIMPLANTED; and Lumbar fusion (11/14). Family: family history is not on file.  Laboratory Chemistry Profile   Renal Lab Results  Component Value Date   BUN 11 01/16/2019   CREATININE 1.08 01/16/2019   BCR 10 01/16/2019   GFRAA 99 01/16/2019   GFRNONAA 86 01/16/2019    Hepatic Lab Results  Component Value Date   AST 24 01/16/2019   ALBUMIN 4.6 01/16/2019   ALKPHOS 108 01/16/2019    Electrolytes Lab Results  Component Value Date   NA 143 01/16/2019   K 4.0 01/16/2019   CL 105 01/16/2019   CALCIUM  10.2 01/16/2019   MG 2.0 01/16/2019    Bone Lab Results  Component Value Date   25OHVITD1 39 01/16/2019   25OHVITD2 1.2 01/16/2019   25OHVITD3 38 01/16/2019    Inflammation (CRP: Acute Phase) (ESR: Chronic Phase) Lab Results  Component Value Date   CRP 4 01/16/2019   ESRSEDRATE 29 (H) 01/16/2019         Note: Above Lab results reviewed.  Recent Imaging Review  DG PAIN CLINIC C-ARM 1-60 MIN NO REPORT Fluoro was used, but no Radiologist interpretation will be provided.  Please refer to NOTES tab for provider progress note. Note: Reviewed        Physical Exam  Vitals: There were no vitals taken for this visit. BMI: Estimated body mass index is 24.39 kg/m as calculated from the following:   Height as of 01/20/24: 5' 10 (1.778 m).   Weight as of 01/20/24: 170 lb (77.1 kg). Ideal: Patient weight not recorded General appearance: Well nourished, well developed, and well hydrated. In no apparent acute distress Mental status: Alert, oriented x 3 (person, place, & time)       Respiratory: No evidence of acute respiratory distress Eyes: PERLA   Assessment   Diagnosis Status  1. Chronic low back pain (Bilateral) w/o sciatica   2. Failed back surgical  syndrome (x3)   3. Pain from implanted hardware, sequela   4. Lumbar facet joint pain   5. Lumbar postlaminectomy syndrome   6. Postop check    Controlled Controlled Controlled   Updated Problems: No problems updated.  Plan of Care  Problem-specific:  Assessment and Plan            Derek [name] has a current medication list which includes the following long-term medication(s): calcium  carb-cholecalciferol, esomeprazole, oxycodone  hcl, [START ON 02/12/2024] oxycodone  hcl, [START ON 03/13/2024] oxycodone  hcl, oxycodone  hcl, and  tizanidine .  Pharmacotherapy (Medications Ordered): No orders of the defined types were placed in this encounter.  Orders:  No orders of the defined types were placed in this encounter.    Interventional Therapies  Risk Factors  Considerations:      Lumbar Hardware: No RFA    Planned  Pending:   Therapeutic bilateral lumbar hardware Block/lumbar facet MBB R2L3 (01/20/2024)    Under consideration:   Possible spinal cord stimulator trial    Completed:   Therapeutic right L2-3 LESI x1 (08/29/2020) (0/0/75/> 75)  Diagnostic left lumbar facet MBB x2 (01/15/2022) (100/100/75/75)  Diagnostic right lumbar facet MBB x1 (01/15/2022) (100/100/75/75)  Diagnostic left SI Blk x1 (03/21/2020) (100/100/90 x 1.5 weeks)  Therapeutic/palliative right lateral epicondyle (elbow) inj. x2 (01/21/2021) (0/0/95/95)  Therapeutic/palliative left RACZ procedure x10 (targeting the left S2, S3 area) (10/19/2023) (100/100/75/75)  Therapeutic left RACZ procedure #8 (10/01/2022) (+ saddle block - NO HYPERTONIC injected) (100/100/90/95)    Therapeutic  Palliative (PRN) options:   Palliative right lateral epicondyle (elbow) inj. #2  Palliative left RACZ procedure #8 (targeting the left S2, S3 area)    Pharmacotherapy  Nonopioids transferred 05/02/2020: Zanaflex , Flexeril , and Mobic       No follow-ups on file.    Recent Visits Date Type Provider Dept  01/20/24  Procedure visit Tanya Glisson, MD [facility]  01/10/24 Office Visit Patel, Seema K, NP [facility]  Showing recent visits within past 90 days and meeting all other requirements Future Appointments Date Type Provider Dept  02/07/24 Appointment Tanya Glisson, MD [facility]  04/06/24 Appointment Patel, Seema K, NP [facility]  Showing future appointments within next 90 days and meeting all other requirements  I discussed the assessment and treatment plan with the patient. The patient was provided an opportunity to ask questions and all were answered. The patient agreed with the plan and demonstrated an understanding of the instructions.  Patient advised to call back or seek an in-person evaluation if the symptoms or condition worsens.  Duration of encounter: *** minutes.  Total time on encounter, as per AMA guidelines included both the face-to-face and non-face-to-face time personally spent by the physician and/or other qualified health care professional(s) on the day of the encounter (includes time in activities that require the physician or other qualified health care professional and does not include time in activities normally performed by clinical staff). Physician's time may include the following activities when performed: Preparing to see the patient (e.g., pre-charting review of records, searching for previously ordered imaging, lab work, and nerve conduction tests) Review of prior analgesic pharmacotherapies. Reviewing PMP Interpreting ordered tests (e.g., lab work, imaging, nerve conduction tests) Performing post-procedure evaluations, including interpretation of diagnostic procedures Obtaining and/or reviewing separately obtained history Performing a medically appropriate examination and/or evaluation Counseling and educating the patient/family/caregiver Ordering medications, tests, or procedures Referring and communicating with  other health care professionals (when not separately reported) Documenting clinical information in the electronic or other health record Independently interpreting results (not separately reported) and communicating results to the patient/ family/caregiver Care coordination (not separately reported)  Note by: Glisson DELENA Tanya, MD (TTS and AI technology used. I apologize for any typographical errors that were not detected and corrected.) Date: 02/07/2024; Time: 8:48 PM

## 2024-02-07 ENCOUNTER — Telehealth: Payer: Self-pay | Admitting: *Deleted

## 2024-02-07 ENCOUNTER — Ambulatory Visit: Attending: Pain Medicine | Admitting: Pain Medicine

## 2024-02-07 DIAGNOSIS — M545 Low back pain, unspecified: Secondary | ICD-10-CM

## 2024-02-07 DIAGNOSIS — M961 Postlaminectomy syndrome, not elsewhere classified: Secondary | ICD-10-CM

## 2024-02-07 DIAGNOSIS — M25551 Pain in right hip: Secondary | ICD-10-CM

## 2024-02-07 DIAGNOSIS — T85848S Pain due to other internal prosthetic devices, implants and grafts, sequela: Secondary | ICD-10-CM

## 2024-02-07 DIAGNOSIS — M25552 Pain in left hip: Secondary | ICD-10-CM | POA: Diagnosis not present

## 2024-02-07 DIAGNOSIS — G8929 Other chronic pain: Secondary | ICD-10-CM

## 2024-02-07 DIAGNOSIS — M5459 Other low back pain: Secondary | ICD-10-CM | POA: Insufficient documentation

## 2024-02-07 DIAGNOSIS — Z09 Encounter for follow-up examination after completed treatment for conditions other than malignant neoplasm: Secondary | ICD-10-CM

## 2024-02-07 DIAGNOSIS — M62830 Muscle spasm of back: Secondary | ICD-10-CM

## 2024-02-07 NOTE — Addendum Note (Signed)
 Addended by: TANYA GLISSON A on: 02/07/2024 02:00 PM   Modules accepted: Orders, Level of Service

## 2024-02-07 NOTE — Progress Notes (Addendum)
 PROVIDER NOTE: Interpretation of information contained herein should be left to medically-trained personnel. Specific patient instructions are provided elsewhere under Patient Instructions section of medical record. This document was created in part using AI and STT-dictation technology, any transcriptional errors that may result from this process are unintentional.  Patient: Derek [name]  Service: E/M   PCP: Derek Selinda Moose, PA-C  DOB: 05/26/1981  DOS: 02/07/2024  Provider: Eric DELENA Como, MD  MRN: [id number]  Delivery: Virtual Visit  Specialty: Interventional Pain Management  Type: Established Patient  Setting: Ambulatory outpatient facility  Specialty designation: 09  Referring Prov.: Derek [name],*  Location: Remote location       Virtual Encounter - Pain Management PROVIDER NOTE: Information contained herein reflects review and annotations entered in association with encounter. Interpretation of such information and data should be left to medically-trained personnel. Information provided to patient can be located elsewhere in the medical record under Patient Instructions. Document created using STT-dictation technology, any transcriptional errors that may result from process are unintentional.    Contact & Pharmacy Preferred: [phone] Home: [phone] (home) Mobile: [phone] (mobile) E-mail: [email or web address]  [facility] - [place], [place] - [address] [place] [address] Phone: [phone] Fax: [phone]   Pre-screening  Derek [name] offered in-person vs virtual encounter. He indicated preferring virtual for this encounter.   Reason COVID-19*  Social distancing based on CDC and AMA recommendations.   I contacted Derek [name] on 02/07/2024 via telephone.      I clearly identified myself as Derek DELENA Como, MD. I verified that I was speaking with the correct person using two identifiers (Name: Derek [name], and date of birth: 1981/02/24).  Consent I sought verbal advanced consent from Derek [name] for virtual visit interactions. I informed Derek [name] of possible security and privacy concerns, risks, and limitations associated with providing not-in-person medical evaluation and management services. I also informed Derek [name] of the availability of in-person appointments. Finally, I informed him that there would be a charge for the virtual visit and that he could be  personally, fully or partially, financially responsible for it. Derek [name] expressed understanding and agreed to proceed.   Historic Elements   Derek [name] is a 43 y.o. year old, male patient evaluated today after our last contact on 01/20/2024. Derek [name]  has a past medical history of Anxiety, Arthritis, and GERD (gastroesophageal reflux disease). He also  has a past surgical history that includes Back surgery; HAND REIMPLANTED; and Lumbar fusion (11/14). Derek [name] has a current medication list which includes the following prescription(s): calcium  carb-cholecalciferol, celecoxib, cyclobenzaprine , diclofenac sodium, esomeprazole, naloxone , oxycodone  hcl, [START ON 02/12/2024] oxycodone  hcl, [START ON 03/13/2024] oxycodone  hcl, oxycodone  hcl, and tizanidine . He  reports that he has been smoking cigarettes. He has a 16 pack-year smoking history. He has quit using smokeless tobacco. He reports current alcohol use. He reports that he does not use drugs. Derek [name] is allergic to hydrocodone , amoxicillin, and vicodin [hydrocodone -acetaminophen ].  BMI: Estimated body mass index is 24.39 kg/m as calculated from the following:   Height as of 01/20/24: 5' 10 (1.778 m).   Weight as of 01/20/24: 170 lb (77.1 kg). Last encounter: 11/04/2023. Last procedure: 01/20/2024.  HPI  Today, he is being contacted for a post-procedure assessment.  Although the patient indicates having attained 90% improvement of the pain in the lower back, he refers that he  still has a little  bit of discomfort around the area of the hips.  Today I had him do a modified Patrick maneuver and he tested positive for bilateral hip arthralgia.  Because his current pain does not seem to be too bad, according to him, we will simply observe.  He was given instructions to give us  a call if it persist at which time we would then order x-rays of the hip and consider the possibility of an intra-articular hip joint injection which we have not yet done for this patient.  The plan was shared with the patient who understood and accepted.  Post-Procedure Evaluation   Type: Lumbar Fusion (Pedicle screw) Hardware Block (Medial Branch Block) R2L3  Laterality: Bilateral  Level: L3, L4, L5, and S1 Medial Branch/Dorsal Rami Level(s). Injecting these levels blocks the L4-5 and L5-S1 lumbar facet joints. Imaging: Fluoroscopic guidance Spinal ([id number]) Anesthesia: Local anesthesia (1-2% Lidocaine ) Anxiolysis: IV Versed  2.0 mg Sedation: Moderate Sedation Fentanyl  1 mL (50 mcg) DOS: 01/20/2024 Performed by: Derek DELENA Como, MD  Primary Purpose: Diagnostic/Therapeutic Indications: Low back pain severe enough to impact quality of life or function. 1. Chronic low back pain (Bilateral) w/o sciatica   2. Failed back surgical syndrome (x3)   3. Pain from implanted hardware, sequela   4. Lumbar facet joint pain   5. Lumbar postlaminectomy syndrome   6. Lumbar facet syndrome (Bilateral) (L>R)   7. Lumbar pseudoarthrosis (L5-S1)   8. Spondylosis without myelopathy or radiculopathy, lumbosacral region    NAS-11 Pain score:   Pre-procedure: 5 /10   Post-procedure: 2 (right radiates to buttock)/10     Effectiveness:  Initial hour after procedure: 90 %. Subsequent 4-6 hours post-procedure: 90 %. Analgesia past initial 6 hours: 90 %. Ongoing improvement:  Analgesic: The patient indicates having attained 90% relief of the pain that started with the local anesthetic and has persisted to an  ongoing 90% improvement of his low back pain. Function: Mr. [name] reports improvement in function ROM: Derek [name] reports improvement in ROM  Pharmacotherapy Assessment  Opioid Analgesia: Oxycodone  IR 10 mg, 1 tab PO q 6 hrs (40 mg/day of oxycodone ) MME/day: 60 mg/day.   Monitoring: [place] PMP: PDMP reviewed during this encounter.       Pharmacotherapy: No side-effects or adverse reactions reported. Compliance: No problems identified. Effectiveness: Clinically acceptable. Plan: Refer to POC.  UDS:  Summary  Date Value Ref Range Status  01/10/2024 FINAL  Final    Comment:    ==================================================================== [employer] Select 13 (MW) ==================================================================== Test                             Result       Flag       Units  Drug Present and Declared for Prescription Verification   Oxycodone                       1906         EXPECTED   ng/mg creat   Oxymorphone                    4350         EXPECTED   ng/mg creat   Noroxycodone                   3038         EXPECTED   ng/mg creat   Noroxymorphone  922          EXPECTED   ng/mg creat    Sources of oxycodone  are scheduled prescription medications.    Oxymorphone, noroxycodone, and noroxymorphone are expected    metabolites of oxycodone . Oxymorphone is also available as a    scheduled prescription medication.  ==================================================================== Test                      Result    Flag   Units      Ref Range   Creatinine              109              mg/dL      >=79 ==================================================================== Declared Medications:  The flagging and interpretation on this report are based on the  following declared medications.  Unexpected results may arise from  inaccuracies in the declared medications.   **Note: The testing scope of this panel includes these medications:    Oxycodone    **Note: The testing scope of this panel does not include the  following reported medications:   Calcium   Celecoxib (Celebrex)  Cholecalciferol  Cyclobenzaprine   Esomeprazole (Nexium)  Naloxone   Tizanidine  ==================================================================== For clinical consultation, please call [phone]. ====================================================================    No results found for: MABLE OYSTER, [id number]  Laboratory Chemistry Profile   Renal Lab Results  Component Value Date   BUN 11 01/16/2019   CREATININE 1.08 01/16/2019   BCR 10 01/16/2019   GFRAA 99 01/16/2019   GFRNONAA 86 01/16/2019    Hepatic Lab Results  Component Value Date   AST 24 01/16/2019   ALBUMIN 4.6 01/16/2019   ALKPHOS 108 01/16/2019    Electrolytes Lab Results  Component Value Date   NA 143 01/16/2019   K 4.0 01/16/2019   CL 105 01/16/2019   CALCIUM  10.2 01/16/2019   MG 2.0 01/16/2019    Bone Lab Results  Component Value Date   25OHVITD1 39 01/16/2019   25OHVITD2 1.2 01/16/2019   25OHVITD3 38 01/16/2019    Inflammation (CRP: Acute Phase) (ESR: Chronic Phase) Lab Results  Component Value Date   CRP 4 01/16/2019   ESRSEDRATE 29 (H) 01/16/2019         Note: Above Lab results reviewed.  Imaging  DG PAIN CLINIC C-ARM 1-60 MIN NO REPORT Fluoro was used, but no Radiologist interpretation will be provided.  Please refer to NOTES tab for provider progress note.  Assessment  The primary encounter diagnosis was Chronic low back pain (Bilateral) w/o sciatica. Diagnoses of Chronic hip pain (Bilateral), Pain from implanted hardware, sequela, Failed back surgical syndrome (x3), Spasm of back muscles, Lumbar facet joint pain, Low back pain of over 3 months duration, Intermittent low back pain, Mechanical low back pain, Intractable low back pain, Multifactorial low back pain, and Recurrent low back pain were also pertinent to this  visit.  Plan of Care  Problem-specific:  No problem-specific Assessment & Plan notes found for this encounter.  Mr. GER RINGENBERG has a current medication list which includes the following long-term medication(s): calcium  carb-cholecalciferol, esomeprazole, oxycodone  hcl, [START ON 02/12/2024] oxycodone  hcl, [START ON 03/13/2024] oxycodone  hcl, oxycodone  hcl, and tizanidine .  Pharmacotherapy (Medications Ordered): No orders of the defined types were placed in this encounter.  Orders:  Orders Placed This Encounter  Procedures   Nursing Instructions:    Please complete this patient's postprocedure evaluation.    Scheduling Instructions:     Please complete this patient's postprocedure  evaluation.   Follow-up plan:   No follow-ups on file.      Interventional Therapies  Risk Factors  Considerations:      Lumbar Hardware: No RFA    Planned  Pending:      Under consideration:   Diagnostic x-rays of both hips  Diagnostic/therapeutic bilateral IA hip joint injection #1  Possible spinal cord stimulator trial    Completed:   Therapeutic right L2-3 LESI x1 (08/29/2020) (0/0/75/> 75)  Diagnostic left lumbar facet MBB x2 (01/15/2022) (100/100/75/75)  Diagnostic right lumbar facet MBB x1 (01/15/2022) (100/100/75/75)  Therapeutic bilateral lumbar hardware Block/lumbar facet MBB R2L3 (01/20/2024) (90/90/90/90) (for LBP) Diagnostic left SI Blk x1 (03/21/2020) (100/100/90 x 1.5 weeks)  Therapeutic/palliative right lateral epicondyle (elbow) inj. x2 (01/21/2021) (0/0/95/95)  Therapeutic/palliative left RACZ procedure x10 (targeting the left S2, S3 area) (10/19/2023) (100/100/75/75) (for LEP)  Therapeutic left RACZ procedure #8 (10/01/2022) (+ saddle block - NO HYPERTONIC injected) (100/100/90/95)    Therapeutic  Palliative (PRN) options:   Palliative right lateral epicondyle (elbow) inj. #2  Palliative left RACZ procedure #8 (targeting the left S2, S3 area)    Pharmacotherapy   Nonopioids transferred 05/02/2020: Zanaflex , Flexeril , and Mobic        Recent Visits Date Type Provider Dept  01/20/24 Procedure visit Tanya Glisson, MD [facility]  01/10/24 Office Visit Patel, Seema K, NP [facility]  Showing recent visits within past 90 days and meeting all other requirements Today's Visits Date Type Provider Dept  02/07/24 Office Visit Tanya Glisson, MD [facility]  Showing today's visits and meeting all other requirements Future Appointments Date Type Provider Dept  04/06/24 Appointment Patel, Seema K, NP [facility]  Showing future appointments within next 90 days and meeting all other requirements  I discussed the assessment and treatment plan with the patient. The patient was provided an opportunity to ask questions and all were answered. The patient agreed with the plan and demonstrated an understanding of the instructions.  Patient advised to call back or seek an in-person evaluation if the symptoms or condition worsens.  Duration of encounter: 12 minutes.  Note by: Glisson DELENA Tanya, MD Date: 02/07/2024; Time: 2:00 PM

## 2024-03-17 ENCOUNTER — Inpatient Hospital Stay

## 2024-03-17 ENCOUNTER — Inpatient Hospital Stay: Attending: Oncology | Admitting: Oncology

## 2024-03-17 ENCOUNTER — Encounter: Payer: Self-pay | Admitting: Oncology

## 2024-03-17 VITALS — BP 119/80 | HR 96 | Temp 97.8°F | Resp 18 | Wt 165.6 lb

## 2024-03-17 DIAGNOSIS — F17210 Nicotine dependence, cigarettes, uncomplicated: Secondary | ICD-10-CM | POA: Diagnosis not present

## 2024-03-17 DIAGNOSIS — D75839 Thrombocytosis, unspecified: Secondary | ICD-10-CM | POA: Diagnosis not present

## 2024-03-17 DIAGNOSIS — Z720 Tobacco use: Secondary | ICD-10-CM

## 2024-03-17 DIAGNOSIS — D72829 Elevated white blood cell count, unspecified: Secondary | ICD-10-CM

## 2024-03-17 LAB — HIV ANTIBODY (ROUTINE TESTING W REFLEX): HIV Screen 4th Generation wRfx: NONREACTIVE

## 2024-03-17 LAB — CMP (CANCER CENTER ONLY)
ALT: 20 U/L (ref 0–44)
AST: 28 U/L (ref 15–41)
Albumin: 3.7 g/dL (ref 3.5–5.0)
Alkaline Phosphatase: 86 U/L (ref 38–126)
Anion gap: 6 (ref 5–15)
BUN: 14 mg/dL (ref 6–20)
CO2: 20 mmol/L — ABNORMAL LOW (ref 22–32)
Calcium: 8.9 mg/dL (ref 8.9–10.3)
Chloride: 107 mmol/L (ref 98–111)
Creatinine: 0.92 mg/dL (ref 0.61–1.24)
GFR, Estimated: >60 mL/min (ref >60)
Glucose, Bld: 138 mg/dL — ABNORMAL HIGH (ref 70–99)
Potassium: 3.7 mmol/L (ref 3.5–5.1)
Sodium: 133 mmol/L — ABNORMAL LOW (ref 135–145)
Total Bilirubin: 0.4 mg/dL (ref 0.0–1.2)
Total Protein: 7.5 g/dL (ref 6.5–8.1)

## 2024-03-17 LAB — CBC WITH DIFFERENTIAL/PLATELET
Abs Immature Granulocytes: 0.22 K/uL — ABNORMAL HIGH (ref 0.00–0.07)
Basophils Absolute: 0.1 K/uL (ref 0.0–0.1)
Basophils Relative: 0 %
Eosinophils Absolute: 0.2 K/uL (ref 0.0–0.5)
Eosinophils Relative: 1 %
HCT: 43.1 % (ref 39.0–52.0)
Hemoglobin: 13.4 g/dL (ref 13.0–17.0)
Immature Granulocytes: 1 %
Lymphocytes Relative: 11 %
Lymphs Abs: 2.1 K/uL (ref 0.7–4.0)
MCH: 25.2 pg — ABNORMAL LOW (ref 26.0–34.0)
MCHC: 31.1 g/dL (ref 30.0–36.0)
MCV: 81.2 fL (ref 80.0–100.0)
Monocytes Absolute: 0.9 K/uL (ref 0.1–1.0)
Monocytes Relative: 5 %
Neutro Abs: 16.4 K/uL — ABNORMAL HIGH (ref 1.7–7.7)
Neutrophils Relative %: 82 %
Platelets: 403 K/uL — ABNORMAL HIGH (ref 150–400)
RBC: 5.31 MIL/uL (ref 4.22–5.81)
RDW: 15.3 % (ref 11.5–15.5)
WBC: 19.8 K/uL — ABNORMAL HIGH (ref 4.0–10.5)
nRBC: 0 % (ref 0.0–0.2)

## 2024-03-17 LAB — HEPATITIS PANEL, ACUTE
HCV Ab: NONREACTIVE
Hep A IgM: NONREACTIVE
Hep B C IgM: NONREACTIVE
Hepatitis B Surface Ag: NONREACTIVE

## 2024-03-17 LAB — LACTATE DEHYDROGENASE: LDH: 152 U/L (ref 98–192)

## 2024-03-17 NOTE — Progress Notes (Signed)
 Hematology/Oncology Consult note [phone] [phone]        REFERRING PROVIDER: Cyrus Mayo Hestle,*   CHIEF COMPLAINTS/REASON FOR VISIT:  Evaluation of leukocytosis   ASSESSMENT & PLAN:   Leukocytosis Labs reviewed and discussed with patient that Leukocytosis, differential diagnosis is broad, acute or chronic infection and inflammation, smoking, autoimmune disease, or underlying bone marrow disorders.   For the work up of patient's leukocytosis, I recommend checking CBC;CMP, LDH, smear review, peripheral flowcytometry, hepatitis, HIV, EBV monoclonal gammopathy workup.    Tobacco use Recommend smoke cessation.   Thrombocytosis Likely reactive  Check BCR ABL1 and Jak2    Orders Placed This Encounter  Procedures   CBC with Differential/Platelet    Standing Status:   Future    Number of Occurrences:   1    Expected Date:   03/17/2024    Expiration Date:   06/15/2024   Multiple Myeloma Panel (SPEP&IFE w/QIG)    Standing Status:   Future    Number of Occurrences:   1    Expected Date:   03/17/2024    Expiration Date:   06/15/2024   Kappa/lambda light chains    Standing Status:   Future    Number of Occurrences:   1    Expected Date:   03/17/2024    Expiration Date:   06/15/2024   Flow cytometry panel-leukemia/lymphoma work-up    Standing Status:   Future    Number of Occurrences:   1    Expected Date:   03/17/2024    Expiration Date:   06/15/2024   Lactate dehydrogenase    Standing Status:   Future    Number of Occurrences:   1    Expected Date:   03/17/2024    Expiration Date:   06/15/2024   HIV Antibody (routine testing w rflx)    Standing Status:   Future    Number of Occurrences:   1    Expected Date:   03/17/2024    Expiration Date:   06/15/2024   Hepatitis panel, acute    Standing Status:   Future    Number of Occurrences:   1    Expected Date:   03/17/2024    Expiration Date:   06/15/2024   JAK2 V617F rfx CALR/MPL/E12-15     Standing Status:   Future    Number of Occurrences:   1    Expected Date:   03/17/2024    Expiration Date:   06/15/2024   BCR-ABL1 FISH    Standing Status:   Future    Number of Occurrences:   1    Expected Date:   03/17/2024    Expiration Date:   06/15/2024   CMP (Cancer Center only)    Standing Status:   Future    Number of Occurrences:   1    Expected Date:   03/17/2024    Expiration Date:   03/17/2025   Elbert barr vrs(ebv dna by pcr)    Standing Status:   Future    Number of Occurrences:   1    Expected Date:   03/17/2024    Expiration Date:   03/17/2025   Follow up in a few weeks to discuss results.  All questions were answered. The patient knows to call the clinic with any problems, questions or concerns.  Zelphia Cap, MD, PhD [facility] Hematology Oncology 03/17/2024   HISTORY OF PRESENTING ILLNESS:   Derek [name] is a  43 y.o.  male  with PMH listed below was seen in consultation at the request of  Cyrus Selinda Moose,*  for evaluation of leukocytosis   Discussed the use of AI scribe software for clinical note transcription with the patient, who gave verbal consent to proceed.   I reviewed his previous lab records.  His white blood cell count has been elevated since 2020, The most recent blood work from February 15, 2024, was part of a routine physical exam. Total wbc 18.7, neutrophilia, basophilia.   He has a 30-year history of smoking, currently smoking about a pack a day, and is in the process of quitting with the help of Chantix. He does not consume alcohol.  He experiences chronic back inflammation and receives steroid injections three to four times a year, with the last injection occurring in mid-June 2025. He has hardware in his back and a history of sinus issues, including a recent episode of gum spots releasing pus, possibly related to dental extractions.  He denies any non-healing wounds or prosthetic joints. He has a history of anxiety, arthritis, GERD, and  acid reflux, for which he takes Celecoxib, Flexeril , Nexium, and oxycodone .  Denies weight loss, fever, chills, fatigue, night sweats.    MEDICAL HISTORY:  Past Medical History:  Diagnosis Date   Anxiety    IN THE PAST   Arthritis    GERD (gastroesophageal reflux disease)    TAKES OTC    SURGICAL HISTORY: Past Surgical History:  Procedure Laterality Date   BACK SURGERY     LUMBAR LAMINECTOMY   HAND REIMPLANTED     LEFT HAND 1985   LUMBAR FUSION  11/14    SOCIAL HISTORY: Social History   Socioeconomic History   Marital status: Divorced    Spouse name: Not on file   Number of children: Not on file   Years of education: Not on file   Highest education level: Not on file  Occupational History   Not on file  Tobacco Use   Smoking status: Every Day    Current packs/day: 1.00    Average packs/day: 1 pack/day for 30.0 years (30.0 ttl pk-yrs)    Types: Cigarettes   Smokeless tobacco: Former  Substance and Sexual Activity   Alcohol use: Yes    Comment: VERY RARE, SOCIAL   Drug use: No   Sexual activity: Not on file  Other Topics Concern   Not on file  Social History Narrative   Not on file   Social Drivers of Health   Financial Resource Strain: Low Risk  (02/27/2024)   Received from [facility]   Overall Financial Resource Strain (CARDIA)    Difficulty of Paying Living Expenses: Not hard at all  Food Insecurity: No Food Insecurity (02/27/2024)   Received from [facility]   Hunger Vital Sign    Within the past 12 months, you worried that your food would run out before you got the money to buy more.: Never true    Within the past 12 months, the food you bought just didn't last and you didn't have money to get more.: Never true  Transportation Needs: No Transportation Needs (02/27/2024)   Received from [facility] - Transportation    In the past 12 months, has lack of transportation kept you from medical  appointments or from getting medications?: No    Lack of Transportation (Non-Medical): No  Physical Activity: Not on file  Stress: Not on file  Social Connections: Not  on file  Intimate Partner Violence: Not on file    FAMILY HISTORY: History reviewed. No pertinent family history.  ALLERGIES:  is allergic to hydrocodone , amoxicillin, and vicodin [hydrocodone -acetaminophen ].  MEDICATIONS:  Current Outpatient Medications  Medication Sig Dispense Refill   Calcium  Carb-Cholecalciferol 600-10 MG-MCG TABS Take 2 tablets by mouth every morning.     celecoxib (CELEBREX) 100 MG capsule Take 100 mg by mouth 2 (two) times daily.     cyclobenzaprine  (FLEXERIL ) 10 MG tablet Take 10 mg by mouth 3 (three) times daily as needed for muscle spasms.     Diclofenac Sodium (PENNSAID TD) Place onto the skin as needed.     esomeprazole (NEXIUM) 20 MG capsule Take 20 mg by mouth daily at 12 noon.     naloxone  (NARCAN ) nasal spray 4 mg/0.1 mL Place 1 spray into the nose as needed for up to 365 doses (for opioid-induced respiratory depresssion). In case of emergency (overdose), spray once into each nostril. If no response within 3 minutes, repeat application and call 911. 1 each 1   Oxycodone  HCl 10 MG TABS Take 1 tablet (10 mg total) by mouth every 6 (six) hours as needed. Must last 30 days 120 tablet 0   Oxycodone  HCl 10 MG TABS Take 1 tablet (10 mg total) by mouth every 6 (six) hours as needed. Must last 30 days 120 tablet 0   Oxycodone  HCl 10 MG TABS Take 1 tablet (10 mg total) by mouth every 6 (six) hours as needed. Must last 30 days 120 tablet 0   Oxycodone  HCl 10 MG TABS Take 1 tablet (10 mg total) by mouth every 6 (six) hours as needed. Must last 30 days 120 tablet 0   tiZANidine  (ZANAFLEX ) 4 MG tablet Take 1 tablet (4 mg total) by mouth 2 (two) times daily as needed for muscle spasms (For day-time muscle pain/spasm). 60 tablet 5   No current facility-administered medications for this visit.    Review  of Systems  Constitutional:  Negative for appetite change, chills, fatigue, fever and unexpected weight change.  HENT:   Negative for hearing loss and voice change.   Eyes:  Negative for eye problems and icterus.  Respiratory:  Negative for chest tightness, cough and shortness of breath.   Cardiovascular:  Negative for chest pain and leg swelling.  Gastrointestinal:  Negative for abdominal distention and abdominal pain.  Endocrine: Negative for hot flashes.  Genitourinary:  Negative for difficulty urinating, dysuria and frequency.   Musculoskeletal:  Negative for arthralgias.  Skin:  Negative for itching and rash.  Neurological:  Negative for light-headedness and numbness.  Hematological:  Negative for adenopathy. Does not bruise/bleed easily.  Psychiatric/Behavioral:  Negative for confusion.    PHYSICAL EXAMINATION:  Vitals:   03/17/24 0949  BP: 119/80  Pulse: 96  Resp: 18  Temp: 97.8 F (36.6 C)  SpO2: 99%   Filed Weights   03/17/24 0949  Weight: 165 lb 9.6 oz (75.1 kg)    Physical Exam Constitutional:      General: He is not in acute distress. HENT:     Head: Normocephalic and atraumatic.  Eyes:     General: No scleral icterus. Cardiovascular:     Rate and Rhythm: Normal rate and regular rhythm.     Heart sounds: Normal heart sounds.  Pulmonary:     Effort: Pulmonary effort is normal. No respiratory distress.     Breath sounds: No wheezing.  Abdominal:     General: Bowel sounds are normal.  There is no distension.     Palpations: Abdomen is soft.  Musculoskeletal:        General: No deformity. Normal range of motion.     Cervical back: Normal range of motion and neck supple.  Skin:    General: Skin is warm and dry.     Findings: No erythema or rash.     Comments: Multiple skin tattoos  Neurological:     Mental Status: He is alert and oriented to person, place, and time. Mental status is at baseline.     Cranial Nerves: No cranial nerve deficit.  Psychiatric:         Mood and Affect: Mood normal.     LABORATORY DATA:  I have reviewed the data as listed    Latest Ref Rng & Units 03/17/2024   10:17 AM 02/21/2015    3:22 AM 02/12/2015    8:52 AM  CBC  WBC 4.0 - 10.5 K/uL 19.8  27.5  9.0   Hemoglobin 13.0 - 17.0 g/dL 86.5  86.8  85.9   Hematocrit 39.0 - 52.0 % 43.1  39.9  42.3   Platelets 150 - 400 K/uL 403  248  228       Latest Ref Rng & Units 03/17/2024   10:18 AM 01/16/2019    2:48 PM 01/24/2018    9:15 AM  CMP  Glucose 70 - 99 mg/dL 861  895  89   BUN 6 - 20 mg/dL 14  11  8    Creatinine 0.61 - 1.24 mg/dL 9.07  8.91  8.97   Sodium 135 - 145 mmol/L 133  143  144   Potassium 3.5 - 5.1 mmol/L 3.7  4.0  4.1   Chloride 98 - 111 mmol/L 107  105  104   CO2 22 - 32 mmol/L 20     Calcium  8.9 - 10.3 mg/dL 8.9  89.7  9.9   Total Protein 6.5 - 8.1 g/dL 7.5  7.0  7.2   Total Bilirubin 0.0 - 1.2 mg/dL 0.4  0.3  0.3   Alkaline Phos 38 - 126 U/L 86  108  101   AST 15 - 41 U/L 28  24  22    ALT 0 - 44 U/L 20         RADIOGRAPHIC STUDIES: I have personally reviewed the radiological images as listed and agreed with the findings in the report. DG PAIN CLINIC C-ARM 1-60 MIN NO REPORT Result Date: 01/20/2024 Fluoro was used, but no Radiologist interpretation will be provided. Please refer to NOTES tab for provider progress note.

## 2024-03-17 NOTE — Assessment & Plan Note (Signed)
 Labs reviewed and discussed with patient that Leukocytosis, differential diagnosis is broad, acute or chronic infection and inflammation, smoking, autoimmune disease, or underlying bone marrow disorders.   For the work up of patient's leukocytosis, I recommend checking CBC;CMP, LDH, smear review, peripheral flowcytometry, hepatitis, HIV, EBV monoclonal gammopathy workup.

## 2024-03-17 NOTE — Assessment & Plan Note (Signed)
 Recommend smoke cessation.

## 2024-03-17 NOTE — Assessment & Plan Note (Signed)
 Likely reactive  Check BCR ABL1 and Jak2

## 2024-03-18 LAB — EPSTEIN BARR VRS(EBV DNA BY PCR): EBV DNA QN by PCR: NEGATIVE [IU]/mL

## 2024-03-20 LAB — KAPPA/LAMBDA LIGHT CHAINS
Kappa free light chain: 37.6 mg/L — ABNORMAL HIGH (ref 3.3–19.4)
Kappa, lambda light chain ratio: 1.69 — ABNORMAL HIGH (ref 0.26–1.65)
Lambda free light chains: 22.3 mg/L (ref 5.7–26.3)

## 2024-03-21 LAB — COMP PANEL: LEUKEMIA/LYMPHOMA

## 2024-03-21 LAB — MULTIPLE MYELOMA PANEL, SERUM
Albumin SerPl Elph-Mcnc: 3.3 g/dL (ref 2.9–4.4)
Albumin/Glob SerPl: 0.9 (ref 0.7–1.7)
Alpha 1: 0.3 g/dL (ref 0.0–0.4)
Alpha2 Glob SerPl Elph-Mcnc: 1 g/dL (ref 0.4–1.0)
B-Globulin SerPl Elph-Mcnc: 1.2 g/dL (ref 0.7–1.3)
Gamma Glob SerPl Elph-Mcnc: 1.1 g/dL (ref 0.4–1.8)
Globulin, Total: 3.7 g/dL (ref 2.2–3.9)
IgA: 450 mg/dL — ABNORMAL HIGH (ref 90–386)
IgG (Immunoglobin G), Serum: 1040 mg/dL (ref 603–1613)
IgM (Immunoglobulin M), Srm: 163 mg/dL (ref 20–172)
Total Protein ELP: 7 g/dL (ref 6.0–8.5)

## 2024-03-25 LAB — CALR +MPL + E12-E15  (REFLEX)

## 2024-03-25 LAB — JAK2 V617F RFX CALR/MPL/E12-15

## 2024-04-06 ENCOUNTER — Ambulatory Visit: Attending: Nurse Practitioner | Admitting: Nurse Practitioner

## 2024-04-06 ENCOUNTER — Encounter: Payer: Self-pay | Admitting: Nurse Practitioner

## 2024-04-06 DIAGNOSIS — M961 Postlaminectomy syndrome, not elsewhere classified: Secondary | ICD-10-CM | POA: Insufficient documentation

## 2024-04-06 DIAGNOSIS — M5441 Lumbago with sciatica, right side: Secondary | ICD-10-CM | POA: Insufficient documentation

## 2024-04-06 DIAGNOSIS — G894 Chronic pain syndrome: Secondary | ICD-10-CM | POA: Insufficient documentation

## 2024-04-06 DIAGNOSIS — Z79891 Long term (current) use of opiate analgesic: Secondary | ICD-10-CM | POA: Diagnosis present

## 2024-04-06 DIAGNOSIS — M960 Pseudarthrosis after fusion or arthrodesis: Secondary | ICD-10-CM

## 2024-04-06 DIAGNOSIS — G8929 Other chronic pain: Secondary | ICD-10-CM | POA: Insufficient documentation

## 2024-04-06 DIAGNOSIS — G96198 Other disorders of meninges, not elsewhere classified: Secondary | ICD-10-CM

## 2024-04-06 DIAGNOSIS — M5442 Lumbago with sciatica, left side: Secondary | ICD-10-CM | POA: Insufficient documentation

## 2024-04-06 DIAGNOSIS — S32009K Unspecified fracture of unspecified lumbar vertebra, subsequent encounter for fracture with nonunion: Secondary | ICD-10-CM | POA: Diagnosis present

## 2024-04-06 DIAGNOSIS — R52 Pain, unspecified: Secondary | ICD-10-CM

## 2024-04-06 DIAGNOSIS — Z79899 Other long term (current) drug therapy: Secondary | ICD-10-CM

## 2024-04-06 DIAGNOSIS — M5417 Radiculopathy, lumbosacral region: Secondary | ICD-10-CM | POA: Insufficient documentation

## 2024-04-06 DIAGNOSIS — R200 Anesthesia of skin: Secondary | ICD-10-CM

## 2024-04-06 MED ORDER — OXYCODONE HCL 10 MG PO TABS: 10.0000 mg | ORAL_TABLET | Freq: Four times a day (QID) | ORAL | 0 refills | Status: DC | PRN

## 2024-04-06 NOTE — Progress Notes (Signed)
 Nursing Pain Medication Assessment:  Safety precautions to be maintained throughout the outpatient stay will include: orient to surroundings, keep bed in low position, maintain call bell within reach at all times, provide assistance with transfer out of bed and ambulation.  Medication Inspection Compliance: Pill count conducted under aseptic conditions, in front of the patient. Neither the pills nor the bottle was removed from the patient's sight at any time. Once count was completed pills were immediately returned to the patient in their original bottle.  Medication: Oxycodone  IR Pill/Patch Count: 13 of 120 pills/patches remain Pill/Patch Appearance: Markings consistent with prescribed medication Bottle Appearance: Standard pharmacy container. Clearly labeled. Filled Date: 09 / 08 / 2025 Last Medication intake:  Today

## 2024-04-06 NOTE — Progress Notes (Signed)
 PROVIDER NOTE: Interpretation of information contained herein should be left to medically-trained personnel. Specific patient instructions are provided elsewhere under Patient Instructions section of medical record. This document was created in part using AI and STT-dictation technology, any transcriptional errors that may result from this process are unintentional.  Patient: Derek [name]  Service: E/M   PCP: Cyrus Selinda Moose, PA-C  DOB: 06/24/1981  DOS: 04/06/2024  Provider: Emmy MARLA Blanch, NP  MRN: [id number]  Delivery: Face-to-face  Specialty: Interventional Pain Management  Type: Established Patient  Setting: Ambulatory outpatient facility  Specialty designation: 09  Referring Prov.: Cyrus Selinda Moose,*  Location: Outpatient office facility       History of present illness (HPI) Derek [name], a 43 y.o. year old male, is here today because of his Low back pain. Derek [name] primary complain today is Back Pain (Lower Back )  Pertinent problems: Derek [name] has  Lumbar pseudoarthrosis (L5-S1); Chronic low back pain (1ry area of pain) (Bilateral) (L>R) w/sciatica (Bilateral); Chronic lower extremity pain (2ry area of pain) (Bilateral) (L>R); Chronic pain syndrome; Failed back surgical syndrome (x3); Chronic musculoskeletal pain; Spasm of back muscles; Sacroiliac joint dysfunction (bilateral) and Neurogenic pain on their pertinent problem list.  Pain Assessment: Severity of Chronic pain is reported as a 3 /10. Location: Back Lower/Denies. Onset: More than a month ago. Quality: Constant, Sharp. Timing: Constant. Modifying factor(s): Denies. Vitals:  height is 5' 10 (1.778 m) and weight is 171 lb (77.6 kg). His temporal temperature is 97.1 F (36.2 C) (abnormal). His blood pressure is 136/82 and his pulse is 89. His respiration is 20 and oxygen saturation is 100%.  BMI: Estimated body mass index is 24.54 kg/m as calculated from the following:   Height as of this encounter: 5' 10 (1.778  m).   Weight as of this encounter: 171 lb (77.6 kg).  Last encounter: 01/10/2024. Last procedure: Visit date not found.  Reason for encounter: medication management.  The patient indicates doing well with current medication regimen.  No adverse reaction or side effects reported to medication.  The patient is experiencing bilateral lower back pain, likely due to lumbar facet arthropathy.  However, he is currently not in significant pain and his symptoms are well-managed with prescribed oxycodone . Pharmacotherapy Assessment   Oxycodone  HCl 10 mg tablets every 6 hours as needed for pain. MME=60 Monitoring: [place] PMP: PDMP reviewed during this encounter.       Pharmacotherapy: No side-effects or adverse reactions reported. Compliance: No problems identified. Effectiveness: Clinically acceptable.  Erlene Doyal [place]UN[place], [place]  04/06/2024  8:33 AM  Sign when Signing Visit Nursing Pain Medication Assessment:  Safety precautions to be maintained throughout the outpatient stay will include: orient to surroundings, keep bed in low position, maintain call bell within reach at all times, provide assistance with transfer out of bed and ambulation.  Medication Inspection Compliance: Pill count conducted under aseptic conditions, in front of the patient. Neither the pills nor the bottle was removed from the patient's sight at any time. Once count was completed pills were immediately returned to the patient in their original bottle.  Medication: Oxycodone  IR Pill/Patch Count: 13 of 120 pills/patches remain Pill/Patch Appearance: Markings consistent with prescribed medication Bottle Appearance: Standard pharmacy container. Clearly labeled. Filled Date: 09 / 08 / 2025 Last Medication intake:  Today    UDS:  Summary  Date Value Ref Range Status  01/10/2024 FINAL  Final    Comment:    ==================================================================== [employer] [facility]  (  MW) ==================================================================== Test                             Result       Flag       Units  Drug Present and Declared for Prescription Verification   Oxycodone                       1906         EXPECTED   ng/mg creat   Oxymorphone                    4350         EXPECTED   ng/mg creat   Noroxycodone                   3038         EXPECTED   ng/mg creat   Noroxymorphone                 922          EXPECTED   ng/mg creat    Sources of oxycodone  are scheduled prescription medications.    Oxymorphone, noroxycodone, and noroxymorphone are expected    metabolites of oxycodone . Oxymorphone is also available as a    scheduled prescription medication.  ==================================================================== Test                      Result    Flag   Units      Ref Range   Creatinine              109              mg/dL      >=79 ==================================================================== Declared Medications:  The flagging and interpretation on this report are based on the  following declared medications.  Unexpected results may arise from  inaccuracies in the declared medications.   **Note: The testing scope of this panel includes these medications:   Oxycodone    **Note: The testing scope of this panel does not include the  following reported medications:   Calcium   Celecoxib (Celebrex)  Cholecalciferol  Cyclobenzaprine   Esomeprazole (Nexium)  Naloxone   Tizanidine  ==================================================================== For clinical consultation, please call [phone]. ====================================================================     No results found for: CBDTHCR No results found for: D8THCCBX No results found for: D9THCCBX  ROS  Constitutional: Denies any fever or chills Gastrointestinal: No reported hemesis, hematochezia, vomiting, or acute GI distress Musculoskeletal: Lower  back pain Neurological: No reported episodes of acute onset apraxia, aphasia, dysarthria, agnosia, amnesia, paralysis, loss of coordination, or loss of consciousness  Medication Review  Calcium  Carb-Cholecalciferol, Diclofenac Sodium, Oxycodone  HCl, celecoxib, cyclobenzaprine , esomeprazole, hydrOXYzine, naloxone , and tiZANidine   History Review  Allergy: Derek [name] is allergic to hydrocodone , amoxicillin, and vicodin [hydrocodone -acetaminophen ]. Drug: Derek [name]  reports no history of drug use. Alcohol:  reports current alcohol use. Tobacco:  reports that he has been smoking cigarettes. He has a 30 pack-year smoking history. He has quit using smokeless tobacco. Social: Derek [name]  reports that he has been smoking cigarettes. He has a 30 pack-year smoking history. He has quit using smokeless tobacco. He reports current alcohol use. He reports that he does not use drugs. Medical:  has a past medical history of Anxiety, Arthritis, and GERD (gastroesophageal reflux disease). Surgical: Derek [name]  has a past surgical history that includes Back surgery; HAND REIMPLANTED; and  Lumbar fusion (11/14). Family: family history is not on file.  Laboratory Chemistry Profile   Renal Lab Results  Component Value Date   BUN 14 03/17/2024   CREATININE 0.92 03/17/2024   BCR 10 01/16/2019   GFRAA 99 01/16/2019   GFRNONAA >60 03/17/2024    Hepatic Lab Results  Component Value Date   AST 28 03/17/2024   ALT 20 03/17/2024   ALBUMIN 3.7 03/17/2024   ALKPHOS 86 03/17/2024   HCVAB NON REACTIVE 03/17/2024    Electrolytes Lab Results  Component Value Date   NA 133 (L) 03/17/2024   K 3.7 03/17/2024   CL 107 03/17/2024   CALCIUM  8.9 03/17/2024   MG 2.0 01/16/2019    Bone Lab Results  Component Value Date   25OHVITD1 39 01/16/2019   25OHVITD2 1.2 01/16/2019   25OHVITD3 38 01/16/2019    Inflammation (CRP: Acute Phase) (ESR: Chronic Phase) Lab Results  Component Value Date   CRP 4 01/16/2019    ESRSEDRATE 29 (H) 01/16/2019         Note: Above Lab results reviewed.  Recent Imaging Review  DG PAIN CLINIC C-ARM 1-60 MIN NO REPORT Fluoro was used, but no Radiologist interpretation will be provided.  Please refer to NOTES tab for provider progress note. Note: Reviewed        Physical Exam  Vitals: BP 136/82 (BP Location: Right Arm, Patient Position: Sitting, Cuff Size: Normal)   Pulse 89   Temp (!) 97.1 F (36.2 C) (Temporal)   Resp 20   Ht 5' 10 (1.778 m)   Wt 171 lb (77.6 kg)   SpO2 100%   BMI 24.54 kg/m  BMI: Estimated body mass index is 24.54 kg/m as calculated from the following:   Height as of this encounter: 5' 10 (1.778 m).   Weight as of this encounter: 171 lb (77.6 kg). Ideal: Ideal body weight: 73 kg (160 lb 15 oz) Adjusted ideal body weight: 74.8 kg (164 lb 15.4 oz) General appearance: Well nourished, well developed, and well hydrated. In no apparent acute distress Mental status: Alert, oriented x 3 (person, place, & time)       Respiratory: No evidence of acute respiratory distress Eyes: PERLA  Musculoskeletal: Lower back pain (more at L5-S1) Assessment   Diagnosis Status  1. Lumbar pseudoarthrosis (L5-S1)   2. Chronic use of opiate for therapeutic purpose   3. Chronic pain syndrome   4. Failed back surgical syndrome (x3)   5. Chronic low back pain (1ry area of Pain) (Bilateral) (L>R) w/ sciatica (Bilateral)   6. Radicular pain of lumbosacral region   7. Lumbar postlaminectomy syndrome    Controlled Controlled Controlled   Updated Problems: No problems updated.  Plan of Care  Problem-specific:  Assessment and Plan Lumbar pseudoarthrosis (L5-S1): The patient has a history of lumbar spine surgery for chronic lower back pain and continues to experience pain localized at L5-S1.  He also has a lumbar pseudoarthrosis, a condition in which the bone does not properly heal and fusion to a single mass after surgery, resulting in continued motion at  the fusion site and persistent pain.  However, his symptoms are well-controlled with his current pain medication regimen.  Chronic pain syndrome: Patient's pain is controlled with oxycodone , will continue on current medication regimen.  Prescribing drug monitoring (PMP) reviewed; findings consistent with the use of prescribed medication and no evidence of narcotic misuse or abuse.  Urine drug screening (UDS) up-to-date.  Schedule follow-up in 90 days for medication  management.  Chronic use of opioid for therapeutic purpose: Will continue on current medication regimen as his symptoms are well-controlled with oxycodone  with the pain level 3/10.    Derek [name] has a current medication list which includes the following long-term medication(s): calcium  carb-cholecalciferol, esomeprazole, oxycodone  hcl, tizanidine , [START ON 04/12/2024] oxycodone  hcl, [START ON 05/12/2024] oxycodone  hcl, and [START ON 06/11/2024] oxycodone  hcl.  Pharmacotherapy (Medications Ordered): Meds ordered this encounter  Medications   Oxycodone  HCl 10 MG TABS    Sig: Take 1 tablet (10 mg total) by mouth every 6 (six) hours as needed. Must last 30 days    Dispense:  120 tablet    Refill:  0    DO NOT: delete (not duplicate); no partial-fill (will deny script to complete), no refill request (F/U required). DISPENSE: 1 day early if closed on fill date. WARN: No CNS-depressants within 8 hrs of med.   Oxycodone  HCl 10 MG TABS    Sig: Take 1 tablet (10 mg total) by mouth every 6 (six) hours as needed. Must last 30 days    Dispense:  120 tablet    Refill:  0    DO NOT: delete (not duplicate); no partial-fill (will deny script to complete), no refill request (F/U required). DISPENSE: 1 day early if closed on fill date. WARN: No CNS-depressants within 8 hrs of med.   Oxycodone  HCl 10 MG TABS    Sig: Take 1 tablet (10 mg total) by mouth every 6 (six) hours as needed. Must last 30 days    Dispense:  120 tablet    Refill:  0     DO NOT: delete (not duplicate); no partial-fill (will deny script to complete), no refill request (F/U required). DISPENSE: 1 day early if closed on fill date. WARN: No CNS-depressants within 8 hrs of med.   Orders:  No orders of the defined types were placed in this encounter.       Return in about 3 months (around 07/07/2024) for (F2F), (MM), Emmy Blanch NP.    Recent Visits Date Type Provider Dept  02/07/24 Office Visit Tanya Glisson, MD [facility]  01/20/24 Procedure visit Tanya Glisson, MD [facility]  01/10/24 Office Visit Kaeleigh Westendorf K, NP [facility]  Showing recent visits within past 90 days and meeting all other requirements Today's Visits Date Type Provider Dept  04/06/24 Office Visit Hamilton Marinello K, NP [facility]  Showing today's visits and meeting all other requirements Future Appointments Date Type Provider Dept  07/03/24 Appointment Saturnino Liew K, NP [facility]  Showing future appointments within next 90 days and meeting all other requirements  I discussed the assessment and treatment plan with the patient. The patient was provided an opportunity to ask questions and all were answered. The patient agreed with the plan and demonstrated an understanding of the instructions.  Patient advised to call back or seek an in-person evaluation if the symptoms or condition worsens.  I personally spent a total of 30 minutes in the care of the patient today including preparing to see the patient, getting/reviewing separately obtained history, performing a medically appropriate exam/evaluation, counseling and educating, placing orders, referring and communicating with other health care professionals, documenting clinical information in the EHR, independently interpreting results, communicating results, and coordinating care.   Note by: Emmy MARLA Blanch, NP  Date: 04/06/2024; Time: 8:41 AM

## 2024-04-07 LAB — BCR-ABL1 FISH
Cells Analyzed: 200
Cells Counted: 200

## 2024-04-12 ENCOUNTER — Inpatient Hospital Stay: Attending: Oncology | Admitting: Oncology

## 2024-04-12 ENCOUNTER — Encounter: Payer: Self-pay | Admitting: Oncology

## 2024-04-12 VITALS — BP 131/78 | HR 80 | Temp 97.4°F | Resp 16 | Wt 172.0 lb

## 2024-04-12 DIAGNOSIS — D72829 Elevated white blood cell count, unspecified: Secondary | ICD-10-CM | POA: Diagnosis present

## 2024-04-12 DIAGNOSIS — Z720 Tobacco use: Secondary | ICD-10-CM | POA: Diagnosis not present

## 2024-04-12 DIAGNOSIS — D75839 Thrombocytosis, unspecified: Secondary | ICD-10-CM | POA: Diagnosis not present

## 2024-04-12 NOTE — Assessment & Plan Note (Signed)
 JAK2 [id number] mutation negative, with reflex to other mutations CALR, MPL, JAK 2 Ex 12-15 mutations negative. Negative BCR-ABL 1 FISH.  Thrombocytosis likely reactive due to smoking.

## 2024-04-12 NOTE — Assessment & Plan Note (Signed)
 Recommend smoke cessation.  He has stopped for 16 days.  Encouraged his effort

## 2024-04-12 NOTE — Progress Notes (Signed)
 Hematology/Oncology Progress note [phone] [phone]           REFERRING PROVIDER: Cyrus Mayo Hestle,*   CHIEF COMPLAINTS/REASON FOR VISIT:  Thrombocytosis and erythrocytosis   ASSESSMENT & PLAN:   Leukocytosis Labs are reviewed and discussed with patient.  Leukocytosis, primarily neutrophilia.  Normal LDH, negative peripheral flowcytometry, negative hepatitis, negative HIV, negative EBV, no M protein on protein electrophoresis.  Slightly elevated light chain ratio, nonspecific.    Leukocytosis is likely reactive, secondary to smoking.  Thrombocytosis JAK2 V617F mutation negative, with reflex to other mutations CALR, MPL, JAK 2 Ex 12-15 mutations negative. Negative BCR-ABL 1 FISH.  Thrombocytosis likely reactive due to smoking.  Tobacco use Recommend smoke cessation.  He has stopped for 16 days.  Encouraged his effort   Follow up as needed All questions were answered. The patient knows to call the clinic with any problems, questions or concerns.  Zelphia Cap, MD, PhD [facility] Hematology Oncology 04/12/2024   HISTORY OF PRESENTING ILLNESS:   Derek [name] is a  43 y.o.  male with PMH listed below was seen in consultation at the request of  Cyrus Mayo Moose,*  for evaluation of leukocytosis   Discussed the use of AI scribe software for clinical note transcription with the patient, who gave verbal consent to proceed.   I reviewed his previous lab records.  His white blood cell count has been elevated since 2020, The most recent blood work from February 15, 2024, was part of a routine physical exam. Total wbc 18.7, neutrophilia, basophilia.   He has a 30-year history of smoking, currently smoking about a pack a day, and is in the process of quitting with the help of Chantix. He does not consume alcohol.  He experiences chronic back inflammation and receives steroid injections three to four times a year, with the last injection  occurring in mid-June 2025. He has hardware in his back and a history of sinus issues, including a recent episode of gum spots releasing pus, possibly related to dental extractions.  He denies any non-healing wounds or prosthetic joints. He has a history of anxiety, arthritis, GERD, and acid reflux, for which he takes Celecoxib, Flexeril , Nexium, and oxycodone .  Denies weight loss, fever, chills, fatigue, night sweats.     INTERVAL HISTORY Derek [name] is a 43 y.o. male who has above history reviewed by me today presents for follow up visit for leukocytosis and thrombocytosis.  Patient has stopped smoking for 16 days.  He has no new complaints.  He presented to discuss results.   MEDICAL HISTORY:  Past Medical History:  Diagnosis Date   Anxiety    IN THE PAST   Arthritis    GERD (gastroesophageal reflux disease)    TAKES OTC    SURGICAL HISTORY: Past Surgical History:  Procedure Laterality Date   BACK SURGERY     LUMBAR LAMINECTOMY   HAND REIMPLANTED     LEFT HAND 1985   LUMBAR FUSION  11/14    SOCIAL HISTORY: Social History   Socioeconomic History   Marital status: Divorced    Spouse name: Not on file   Number of children: Not on file   Years of education: Not on file   Highest education level: Not on file  Occupational History   Not on file  Tobacco Use   Smoking status: Every Day    Current packs/day: 1.00    Average packs/day: 1 pack/day for 30.0 years (30.0 ttl pk-yrs)  Types: Cigarettes   Smokeless tobacco: Former  Substance and Sexual Activity   Alcohol use: Yes    Comment: VERY RARE, SOCIAL   Drug use: No   Sexual activity: Not on file  Other Topics Concern   Not on file  Social History Narrative   Not on file   Social Drivers of Health   Financial Resource Strain: Low Risk  (03/17/2024)   Overall Financial Resource Strain (CARDIA)    Difficulty of Paying Living Expenses: Not very hard  Food Insecurity: No Food Insecurity (03/17/2024)    Hunger Vital Sign    Worried About Running Out of Food in the Last Year: Never true    Ran Out of Food in the Last Year: Never true  Transportation Needs: No Transportation Needs (03/17/2024)   PRAPARE - Administrator, Civil Service (Medical): No    Lack of Transportation (Non-Medical): No  Physical Activity: Not on file  Stress: No Stress Concern Present (03/17/2024)   [employer] of Occupational Health - Occupational Stress Questionnaire    Feeling of Stress: Not at all  Social Connections: Not on file  Intimate Partner Violence: Not At Risk (03/17/2024)   Humiliation, Afraid, Rape, and Kick questionnaire    Fear of Current or Ex-Partner: No    Emotionally Abused: No    Physically Abused: No    Sexually Abused: No    FAMILY HISTORY: History reviewed. No pertinent family history.  ALLERGIES:  is allergic to hydrocodone , amoxicillin, and vicodin [hydrocodone -acetaminophen ].  MEDICATIONS:  Current Outpatient Medications  Medication Sig Dispense Refill   Calcium  Carb-Cholecalciferol 600-10 MG-MCG TABS Take 2 tablets by mouth every morning.     celecoxib (CELEBREX) 100 MG capsule Take 100 mg by mouth 2 (two) times daily.     cyclobenzaprine  (FLEXERIL ) 10 MG tablet Take 10 mg by mouth 3 (three) times daily as needed for muscle spasms.     Diclofenac Sodium (PENNSAID TD) Place onto the skin as needed.     esomeprazole (NEXIUM) 20 MG capsule Take 20 mg by mouth daily at 12 noon.     hydrOXYzine (ATARAX) 25 MG tablet Take 25 mg by mouth 2 (two) times daily.     naloxone  (NARCAN ) nasal spray 4 mg/0.1 mL Place 1 spray into the nose as needed for up to 365 doses (for opioid-induced respiratory depresssion). In case of emergency (overdose), spray once into each nostril. If no response within 3 minutes, repeat application and call 911. 1 each 1   Oxycodone  HCl 10 MG TABS Take 1 tablet (10 mg total) by mouth every 6 (six) hours as needed. Must last 30 days 120 tablet 0    Oxycodone  HCl 10 MG TABS Take 1 tablet (10 mg total) by mouth every 6 (six) hours as needed. Must last 30 days 120 tablet 0   [START ON 05/12/2024] Oxycodone  HCl 10 MG TABS Take 1 tablet (10 mg total) by mouth every 6 (six) hours as needed. Must last 30 days 120 tablet 0   [START ON 06/11/2024] Oxycodone  HCl 10 MG TABS Take 1 tablet (10 mg total) by mouth every 6 (six) hours as needed. Must last 30 days 120 tablet 0   tiZANidine  (ZANAFLEX ) 4 MG tablet Take 1 tablet (4 mg total) by mouth 2 (two) times daily as needed for muscle spasms (For day-time muscle pain/spasm). 60 tablet 5   No current facility-administered medications for this visit.    Review of Systems  Constitutional:  Negative for appetite change,  chills, fatigue, fever and unexpected weight change.  HENT:   Negative for hearing loss and voice change.   Eyes:  Negative for eye problems and icterus.  Respiratory:  Negative for chest tightness, cough and shortness of breath.   Cardiovascular:  Negative for chest pain and leg swelling.  Gastrointestinal:  Negative for abdominal distention and abdominal pain.  Endocrine: Negative for hot flashes.  Genitourinary:  Negative for difficulty urinating, dysuria and frequency.   Musculoskeletal:  Negative for arthralgias.  Skin:  Negative for itching and rash.  Neurological:  Negative for light-headedness and numbness.  Hematological:  Negative for adenopathy. Does not bruise/bleed easily.  Psychiatric/Behavioral:  Negative for confusion.    PHYSICAL EXAMINATION:  Vitals:   04/12/24 1306  BP: 131/78  Pulse: 80  Resp: 16  Temp: (!) 97.4 F (36.3 C)  SpO2: 98%   Filed Weights   04/12/24 1306  Weight: 172 lb (78 kg)    Physical Exam Constitutional:      General: He is not in acute distress. HENT:     Head: Normocephalic and atraumatic.  Eyes:     General: No scleral icterus. Cardiovascular:     Rate and Rhythm: Normal rate.  Pulmonary:     Effort: Pulmonary effort is  normal. No respiratory distress.  Abdominal:     General: There is no distension.  Musculoskeletal:        General: No deformity. Normal range of motion.     Cervical back: Normal range of motion and neck supple.  Skin:    Findings: No erythema or rash.     Comments: Multiple skin tattoos  Neurological:     Mental Status: He is alert and oriented to person, place, and time. Mental status is at baseline.  Psychiatric:        Mood and Affect: Mood normal.     LABORATORY DATA:  I have reviewed the data as listed    Latest Ref Rng & Units 03/17/2024   10:17 AM 02/21/2015    3:22 AM 02/12/2015    8:52 AM  CBC  WBC 4.0 - 10.5 K/uL 19.8  27.5  9.0   Hemoglobin 13.0 - 17.0 g/dL 86.5  86.8  85.9   Hematocrit 39.0 - 52.0 % 43.1  39.9  42.3   Platelets 150 - 400 K/uL 403  248  228       Latest Ref Rng & Units 03/17/2024   10:18 AM 01/16/2019    2:48 PM 01/24/2018    9:15 AM  CMP  Glucose 70 - 99 mg/dL 861  895  89   BUN 6 - 20 mg/dL 14  11  8    Creatinine 0.61 - 1.24 mg/dL 9.07  8.91  8.97   Sodium 135 - 145 mmol/L 133  143  144   Potassium 3.5 - 5.1 mmol/L 3.7  4.0  4.1   Chloride 98 - 111 mmol/L 107  105  104   CO2 22 - 32 mmol/L 20     Calcium  8.9 - 10.3 mg/dL 8.9  89.7  9.9   Total Protein 6.5 - 8.1 g/dL 7.5  7.0  7.2   Total Bilirubin 0.0 - 1.2 mg/dL 0.4  0.3  0.3   Alkaline Phos 38 - 126 U/L 86  108  101   AST 15 - 41 U/L 28  24  22    ALT 0 - 44 U/L 20         RADIOGRAPHIC STUDIES: I have personally  reviewed the radiological images as listed and agreed with the findings in the report. DG PAIN CLINIC C-ARM 1-60 MIN NO REPORT Result Date: 01/20/2024 Fluoro was used, but no Radiologist interpretation will be provided. Please refer to NOTES tab for provider progress note.

## 2024-04-12 NOTE — Assessment & Plan Note (Signed)
 Labs are reviewed and discussed with patient.  Leukocytosis, primarily neutrophilia.  Normal LDH, negative peripheral flowcytometry, negative hepatitis, negative HIV, negative EBV, no M protein on protein electrophoresis.  Slightly elevated light chain ratio, nonspecific.    Leukocytosis is likely reactive, secondary to smoking.

## 2024-05-31 ENCOUNTER — Ambulatory Visit

## 2024-06-05 ENCOUNTER — Telehealth: Payer: Self-pay | Admitting: Pain Medicine

## 2024-06-05 ENCOUNTER — Other Ambulatory Visit: Payer: Self-pay | Admitting: Pain Medicine

## 2024-06-05 DIAGNOSIS — M961 Postlaminectomy syndrome, not elsewhere classified: Secondary | ICD-10-CM

## 2024-06-05 DIAGNOSIS — M51371 Other intervertebral disc degeneration, lumbosacral region with lower extremity pain only: Secondary | ICD-10-CM

## 2024-06-05 DIAGNOSIS — M5417 Radiculopathy, lumbosacral region: Secondary | ICD-10-CM

## 2024-06-05 DIAGNOSIS — G96198 Other disorders of meninges, not elsewhere classified: Secondary | ICD-10-CM

## 2024-06-05 DIAGNOSIS — G8929 Other chronic pain: Secondary | ICD-10-CM

## 2024-06-05 NOTE — Telephone Encounter (Signed)
 Patient would like to go ahead with the next RACZ procedure. Says Dr Tanya told him to call and he would put the order in. Please check with Dr Naveira about an order. Thanks [employer]

## 2024-06-05 NOTE — Progress Notes (Signed)
 The patient called indicating recurrence of his low back and lower extremity radicular pain from the postlaminectomy syndrome and epidural fibrosis.  He would like to have his Racz procedure repeated.  His last Racz procedure treatment was on 10/19/2023 and it provided the patient with an ongoing 75% relief of the pain which recently has worn off.  He would like to have it repeated.

## 2024-06-05 NOTE — Telephone Encounter (Signed)
Message sent to Dr Naveira 

## 2024-06-22 ENCOUNTER — Encounter: Payer: Self-pay | Admitting: Pain Medicine

## 2024-06-22 ENCOUNTER — Ambulatory Visit
Admission: RE | Admit: 2024-06-22 | Discharge: 2024-06-22 | Disposition: A | Source: Ambulatory Visit | Attending: Pain Medicine | Admitting: Pain Medicine

## 2024-06-22 ENCOUNTER — Ambulatory Visit: Admitting: Pain Medicine

## 2024-06-22 VITALS — BP 106/96 | HR 88 | Temp 98.2°F | Resp 12 | Ht 70.0 in | Wt 180.0 lb

## 2024-06-22 DIAGNOSIS — M960 Pseudarthrosis after fusion or arthrodesis: Secondary | ICD-10-CM | POA: Diagnosis not present

## 2024-06-22 DIAGNOSIS — M5417 Radiculopathy, lumbosacral region: Secondary | ICD-10-CM

## 2024-06-22 DIAGNOSIS — M79604 Pain in right leg: Secondary | ICD-10-CM | POA: Insufficient documentation

## 2024-06-22 DIAGNOSIS — M5442 Lumbago with sciatica, left side: Secondary | ICD-10-CM | POA: Insufficient documentation

## 2024-06-22 DIAGNOSIS — G96198 Other disorders of meninges, not elsewhere classified: Secondary | ICD-10-CM

## 2024-06-22 DIAGNOSIS — M961 Postlaminectomy syndrome, not elsewhere classified: Secondary | ICD-10-CM | POA: Diagnosis not present

## 2024-06-22 DIAGNOSIS — M79605 Pain in left leg: Secondary | ICD-10-CM | POA: Insufficient documentation

## 2024-06-22 DIAGNOSIS — M545 Low back pain, unspecified: Secondary | ICD-10-CM

## 2024-06-22 DIAGNOSIS — M5441 Lumbago with sciatica, right side: Secondary | ICD-10-CM | POA: Insufficient documentation

## 2024-06-22 DIAGNOSIS — G8929 Other chronic pain: Secondary | ICD-10-CM | POA: Diagnosis not present

## 2024-06-22 MED ORDER — FENTANYL CITRATE (PF) 100 MCG/2ML IJ SOLN
25.0000 ug | INTRAMUSCULAR | Status: DC | PRN
  Administered 2024-06-22: 09:00:00 50 ug via INTRAVENOUS

## 2024-06-22 MED ORDER — MIDAZOLAM HCL 5 MG/5ML IJ SOLN
INTRAMUSCULAR | Status: AC
  Filled 2024-06-22: qty 5

## 2024-06-22 MED ORDER — IOHEXOL 180 MG/ML  SOLN
10.0000 mL | Freq: Once | INTRAMUSCULAR | Status: AC
  Administered 2024-06-22: 09:00:00 10 mL via EPIDURAL

## 2024-06-22 MED ORDER — HYALURONIDASE HUMAN 150 UNIT/ML IJ SOLN
1500.0000 [IU] | Freq: Once | INTRAMUSCULAR | Status: AC
  Administered 2024-06-22: 09:00:00 1500 [IU] via INTRADERMAL
  Filled 2024-06-22: qty 10

## 2024-06-22 MED ORDER — LIDOCAINE-EPINEPHRINE (PF) 2 %-1:200000 IJ SOLN
10.0000 mL | Freq: Once | INTRAMUSCULAR | Status: AC
  Administered 2024-06-22: 09:00:00 10 mL

## 2024-06-22 MED ORDER — SODIUM CHLORIDE 0.9% FLUSH
4.0000 mL | Freq: Once | INTRAVENOUS | Status: AC
  Administered 2024-06-22: 09:00:00 4 mL

## 2024-06-22 MED ORDER — MIDAZOLAM HCL 5 MG/5ML IJ SOLN
0.5000 mg | Freq: Once | INTRAMUSCULAR | Status: AC
  Administered 2024-06-22: 09:00:00 2 mg via INTRAVENOUS

## 2024-06-22 MED ORDER — SODIUM CHLORIDE (PF) 0.9 % IJ SOLN
INTRAMUSCULAR | Status: AC
  Filled 2024-06-22: qty 10

## 2024-06-22 MED ORDER — LIDOCAINE HCL 2 % IJ SOLN
20.0000 mL | Freq: Once | INTRAMUSCULAR | Status: AC
  Administered 2024-06-22: 09:00:00 400 mg

## 2024-06-22 MED ORDER — TRIAMCINOLONE ACETONIDE 40 MG/ML IJ SUSP
40.0000 mg | Freq: Once | INTRAMUSCULAR | Status: AC
  Administered 2024-06-22: 09:00:00 40 mg

## 2024-06-22 MED ORDER — CEFAZOLIN SODIUM 1 G IJ SOLR
INTRAMUSCULAR | Status: AC
  Filled 2024-06-22: qty 10

## 2024-06-22 MED ORDER — ROPIVACAINE HCL 2 MG/ML IJ SOLN
INTRAMUSCULAR | Status: AC
  Filled 2024-06-22: qty 20

## 2024-06-22 MED ORDER — LIDOCAINE HCL 2 % IJ SOLN
INTRAMUSCULAR | Status: AC
  Filled 2024-06-22: qty 20

## 2024-06-22 MED ORDER — FENTANYL CITRATE (PF) 100 MCG/2ML IJ SOLN
INTRAMUSCULAR | Status: AC
  Filled 2024-06-22: qty 2

## 2024-06-22 MED ORDER — IOHEXOL 180 MG/ML  SOLN
INTRAMUSCULAR | Status: AC
  Filled 2024-06-22: qty 10

## 2024-06-22 MED ORDER — STERILE WATER FOR INJECTION IJ SOLN
10.0000 mL | Freq: Once | INTRAVENOUS | Status: AC
  Administered 2024-06-22: 09:00:00 10 mL via EPIDURAL
  Filled 2024-06-22: qty 4.27

## 2024-06-22 MED ORDER — CEFAZOLIN SODIUM-DEXTROSE 1-4 GM/50ML-% IV SOLN
1.0000 g | INTRAVENOUS | Status: AC
  Administered 2024-06-22: 08:00:00 1 g via INTRAVENOUS

## 2024-06-22 MED ORDER — TRIAMCINOLONE ACETONIDE 40 MG/ML IJ SUSP
INTRAMUSCULAR | Status: AC
  Filled 2024-06-22: qty 1

## 2024-06-22 MED ORDER — PENTAFLUOROPROP-TETRAFLUOROETH EX AERO: INHALATION_SPRAY | Freq: Once | CUTANEOUS | Status: AC

## 2024-06-22 MED ADMIN — Ropivacaine HCl Inj 2 MG/ML: 4 mL | EPIDURAL | @ 09:00:00 | NDC 43066001501

## 2024-06-22 NOTE — Progress Notes (Signed)
 PROVIDER NOTE: Interpretation of information contained herein should be left to medically-trained personnel. Specific patient instructions are provided elsewhere under Patient Instructions section of medical record. This document was created in part using STT-dictation technology, any transcriptional errors that may result from this process are unintentional.  Patient: Derek [name] Type: Established DOB: [birth year] MRN: [id number] PCP: Cyrus Selinda Moose, PA-C  Service: Procedure DOS: 06/22/2024 Setting: Ambulatory Location: Ambulatory outpatient facility Delivery: Face-to-face Provider: Eric DELENA Como, MD Specialty: Interventional Pain Management Specialty designation: 09 Location: Outpatient facility Ref. Prov.: Whitaker, Selinda Moose, PA-C       Interventional Therapy   Primary Reason for Visit: Interventional Pain Management Treatment. CC: Back Pain (lower)    Procedure:          Anesthesia, Analgesia, Anxiolysis:  Type: Therapeutic Percutaneous Epidural Neuroplasty and Lysis of Adhesions (RACZ Procedure)  #11 (NO Hypertonic) Region: Caudal Level: Sacrococcygeal   Laterality: Midline aiming at the left  Anesthesia: Local (1-2% Lidocaine )  Anxiolysis: IV  Sedation: Moderate  Guidance: Fluoroscopy Spinal ([id number])   Position: Prone   1. Chronic low back pain (1ry area of Pain) (Bilateral) (L>R) w/ sciatica (Bilateral)   2. Chronic lower extremity pain (2ry area of Pain) (Bilateral) (L>R)   3. Failed back surgical syndrome (x3)   4. L5-S1 pseudoarthrosis   5. Low back pain of over 3 months duration   6. Epidural fibrosis   7. Radicular pain of lumbosacral region    NAS-11 Pain score:   Pre-procedure: 4 /10   Post-procedure: 0-No pain/10   Note: Pt. did get some saddle & LE numbness & weakness. No hypertonic injected since results suggested some intrathecal spread. Kept under observation until numbness & weakness improved.    H&P (Pre-op Assessment):  Mr.  Derek [name] is a 43 y.o. (year old), male patient, seen today for interventional treatment. He  has a past surgical history that includes Back surgery; HAND REIMPLANTED; and Lumbar fusion (11/14). [name] has a current medication list which includes the following prescription(s): calcium  carb-cholecalciferol, celecoxib, cyclobenzaprine , diclofenac sodium, esomeprazole, hydroxyzine, naloxone , oxycodone  hcl, oxycodone  hcl, oxycodone  hcl, oxycodone  hcl, and tizanidine , and the following Facility-Administered Medications: fentanyl . His primarily concern today is the Back Pain (lower)  Initial Vital Signs:  Pulse/HCG Rate: 88ECG Heart Rate: 80 Temp: (!) 97.2 F (36.2 C) Resp: 16 BP: 125/74 SpO2: 96 %  BMI: Estimated body mass index is 25.83 kg/m as calculated from the following:   Height as of this encounter: 5' 10 (1.778 m).   Weight as of this encounter: 180 lb (81.6 kg).  Risk Assessment: Allergies: Reviewed. He is allergic to hydrocodone  and amoxicillin.  Allergy Precautions: None required Coagulopathies: Reviewed. None identified.  Blood-thinner therapy: None at this time Active Infection(s): Reviewed. None identified. [name] is afebrile  Site Confirmation: [name] was asked to confirm the procedure and laterality before marking the site Procedure checklist: Completed Consent: Before the procedure and under the influence of no sedative(s), amnesic(s), or anxiolytics, the patient was informed of the treatment options, risks and possible complications. To fulfill our ethical and legal obligations, as recommended by the American Medical Association's Code of Ethics, I have informed the patient of my clinical impression; the nature and purpose of the treatment or procedure; the risks, benefits, and possible complications of the intervention; the alternatives, including doing nothing; the risk(s) and benefit(s) of the alternative treatment(s) or procedure(s); and the risk(s) and benefit(s) of  doing nothing. The patient was provided information about the general risks  and possible complications associated with the procedure. These may include, but are not limited to: failure to achieve desired goals, infection, bleeding, organ or nerve damage, allergic reactions, paralysis, and death. In addition, the patient was informed of those risks and complications associated to Spine-related procedures, such as failure to decrease pain; infection (i.e.: Meningitis, epidural or intraspinal abscess); bleeding (i.e.: epidural hematoma, subarachnoid hemorrhage, or any other type of intraspinal or peri-dural bleeding); organ or nerve damage (i.e.: Any type of peripheral nerve, nerve root, or spinal cord injury) with subsequent damage to sensory, motor, and/or autonomic systems, resulting in permanent pain, numbness, and/or weakness of one or several areas of the body; allergic reactions; (i.e.: anaphylactic reaction); and/or death. Furthermore, the patient was informed of those risks and complications associated with the medications. These include, but are not limited to: allergic reactions (i.e.: anaphylactic or anaphylactoid reaction(s)); adrenal axis suppression; blood sugar elevation that in diabetics may result in ketoacidosis or comma; water  retention that in patients with history of congestive heart failure may result in shortness of breath, pulmonary edema, and decompensation with resultant heart failure; weight gain; swelling or edema; medication-induced neural toxicity; particulate matter embolism and blood vessel occlusion with resultant organ, and/or nervous system infarction; and/or aseptic necrosis of one or more joints. Finally, the patient was informed that Medicine is not an exact science; therefore, there is also the possibility of unforeseen or unpredictable risks and/or possible complications that may result in a catastrophic outcome. The patient indicated having understood very clearly. We have  given the patient no guarantees and we have made no promises. Enough time was given to the patient to ask questions, all of which were answered to the patient's satisfaction. Mr. Ander has indicated that he wanted to continue with the procedure. Attestation: I, the ordering provider, attest that I have discussed with the patient the benefits, risks, side-effects, alternatives, likelihood of achieving goals, and potential problems during recovery for the procedure that I have provided informed consent. Date  Time: 06/22/2024  8:03 AM  Pre-Procedure Preparation:  Monitoring: As per clinic protocol. Respiration, ETCO2, SpO2, BP, heart rate and rhythm monitor placed and checked for adequate function Safety Precautions: Patient was assessed for positional comfort and pressure points before starting the procedure. Time-out: I initiated and conducted the Time-out before starting the procedure, as per protocol. The patient was asked to participate by confirming the accuracy of the Time Out information. Verification of the correct person, site, and procedure were performed and confirmed by me, the nursing staff, and the patient. Time-out conducted as per Joint Commission's Universal Protocol (UP.01.01.01). Time: 0841 Start Time: 0841 hrs.  Description of Procedure:          Target Area: Caudal Epidural Canal Approach: Midline approach Area Prepped: Entire Posterior Sacrococcygeal Region ChloraPrep (2% chlorhexidine  gluconate and 70% isopropyl alcohol) Safety Precautions: Aspiration looking for blood return was conducted prior to all injections. At no point did I inject any substances, as a needle was being advanced. No attempts were made at seeking any paresthesias. Safe injection practices and needle disposal techniques used. Medications properly checked for expiration dates. SDV (single dose vial) medications used. Description of the Procedure: Protocol guidelines were followed. The patient was  placed in position over the fluoroscopy table. Patient assessed for comfort and pressure points before starting procedure. The target area was identified and the area prepped in the usual manner. Skin & deeper tissues infiltrated with local anesthetic. Appropriate amount of time allowed to pass for local anesthetics  to take effect. The epidural needle was then advanced to the target area, via the sacral hiatus, between the sacral cornu. Proper needle placement secured. Negative aspiration confirmed. Step 1: Epidurogram performed by slowly injecting a non-ionic, water -soluble, hypoallergenic, myelogram-compatible radiological contrast. Defect identified and Racz catheter advanced slowly next to rest proximal to it without attempting to perforate or puncture the defect. At this point, the epidural needle was removed. Step 2: Contrast was again injected, this time thru the catheter, under live fluoroscopy, closely observing for vascular uptake or intrathecal spread. Step 3: Once no vascular uptake or intrathecal spread was confirmed, a 2 mL test-dose using 2% PF-Lidocaine  with 1:200,000 Epinephrine  was injected thru the catheter, while closely monitoring for an increase in heart rate or evidence of spinal anesthesia. Step 4: After waiting over 5 minutes, the patient was assessed for evidence of a spinal block. Step 5: Once I had confirmed that there was no vascular uptake or evidence of intrathecal spread, I then slowly injected 1,500 Units of hyaluronidase , carefully monitoring for allergic reactions. I then waited 10 minutes, using this time to secure the catheter using a sterile benzoin tincture swab and (12mm x 100 mm) steri-strip. After confirming vitals to be stable, the patient was transferred to the recovery area where Mr. Dady was kept under constant observation and monitored as per post-sedation protocol. Step 6: After the 10 minutes, I proceeded to slowly inject a steroid solution containing 0.2%  MPF-Ropivacaine  (4 mL) + 0.9% PF-NSS (5 mL) + SDV Triamcinolone  40 mg/mL (1 mL), in intermittent fashion, asking for systemic symptoms every 0.5cc of injectate. Step 7:  30 minutes later, a neurological exam was conducted for any evidence of a spinal blockade. Step 8: After having confirmed the presence of a saddle block, I decided to withhold the 10% PF-Hypertonic saline. Once the procedure was completed, I removed the catheter, taking time to show the patient its spring tip, and demonstrating to the patient that none had been left behind. EBL: None Materials & Medications used:  1. Racz Tun-L-Kath (Epimed, [place], [place]) Catheter. (or similar)(Perifix 20Gx100cm) 2. 2 Foam Tape 3. Benzoin tincture swab 4. Steri-Strip (12mm x 100 mm) 5. Non-occlusive Transparent Dressing (47M Tegaderm) 6. Bacteriostatic Filter for Epidural Catheter 7. Epidural Kit for 20G catheter Needle(s) used: 20g - 10cm, Tuohy-style epidural needle   Medications used:  1. Skin infiltration: 2.0% Lidocaine  (10ml) 2. Test-dose: 1.5 % Lidocaine  w/ Epi 1:200,000 (5ml) 3. Epidural Steroid injection:  A). Steroid: Triamcinolone  40 mg/mL (SDV) (1ml) B). Local Anesthetic: 0.2% PF-Ropivacaine  (2 mg/mL) (4 mL) C). Dilution agent: 0.9% PF-NSS (injectable saline) (5 mL) 6. Neurolytic Agent: 10% PF-Sodium Chloride  (Hypertonic Saline) (10ml) [23.4% PF-Sodium Chloride  (4.281mL) + PF-Sterile H2O (5.761mL) = 10% PF-Sodium Chloride  (21mL)] 7. Scar softening agent: Hyaluronidase  (150 units/mL) x (10 mL) = 1500 Units 8. Radiological Contrast Media: Isovue -M 200 (10 mL)  Vitals:   06/22/24 1020 06/22/24 1030 06/22/24 1040 06/22/24 1050  BP: (!) 117/94 114/84 103/72 (!) 106/96  Pulse:      Resp: 15 14 10 12   Temp:      TempSrc:      SpO2: 96% 95% 95% 100%  Weight:      Height:        Start Time: 0841 hrs. End Time: 0958 hrs.  Epidurogram:  Epidurogram flow pattern demonstrated a restricted low at the level of the surgery.  The epidural catheter was placed next to this restriction without attempting to perforate it. Materials:  Needle(s) Type: Tuohy-style  epidural needle Gauge: 17G Length: 3.5-in Medication(s): Please see orders for medications and dosing details.  Imaging Guidance (Spinal):          Type of Imaging Technique: Fluoroscopy Guidance (Spinal) Indication(s): Fluoroscopy guidance for needle placement to enhance accuracy in procedures requiring precise needle localization for targeted delivery of medication in or near specific anatomical locations not easily accessible without such real-time imaging assistance. Exposure Time: Please see nurses notes. Contrast: Before injecting any contrast, we confirmed that the patient did not have an allergy to iodine, shellfish, or radiological contrast. Once satisfactory needle placement was completed at the desired level, radiological contrast was injected. Contrast injected under live fluoroscopy. No contrast complications. See chart for type and volume of contrast used. Fluoroscopic Guidance: I was personally present during the use of fluoroscopy. Tunnel Vision Technique used to obtain the best possible view of the target area. Parallax error corrected before commencing the procedure. Direction-depth-direction technique used to introduce the needle under continuous pulsed fluoroscopy. Once target was reached, antero-posterior, oblique, and lateral fluoroscopic projection used confirm needle placement in all planes. Images permanently stored in EMR.                  Interpretation: I personally interpreted the imaging intraoperatively. Adequate needle placement confirmed in multiple planes. Appropriate spread of contrast into desired area was observed. No evidence of afferent or efferent intravascular uptake. No intrathecal or subarachnoid spread observed. Permanent images saved into the patient's record.  Antibiotic Prophylaxis:   Anti-infectives (From  admission, onward)    Start     Dose/Rate Route Frequency Ordered Stop   06/22/24 0805  ceFAZolin  (ANCEF ) IVPB 1 g/50 mL premix        1 g 100 mL/hr over 30 Minutes Intravenous 30 min pre-op 06/22/24 0805 06/22/24 0849      Indication(s): None identified  Post-operative Assessment:  Post-procedure Vital Signs:  Pulse/HCG Rate: 8881 Temp: 97.6 F (36.4 C) Resp: 12 BP: (!) 106/96 SpO2: 100 %  EBL: None  Complications: No immediate post-treatment complications observed by team, or reported by patient.  Note: The patient tolerated the entire procedure well. A repeat set of vitals were taken after the procedure and the patient was kept under observation following institutional policy, for this type of procedure. Post-procedural neurological assessment was performed, showing return to baseline, prior to discharge. The patient was provided with post-procedure discharge instructions, including a section on how to identify potential problems. Should any problems arise concerning this procedure, the patient was given instructions to immediately contact us , at any time, without hesitation. In any case, we plan to contact the patient by telephone for a follow-up status report regarding this interventional procedure.  Comments:  No additional relevant information.  Plan of Care (POC)  Orders:  Orders Placed This Encounter  Procedures   Racz (One Day)    Equipment: Epidural Tray; Racz Catheter; 10% Hypertonic Saline; Hyaluronidase ; 3/4 Steri-strips; 4x4 Sterile Gauze pack.    Scheduling Instructions:     Procedure: RACZ Epidural Lysis of Adhesions     Side: Midline     Sedation: Moderate Conscious Sedation     Timeframe: 06/22/2024   DG PAIN CLIN[facility] C-ARM 1-60 MIN NO REPORT    Intraoperative interpretation by procedural physician at [facility].    Standing Status:   Standing    Number of Occurrences:   1    Reason for exam::   Assistance in needle guidance and placement for  procedures requiring needle placement  in or near specific anatomical locations not easily accessible without such assistance.   Informed Consent Details: Physician/Practitioner Attestation; Transcribe to consent form and obtain patient signature    Nursing Order: Transcribe to consent form and obtain patient signature. Note: Always confirm laterality of pain with Mr. Zumstein, before procedure.    Physician/Practitioner attestation of informed consent for procedure/surgical case:   I, the physician/practitioner, attest that I have discussed with the patient the benefits, risks, side effects, alternatives, likelihood of achieving goals and potential problems during recovery for the procedure that I have provided informed consent.    Procedure:   RACZ Procedure (Epidural Lysis of Adhesions)    Physician/Practitioner performing the procedure:   Ladarrious Kirksey A. Tanya, MD    Indication/Reason:   Chronic low back pain and lower extremity pain secondary to epidural fibrosis associated with a failed back surgical syndrome   Provide equipment / supplies at bedside    Procedural tray: Epidural Tray (Disposable  single use) Skin infiltration needle: Regular 1.5-in, 25-G, (x1) Block needle size: Regular standard Catheter: RACZ catheter    Standing Status:   Standing    Number of Occurrences:   1    Specify:   Epidural Tray   Verify informed consent Please verify that the patient has received an appropriate explanation of the procedure, including the risks and possible complications. Make sure that all of the patients questions have been answered to the patient's satis...    Please verify that the patient has received an appropriate explanation of the procedure, including the risks and possible complications. Make sure that all of the patients questions have been answered to the patient's satisfaction.    Standing Status:   Standing    Number of Occurrences:   1   Discharge instructions    Please evaluate and  confirm that the patient's neurological status is back to preoperative levels. Confirmed that the patient can void before discharge. Immediately notify the attending physician if the patient is unable to void or if the patient's neurological status shows any deficits. Please provide patient with discharge instructions after the procedure. Scheduled the patient to return for a follow-up 2 weeks after the procedure.    Standing Status:   Standing    Number of Occurrences:   1   Follow-up    Please evaluate the patient's condition and answer any questions that they may have regarding the postoperative care.    Standing Status:   Standing    Number of Occurrences:   1    Specify:   Call the patient the day after the procedure to evaluate their postoperative status.   Saline lock IV    Have LR [phone] mL available and administer at 125 mL/hr if patient becomes hypotensive.    Standing Status:   Standing    Number of Occurrences:   1     Opioid Analgesia: Oxycodone  IR 10 mg, 1 tab PO q 6 hrs (40 mg/day of oxycodone ) MME/day: 60 mg/day.    Medications ordered for procedure: Meds ordered this encounter  Medications   iohexol  (OMNIPAQUE ) 180 MG/ML injection 10 mL    Must be Myelogram-compatible. If not available, you may substitute with a water -soluble, non-ionic, hypoallergenic, myelogram-compatible radiological contrast medium.   lidocaine  (XYLOCAINE ) 2 % (with pres) injection 400 mg   pentafluoroprop-tetrafluoroeth (GEBAUERS) aerosol   midazolam  (VERSED ) 5 MG/5ML injection 0.5-2 mg    Make sure Flumazenil is available in the pyxis when using this medication. If oversedation occurs, administer 0.2  mg IV over 15 sec. If after 45 sec no response, administer 0.2 mg again over 1 min; may repeat at 1 min intervals; not to exceed 4 doses (1 mg)   fentaNYL  (SUBLIMAZE ) injection 25-50 mcg    Make sure Narcan  is available in the pyxis when using this medication. In the event of respiratory depression  (RR< 8/min): Titrate NARCAN  (naloxone ) in increments of 0.1 to 0.2 mg IV at 2-3 minute intervals, until desired degree of reversal.   lidocaine -EPINEPHrine  (XYLOCAINE  W/EPI) 2 %-1:200000 (PF) injection 10 mL    If 2% is unavailable, may be substituted with 1.5%, but must be preservative-free. If 1:200,000 concentration of epinephrine  is not available, it may be substituted with 1:100,000. Notify physician of changes, before procedure.   ropivacaine  (PF) 2 mg/mL (0.2%) (NAROPIN ) injection 4 mL   triamcinolone  acetonide (KENALOG -40) injection 40 mg   hyaluronidase  Human (HYLENEX ) injection 1,500 Units    10 mL of the 150 Units/mL   sodium chloride  hypertonic 10% epidural injection    For Racz Epidural Lysis of Adhesions.   sodium chloride  flush (NS) 0.9 % injection 4 mL   ceFAZolin  (ANCEF ) IVPB 1 g/50 mL premix    Antibiotic Indication::   Surgical Prophylaxis   Medications administered: We administered iohexol , lidocaine , pentafluoroprop-tetrafluoroeth, midazolam , fentaNYL , lidocaine -EPINEPHrine , ropivacaine  (PF) 2 mg/mL (0.2%), triamcinolone  acetonide, hyaluronidase  Human, sodium chloride  hypertonic 10% epidural injection, sodium chloride  flush, and ceFAZolin .  See the medical record for exact dosing, route, and time of administration.    Interventional Therapies  Risk Factors  Considerations:      Lumbar Hardware: No RFA    Planned  Pending:      Under consideration:   Diagnostic x-rays of both hips  Diagnostic/therapeutic bilateral IA hip joint injection #1  Possible spinal cord stimulator trial    Completed:   Therapeutic right L2-3 LESI x1 (08/29/2020) (0/0/75/> 75)  Diagnostic left lumbar facet MBB x2 (01/15/2022) (100/100/75/75)  Diagnostic right lumbar facet MBB x1 (01/15/2022) (100/100/75/75)  Therapeutic bilateral lumbar hardware Block/lumbar facet MBB R2L3 (01/20/2024) (90/90/90/90) (for LBP) Diagnostic left SI Blk x1 (03/21/2020) (100/100/90 x 1.5 weeks)   Therapeutic/palliative right lateral epicondyle (elbow) inj. x2 (01/21/2021) (0/0/95/95)  Therapeutic/palliative left RACZ procedure x10 (targeting the left S2, S3 area) (10/19/2023) (100/100/75/75) (for LEP)  Therapeutic left RACZ procedure #8 (10/01/2022) (+ saddle block - NO HYPERTONIC injected) (100/100/90/95)    Therapeutic  Palliative (PRN) options:   Palliative right lateral epicondyle (elbow) inj. #2  Palliative left RACZ procedure #8 (targeting the left S2, S3 area)    Pharmacotherapy  Nonopioids transferred 05/02/2020: Zanaflex , Flexeril , and Mobic        Follow-up plan:   Return in about 2 weeks (around 07/06/2024) for (Face2F), (PPE).     Recent Visits Date Type Provider Dept  04/06/24 Office Visit Patel, Seema K, NP [facility]  Showing recent visits within past 90 days and meeting all other requirements Today's Visits Date Type Provider Dept  06/22/24 Procedure visit Tanya Glisson, MD [facility]  Showing today's visits and meeting all other requirements Future Appointments Date Type Provider Dept  07/03/24 Appointment Patel, Seema K, NP [facility]  07/19/24 Appointment Tanya Glisson, MD [facility]  Showing future appointments within next 90 days and meeting all other requirements   Disposition: Discharge home  Discharge (Date  Time): 06/22/2024; 1058 hrs.   Primary Care Physician: Cyrus Selinda Moose, PA-C Location: [facility] Note by: Glisson DELENA Tanya, MD (TTS technology  used. I apologize for any typographical errors that were not detected and corrected.) Date: 06/22/2024; Time: 11:04 AM  Disclaimer:  Medicine is not an visual merchandiser. The only guarantee in medicine is that nothing is guaranteed. It is important to note that the decision to proceed with this intervention was based on the information collected from the patient. The Data and conclusions were drawn from the  patient's questionnaire, the interview, and the physical examination. Because the information was provided in large part by the patient, it cannot be guaranteed that it has not been purposely or unconsciously manipulated. Every effort has been made to obtain as much relevant data as possible for this evaluation. It is important to note that the conclusions that lead to this procedure are derived in large part from the available data. Always take into account that the treatment will also be dependent on availability of resources and existing treatment guidelines, considered by other Pain Management Practitioners as being common knowledge and practice, at the time of the intervention. For Medico-Legal purposes, it is also important to point out that variation in procedural techniques and pharmacological choices are the acceptable norm. The indications, contraindications, technique, and results of the above procedure should only be interpreted and judged by a Board-Certified Interventional Pain Specialist with extensive familiarity and expertise in the same exact procedure and technique.

## 2024-06-22 NOTE — Patient Instructions (Addendum)
 ______________________________________________________________________    Post-Procedure Discharge Instructions  INSTRUCTIONS Apply ice:  Purpose: This will minimize any swelling and discomfort after procedure.  When: Day of procedure, as soon as you get home. How: Fill a plastic sandwich bag with crushed ice. Cover it with a small towel and apply to injection site. How long: (15 min on, 15 min off) Apply for 15 minutes then remove x 15 minutes.  Repeat sequence on day of procedure, until you go to bed. Apply heat:  Purpose: To treat any soreness and discomfort from the procedure. When: Starting the next day after the procedure. How: Apply heat to procedure site starting the day following the procedure. How long: May continue to repeat daily, until discomfort goes away. Food intake: Start with clear liquids (like water ) and advance to regular food, as tolerated.  Physical activities: Keep activities to a minimum for the first 8 hours after the procedure. After that, then as tolerated. Driving: If you have received any sedation, be responsible and do not drive. You are not allowed to drive for 24 hours after having sedation. Blood thinner: (Applies only to those taking blood thinners) You may restart your blood thinner 6 hours after your procedure. Insulin : (Applies only to Diabetic patients taking insulin ) As soon as you can eat, you may resume your normal dosing schedule. Infection prevention: Keep procedure site clean and dry. Shower daily and clean area with soap and water .  PAIN DIARY Post-procedure Pain Diary: Extremely important that this be done correctly and accurately. Recorded information will be used to determine the next step in treatment. For the purpose of accuracy, follow these rules: Evaluate only the area treated. Do not report or include pain from an untreated area. For the purpose of this evaluation, ignore all other areas of pain, except for the treated area. After your  procedure, avoid taking a long nap and attempting to complete the pain diary after you wake up. Instead, set your alarm clock to go off every hour, on the hour, for the initial 8 hours after the procedure. Document the duration of the numbing medicine, and the relief you are getting from it. Do not go to sleep and attempt to complete it later. It will not be accurate. If you received sedation, it is likely that you were given a medication that may cause amnesia. Because of this, completing the diary at a later time may cause the information to be inaccurate. This information is needed to plan your care. Follow-up appointment: Keep your post-procedure follow-up evaluation appointment after the procedure (usually 2 weeks for most procedures, 6 weeks for radiofrequencies). DO NOT FORGET to bring you pain diary with you.   EXPECT... (What should I expect to see with my procedure?) From numbing medicine (AKA: Local Anesthetics): Numbness or decrease in pain. You may also experience some weakness, which if present, could last for the duration of the local anesthetic. Onset: Full effect within 15 minutes of injected. Duration: It will depend on the type of local anesthetic used. On the average, 1 to 8 hours.  From steroids (Applies only if steroids were used): Decrease in swelling or inflammation. Once inflammation is improved, relief of the pain will follow. Onset of benefits: Depends on the amount of swelling present. The more swelling, the longer it will take for the benefits to be seen. In some cases, up to 10 days. Duration: Steroids will stay in the system x 2 weeks. Duration of benefits will depend on multiple posibilities including persistent irritating  factors. Side-effects: If present, they may typically last 2 weeks (the duration of the steroids). Frequent: Cramps (if they occur, drink Gatorade and take over-the-counter Magnesium  450-500 mg once to twice a day); water  retention with temporary weight  gain; increases in blood sugar; decreased immune system response; increased appetite. Occasional: Facial flushing (red, warm cheeks); mood swings; menstrual changes. Uncommon: Long-term decrease or suppression of natural hormones; bone thinning. (These are more common with higher doses or more frequent use. This is why we prefer that our patients avoid having any injection therapies in other practices.)  Very Rare: Severe mood changes; psychosis; aseptic necrosis. From procedure: Some discomfort is to be expected once the numbing medicine wears off. This should be minimal if ice and heat are applied as instructed.  CALL IF... (When should I call?) You experience numbness and weakness that gets worse with time, as opposed to wearing off. New onset bowel or bladder incontinence. (Applies only to procedures done in the spine)  Emergency Numbers: Durning business hours (Monday - Thursday, 8:00 AM - 4:00 PM) (Friday, 9:00 AM - 12:00 Noon): [phone] After hours: [phone] NOTE: If you are having a problem and are unable connect with, or to talk to a provider, then go to your nearest urgent care or emergency department. If the problem is serious and urgent, please call 911. ______________________________________________________________________     ______________________________________________________________________    Steroid injections  Common steroids for injections Triamcinolone : Used by many sports medicine physicians for large joint and bursal injections, often combined with a local anesthetic like lidocaine . A study focusing on coccydynia (tailbone pain) found triamcinolone  was more effective than betamethasone, suggesting it may also be preferable for other localized inflammation conditions. Methylprednisolone : A common alternative to triamcinolone  that is also a strong anti-inflammatory. It is available in different formulations, with the acetate suspension being the long-acting  option for intra-articular injections. Dexamethasone : This is a non-particulate steroid, meaning it has a lower risk of tissue damage compared to particulate steroids like triamcinolone  and methylprednisolone . While less common for this specific use, it is an option for targeted injections.   Considerations for physicians Particulate vs. non-particulate steroids: Triamcinolone  and methylprednisolone  are particulate, meaning they can clump together. Dexamethasone  is non-particulate. Particulate steroids are often preferred for their longer-lasting effects but carry a theoretical higher risk for certain injections (though this is less of a concern in the costochondral joints). Combined injectate: Corticosteroids are typically mixed with a local anesthetic like lidocaine  to provide both immediate pain relief (from the anesthetic) and longer-term inflammation reduction (from the steroid). Imaging guidance: To ensure accurate placement of the needle and medication, physicians may use ultrasound or fluoroscopic guidance for the injection, especially in complex or refractory cases.   Patient guidance Before undergoing a steroid injection, discuss the options with your physician. They will determine the best steroid, dosage, and procedure for your specific case based on factors like: Severity of your condition History of response to other treatments Your overall health status Experience and preference of the physician  Last  Updated: 02/29/2024 ______________________________________________________________________    Moderate Conscious Sedation, Adult, Care After After the procedure, it is common to have: Sleepiness for a few hours. Impaired judgment for a few hours. Trouble with balance. Nausea or vomiting if you eat too soon. Follow these instructions at home: For the time period you were told by your health care provider:  Rest. Do not participate in activities where you could fall or become  injured. Do not drive or  use machinery. Do not drink alcohol. Do not take sleeping pills or medicines that cause drowsiness. Do not make important decisions or sign legal documents. Do not take care of children on your own. Eating and drinking Follow instructions from your health care provider about what you may eat and drink. Drink enough fluid to keep your urine pale yellow. If you vomit: Drink clear fluids slowly and in small amounts as you are able. Clear fluids include water , ice chips, low-calorie sports drinks, and fruit juice that has water  added to it (diluted fruit juice). Eat light and bland foods in small amounts as you are able. These foods include bananas, applesauce, rice, lean meats, toast, and crackers. General instructions Take over-the-counter and prescription medicines only as told by your health care provider. Have a responsible adult stay with you for the time you are told. Do not use any products that contain nicotine or tobacco. These products include cigarettes, chewing tobacco, and vaping devices, such as e-cigarettes. If you need help quitting, ask your health care provider. Return to your normal activities as told by your health care provider. Ask your health care provider what activities are safe for you. Your health care provider may give you more instructions. Make sure you know what you can and cannot do. Contact a health care provider if: You are still sleepy or having trouble with balance after 24 hours. You feel light-headed. You vomit every time you eat or drink. You get a rash. You have a fever. You have redness or swelling around the IV site. Get help right away if: You have trouble breathing. You start to feel confused at home. These symptoms may be an emergency. Get help right away. Call 911. Do not wait to see if the symptoms will go away. Do not drive yourself to the hospital. This information is not intended to replace advice given to you by  your health care provider. Make sure you discuss any questions you have with your health care provider. Document Revised: 01/05/2022 Document Reviewed: 01/05/2022 Elsevier Patient Education  2024 [employer].

## 2024-06-22 NOTE — Progress Notes (Signed)
 0850 heart rate 86 test dose given by FN 0856 inject Hylenex  byFN   0857 123/89 finished Hylenex  0958 Catheter removed intact.

## 2024-06-23 ENCOUNTER — Telehealth: Payer: Self-pay

## 2024-06-23 NOTE — Telephone Encounter (Signed)
Called PP denies any needs at this time. Instructed to call if needed. 

## 2024-06-27 NOTE — Progress Notes (Signed)
 PROVIDER NOTE: Interpretation of information contained herein should be left to medically-trained personnel. Specific patient instructions are provided elsewhere under Patient Instructions section of medical record. This document was created in part using AI and STT-dictation technology, any transcriptional errors that may result from this process are unintentional.  Patient: Derek [name]  Service: E/M   PCP: Cyrus Selinda Moose, PA-C  DOB: [birth year]  DOS: 07/03/2024  Provider: Emmy MARLA Blanch, NP  MRN: [id number]  Delivery: Face-to-face  Specialty: Interventional Pain Management  Type: Established Patient  Setting: Ambulatory outpatient facility  Specialty designation: 09  Referring Prov.: Cyrus, Selinda Moose, PA-C  Location: Outpatient office facility       History of present illness (HPI) Derek [name], a 43 y.o. year old male, is here today because of his Lumbar pseudoarthrosis [S32.009K]. [name] primary complain today is Leg Pain  Pertinent problems: [name] has Lumbar pseudoarthrosis (L5-S1); Tobacco use; Chronic low back pain (1ry area of Pain) (Bilateral) (L>R) w/ sciatica (Bilateral); Chronic lower extremity pain (2ry area of Pain) (Bilateral) (L>R); Chronic pain syndrome; Long term current use of opiate analgesic; Pharmacologic therapy; Disorder of skeletal system; Failed back surgical syndrome (x3); L5-S1 pseudoarthrosis; Chronic musculoskeletal pain; Spasm of back muscles; Sacroiliac joint dysfunction (Bilateral); Chronic sacroiliac joint pain (Bilateral) (L>R); Somatic dysfunction of sacroiliac joints (Bilateral); Chronic hip pain (Bilateral); Epidural fibrosis; Lumbar postlaminectomy syndrome; DDD (degenerative disc disease), lumbosacral; Lumbar facet syndrome (Bilateral) (L>R); Other specified dorsopathies, sacral and sacrococcygeal region; Spondylosis without myelopathy or radiculopathy, lumbosacral region; Neurogenic pain; Osteoarthritic spondylosis of lumbar spine;  and Chronic low back pain (Left) w/o sciatica on their pertinent problem list.  Pain Assessment: Severity of Chronic pain is reported as a 2 /10. Location: Leg Left/Down left leg to left knee. Onset: More than a month ago. Quality: Radiating. Timing: Intermittent. Modifying factor(s): Rest, hot shower, procedure. Vitals:  height is 5' 10 (1.778 m) and weight is 180 lb (81.6 kg). His temporal temperature is 97.3 F (36.3 C) (abnormal). His blood pressure is 123/75 and his pulse is 95. His respiration is 18 and oxygen saturation is 98%.  BMI: Estimated body mass index is 25.83 kg/m as calculated from the following:   Height as of this encounter: 5' 10 (1.778 m).   Weight as of this encounter: 180 lb (81.6 kg).  Last encounter: 04/06/2024. Last procedure: 06/22/2024  Reason for encounter: medication management.  The patient indicates doing well with current medication regimen.  No adverse reaction or side effects reported to medication.  The patient is experiencing bilateral lower back pain, likely due to lumbar facet arthropathy.  However, he is currently not in significant pain and his symptoms are well-managed with prescribed oxycodone .   Discussed the use of AI scribe software for clinical note transcription with the patient, who gave verbal consent to proceed.  History of Present Illness   Derek [name] is a 43 year old male who presents with left leg pain following an racz procedure.  He experiences left leg pain radiating down to the knee, which previously extended further down the leg. The pain has improved since the racz procedure on June 22, 2024, but discomfort persists. His pain level is currently about a 2, though it was more severe over the weekend.  He is taking oxycodone  every six hours for pain management without side effects such as constipation or dizziness.     Pharmacotherapy Assessment   Oxycodone  HCl 10 mg tablets every 6 hours as needed for pain.  MME=60 Monitoring: [place] PMP: PDMP reviewed during this encounter.       Pharmacotherapy: No side-effects or adverse reactions reported. Compliance: No problems identified. Effectiveness: Clinically acceptable.  Derek [name], Derek [name]  07/03/2024  8:08 AM  Sign when Signing Visit Nursing Pain Medication Assessment:  Safety precautions to be maintained throughout the outpatient stay will include: orient to surroundings, keep bed in low position, maintain call bell within reach at all times, provide assistance with transfer out of bed and ambulation.  Medication Inspection Compliance: Pill count conducted under aseptic conditions, in front of the patient. Neither the pills nor the bottle was removed from the patient's sight at any time. Once count was completed pills were immediately returned to the patient in their original bottle.  Medication: Oxycodone  IR Pill/Patch Count: 25 of 120 pills/patches remain Pill/Patch Appearance: Markings consistent with prescribed medication Bottle Appearance: Standard pharmacy container. Clearly labeled. Filled Date: 78 / 06 / 2025 Last Medication intake:  Today    UDS:  Summary  Date Value Ref Range Status  01/10/2024 FINAL  Final    Comment:    ==================================================================== [employer] 13 (MW) ==================================================================== Test                             Result       Flag       Units  Drug Present and Declared for Prescription Verification   Oxycodone                       1906         EXPECTED   ng/mg creat   Oxymorphone                    4350         EXPECTED   ng/mg creat   Noroxycodone                   3038         EXPECTED   ng/mg creat   Noroxymorphone                 922          EXPECTED   ng/mg creat    Sources of oxycodone  are scheduled prescription medications.    Oxymorphone, noroxycodone, and noroxymorphone are expected    metabolites of oxycodone .  Oxymorphone is also available as a    scheduled prescription medication.  ==================================================================== Test                      Result    Flag   Units      Ref Range   Creatinine              109              mg/dL      >=79 ==================================================================== Declared Medications:  The flagging and interpretation on this report are based on the  following declared medications.  Unexpected results may arise from  inaccuracies in the declared medications.   **Note: The testing scope of this panel includes these medications:   Oxycodone    **Note: The testing scope of this panel does not include the  following reported medications:   Calcium   Celecoxib (Celebrex)  Cholecalciferol  Cyclobenzaprine   Esomeprazole (Nexium)  Naloxone   Tizanidine  ==================================================================== For clinical consultation, please call [phone]. ====================================================================     No  results found for: CBDTHCR No results found for: D8THCCBX No results found for: D9THCCBX  ROS  Constitutional: Denies any fever or chills Gastrointestinal: No reported hemesis, hematochezia, vomiting, or acute GI distress Musculoskeletal: left leg pain  Neurological: No reported episodes of acute onset apraxia, aphasia, dysarthria, agnosia, amnesia, paralysis, loss of coordination, or loss of consciousness  Medication Review  Calcium  Carb-Cholecalciferol, Diclofenac Sodium, Oxycodone  HCl, celecoxib, cyclobenzaprine , esomeprazole, hydrOXYzine, naloxone , and tiZANidine   History Review  Allergy: Derek [name] is allergic to hydrocodone  and amoxicillin. Drug: Derek [name]  reports no history of drug use. Alcohol:  reports current alcohol use. Tobacco:  reports that he has been smoking cigarettes. He has a 30 pack-year smoking history. He has quit using smokeless  tobacco. Social: Derek [name]  reports that he has been smoking cigarettes. He has a 30 pack-year smoking history. He has quit using smokeless tobacco. He reports current alcohol use. He reports that he does not use drugs. Medical:  has a past medical history of Anxiety, Arthritis, and GERD (gastroesophageal reflux disease). Surgical: Derek [name]  has a past surgical history that includes Back surgery; HAND REIMPLANTED; and Lumbar fusion (11/14). Family: family history is not on file.  Laboratory Chemistry Profile   Renal Lab Results  Component Value Date   BUN 14 03/17/2024   CREATININE 0.92 03/17/2024   BCR 10 01/16/2019   GFRAA 99 01/16/2019   GFRNONAA >60 03/17/2024    Hepatic Lab Results  Component Value Date   AST 28 03/17/2024   ALT 20 03/17/2024   ALBUMIN 3.7 03/17/2024   ALKPHOS 86 03/17/2024   HCVAB NON REACTIVE 03/17/2024    Electrolytes Lab Results  Component Value Date   NA 133 (L) 03/17/2024   K 3.7 03/17/2024   CL 107 03/17/2024   CALCIUM  8.9 03/17/2024   MG 2.0 01/16/2019    Bone Lab Results  Component Value Date   25OHVITD1 39 01/16/2019   25OHVITD2 1.2 01/16/2019   25OHVITD3 38 01/16/2019    Inflammation (CRP: Acute Phase) (ESR: Chronic Phase) Lab Results  Component Value Date   CRP 4 01/16/2019   ESRSEDRATE 29 (H) 01/16/2019         Note: Above Lab results reviewed.  Recent Imaging Review  DG PAIN CLINIC C-ARM 1-60 MIN NO REPORT Fluoro was used, but no Radiologist interpretation will be provided.  Please refer to NOTES tab for provider progress note. Note: Reviewed        Physical Exam  Vitals: BP 123/75 (BP Location: Right Arm, Patient Position: Sitting, Cuff Size: Normal)   Pulse 95   Temp (!) 97.3 F (36.3 C) (Temporal)   Resp 18   Ht 5' 10 (1.778 m)   Wt 180 lb (81.6 kg)   SpO2 98%   BMI 25.83 kg/m  BMI: Estimated body mass index is 25.83 kg/m as calculated from the following:   Height as of this encounter: 5' 10 (1.778 m).    Weight as of this encounter: 180 lb (81.6 kg). Ideal: Ideal body weight: 73 kg (160 lb 15 oz) Adjusted ideal body weight: 76.5 kg (168 lb 9 oz) General appearance: Well nourished, well developed, and well hydrated. In no apparent acute distress Mental status: Alert, oriented x 3 (person, place, & time)       Respiratory: No evidence of acute respiratory distress Eyes: PERLA  Musculoskeletal: +LBP Left leg pain Assessment   Diagnosis Status  1. Lumbar pseudoarthrosis (L5-S1)   2. Medication management   3. Chronic low  back pain (1ry area of Pain) (Bilateral) (L>R) w/ sciatica (Bilateral)   4. Failed back surgical syndrome (x3)   5. Radicular pain of lumbosacral region   6. Lumbar postlaminectomy syndrome   7. Chronic pain syndrome   8. Chronic use of opiate for therapeutic purpose    Controlled Controlled Controlled   Updated Problems: Problem  Medication Management    Plan of Care  Problem-specific:  Assessment and Plan    Chronic low back pain with left-sided sciatica Pain improved post racz procedure, now localized to left leg, pain level 2/10. No adverse effects from oxycodone . - Continue oxycodone  every six hours as needed.  Chronic pain syndrome Managed with oxycodone , no side effects reported. Urine drug screening consistent with prescribed medication. - Sent oxycodone  prescription for three months with refills on January 5th, February 4th, and March 6th.  Chronic opioid therapy Ongoing with oxycodone , no adverse reactions. Narcan  supply adequate until April 2026. - Continue current opioid therapy regimen. - Ensure Narcan  supply is adequate until April 2026.  Patient's pain is controlled with oxycodone , will continue on current medication regimen.  Prescribing drug monitoring (PMP) reviewed; findings consistent with the use of prescribed medication and no evidence of narcotic misuse or abuse.  Urine drug screening (UDS) up-to-date.  Schedule follow-up in 90 days  for medication management.      Derek [name] has a current medication list which includes the following long-term medication(s): calcium  carb-cholecalciferol, esomeprazole, tizanidine , [START ON 07/10/2024] oxycodone  hcl, [START ON 08/09/2024] oxycodone  hcl, and [START ON 09/08/2024] oxycodone  hcl.  Pharmacotherapy (Medications Ordered): Meds ordered this encounter  Medications   Oxycodone  HCl 10 MG TABS    Sig: Take 1 tablet (10 mg total) by mouth every 6 (six) hours as needed. Must last 30 days    Dispense:  120 tablet    Refill:  0    DO NOT: delete (not duplicate); no partial-fill (will deny script to complete), no refill request (F/U required). DISPENSE: 1 day early if closed on fill date. WARN: No CNS-depressants within 8 hrs of med.   Oxycodone  HCl 10 MG TABS    Sig: Take 1 tablet (10 mg total) by mouth every 6 (six) hours as needed. Must last 30 days    Dispense:  120 tablet    Refill:  0    DO NOT: delete (not duplicate); no partial-fill (will deny script to complete), no refill request (F/U required). DISPENSE: 1 day early if closed on fill date. WARN: No CNS-depressants within 8 hrs of med.   Oxycodone  HCl 10 MG TABS    Sig: Take 1 tablet (10 mg total) by mouth every 6 (six) hours as needed. Must last 30 days    Dispense:  120 tablet    Refill:  0    DO NOT: delete (not duplicate); no partial-fill (will deny script to complete), no refill request (F/U required). DISPENSE: 1 day early if closed on fill date. WARN: No CNS-depressants within 8 hrs of med.   Orders:  No orders of the defined types were placed in this encounter.     Return in about 3 months (around 10/01/2024) for (F2F), (MM), Emmy Blanch NP.    Recent Visits Date Type Provider Dept  06/22/24 Procedure visit Tanya Glisson, MD [facility]  04/06/24 Office Visit Mckinsey Keagle K, NP [facility]  Showing recent visits within past 90 days and meeting all other requirements Today's  Visits Date Type Provider Dept  07/03/24 Office  Visit Clevland Cork K, NP [facility]  Showing today's visits and meeting all other requirements Future Appointments Date Type Provider Dept  07/19/24 Appointment Tanya Glisson, MD [facility]  Showing future appointments within next 90 days and meeting all other requirements  I discussed the assessment and treatment plan with the patient. The patient was provided an opportunity to ask questions and all were answered. The patient agreed with the plan and demonstrated an understanding of the instructions.  Patient advised to call back or seek an in-person evaluation if the symptoms or condition worsens.  I personally spent a total of 30 minutes in the care of the patient today including preparing to see the patient, getting/reviewing separately obtained history, performing a medically appropriate exam/evaluation, counseling and educating, placing orders, referring and communicating with other health care professionals, documenting clinical information in the EHR, independently interpreting results, communicating results, and coordinating care.   Note by: Darris Staiger K Valisha Heslin, NP (TTS and AI technology used. I apologize for any typographical errors that were not detected and corrected.) Date: 07/03/2024; Time: 8:21 AM

## 2024-07-03 ENCOUNTER — Encounter: Payer: Self-pay | Admitting: Nurse Practitioner

## 2024-07-03 ENCOUNTER — Ambulatory Visit: Attending: Nurse Practitioner | Admitting: Nurse Practitioner

## 2024-07-03 VITALS — BP 123/75 | HR 95 | Temp 97.3°F | Resp 18 | Ht 70.0 in | Wt 180.0 lb

## 2024-07-03 DIAGNOSIS — Z79899 Other long term (current) drug therapy: Secondary | ICD-10-CM | POA: Diagnosis not present

## 2024-07-03 DIAGNOSIS — G894 Chronic pain syndrome: Secondary | ICD-10-CM | POA: Diagnosis not present

## 2024-07-03 DIAGNOSIS — M961 Postlaminectomy syndrome, not elsewhere classified: Secondary | ICD-10-CM | POA: Diagnosis not present

## 2024-07-03 DIAGNOSIS — Z79891 Long term (current) use of opiate analgesic: Secondary | ICD-10-CM | POA: Insufficient documentation

## 2024-07-03 DIAGNOSIS — M5442 Lumbago with sciatica, left side: Secondary | ICD-10-CM | POA: Insufficient documentation

## 2024-07-03 DIAGNOSIS — G8929 Other chronic pain: Secondary | ICD-10-CM | POA: Diagnosis present

## 2024-07-03 DIAGNOSIS — M5441 Lumbago with sciatica, right side: Secondary | ICD-10-CM | POA: Diagnosis not present

## 2024-07-03 DIAGNOSIS — M5417 Radiculopathy, lumbosacral region: Secondary | ICD-10-CM | POA: Diagnosis not present

## 2024-07-03 DIAGNOSIS — S32009K Unspecified fracture of unspecified lumbar vertebra, subsequent encounter for fracture with nonunion: Secondary | ICD-10-CM | POA: Diagnosis not present

## 2024-07-03 MED ORDER — OXYCODONE HCL 10 MG PO TABS: 10.0000 mg | ORAL_TABLET | Freq: Four times a day (QID) | ORAL | 0 refills | Status: AC | PRN

## 2024-07-03 NOTE — Progress Notes (Signed)
 Nursing Pain Medication Assessment:  Safety precautions to be maintained throughout the outpatient stay will include: orient to surroundings, keep bed in low position, maintain call bell within reach at all times, provide assistance with transfer out of bed and ambulation.  Medication Inspection Compliance: Pill count conducted under aseptic conditions, in front of the patient. Neither the pills nor the bottle was removed from the patient's sight at any time. Once count was completed pills were immediately returned to the patient in their original bottle.  Medication: Oxycodone  IR Pill/Patch Count: 25 of 120 pills/patches remain Pill/Patch Appearance: Markings consistent with prescribed medication Bottle Appearance: Standard pharmacy container. Clearly labeled. Filled Date: 86 / 06 / 2025 Last Medication intake:  Today

## 2024-07-03 NOTE — Patient Instructions (Signed)

## 2024-07-19 ENCOUNTER — Ambulatory Visit: Admitting: Pain Medicine

## 2024-08-02 ENCOUNTER — Encounter: Payer: Self-pay | Admitting: Pain Medicine

## 2024-08-02 ENCOUNTER — Ambulatory Visit: Attending: Pain Medicine | Admitting: Pain Medicine

## 2024-08-02 VITALS — BP 126/74 | HR 92 | Temp 97.9°F | Resp 14 | Ht 70.0 in | Wt 175.0 lb

## 2024-08-02 DIAGNOSIS — M79604 Pain in right leg: Secondary | ICD-10-CM | POA: Diagnosis present

## 2024-08-02 DIAGNOSIS — T85848S Pain due to other internal prosthetic devices, implants and grafts, sequela: Secondary | ICD-10-CM | POA: Diagnosis present

## 2024-08-02 DIAGNOSIS — M5417 Radiculopathy, lumbosacral region: Secondary | ICD-10-CM | POA: Insufficient documentation

## 2024-08-02 DIAGNOSIS — M961 Postlaminectomy syndrome, not elsewhere classified: Secondary | ICD-10-CM | POA: Diagnosis present

## 2024-08-02 DIAGNOSIS — M545 Low back pain, unspecified: Secondary | ICD-10-CM | POA: Insufficient documentation

## 2024-08-02 DIAGNOSIS — G8929 Other chronic pain: Secondary | ICD-10-CM | POA: Insufficient documentation

## 2024-08-02 DIAGNOSIS — M5442 Lumbago with sciatica, left side: Secondary | ICD-10-CM | POA: Insufficient documentation

## 2024-08-02 DIAGNOSIS — M5441 Lumbago with sciatica, right side: Secondary | ICD-10-CM | POA: Insufficient documentation

## 2024-08-02 DIAGNOSIS — M79605 Pain in left leg: Secondary | ICD-10-CM | POA: Diagnosis present

## 2024-08-02 DIAGNOSIS — Z09 Encounter for follow-up examination after completed treatment for conditions other than malignant neoplasm: Secondary | ICD-10-CM | POA: Diagnosis present

## 2024-08-02 NOTE — Progress Notes (Signed)
 PROVIDER NOTE: Interpretation of information contained herein should be left to medically-trained personnel. Specific patient instructions are provided elsewhere under Patient Instructions section of medical record. This document was created in part using AI and STT-dictation technology, any transcriptional errors that may result from this process are unintentional.  Patient: Derek [name]  Service: E/M   PCP: Cyrus Selinda Moose, PA-C  DOB: 22-Sep-1980  DOS: 08/02/2024  Provider: Eric DELENA Como, MD  MRN: [id number]  Delivery: Face-to-face  Specialty: Interventional Pain Management  Type: Established Patient  Setting: Ambulatory outpatient facility  Specialty designation: 09  Referring Prov.: Cyrus, Selinda Moose, PA-C  Location: Outpatient office facility       History of present illness (HPI) Derek [name], a 44 y.o. year old male, is here today because of his Chronic bilateral low back pain with bilateral sciatica [M54.42, M54.41, G89.29]. Derek [name] primary complain today is Back Pain  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); Somatic dysfunction of sacroiliac joints (Bilateral); Chronic hip pain (Bilateral); Epidural fibrosis; Lumbar postlaminectomy syndrome; DDD (degenerative disc disease), lumbosacral; Lumbar facet syndrome (Bilateral) (L>R); Other specified dorsopathies, sacral and sacrococcygeal region; Spondylosis without myelopathy or radiculopathy, lumbosacral region; Neurogenic pain; Osteoarthritic spondylosis of lumbar spine; Chronic low back pain (Left) w/o sciatica; Numbness of  anterior thigh (Right); Burning pain in thigh (Right); Chronic low back pain (Midline) w/o sciatica; Enthesopathy of elbow region (Bilateral); Sprain of lateral collateral ligament of elbow, sequela (Left); Sprain of lateral collateral ligament of elbow, sequela (Right); Radicular pain of lumbosacral region; Lumbosacral radiculopathy; Chronic low back pain (Bilateral) w/o sciatica; Lumbar facet joint pain; Pain from implanted hardware; Low back pain of over 3 months duration; Intermittent low back pain; Mechanical low back pain; Intractable low back pain; Multifactorial low back pain; and Recurrent low back pain on their pertinent problem list.  Pain Assessment: Severity of Chronic pain is reported as a 6 /10. Location: Back Lower, Right, Left/Denies. Onset: More than a month ago. Quality: Aching, Throbbing, Constant. Timing: Constant. Modifying factor(s): Procedures. Vitals:  height is 5' 10 (1.778 m) and weight is 175 lb (79.4 kg). His temporal temperature is 97.9 F (36.6 C). His blood pressure is 126/74 and his pulse is 92. His respiration is 14 and oxygen saturation is 98%.  BMI: Estimated body mass index is 25.11 kg/m as calculated from the following:   Height as of this encounter: 5' 10 (1.778 m).   Weight as of this encounter: 175 lb (79.4 kg).  Last encounter: 02/07/2024. Last procedure: 06/22/2024.  Reason for encounter: post-procedure evaluation and assessment.   Discussed the use of AI scribe software for clinical note transcription with the patient, who gave verbal consent to proceed.  History of Present Illness   A 44 year old male presents for follow-up evaluation after a palliative ED&C procedure.  He has had complete and immediate relief of lower extremity pain since the palliative ED&C with local anesthetic, and the pain relief is ongoing.  He continues to have bilateral lower back pain. A prior hardware block significantly improved this pain.  He is living in very cold  conditions with frozen pipes and limited water , which makes basic hygiene and daily self-care difficult.  Post-Procedure Evaluation    Procedure:          Anesthesia, Analgesia, Anxiolysis:  Type: Therapeutic Percutaneous Epidural Neuroplasty and Lysis of Adhesions (RACZ Procedure)  #11 (NO Hypertonic) Region: Caudal Level: Sacrococcygeal   Laterality: Midline aiming at the left  Anesthesia: Local (1-2% Lidocaine )  Anxiolysis: IV  Sedation: Moderate  Guidance: Fluoroscopy Spinal ([id number])   Position: Prone   1. Chronic low back pain (1ry area of Pain) (Bilateral) (L>R) w/ sciatica (Bilateral)   2. Chronic lower extremity pain (2ry area of Pain) (Bilateral) (L>R)   3. Failed back surgical syndrome (x3)   4. L5-S1 pseudoarthrosis   5. Low back pain of over 3 months duration   6. Epidural fibrosis   7. Radicular pain of lumbosacral region    NAS-11 Pain score:   Pre-procedure: 4 /10   Post-procedure: 0-No pain/10   Note: Pt. did get some saddle & LE numbness & weakness. No hypertonic injected since results suggested some intrathecal spread. Kept under observation until numbness & weakness improved.    Effectiveness:  Initial hour after procedure: 100 %. Subsequent 4-6 hours post-procedure: 100 %. Analgesia past initial 6 hours: 50 % (Pain no longer radaites to legs). Ongoing improvement:  Analgesic: The patient indicates having attained 100% relief of the pain for the duration of the local anesthetic followed by a decrease to a 50% relief of the pain in the lower back and an ongoing 100% relief of his lower extremity radiculitis. Function: Derek [name] reports improvement in function ROM: Derek [name] reports improvement in ROM Interpretation: Based on the above information, clearly the procedure continues to be beneficial for the treatment of the patient's intermittent radiculitis associated with his failed back surgical syndrome.  He still continues to have low back pain, but  this is secondary to his lumbar hardware which we treat differently by way of bilateral lumbar hardware injections.  The patient has requested to have the scheduled as soon as possible.  Pharmacotherapy Assessment   Opioid Analgesia: Oxycodone  IR 10 mg, 1 tab PO q 6 hrs (40 mg/day of oxycodone ) MME/day: 60 mg/day.   Monitoring: [place] PMP: PDMP reviewed during this encounter.       Pharmacotherapy: No side-effects or adverse reactions reported. Compliance: No problems identified. Effectiveness: Clinically acceptable.  Bonner Norris, RN  08/02/2024  8:16 AM  Sign when Signing Visit Safety precautions to be maintained throughout the outpatient stay will include: orient to surroundings, keep bed in low position, maintain call bell within reach at all times, provide assistance with transfer out of bed and ambulation.     UDS:  Summary  Date Value Ref Range Status  01/10/2024 FINAL  Final    Comment:    ==================================================================== [employer] [facility] [employer] (MW) ==================================================================== Test                             Result       Flag       Units  Drug Present and Declared for Prescription Verification   Oxycodone                       1906         EXPECTED   ng/mg creat   Oxymorphone                    4350         EXPECTED   ng/mg creat  Noroxycodone                   3038         EXPECTED   ng/mg creat   Noroxymorphone                 922          EXPECTED   ng/mg creat    Sources of oxycodone  are scheduled prescription medications.    Oxymorphone, noroxycodone, and noroxymorphone are expected    metabolites of oxycodone . Oxymorphone is also available as a    scheduled prescription medication.  ==================================================================== Test                      Result    Flag   Units      Ref Range   Creatinine              109              mg/dL       >=79 ==================================================================== Declared Medications:  The flagging and interpretation on this report are based on the  following declared medications.  Unexpected results may arise from  inaccuracies in the declared medications.   **Note: The testing scope of this panel includes these medications:   Oxycodone    **Note: The testing scope of this panel does not include the  following reported medications:   Calcium   Celecoxib (Celebrex)  Cholecalciferol  Cyclobenzaprine   Esomeprazole (Nexium)  Naloxone   Tizanidine  ==================================================================== For clinical consultation, please call [phone]. ====================================================================     No results found for: CBDTHCR No results found for: D8THCCBX No results found for: D9THCCBX  ROS  Constitutional: Denies any fever or chills Gastrointestinal: No reported hemesis, hematochezia, vomiting, or acute GI distress Musculoskeletal: Denies any acute onset joint swelling, redness, loss of ROM, or weakness Neurological: No reported episodes of acute onset apraxia, aphasia, dysarthria, agnosia, amnesia, paralysis, loss of coordination, or loss of consciousness  Medication Review  Calcium  Carb-Cholecalciferol, Diclofenac Sodium, Oxycodone  HCl, celecoxib, cyclobenzaprine , esomeprazole, hydrOXYzine, naloxone , and tiZANidine   History Review  Allergy: Derek [name] is allergic to hydrocodone  and amoxicillin. Drug: Derek [name]  reports no history of drug use. Alcohol:  reports current alcohol use. Tobacco:  reports that he has been smoking cigarettes. He has a 30 pack-year smoking history. He has quit using smokeless tobacco. Social: Derek [name]  reports that he has been smoking cigarettes. He has a 30 pack-year smoking history. He has quit using smokeless tobacco. He reports current alcohol use. He reports that he does not use  drugs. Medical:  has a past medical history of Anxiety, Arthritis, and GERD (gastroesophageal reflux disease). Surgical: Mr. Bright  has a past surgical history that includes Back surgery; HAND REIMPLANTED; and Lumbar fusion (11/14). Family: family history is not on file.  Laboratory Chemistry Profile   Renal Lab Results  Component Value Date   BUN 14 03/17/2024   CREATININE 0.92 03/17/2024   BCR 10 01/16/2019   GFRAA 99 01/16/2019   GFRNONAA >60 03/17/2024    Hepatic Lab Results  Component Value Date   AST 28 03/17/2024   ALT 20 03/17/2024   ALBUMIN 3.7 03/17/2024   ALKPHOS 86 03/17/2024   HCVAB NON REACTIVE 03/17/2024    Electrolytes Lab Results  Component Value Date   NA 133 (L) 03/17/2024   K 3.7 03/17/2024   CL 107 03/17/2024   CALCIUM  8.9 03/17/2024   MG 2.0 01/16/2019  Bone Lab Results  Component Value Date   25OHVITD1 39 01/16/2019   25OHVITD2 1.2 01/16/2019   25OHVITD3 38 01/16/2019    Inflammation (CRP: Acute Phase) (ESR: Chronic Phase) Lab Results  Component Value Date   CRP 4 01/16/2019   ESRSEDRATE 29 (H) 01/16/2019         Note: Above Lab results reviewed.  Recent Imaging Review  DG PAIN CLINIC C-ARM 1-60 MIN NO REPORT Fluoro was used, but no Radiologist interpretation will be provided.  Please refer to NOTES tab for provider progress note. Note: Reviewed        Physical Exam  Vitals: BP 126/74   Pulse 92   Temp 97.9 F (36.6 C) (Temporal)   Resp 14   Ht 5' 10 (1.778 m)   Wt 175 lb (79.4 kg)   SpO2 98%   BMI 25.11 kg/m  BMI: Estimated body mass index is 25.11 kg/m as calculated from the following:   Height as of this encounter: 5' 10 (1.778 m).   Weight as of this encounter: 175 lb (79.4 kg). Ideal: Ideal body weight: 73 kg (160 lb 15 oz) Adjusted ideal body weight: 75.6 kg (166 lb 9 oz) General appearance: Well nourished, well developed, and well hydrated. In no apparent acute distress Mental status: Alert, oriented x 3  (person, place, & time)       Respiratory: No evidence of acute respiratory distress Eyes: PERLA   Assessment   Diagnosis Status  1. Chronic low back pain (1ry area of Pain) (Bilateral) (L>R) w/ sciatica (Bilateral)   2. Radicular pain of lumbosacral region   3. Chronic lower extremity pain (2ry area of Pain) (Bilateral) (L>R)   4. Failed back surgical syndrome (x3)   5. Postop check   6. Chronic low back pain (Bilateral) w/o sciatica   7. Pain from implanted hardware, sequela    Controlled Controlled Controlled   Updated Problems: No problems updated.  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 Previous ED&C procedure relieved lower extremity pain completely, but lower back pain remains. Hardware block has been effective in the past for alleviating pain from lumbar hardware. Scheduled hardware block for lumbar spine.       Derek [name] has a current medication list which includes the following long-term medication(s): calcium  carb-cholecalciferol, esomeprazole, oxycodone  hcl, tizanidine , [START ON 08/09/2024] oxycodone  hcl, and [START ON 09/08/2024] oxycodone  hcl.  Pharmacotherapy (Medications Ordered): No orders of the defined types were placed in this encounter.  Orders:  Orders Placed This Encounter  Procedures   LUMBAR FACET(MEDIAL BRANCH NERVE BLOCK) MBNB    Diagnosis: Lumbar Facet Syndrome (M47.816); Lumbosacral Facet Syndrome (M47.817); Lumbar Facet Joint Pain (M54.59) Medical Necessity Statement: 1.Severe chronic axial low back pain causing functional impairment documented by ongoing pain scale assessments. 2.Pain present for longer than 3 months (Chronic) documented to have failed noninvasive conservative therapies. 3.Absence of untreated radiculopathy. 4.There is no radiological evidence of untreated fractures,  tumor, infection, or deformity.  Physical Examination Findings: Positive Kemp Maneuver: (Y)  Positive Lumbar Hyperextension-Rotation provocative test: (Y)    Standing Status:   Future    Expiration Date:   10/31/2024    Scheduling Instructions:     Procedure: Lumbar facet Block     Type: Medial Branch Block     Side: Bilateral     Purpose: Therapeutic  Level(s): L3-4, L4-5, and L5-S1 Facets (L2, L3, L4, L5, and S1 Medial Branch)     Procedural Analgesia/Anxiolysis: Patient's choice     Timeframe: As soon as schedule allows.    Where will this procedure be performed?:   [facility]   Nursing Instructions:    Please complete this patient's postprocedure evaluation.    Scheduling Instructions:     Please complete this patient's postprocedure evaluation.     Interventional Therapies  Risk Factors  Considerations:      Lumbar Hardware: No RFA    Planned  Pending:      Under consideration:   Diagnostic x-rays of both hips  Diagnostic/therapeutic bilateral IA hip joint injection #1  Possible spinal cord stimulator trial    Completed:   Therapeutic right L2-3 LESI x1 (08/29/2020) (0/0/75/> 75)  Diagnostic left lumbar facet MBB x2 (01/15/2022) (100/100/75/75)  Diagnostic right lumbar facet MBB x1 (01/15/2022) (100/100/75/75)  Therapeutic bilateral lumbar hardware Block/lumbar facet MBB R2L3 (01/20/2024) (90/90/90/90) (for LBP) Diagnostic left SI Blk x1 (03/21/2020) (100/100/90 x 1.5 weeks)  Therapeutic/palliative right lateral epicondyle (elbow) inj. x2 (01/21/2021) (0/0/95/95)  Therapeutic/palliative left RACZ procedure x10 (targeting the left S2, S3 area) (10/19/2023) (100/100/75/75) (for LEP)  Therapeutic left RACZ procedure #8 (10/01/2022) (+ saddle block - NO HYPERTONIC injected) (100/100/90/95)    Therapeutic  Palliative (PRN) options:   Palliative right lateral epicondyle (elbow) inj. #2  Palliative left RACZ procedure #8 (targeting the left S2, S3 area)     Pharmacotherapy  Nonopioids transferred 05/02/2020: Zanaflex , Flexeril , and Mobic       Return for (Clinic): (B) L-hardware Block.    Recent Visits Date Type Provider Dept  07/03/24 Office Visit Patel, Seema K, NP [facility]  06/22/24 Procedure visit Tanya Glisson, MD [facility]  Showing recent visits within past 90 days and meeting all other requirements Today's Visits Date Type Provider Dept  08/02/24 Office Visit Tanya Glisson, MD [facility]  Showing today's visits and meeting all other requirements Future Appointments Date Type Provider Dept  09/20/24 Appointment Patel, Seema K, NP [facility]  Showing future appointments within next 90 days and meeting all other requirements  I discussed the assessment and treatment plan with the patient. The patient was provided an opportunity to ask questions and all were answered. The patient agreed with the plan and demonstrated an understanding of the instructions.  Patient advised to call back or seek an in-person evaluation if the symptoms or condition worsens.  Duration of encounter: 30 minutes.  Total time on encounter, as per AMA guidelines included both the face-to-face and non-face-to-face time personally spent by the physician and/or other qualified health care professional(s) on the day of the encounter (includes time in activities that require the physician or other qualified health care professional and does not include time in activities normally performed by clinical staff). Physician's time may include the following activities when performed: Preparing to see the patient (e.g., pre-charting review of records, searching for previously ordered imaging, lab work, and nerve conduction tests) Review of prior analgesic pharmacotherapies. Reviewing PMP Interpreting ordered tests (e.g., lab work, imaging, nerve conduction tests) Performing post-procedure evaluations, including  interpretation of diagnostic procedures Obtaining and/or reviewing separately obtained history Performing a medically appropriate examination and/or evaluation Counseling and educating the patient/family/caregiver Ordering medications, tests, or procedures Referring and communicating with other health care professionals (when not separately reported) Documenting clinical information in the electronic or other health record Independently interpreting results (not separately reported)  and communicating results to the patient/ family/caregiver Care coordination (not separately reported)  Note by: Eric DELENA Como, MD (TTS and AI technology used. I apologize for any typographical errors that were not detected and corrected.) Date: 08/02/2024; Time: 8:55 AM

## 2024-08-02 NOTE — Patient Instructions (Signed)
 " ______________________________________________________________________    Procedure instructions  Stop blood-thinners  Do not eat or drink fluids (other than water ) for 8 hours before your procedure  No water  for 2 hours before your procedure  Take your blood pressure medicine with a sip of water   Arrive 30 minutes before your appointment  If sedation is planned, bring suitable driver. [place], [place], & public transportation are NOT APPROVED)  Carefully read the Preparing for your procedure detailed instructions  If you have questions call us  at [phone]  Procedure appointments are for procedures only.   NO medication refills or new problem evaluations will be done on procedure days.   Only the scheduled, pre-approved procedure and side will be done.   ______________________________________________________________________     ______________________________________________________________________    Preparing for your procedure  Appointments: If you think you may not be able to keep your appointment, call 24-48 hours in advance to cancel. We need time to make it available to others.  Procedure visits are for procedures only. During your procedure appointment there will be: NO Prescription Refills*. NO medication changes or discussions*. NO discussion of disability issues*. NO unrelated pain problem evaluations*. NO evaluations to order other pain procedures*. *These will be addressed at a separate and distinct evaluation encounter on the provider's evaluation schedule and not during procedure days.  Instructions: Food intake: Avoid eating anything solid for at least 8 hours prior to your procedure. Clear liquid intake: You may take clear liquids such as water  up to 2 hours prior to your procedure. (No carbonated drinks. No soda.) Transportation: Unless otherwise stated by your physician, bring a driver. (Driver cannot be a [employer]earcher, Pharmacist, Community, or any other form of public  transportation.) Morning Medicines: Except for blood thinners, take all of your other morning medications with a sip of water . Make sure to take your heart and blood pressure medicines. If your blood pressure's lower number is above 100, the case will be rescheduled. Blood thinners: Make sure to stop your blood thinners as instructed.  If you take a blood thinner, but were not instructed to stop it, call our office [phone] and ask to talk to a nurse. Not stopping a blood thinner prior to certain procedures could lead to serious complications. Diabetics on insulin: Notify the staff so that you can be scheduled 1st case in the morning. If your diabetes requires high dose insulin, take only  of your normal insulin dose the morning of the procedure and notify the staff that you have done so. Preventing infections: Shower with an antibacterial soap the morning of your procedure.  Build-up your immune system: Take 1000 mg of Vitamin C with every meal (3 times a day) the day prior to your procedure. Antibiotics: Inform the nursing staff if you are taking any antibiotics or if you have any conditions that may require antibiotics prior to procedures. (Example: recent joint implants)   Pregnancy: If you are pregnant make sure to notify the nursing staff. Not doing so may result in injury to the fetus, including death.  Sickness: If you have a cold, fever, or any active infections, call and cancel or reschedule your procedure. Receiving steroids while having an infection may result in complications. Arrival: You must be in the facility at least 30 minutes prior to your scheduled procedure. Tardiness: Your scheduled time is also the cutoff time. If you do not arrive at least 15 minutes prior to your procedure, you will be rescheduled.  Children: Do not bring any children  with you. Make arrangements to keep them home. Dress appropriately: There is always a possibility that your clothing may get soiled. Avoid  long dresses. Valuables: Do not bring any jewelry or valuables.  Reasons to call and reschedule or cancel your procedure: (Following these recommendations will minimize the risk of a serious complication.) Surgeries: Avoid having procedures within 2 weeks of any surgery. (Avoid for 2 weeks before or after any surgery). Flu Shots: Avoid having procedures within 2 weeks of a flu shots or . (Avoid for 2 weeks before or after immunizations). Barium: Avoid having a procedure within 7-10 days after having had a radiological study involving the use of radiological contrast. (Myelograms, Barium swallow or enema study). Heart attacks: Avoid any elective procedures or surgeries for the initial 6 months after a Myocardial Infarction (Heart Attack). Blood thinners: It is imperative that you stop these medications before procedures. Let us  know if you if you take any blood thinner.  Infection: Avoid procedures during or within two weeks of an infection (including chest colds or gastrointestinal problems). Symptoms associated with infections include: Localized redness, fever, chills, night sweats or profuse sweating, burning sensation when voiding, cough, congestion, stuffiness, runny nose, sore throat, diarrhea, nausea, vomiting, cold or Flu symptoms, recent or current infections. It is specially important if the infection is over the area that we intend to treat. Heart and lung problems: Symptoms that may suggest an active cardiopulmonary problem include: cough, chest pain, breathing difficulties or shortness of breath, dizziness, ankle swelling, uncontrolled high or unusually low blood pressure, and/or palpitations. If you are experiencing any of these symptoms, cancel your procedure and contact your primary care physician for an evaluation.  Remember:  Regular Business hours are:  Monday to Thursday 8:00 AM to 4:00 PM  Provider's Schedule: Eric Como, MD:  Procedure days: Tuesday and Thursday 7:30  AM to 4:00 PM  Wallie Sherry, MD:  Procedure days: Monday and Wednesday 7:30 AM to 4:00 PM Last  Updated: 06/15/2023 ______________________________________________________________________     ______________________________________________________________________    General Risks and Possible Complications  Patient Responsibilities: It is important that you read this as it is part of your informed consent. It is our duty to inform you of the risks and possible complications associated with treatments offered to you. It is your responsibility as a patient to read this and to ask questions about anything that is not clear or that you believe was not covered in this document.  Patients Rights: You have the right to refuse treatment. You also have the right to change your mind, even after initially having agreed to have the treatment done. However, under this last option, if you wait until the last second to change your mind, you may be charged for the materials used up to that point.  Introduction: Medicine is not an visual merchandiser. Everything in Medicine, including the lack of treatment(s), carries the potential for danger, harm, or loss (which is by definition: Risk). In Medicine, a complication is a secondary problem, condition, or disease that can aggravate an already existing one. All treatments carry the risk of possible complications. The fact that a side effects or complications occurs, does not imply that the treatment was conducted incorrectly. It must be clearly understood that these can happen even when everything is done following the highest safety standards.  No treatment: You can choose not to proceed with the proposed treatment alternative. The PRO(s) would include: avoiding the risk of complications associated with the therapy. The CON(s) would  include: not getting any of the treatment benefits. These benefits fall under one of three categories: diagnostic; therapeutic; and/or  palliative. Diagnostic benefits include: getting information which can ultimately lead to improvement of the disease or symptom(s). Therapeutic benefits are those associated with the successful treatment of the disease. Finally, palliative benefits are those related to the decrease of the primary symptoms, without necessarily curing the condition (example: decreasing the pain from a flare-up of a chronic condition, such as incurable terminal cancer).  General Risks and Complications: These are associated to most interventional treatments. They can occur alone, or in combination. They fall under one of the following six (6) categories: no benefit or worsening of symptoms; bleeding; infection; nerve damage; allergic reactions; and/or death. No benefits or worsening of symptoms: In Medicine there are no guarantees, only probabilities. No healthcare provider can ever guarantee that a medical treatment will work, they can only state the probability that it may. Furthermore, there is always the possibility that the condition may worsen, either directly, or indirectly, as a consequence of the treatment. Bleeding: This is more common if the patient is taking a blood thinner, either prescription or over the counter (example: Goody Powders, Fish oil, Aspirin , Garlic, etc.), or if suffering a condition associated with impaired coagulation (example: Hemophilia, cirrhosis of the liver, low platelet counts, etc.). However, even if you do not have one on these, it can still happen. If you have any of these conditions, or take one of these drugs, make sure to notify your treating physician. Infection: This is more common in patients with a compromised immune system, either due to disease (example: diabetes, cancer, human immunodeficiency virus [HIV], etc.), or due to medications or treatments (example: therapies used to treat cancer and rheumatological diseases). However, even if you do not have one on these, it can still  happen. If you have any of these conditions, or take one of these drugs, make sure to notify your treating physician. Nerve Damage: This is more common when the treatment is an invasive one, but it can also happen with the use of medications, such as those used in the treatment of cancer. The damage can occur to small secondary nerves, or to large primary ones, such as those in the spinal cord and brain. This damage may be temporary or permanent and it may lead to impairments that can range from temporary numbness to permanent paralysis and/or brain death. Allergic Reactions: Any time a substance or material comes in contact with our body, there is the possibility of an allergic reaction. These can range from a mild skin rash (contact dermatitis) to a severe systemic reaction (anaphylactic reaction), which can result in death. Death: In general, any medical intervention can result in death, most of the time due to an unforeseen complication. ______________________________________________________________________      ______________________________________________________________________    Steroid injections  Common steroids for injections Triamcinolone : Used by many sports medicine physicians for large joint and bursal injections, often combined with a local anesthetic like lidocaine . A study focusing on coccydynia (tailbone pain) found triamcinolone  was more effective than betamethasone, suggesting it may also be preferable for other localized inflammation conditions. Methylprednisolone: A common alternative to triamcinolone  that is also a strong anti-inflammatory. It is available in different formulations, with the acetate suspension being the long-acting option for intra-articular injections. Dexamethasone : This is a non-particulate steroid, meaning it has a lower risk of tissue damage compared to particulate steroids like triamcinolone  and methylprednisolone. While less common for this specific  use, it is an option for targeted injections.   Considerations for physicians Particulate vs. non-particulate steroids: Triamcinolone  and methylprednisolone are particulate, meaning they can clump together. Dexamethasone  is non-particulate. Particulate steroids are often preferred for their longer-lasting effects but carry a theoretical higher risk for certain injections (though this is less of a concern in the costochondral joints). Combined injectate: Corticosteroids are typically mixed with a local anesthetic like lidocaine  to provide both immediate pain relief (from the anesthetic) and longer-term inflammation reduction (from the steroid). Imaging guidance: To ensure accurate placement of the needle and medication, physicians may use ultrasound or fluoroscopic guidance for the injection, especially in complex or refractory cases.   Patient guidance Before undergoing a steroid injection, discuss the options with your physician. They will determine the best steroid, dosage, and procedure for your specific case based on factors like: Severity of your condition History of response to other treatments Your overall health status Experience and preference of the physician  Last  Updated: 02/29/2024 ______________________________________________________________________     "

## 2024-08-02 NOTE — Progress Notes (Signed)
 Safety precautions to be maintained throughout the outpatient stay will include: orient to surroundings, keep bed in low position, maintain call bell within reach at all times, provide assistance with transfer out of bed and ambulation.

## 2024-09-20 ENCOUNTER — Encounter: Admitting: Nurse Practitioner
